# Patient Record
Sex: Male | Born: 1965 | Race: White | Hispanic: No | Marital: Single | Smoking: Former smoker
Health system: Southern US, Community
[De-identification: ages and names within clinical notes are randomized; demographics above are authoritative.]

## PROBLEM LIST (undated history)

## (undated) DIAGNOSIS — I10 Essential (primary) hypertension: Secondary | ICD-10-CM

## (undated) DIAGNOSIS — J439 Emphysema, unspecified: Secondary | ICD-10-CM

## (undated) DIAGNOSIS — G4730 Sleep apnea, unspecified: Secondary | ICD-10-CM

## (undated) DIAGNOSIS — E785 Hyperlipidemia, unspecified: Secondary | ICD-10-CM

## (undated) DIAGNOSIS — I69320 Aphasia following cerebral infarction: Secondary | ICD-10-CM

## (undated) DIAGNOSIS — E119 Type 2 diabetes mellitus without complications: Secondary | ICD-10-CM

## (undated) DIAGNOSIS — I63512 Cerebral infarction due to unspecified occlusion or stenosis of left middle cerebral artery: Secondary | ICD-10-CM

## (undated) DIAGNOSIS — I6609 Occlusion and stenosis of unspecified middle cerebral artery: Secondary | ICD-10-CM

## (undated) HISTORY — DX: Hyperlipidemia, unspecified: E78.5

## (undated) HISTORY — DX: Type 2 diabetes mellitus without complications: E11.9

## (undated) HISTORY — PX: BACK SURGERY: SHX140

## (undated) HISTORY — PX: OTHER SURGICAL HISTORY: SHX169

## (undated) HISTORY — DX: Essential (primary) hypertension: I10

---

## 2021-05-07 ENCOUNTER — Emergency Department (HOSPITAL_COMMUNITY): Payer: BC Managed Care – PPO | Admitting: Anesthesiology

## 2021-05-07 ENCOUNTER — Emergency Department (HOSPITAL_COMMUNITY): Payer: BC Managed Care – PPO

## 2021-05-07 ENCOUNTER — Inpatient Hospital Stay (HOSPITAL_COMMUNITY)
Admission: EM | Admit: 2021-05-07 | Discharge: 2021-05-12 | DRG: 023 | Disposition: A | Discharge status: Rehabilitation Facility | Payer: BC Managed Care – PPO | Attending: Neurology | Admitting: Neurology

## 2021-05-07 ENCOUNTER — Encounter (HOSPITAL_COMMUNITY)
Admission: EM | Disposition: A | Discharge status: Rehabilitation Facility | Payer: Self-pay | Source: Home / Self Care | Attending: Neurology

## 2021-05-07 ENCOUNTER — Inpatient Hospital Stay (HOSPITAL_COMMUNITY): Payer: BC Managed Care – PPO

## 2021-05-07 ENCOUNTER — Encounter (HOSPITAL_COMMUNITY): Payer: Self-pay | Admitting: Neurology

## 2021-05-07 DIAGNOSIS — E876 Hypokalemia: Secondary | ICD-10-CM | POA: Diagnosis not present

## 2021-05-07 DIAGNOSIS — J159 Unspecified bacterial pneumonia: Secondary | ICD-10-CM | POA: Diagnosis not present

## 2021-05-07 DIAGNOSIS — Z6834 Body mass index (BMI) 34.0-34.9, adult: Secondary | ICD-10-CM

## 2021-05-07 DIAGNOSIS — N179 Acute kidney failure, unspecified: Secondary | ICD-10-CM | POA: Diagnosis not present

## 2021-05-07 DIAGNOSIS — Z20822 Contact with and (suspected) exposure to covid-19: Secondary | ICD-10-CM | POA: Diagnosis present

## 2021-05-07 DIAGNOSIS — E785 Hyperlipidemia, unspecified: Secondary | ICD-10-CM | POA: Diagnosis present

## 2021-05-07 DIAGNOSIS — Z79899 Other long term (current) drug therapy: Secondary | ICD-10-CM | POA: Diagnosis not present

## 2021-05-07 DIAGNOSIS — H547 Unspecified visual loss: Secondary | ICD-10-CM | POA: Diagnosis present

## 2021-05-07 DIAGNOSIS — E1165 Type 2 diabetes mellitus with hyperglycemia: Secondary | ICD-10-CM | POA: Diagnosis present

## 2021-05-07 DIAGNOSIS — I959 Hypotension, unspecified: Secondary | ICD-10-CM | POA: Diagnosis present

## 2021-05-07 DIAGNOSIS — I69351 Hemiplegia and hemiparesis following cerebral infarction affecting right dominant side: Secondary | ICD-10-CM | POA: Diagnosis not present

## 2021-05-07 DIAGNOSIS — I10 Essential (primary) hypertension: Secondary | ICD-10-CM | POA: Diagnosis present

## 2021-05-07 DIAGNOSIS — R29810 Facial weakness: Secondary | ICD-10-CM | POA: Diagnosis present

## 2021-05-07 DIAGNOSIS — E669 Obesity, unspecified: Secondary | ICD-10-CM | POA: Diagnosis present

## 2021-05-07 DIAGNOSIS — R1310 Dysphagia, unspecified: Secondary | ICD-10-CM | POA: Diagnosis present

## 2021-05-07 DIAGNOSIS — I63512 Cerebral infarction due to unspecified occlusion or stenosis of left middle cerebral artery: Secondary | ICD-10-CM | POA: Diagnosis present

## 2021-05-07 DIAGNOSIS — E8770 Fluid overload, unspecified: Secondary | ICD-10-CM | POA: Diagnosis present

## 2021-05-07 DIAGNOSIS — I63312 Cerebral infarction due to thrombosis of left middle cerebral artery: Secondary | ICD-10-CM | POA: Diagnosis present

## 2021-05-07 DIAGNOSIS — J9602 Acute respiratory failure with hypercapnia: Secondary | ICD-10-CM | POA: Diagnosis present

## 2021-05-07 DIAGNOSIS — R29706 NIHSS score 6: Secondary | ICD-10-CM | POA: Diagnosis present

## 2021-05-07 DIAGNOSIS — G936 Cerebral edema: Secondary | ICD-10-CM | POA: Diagnosis present

## 2021-05-07 DIAGNOSIS — I6602 Occlusion and stenosis of left middle cerebral artery: Secondary | ICD-10-CM | POA: Diagnosis not present

## 2021-05-07 DIAGNOSIS — J811 Chronic pulmonary edema: Secondary | ICD-10-CM

## 2021-05-07 DIAGNOSIS — J810 Acute pulmonary edema: Secondary | ICD-10-CM | POA: Diagnosis not present

## 2021-05-07 DIAGNOSIS — F17210 Nicotine dependence, cigarettes, uncomplicated: Secondary | ICD-10-CM | POA: Diagnosis present

## 2021-05-07 DIAGNOSIS — J9601 Acute respiratory failure with hypoxia: Secondary | ICD-10-CM | POA: Diagnosis present

## 2021-05-07 DIAGNOSIS — R4701 Aphasia: Secondary | ICD-10-CM | POA: Diagnosis present

## 2021-05-07 DIAGNOSIS — Z978 Presence of other specified devices: Secondary | ICD-10-CM

## 2021-05-07 DIAGNOSIS — J189 Pneumonia, unspecified organism: Secondary | ICD-10-CM | POA: Diagnosis not present

## 2021-05-07 DIAGNOSIS — R7989 Other specified abnormal findings of blood chemistry: Secondary | ICD-10-CM | POA: Diagnosis not present

## 2021-05-07 DIAGNOSIS — I639 Cerebral infarction, unspecified: Secondary | ICD-10-CM | POA: Diagnosis present

## 2021-05-07 DIAGNOSIS — I609 Nontraumatic subarachnoid hemorrhage, unspecified: Secondary | ICD-10-CM | POA: Diagnosis present

## 2021-05-07 HISTORY — PX: IR PERCUTANEOUS ART THROMBECTOMY/INFUSION INTRACRANIAL INC DIAG ANGIO: IMG6087

## 2021-05-07 HISTORY — PX: IR CT HEAD LTD: IMG2386

## 2021-05-07 HISTORY — PX: RADIOLOGY WITH ANESTHESIA: SHX6223

## 2021-05-07 LAB — I-STAT CHEM 8, ED
BUN: 18 mg/dL (ref 6–20)
Calcium, Ion: 1.04 mmol/L — ABNORMAL LOW (ref 1.15–1.40)
Chloride: 103 mmol/L (ref 98–111)
Creatinine, Ser: 1 mg/dL (ref 0.61–1.24)
Glucose, Bld: 187 mg/dL — ABNORMAL HIGH (ref 70–99)
HCT: 50 % (ref 39.0–52.0)
Hemoglobin: 17 g/dL (ref 13.0–17.0)
Potassium: 3.6 mmol/L (ref 3.5–5.1)
Sodium: 138 mmol/L (ref 135–145)
TCO2: 23 mmol/L (ref 22–32)

## 2021-05-07 LAB — COMPREHENSIVE METABOLIC PANEL
ALT: 21 U/L (ref 0–44)
AST: 15 U/L (ref 15–41)
Albumin: 3.6 g/dL (ref 3.5–5.0)
Alkaline Phosphatase: 95 U/L (ref 38–126)
Anion gap: 11 (ref 5–15)
BUN: 16 mg/dL (ref 6–20)
CO2: 22 mmol/L (ref 22–32)
Calcium: 9 mg/dL (ref 8.9–10.3)
Chloride: 103 mmol/L (ref 98–111)
Creatinine, Ser: 1.21 mg/dL (ref 0.61–1.24)
GFR, Estimated: >60 mL/min (ref >60)
Glucose, Bld: 185 mg/dL — ABNORMAL HIGH (ref 70–99)
Potassium: 3.7 mmol/L (ref 3.5–5.1)
Sodium: 136 mmol/L (ref 135–145)
Total Bilirubin: 0.6 mg/dL (ref 0.3–1.2)
Total Protein: 7.5 g/dL (ref 6.5–8.1)

## 2021-05-07 LAB — HIV ANTIBODY (ROUTINE TESTING W REFLEX): HIV Screen 4th Generation wRfx: NONREACTIVE

## 2021-05-07 LAB — POCT I-STAT 7, (LYTES, BLD GAS, ICA,H+H)
Acid-base deficit: 3 mmol/L — ABNORMAL HIGH (ref 0.0–2.0)
Bicarbonate: 24.7 mmol/L (ref 20.0–28.0)
Calcium, Ion: 1.15 mmol/L (ref 1.15–1.40)
HCT: 47 % (ref 39.0–52.0)
Hemoglobin: 16 g/dL (ref 13.0–17.0)
O2 Saturation: 97 %
Potassium: 3.5 mmol/L (ref 3.5–5.1)
Sodium: 140 mmol/L (ref 135–145)
TCO2: 26 mmol/L (ref 22–32)
pCO2 arterial: 51.6 mmHg — ABNORMAL HIGH (ref 32.0–48.0)
pH, Arterial: 7.288 — ABNORMAL LOW (ref 7.350–7.450)
pO2, Arterial: 109 mmHg — ABNORMAL HIGH (ref 83.0–108.0)

## 2021-05-07 LAB — DIFFERENTIAL
Abs Immature Granulocytes: 0.04 10*3/uL (ref 0.00–0.07)
Basophils Absolute: 0 10*3/uL (ref 0.0–0.1)
Basophils Relative: 0 %
Eosinophils Absolute: 0 10*3/uL (ref 0.0–0.5)
Eosinophils Relative: 0 %
Immature Granulocytes: 0 %
Lymphocytes Relative: 12 %
Lymphs Abs: 1.3 10*3/uL (ref 0.7–4.0)
Monocytes Absolute: 0.8 10*3/uL (ref 0.1–1.0)
Monocytes Relative: 7 %
Neutro Abs: 8.9 10*3/uL — ABNORMAL HIGH (ref 1.7–7.7)
Neutrophils Relative %: 81 %

## 2021-05-07 LAB — CBC
HCT: 50.7 % (ref 39.0–52.0)
HCT: 44.6 % (ref 39.0–52.0)
Hemoglobin: 16.9 g/dL (ref 13.0–17.0)
Hemoglobin: 14.8 g/dL (ref 13.0–17.0)
MCH: 28.1 pg (ref 26.0–34.0)
MCH: 28.1 pg (ref 26.0–34.0)
MCHC: 33.3 g/dL (ref 30.0–36.0)
MCHC: 33.2 g/dL (ref 30.0–36.0)
MCV: 84.2 fL (ref 80.0–100.0)
MCV: 84.8 fL (ref 80.0–100.0)
Platelets: 270 10*3/uL (ref 150–400)
Platelets: 276 10*3/uL (ref 150–400)
RBC: 6.02 MIL/uL — ABNORMAL HIGH (ref 4.22–5.81)
RBC: 5.26 MIL/uL (ref 4.22–5.81)
RDW: 13.3 % (ref 11.5–15.5)
RDW: 13.4 % (ref 11.5–15.5)
WBC: 11.1 10*3/uL — ABNORMAL HIGH (ref 4.0–10.5)
WBC: 10.5 10*3/uL (ref 4.0–10.5)
nRBC: 0 % (ref 0.0–0.2)
nRBC: 0 % (ref 0.0–0.2)

## 2021-05-07 LAB — URINALYSIS, ROUTINE W REFLEX MICROSCOPIC
Bacteria, UA: NONE SEEN
Bilirubin Urine: NEGATIVE
Glucose, UA: 50 mg/dL — AB
Ketones, ur: NEGATIVE mg/dL
Leukocytes,Ua: NEGATIVE
Nitrite: NEGATIVE
Protein, ur: 30 mg/dL — AB
Specific Gravity, Urine: 1.031 — ABNORMAL HIGH (ref 1.005–1.030)
pH: 6 (ref 5.0–8.0)

## 2021-05-07 LAB — GLUCOSE, CAPILLARY
Glucose-Capillary: 195 mg/dL — ABNORMAL HIGH (ref 70–99)
Glucose-Capillary: 157 mg/dL — ABNORMAL HIGH (ref 70–99)
Glucose-Capillary: 117 mg/dL — ABNORMAL HIGH (ref 70–99)

## 2021-05-07 LAB — RESP PANEL BY RT-PCR (FLU A&B, COVID) ARPGX2
Influenza A by PCR: NEGATIVE
Influenza B by PCR: NEGATIVE
SARS Coronavirus 2 by RT PCR: NEGATIVE

## 2021-05-07 LAB — RAPID URINE DRUG SCREEN, HOSP PERFORMED
Amphetamines: NOT DETECTED
Barbiturates: NOT DETECTED
Benzodiazepines: NOT DETECTED
Cocaine: NOT DETECTED
Opiates: NOT DETECTED
Tetrahydrocannabinol: NOT DETECTED

## 2021-05-07 LAB — MRSA PCR SCREENING: MRSA by PCR: NEGATIVE

## 2021-05-07 LAB — PROTIME-INR
INR: 1.1 (ref 0.8–1.2)
Prothrombin Time: 13.9 seconds (ref 11.4–15.2)

## 2021-05-07 LAB — APTT: aPTT: 34 seconds (ref 24–36)

## 2021-05-07 LAB — CBG MONITORING, ED: Glucose-Capillary: 202 mg/dL — ABNORMAL HIGH (ref 70–99)

## 2021-05-07 LAB — ETHANOL: Alcohol, Ethyl (B): <10 mg/dL (ref <10)

## 2021-05-07 SURGERY — IR WITH ANESTHESIA
Anesthesia: General

## 2021-05-07 MED ORDER — SUGAMMADEX SODIUM 200 MG/2ML IV SOLN
INTRAVENOUS | Status: DC | PRN
  Administered 2021-05-07: 200 mg via INTRAVENOUS

## 2021-05-07 MED ORDER — FENTANYL CITRATE (PF) 100 MCG/2ML IJ SOLN
50.0000 ug | INTRAMUSCULAR | Status: DC | PRN
  Administered 2021-05-07: 100 ug via INTRAVENOUS

## 2021-05-07 MED ORDER — CLEVIDIPINE BUTYRATE 0.5 MG/ML IV EMUL
INTRAVENOUS | Status: AC
  Filled 2021-05-07: qty 50

## 2021-05-07 MED ORDER — STROKE: EARLY STAGES OF RECOVERY BOOK
Freq: Once | Status: AC
  Filled 2021-05-07: qty 1

## 2021-05-07 MED ORDER — CLEVIDIPINE BUTYRATE 0.5 MG/ML IV EMUL
INTRAVENOUS | Status: DC | PRN
  Administered 2021-05-07: 13 mg/h via INTRAVENOUS
  Administered 2021-05-07: 2 mg/h via INTRAVENOUS

## 2021-05-07 MED ORDER — ACETAMINOPHEN 325 MG PO TABS: 650.0000 mg | ORAL_TABLET | ORAL | Status: DC | PRN

## 2021-05-07 MED ORDER — PROPOFOL 10 MG/ML IV BOLUS
INTRAVENOUS | Status: DC | PRN
  Administered 2021-05-07: 100 mg via INTRAVENOUS
  Administered 2021-05-07: 200 mg via INTRAVENOUS
  Administered 2021-05-07 (×2): 50 mg via INTRAVENOUS

## 2021-05-07 MED ORDER — PROPOFOL 1000 MG/100ML IV EMUL
5.0000 ug/kg/min | INTRAVENOUS | Status: DC
Start: 2021-05-07 — End: 2021-05-08
  Administered 2021-05-07 – 2021-05-08 (×3): 40 ug/kg/min via INTRAVENOUS
  Administered 2021-05-08: 30 ug/kg/min via INTRAVENOUS
  Administered 2021-05-08: 40 ug/kg/min via INTRAVENOUS
  Filled 2021-05-07 (×4): qty 100

## 2021-05-07 MED ORDER — CHLORHEXIDINE GLUCONATE CLOTH 2 % EX PADS
6.0000 | MEDICATED_PAD | Freq: Every day | CUTANEOUS | Status: DC
  Administered 2021-05-09 – 2021-05-11 (×3): 6 via TOPICAL

## 2021-05-07 MED ORDER — FENTANYL CITRATE (PF) 100 MCG/2ML IJ SOLN
INTRAMUSCULAR | Status: AC
  Filled 2021-05-07: qty 2

## 2021-05-07 MED ORDER — CEFAZOLIN SODIUM-DEXTROSE 2-4 GM/100ML-% IV SOLN
INTRAVENOUS | Status: AC
  Filled 2021-05-07: qty 100

## 2021-05-07 MED ORDER — ACETAMINOPHEN 160 MG/5ML PO SOLN: 650.0000 mg | ORAL | Status: DC | PRN

## 2021-05-07 MED ORDER — PHENYLEPHRINE HCL-NACL 10-0.9 MG/250ML-% IV SOLN
25.0000 ug/min | INTRAVENOUS | Status: DC
  Administered 2021-05-07: 25 ug/min via INTRAVENOUS
  Administered 2021-05-08: 40 ug/min via INTRAVENOUS
  Administered 2021-05-08: 15 ug/min via INTRAVENOUS
  Administered 2021-05-08: 25 ug/min via INTRAVENOUS
  Filled 2021-05-07 (×3): qty 250

## 2021-05-07 MED ORDER — CHLORHEXIDINE GLUCONATE 0.12% ORAL RINSE (MEDLINE KIT)
15.0000 mL | Freq: Two times a day (BID) | OROMUCOSAL | Status: DC
  Administered 2021-05-08: 15 mL via OROMUCOSAL

## 2021-05-07 MED ORDER — PROPOFOL 1000 MG/100ML IV EMUL: 0.0000 ug/kg/min | INTRAVENOUS | Status: DC

## 2021-05-07 MED ORDER — FENTANYL 2500MCG IN NS 250ML (10MCG/ML) PREMIX INFUSION
0.0000 ug/h | INTRAVENOUS | Status: DC
  Administered 2021-05-07 – 2021-05-08 (×2): 150 ug/h via INTRAVENOUS
  Filled 2021-05-07: qty 250

## 2021-05-07 MED ORDER — FENTANYL CITRATE (PF) 250 MCG/5ML IJ SOLN
INTRAMUSCULAR | Status: DC | PRN
  Administered 2021-05-07 (×2): 50 ug via INTRAVENOUS

## 2021-05-07 MED ORDER — ACETAMINOPHEN 650 MG RE SUPP: 650.0000 mg | RECTAL | Status: DC | PRN

## 2021-05-07 MED ORDER — CHLORHEXIDINE GLUCONATE 0.12% ORAL RINSE (MEDLINE KIT)
15.0000 mL | Freq: Two times a day (BID) | OROMUCOSAL | Status: DC
  Administered 2021-05-07 – 2021-05-09 (×4): 15 mL via OROMUCOSAL

## 2021-05-07 MED ORDER — SODIUM CHLORIDE 0.9 % IV SOLN: 250.0000 mL | INTRAVENOUS | Status: DC

## 2021-05-07 MED ORDER — SODIUM CHLORIDE 0.9 % IV SOLN: INTRAVENOUS | Status: DC

## 2021-05-07 MED ORDER — CANGRELOR TETRASODIUM 50 MG IV SOLR
INTRAVENOUS | Status: AC
  Filled 2021-05-07: qty 50

## 2021-05-07 MED ORDER — PROPOFOL 1000 MG/100ML IV EMUL
5.0000 ug/kg/min | INTRAVENOUS | Status: DC
  Administered 2021-05-07: 80 ug/kg/min via INTRAVENOUS

## 2021-05-07 MED ORDER — NITROGLYCERIN 1 MG/10 ML FOR IR/CATH LAB
INTRA_ARTERIAL | Status: AC
  Administered 2021-05-07 (×2): 25 ug via INTRA_ARTERIAL
  Filled 2021-05-07: qty 10

## 2021-05-07 MED ORDER — SUCCINYLCHOLINE CHLORIDE 200 MG/10ML IV SOSY
PREFILLED_SYRINGE | INTRAVENOUS | Status: DC | PRN
  Administered 2021-05-07: 120 mg via INTRAVENOUS
  Administered 2021-05-07: 140 mg via INTRAVENOUS

## 2021-05-07 MED ORDER — ONDANSETRON HCL 4 MG/2ML IJ SOLN
INTRAMUSCULAR | Status: DC | PRN
  Administered 2021-05-07: 4 mg via INTRAVENOUS

## 2021-05-07 MED ORDER — ASPIRIN 81 MG PO CHEW
CHEWABLE_TABLET | ORAL | Status: AC
  Filled 2021-05-07: qty 1

## 2021-05-07 MED ORDER — IOHEXOL 240 MG/ML SOLN
INTRAMUSCULAR | Status: AC
  Filled 2021-05-07: qty 100

## 2021-05-07 MED ORDER — LABETALOL HCL 5 MG/ML IV SOLN: 20.0000 mg | INTRAVENOUS | Status: DC | PRN

## 2021-05-07 MED ORDER — ENOXAPARIN SODIUM 40 MG/0.4ML IJ SOSY
40.0000 mg | PREFILLED_SYRINGE | INTRAMUSCULAR | Status: DC
  Administered 2021-05-09 – 2021-05-11 (×3): 40 mg via SUBCUTANEOUS
  Filled 2021-05-07 (×3): qty 0.4

## 2021-05-07 MED ORDER — ROCURONIUM BROMIDE 10 MG/ML (PF) SYRINGE
PREFILLED_SYRINGE | INTRAVENOUS | Status: DC | PRN
  Administered 2021-05-07 (×2): 50 mg via INTRAVENOUS

## 2021-05-07 MED ORDER — VERAPAMIL HCL 2.5 MG/ML IV SOLN
INTRAVENOUS | Status: AC
  Filled 2021-05-07: qty 2

## 2021-05-07 MED ORDER — ORAL CARE MOUTH RINSE
15.0000 mL | OROMUCOSAL | Status: DC
  Administered 2021-05-07 – 2021-05-09 (×18): 15 mL via OROMUCOSAL

## 2021-05-07 MED ORDER — POLYETHYLENE GLYCOL 3350 17 G PO PACK: 17.0000 g | PACK | Freq: Every day | ORAL | Status: DC

## 2021-05-07 MED ORDER — SODIUM CHLORIDE 0.9 % IV SOLN: INTRAVENOUS | Status: DC | PRN

## 2021-05-07 MED ORDER — CLEVIDIPINE BUTYRATE 0.5 MG/ML IV EMUL
0.0000 mg/h | INTRAVENOUS | Status: DC
  Administered 2021-05-07: 10 mg/h via INTRAVENOUS
  Administered 2021-05-08: 2 mg/h via INTRAVENOUS
  Administered 2021-05-09: 10 mg/h via INTRAVENOUS
  Administered 2021-05-09: 4 mg/h via INTRAVENOUS
  Administered 2021-05-10 (×2): 5 mg/h via INTRAVENOUS
  Administered 2021-05-10: 12 mg/h via INTRAVENOUS
  Administered 2021-05-10: 8 mg/h via INTRAVENOUS
  Filled 2021-05-07 (×8): qty 50

## 2021-05-07 MED ORDER — IOHEXOL 350 MG/ML SOLN
100.0000 mL | Freq: Once | INTRAVENOUS | Status: AC | PRN
  Administered 2021-05-07: 100 mL via INTRAVENOUS

## 2021-05-07 MED ORDER — PHENYLEPHRINE 40 MCG/ML (10ML) SYRINGE FOR IV PUSH (FOR BLOOD PRESSURE SUPPORT)
PREFILLED_SYRINGE | INTRAVENOUS | Status: DC | PRN
  Administered 2021-05-07: 80 ug via INTRAVENOUS

## 2021-05-07 MED ORDER — PANTOPRAZOLE SODIUM 40 MG IV SOLR
40.0000 mg | Freq: Every day | INTRAVENOUS | Status: DC
  Administered 2021-05-08: 40 mg via INTRAVENOUS
  Filled 2021-05-07: qty 40

## 2021-05-07 MED ORDER — SENNOSIDES-DOCUSATE SODIUM 8.6-50 MG PO TABS: 1.0000 | ORAL_TABLET | Freq: Every evening | ORAL | Status: DC | PRN

## 2021-05-07 MED ORDER — ORAL CARE MOUTH RINSE: 15.0000 mL | OROMUCOSAL | Status: DC

## 2021-05-07 MED ORDER — ACETAMINOPHEN 325 MG PO TABS
650.0000 mg | ORAL_TABLET | ORAL | Status: DC | PRN
  Administered 2021-05-10 – 2021-05-11 (×3): 650 mg via ORAL
  Filled 2021-05-07 (×3): qty 2

## 2021-05-07 MED ORDER — CLEVIDIPINE BUTYRATE 0.5 MG/ML IV EMUL: 0.0000 mg/h | INTRAVENOUS | Status: DC

## 2021-05-07 MED ORDER — PROPOFOL 1000 MG/100ML IV EMUL
INTRAVENOUS | Status: AC
  Filled 2021-05-07: qty 100

## 2021-05-07 MED ORDER — TICAGRELOR 90 MG PO TABS
ORAL_TABLET | ORAL | Status: AC
  Filled 2021-05-07: qty 2

## 2021-05-07 MED ORDER — DOCUSATE SODIUM 50 MG/5ML PO LIQD: 100.0000 mg | Freq: Two times a day (BID) | ORAL | Status: DC

## 2021-05-07 MED ORDER — SODIUM CHLORIDE 0.9 % IV BOLUS
500.0000 mL | Freq: Once | INTRAVENOUS | Status: AC
  Administered 2021-05-07: 500 mL via INTRAVENOUS

## 2021-05-07 MED ORDER — LABETALOL HCL 5 MG/ML IV SOLN
INTRAVENOUS | Status: AC
  Filled 2021-05-07: qty 4

## 2021-05-07 MED ORDER — EPTIFIBATIDE 20 MG/10ML IV SOLN
INTRAVENOUS | Status: AC
  Filled 2021-05-07: qty 10

## 2021-05-07 MED ORDER — CLOPIDOGREL BISULFATE 300 MG PO TABS
ORAL_TABLET | ORAL | Status: AC
  Filled 2021-05-07: qty 1

## 2021-05-07 MED ORDER — FENTANYL CITRATE (PF) 100 MCG/2ML IJ SOLN: 50.0000 ug | INTRAMUSCULAR | Status: DC | PRN

## 2021-05-07 MED ORDER — CEFAZOLIN SODIUM-DEXTROSE 2-3 GM-%(50ML) IV SOLR
INTRAVENOUS | Status: DC | PRN
  Administered 2021-05-07: 2 g via INTRAVENOUS

## 2021-05-07 MED ORDER — TIROFIBAN HCL IN NACL 5-0.9 MG/100ML-% IV SOLN
INTRAVENOUS | Status: AC
  Filled 2021-05-07: qty 100

## 2021-05-07 MED ORDER — IOHEXOL 240 MG/ML SOLN
INTRAMUSCULAR | Status: AC
  Administered 2021-05-07: 100 mL
  Filled 2021-05-07: qty 200
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
## 2021-05-07 NOTE — Progress Notes (Signed)
Patient ID: Peter Lucas, male   DOB: [birth year], 55 y.o.   MRN: [id number] RT H M MRS 0 LSW 5am New onset bof global aphasia.  CT brain No ICH ASPECTS7 CTA occlude LT MCA inf division branch in the M2 /M3 region . Endovascular treatment discussed with daughter given mismatch of 49ml in an eloquent area with dense global aphasia and vessel diameterof f approx 1.7 mm  Procedure .reasons alternatives reviewed. Risks of ICH of 10 % ,worsening neuro function, death and inability to revascularize reviewed. Daughter expressed understanding and provided consent to proceed. S.Crystale Giannattasio MD

## 2021-05-07 NOTE — ED Triage Notes (Signed)
Pt arrives via [facility] from work. Worked called EMS reporting AMS, redness, and shaking. EMS arrives and assesses expressive and receptive aphasia as well as inability to recognize coworkers. LKW at 0500 when pt drove to work. CBG 195.

## 2021-05-07 NOTE — ED Notes (Signed)
Informed consent obtained with neurologist and IR, witnessed by this RN.

## 2021-05-07 NOTE — ED Provider Notes (Signed)
[facility]   CSN: [id number] Arrival date & time: 05/07/21  1107     History No chief complaint on file.   Peter Lucas is a 55 y.o. male.  55 year old male with no known past medical history but does not follow with PCP who presents with aphasia.  Patient woke up at 5 AM this morning and went to work.  When there, he was noted to have speech problems and EMS was called.  He had aphasia for EMS and code stroke was called in route.  LEVEL 5 CAVEAT DUE TO ACUITY OF CONDITION, PATIENT APHASIC, AND NEED FOR EMERGENT INTERVENTION  The history is provided by the EMS personnel.       No past medical history on file.  There are no problems to display for this patient.   PMH: denies     No family history on file.     Home Medications Prior to Admission medications   Not on File    Allergies    Patient has no allergy information on record.  Review of Systems   Review of Systems  Unable to perform ROS: Patient nonverbal    Physical Exam Updated Vital Signs There were no vitals taken for this visit.  Physical Exam Vitals and nursing note reviewed.  Constitutional:      General: He is not in acute distress.    Appearance: Normal appearance.  HENT:     Head: Normocephalic and atraumatic.  Pulmonary:     Effort: Pulmonary effort is normal.  Abdominal:     General: Abdomen is flat.  Skin:    Findings: No rash.  Neurological:     Mental Status: He is alert.     Comments: Alert, severe expressive aphasia, no obvious facial droop, moving all 4 extremities equally  Psychiatric:     Comments: Calm, cooperative     ED Results / Procedures / Treatments   Labs (all labs ordered are listed, but only abnormal results are displayed) Labs Reviewed  CBC - Abnormal; Notable for the following components:      Result Value   WBC 11.1 (*)    RBC 6.02 (*)    All other components within normal limits  DIFFERENTIAL  - Abnormal; Notable for the following components:   Neutro Abs 8.9 (*)    All other components within normal limits  CBG MONITORING, ED - Abnormal; Notable for the following components:   Glucose-Capillary 202 (*)    All other components within normal limits  I-STAT CHEM 8, ED - Abnormal; Notable for the following components:   Glucose, Bld 187 (*)    Calcium, Ion 1.04 (*)    All other components within normal limits  RESP PANEL BY RT-PCR (FLU A&B, COVID) ARPGX2  PROTIME-INR  APTT  ETHANOL  COMPREHENSIVE METABOLIC PANEL  RAPID URINE DRUG SCREEN, HOSP PERFORMED  URINALYSIS, ROUTINE W REFLEX MICROSCOPIC    EKG None  Radiology CT CEREBRAL PERFUSION W CONTRAST  Result Date: 05/07/2021 CLINICAL DATA:  55 year old male code stroke presentation with posterior left MCA infarct visible on plain CT. EXAM: CT ANGIOGRAPHY HEAD AND NECK CT PERFUSION BRAIN TECHNIQUE: Multidetector CT imaging of the head and neck was performed using the standard protocol during bolus administration of intravenous contrast. Multiplanar CT image reconstructions and MIPs were obtained to evaluate the vascular anatomy. Carotid stenosis measurements (when applicable) are obtained utilizing NASCET criteria, using the distal internal carotid diameter as the denominator. Multiphase CT imaging  of the brain was performed following IV bolus contrast injection. Subsequent parametric perfusion maps were calculated using RAPID software. CONTRAST:  OMNIPAQUE IOHEXOL 350 MG/ML SOLN COMPARISON:  Plain head CT 1116 hours. FINDINGS: CT Brain Perfusion Findings: ASPECTS: 7 CBF (<30%) Volume: 62mL Perfusion (Tmax>6.0s) volume: 50mL.  Hypoperfusion index 0.5 Mismatch Volume: 57mL, mismatch ratio 1.7 Infarction Location:Posterior left MCA CTA NECK Skeleton: Carious posterior dentition. Minimal cervical spine degeneration. No acute osseous abnormality identified. Upper chest: Negative. Other neck: Postinflammatory dystrophic calcifications  of the palatine tonsils. Left maxillary sinus disease. Otherwise negative. Aortic arch: Mildly tortuous aortic arch. Three vessel arch configuration without arch atherosclerosis. Right carotid system: Mildly tortuous brachiocephalic artery. Negative right CCA and right carotid bifurcation. Tortuous right ICA distal to the bulb but no stenosis to the skull base. Left carotid system: Negative left CCA. Minimal calcified plaque at the left carotid bifurcation. Tortuous left ICA below the skull base without stenosis. Vertebral arteries: Normal proximal right subclavian artery and right vertebral artery origin. The right vertebral is patent and normal to the skull base. Normal proximal left subclavian artery and left vertebral artery origin. The left vertebral is patent and normal to the skull base. CTA HEAD Posterior circulation: Codominant vertebral arteries. Patent V4 segments with no stenosis on the left, but there is a severe stenosis at the right vertebrobasilar junction best seen on series 9, image 29. Normal PICA origins. Patent basilar artery with mild irregularity but no significant stenosis. Patent SCA and PCA origins. Posterior communicating arteries are diminutive or absent. Bilateral PCA irregularity in keeping with intracranial atherosclerosis. Moderate to severe stenosis of the right P1/P2 junction, and mild left PCA stenoses on series 8, image 17. Anterior circulation: Both ICA siphons are patent. On the left there is mild calcified plaque with no significant siphon stenosis. Normal left ophthalmic artery origin. Patent left ICA terminus. Mild right siphon calcified plaque without stenosis. Normal right ophthalmic artery origin and right ICA terminus. Patent MCA and ACA origins. Mild stenosis at the right A1 origin on series 9, image 24. Anterior communicating artery and ACA A2 segments are within normal limits. There is a moderate to severe distal left A2/pericallosal stenosis on series 10, image 23.  Right MCA M1 and right MCA trifurcation are patent without stenosis. Right MCA branches are within normal limits. Left MCA M1 and left MCA trifurcation are patent without stenosis. There is occlusion of a posterior right MCA branch, distal M2 or proximal M3 seen on series 7, images 155-157. This is also on series 10, image 31. The other left MCA branches appear patent with mild irregularity. Venous sinuses: Early contrast timing, not well evaluated. Anatomic variants: None. Review of the MIP images confirms the above findings Left MCA findings reviewed in person Dr. Erick Blinks on 05/07/2021 at 1135 hours, and discussed again by telephone at 11:45 . IMPRESSION: 1. Positive for occlusion of a posterior Left MCA branch, distal M2 versus proximal M3. Associated posterior left MCA territory infarct core of 42 mL and 70 mL estimated penumbra by CTP (mismatch of 28 mL). This was discussed with Dr. Erick Blinks on 05/07/2021 beginning at 1135 hours. 2. 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 - moderate to severe stenoses of the Right PCA P1/P2 junction. 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 Electronically Signed   By: Peter Lucas M.D.   On: 05/07/2021 11:57   CT HEAD  CODE STROKE WO CONTRAST  Result Date: 05/07/2021 CLINICAL DATA:  Code stroke.  55 year old male neurologic deficit. EXAM: CT HEAD WITHOUT CONTRAST TECHNIQUE: Contiguous axial images were obtained from the base of the skull through the vertex without intravenous contrast. COMPARISON:  None. FINDINGS: Brain: Confluent cytotoxic edema in the posterior left MCA territory affecting the posterosuperior temporal gyrus through the left parietal lobe. No hemorrhagic transformation. No significant mass effect. The insula, internal capsule and deep gray matter nuclei appear spared. Gray-white matter differentiation  elsewhere within normal limits. No ventriculomegaly. Normal basilar cisterns. Vascular: Calcified atherosclerosis at the skull base. Suspicious hyperdensity in a posterior left MCA branch along the anterior margin of the cytotoxic edema series 2, image 17 and series 6, image 45. But no more proximal suspicious vessel hyperdensity is identified. Skull: Negative. Sinuses/Orbits: Subtotal opacification of the left maxillary sinus due to a mucosal thickening and/or retention cyst. Other Visualized paranasal sinuses and mastoids are clear. Other: Visualized orbits and scalp soft tissues are within normal limits. ASPECTS [facility] Stroke Program Early CT Score) - Ganglionic level infarction (caudate, lentiform nuclei, internal capsule, insula, M1-M3 cortex): 6 (abnormal M3) - Supraganglionic infarction (M4-M6 cortex): 1 Total score (0-10 with 10 being normal): 7 IMPRESSION: 1. 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 ASPECTS 7. 2. No hemorrhagic transformation or mass effect. Suspicion of a posterior left MCA branch occlusion at the posterior sylvian fissure. Study discussed by telephone with Dr. Erick Blinks on 05/07/2021 at 11:20 . Electronically Signed   By: Peter Lucas M.D.   On: 05/07/2021 11:23   CT ANGIO HEAD CODE STROKE  Result Date: 05/07/2021 CLINICAL DATA:  55 year old male code stroke presentation with posterior left MCA infarct visible on plain CT. EXAM: CT ANGIOGRAPHY HEAD AND NECK CT PERFUSION BRAIN TECHNIQUE: Multidetector CT imaging of the head and neck was performed using the standard protocol during bolus administration of intravenous contrast. Multiplanar CT image reconstructions and MIPs were obtained to evaluate the vascular anatomy. Carotid stenosis measurements (when applicable) are obtained utilizing NASCET criteria, using the distal internal carotid diameter as the denominator. Multiphase CT imaging of the brain was performed following IV bolus contrast injection.  Subsequent parametric perfusion maps were calculated using RAPID software. CONTRAST:  OMNIPAQUE IOHEXOL 350 MG/ML SOLN COMPARISON:  Plain head CT 1116 hours. FINDINGS: CT Brain Perfusion Findings: ASPECTS: 7 CBF (<30%) Volume: 42mL Perfusion (Tmax>6.0s) volume: 70mL.  Hypoperfusion index 0.5 Mismatch Volume: 28mL, mismatch ratio 1.7 Infarction Location:Posterior left MCA CTA NECK Skeleton: Carious posterior dentition. Minimal cervical spine degeneration. No acute osseous abnormality identified. Upper chest: Negative. Other neck: Postinflammatory dystrophic calcifications of the palatine tonsils. Left maxillary sinus disease. Otherwise negative. Aortic arch: Mildly tortuous aortic arch. Three vessel arch configuration without arch atherosclerosis. Right carotid system: Mildly tortuous brachiocephalic artery. Negative right CCA and right carotid bifurcation. Tortuous right ICA distal to the bulb but no stenosis to the skull base. Left carotid system: Negative left CCA. Minimal calcified plaque at the left carotid bifurcation. Tortuous left ICA below the skull base without stenosis. Vertebral arteries: Normal proximal right subclavian artery and right vertebral artery origin. The right vertebral is patent and normal to the skull base. Normal proximal left subclavian artery and left vertebral artery origin. The left vertebral is patent and normal to the skull base. CTA HEAD Posterior circulation: Codominant vertebral arteries. Patent V4 segments with no stenosis on the left, but there is a severe stenosis at the right vertebrobasilar junction best seen on series 9,  image 29. Normal PICA origins. Patent basilar artery with mild irregularity but no significant stenosis. Patent SCA and PCA origins. Posterior communicating arteries are diminutive or absent. Bilateral PCA irregularity in keeping with intracranial atherosclerosis. Moderate to severe stenosis of the right P1/P2 junction, and mild left PCA stenoses on  series 8, image 17. Anterior circulation: Both ICA siphons are patent. On the left there is mild calcified plaque with no significant siphon stenosis. Normal left ophthalmic artery origin. Patent left ICA terminus. Mild right siphon calcified plaque without stenosis. Normal right ophthalmic artery origin and right ICA terminus. Patent MCA and ACA origins. Mild stenosis at the right A1 origin on series 9, image 24. Anterior communicating artery and ACA A2 segments are within normal limits. There is a moderate to severe distal left A2/pericallosal stenosis on series 10, image 23. Right MCA M1 and right MCA trifurcation are patent without stenosis. Right MCA branches are within normal limits. Left MCA M1 and left MCA trifurcation are patent without stenosis. There is occlusion of a posterior right MCA branch, distal M2 or proximal M3 seen on series 7, images 155-157. This is also on series 10, image 31. The other left MCA branches appear patent with mild irregularity. Venous sinuses: Early contrast timing, not well evaluated. Anatomic variants: None. Review of the MIP images confirms the above findings Left MCA findings reviewed in person Dr. Erick Blinks on 05/07/2021 at 1135 hours, and discussed again by telephone at 11:45 . IMPRESSION: 1. Positive for occlusion of a posterior Left MCA branch, distal M2 versus proximal M3. Associated posterior left MCA territory infarct core of 42 mL and 70 mL estimated penumbra by CTP (mismatch of 28 mL). This was discussed with Dr. Erick Blinks on 05/07/2021 beginning at 1135 hours. 2. 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 - moderate to severe stenoses of the Right PCA P1/P2 junction. 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 Electronically Signed   By: Peter Lucas M.D.   On: 05/07/2021 11:57   CT ANGIO NECK CODE STROKE  Result  Date: 05/07/2021 CLINICAL DATA:  55 year old male code stroke presentation with posterior left MCA infarct visible on plain CT. EXAM: CT ANGIOGRAPHY HEAD AND NECK CT PERFUSION BRAIN TECHNIQUE: Multidetector CT imaging of the head and neck was performed using the standard protocol during bolus administration of intravenous contrast. Multiplanar CT image reconstructions and MIPs were obtained to evaluate the vascular anatomy. Carotid stenosis measurements (when applicable) are obtained utilizing NASCET criteria, using the distal internal carotid diameter as the denominator. Multiphase CT imaging of the brain was performed following IV bolus contrast injection. Subsequent parametric perfusion maps were calculated using RAPID software. CONTRAST:  OMNIPAQUE IOHEXOL 350 MG/ML SOLN COMPARISON:  Plain head CT 1116 hours. FINDINGS: CT Brain Perfusion Findings: ASPECTS: 7 CBF (<30%) Volume: 42mL Perfusion (Tmax>6.0s) volume: 70mL.  Hypoperfusion index 0.5 Mismatch Volume: 28mL, mismatch ratio 1.7 Infarction Location:Posterior left MCA CTA NECK Skeleton: Carious posterior dentition. Minimal cervical spine degeneration. No acute osseous abnormality identified. Upper chest: Negative. Other neck: Postinflammatory dystrophic calcifications of the palatine tonsils. Left maxillary sinus disease. Otherwise negative. Aortic arch: Mildly tortuous aortic arch. Three vessel arch configuration without arch atherosclerosis. Right carotid system: Mildly tortuous brachiocephalic artery. Negative right CCA and right carotid bifurcation. Tortuous right ICA distal to the bulb but no stenosis to the skull base. Left carotid system: Negative left CCA. Minimal calcified plaque at  the left carotid bifurcation. Tortuous left ICA below the skull base without stenosis. Vertebral arteries: Normal proximal right subclavian artery and right vertebral artery origin. The right vertebral is patent and normal to the skull base. Normal proximal left  subclavian artery and left vertebral artery origin. The left vertebral is patent and normal to the skull base. CTA HEAD Posterior circulation: Codominant vertebral arteries. Patent V4 segments with no stenosis on the left, but there is a severe stenosis at the right vertebrobasilar junction best seen on series 9, image 29. Normal PICA origins. Patent basilar artery with mild irregularity but no significant stenosis. Patent SCA and PCA origins. Posterior communicating arteries are diminutive or absent. Bilateral PCA irregularity in keeping with intracranial atherosclerosis. Moderate to severe stenosis of the right P1/P2 junction, and mild left PCA stenoses on series 8, image 17. Anterior circulation: Both ICA siphons are patent. On the left there is mild calcified plaque with no significant siphon stenosis. Normal left ophthalmic artery origin. Patent left ICA terminus. Mild right siphon calcified plaque without stenosis. Normal right ophthalmic artery origin and right ICA terminus. Patent MCA and ACA origins. Mild stenosis at the right A1 origin on series 9, image 24. Anterior communicating artery and ACA A2 segments are within normal limits. There is a moderate to severe distal left A2/pericallosal stenosis on series 10, image 23. Right MCA M1 and right MCA trifurcation are patent without stenosis. Right MCA branches are within normal limits. Left MCA M1 and left MCA trifurcation are patent without stenosis. There is occlusion of a posterior right MCA branch, distal M2 or proximal M3 seen on series 7, images 155-157. This is also on series 10, image 31. The other left MCA branches appear patent with mild irregularity. Venous sinuses: Early contrast timing, not well evaluated. Anatomic variants: None. Review of the MIP images confirms the above findings Left MCA findings reviewed in person Dr. Erick Blinks on 05/07/2021 at 1135 hours, and discussed again by telephone at 11:45 . IMPRESSION: 1. Positive for  occlusion of a posterior Left MCA branch, distal M2 versus proximal M3. Associated posterior left MCA territory infarct core of 42 mL and 70 mL estimated penumbra by CTP (mismatch of 28 mL). This was discussed with Dr. Erick Blinks on 05/07/2021 beginning at 1135 hours. 2. 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 - moderate to severe stenoses of the Right PCA P1/P2 junction. 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 Electronically Signed   By: Peter Lucas M.D.   On: 05/07/2021 11:57    Procedures .Critical Care Performed by: Laurence Spates, MD Authorized by: Laurence Spates, MD   Critical care provider statement:    Critical care time (minutes):  30   Critical care time was exclusive of:  Separately billable procedures and treating other patients   Critical care was necessary to treat or prevent imminent or life-threatening deterioration of the following conditions:  CNS failure or compromise   Critical care was time spent personally by me on the following activities:  Development of treatment plan with patient or surrogate, discussions with consultants, obtaining history from patient or surrogate, ordering and performing treatments and interventions, ordering and review of laboratory studies and ordering and review of radiographic studies     Medications Ordered in ED Medications  fentaNYL (SUBLIMAZE) 100 MCG/2ML injection (has no administration in time range)  iohexol (OMNIPAQUE) 350 MG/ML injection 100  mL (100 mLs Intravenous Contrast Given 05/07/21 1132)    ED Course  I have reviewed the triage vital signs and the nursing notes.  Pertinent labs & imaging results that were available during my care of the patient were reviewed by me and considered in my medical decision making (see chart for details).    MDM Rules/Calculators/A&P                           Patient was alert and protecting airway upon arrival to the ED, taken immediately to CT scan and evaluated by neurologist Dr. Derry Lory. Head CT showing evolving L MCA infarct. Outside window for tPA. CTA shows L MCA occlusion. Dr. Derry Lory discussed w/ Dr. [name] who took patient emergently to IR for thrombectomy.  Final Clinical Impression(s) / ED Diagnoses Final diagnoses:  Cerebrovascular accident (CVA) due to occlusion of left middle cerebral artery [facility])    Rx / DC Orders ED Discharge Orders    None       Ming Kunka, Ambrose Finland, MD 05/07/21 1510

## 2021-05-07 NOTE — Progress Notes (Signed)
Pt's daughter took all of pt's belongings home with her. Eara Burruel, [place] Scrape, RN
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
## 2021-05-07 NOTE — Anesthesia Procedure Notes (Signed)
Arterial Line Insertion Start/End6/03/2021 12:20 PM, 05/07/2021 12:25 PM Performed by: Shelton Silvas, MD, Macie Burows, CRNA, CRNA  Patient location: Pre-op. Preanesthetic checklist: patient identified, IV checked, site marked, risks and benefits discussed, surgical consent, monitors and equipment checked, pre-op evaluation, timeout performed and anesthesia consent Lidocaine 1% used for infiltration Left, radial was placed Catheter size: 20 G Hand hygiene performed  and maximum sterile barriers used   Attempts: 1 Procedure performed without using ultrasound guided technique. Following insertion, dressing applied and Biopatch. Post procedure assessment: normal and unchanged  Patient tolerated the procedure well with no immediate complications.

## 2021-05-07 NOTE — Progress Notes (Addendum)
eLink Physician-Brief Progress Note Patient Name: Peter Lucas DOB: 07/05/1966 MRN: [id number]   Date of Service  05/07/2021  HPI/Events of Note  Hypotension - SBP = 94 d/t sedation for CT Scan. Goal SBP = 120-160. No CVL.  eICU Interventions  Plan: 1. Phenylephrine IV infusion via PIV. Titrate to MAP >= 65.     Intervention Category Major Interventions: Hypotension - evaluation and management  Nashiya Disbrow [name] 05/07/2021, 8:04 PM

## 2021-05-07 NOTE — Transfer of Care (Signed)
Immediate Anesthesia Transfer of Care Note  Patient: Peter Lucas  Procedure(s) Performed: IR WITH ANESTHESIA (N/A )  Patient Location: PACU  Anesthesia Type:General  Level of Consciousness: Patient remains intubated per anesthesia plan  Airway & Oxygen Therapy: Patient re-intubated and Patient placed on Ventilator (see vital sign flow sheet for setting)  Post-op Assessment: Report given to RN  Post vital signs: Reviewed and stable  Last Vitals:  Vitals Value Taken Time  BP 144/89 05/07/21 1453  Temp    Pulse 76 05/07/21 1504  Resp 0 05/07/21 1504  SpO2 100 % 05/07/21 1504  Vitals shown include unvalidated device data.  Last Pain: There were no vitals filed for this visit.       Complications: No complications documented.

## 2021-05-07 NOTE — Anesthesia Procedure Notes (Signed)
Procedure Name: Intubation Date/Time: 05/07/2021 12:16 PM Performed by: Macie Burows, CRNA Pre-anesthesia Checklist: Patient identified, Emergency Drugs available, Suction available and Patient being monitored Patient Re-evaluated:Patient Re-evaluated prior to induction Oxygen Delivery Method: Ambu bag Preoxygenation: Pre-oxygenation with 100% oxygen Induction Type: IV induction Laryngoscope Size: Glidescope and 4 Grade View: Grade I Tube type: Oral Tube size: 7.5 mm Number of attempts: 1 Airway Equipment and Method: Video-laryngoscopy and Rigid stylet Placement Confirmation: ETT inserted through vocal cords under direct vision,  breath sounds checked- equal and bilateral and CO2 detector Secured at: 23 cm Tube secured with: Tape Dental Injury: Teeth and Oropharynx as per pre-operative assessment

## 2021-05-07 NOTE — Progress Notes (Signed)
Transported pt to CT and back to room 4N18.
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
## 2021-05-07 NOTE — Procedures (Signed)
S/P Lt common carotid artrriogram  RT CFA approach  Revascularization of occluded inf division branch M2/M3 junction with x 4 passes with contact aspiration achieving a TICI 2C revascularization . Reocclusion due to suspected underlying ICAD . [place] Kitchen Repeat CT brain demonstrates focal hyperattenuation  in the distal peri sylvian fissure.. Decision made not to place rescue stent due to Increased risk of ICH due to dual antiplatelets and near equivalent core and mismatch. Patient extubated . However reintubated due to patient being combative and unable to comminicate due to global aphasia.. Distal pulses present bilaterally. 32F angioseal for hemostasis in the RT groin . S.Stefano Trulson MD

## 2021-05-07 NOTE — Anesthesia Postprocedure Evaluation (Signed)
Anesthesia Post Note  Patient: Peter Lucas  Procedure(s) Performed: IR WITH ANESTHESIA (N/A )     Patient location during evaluation: PACU Anesthesia Type: General Level of consciousness: sedated Pain management: pain level controlled Vital Signs Assessment: post-procedure vital signs reviewed and stable Respiratory status: patient remains intubated per anesthesia plan Cardiovascular status: stable Postop Assessment: no apparent nausea or vomiting Anesthetic complications: no   No complications documented.  Last Vitals:  Vitals:   05/07/21 1615 05/07/21 1630  BP: 101/69 99/72  Pulse: 62 60  Resp: 18 18  Temp: 36.4 C   SpO2: 97% 95%    Last Pain:  Vitals:   05/07/21 1615  TempSrc: Axillary                 Shelton Silvas

## 2021-05-07 NOTE — Anesthesia Preprocedure Evaluation (Addendum)
Anesthesia Evaluation  Patient identified by MRN, date of birth, ID band Patient confused    Reviewed: Patient's Chart, lab work & pertinent test results, Unable to perform ROS - Chart review onlyPreop documentation limited or incomplete due to emergent nature of procedure.  Airway Mallampati: III  TM Distance: >3 FB Neck ROM: Full    Dental  (+) Teeth Intact, Dental Advisory Given   Pulmonary neg pulmonary ROS,    breath sounds clear to auscultation       Cardiovascular Normal cardiovascular exam     Neuro/Psych negative neurological ROS  negative psych ROS   GI/Hepatic   Endo/Other    Renal/GU      Musculoskeletal   Abdominal (+) + obese,   Peds  Hematology   Anesthesia Other Findings   Reproductive/Obstetrics                            Anesthesia Physical Anesthesia Plan  ASA: III and emergent  Anesthesia Plan: General   Post-op Pain Management:    Induction: Intravenous  PONV Risk Score and Plan: 2 and Ondansetron and Treatment may vary due to age or medical condition  Airway Management Planned: Oral ETT  Additional Equipment: Arterial line  Intra-op Plan:   Post-operative Plan: Possible Post-op intubation/ventilation  Informed Consent: I have reviewed the patients History and Physical, chart, labs and discussed the procedure including the risks, benefits and alternatives for the proposed anesthesia with the patient or authorized representative who has indicated his/her understanding and acceptance.     History available from chart only and Only emergency history available  Plan Discussed with: CRNA  Anesthesia Plan Comments:       Anesthesia Quick Evaluation

## 2021-05-08 ENCOUNTER — Inpatient Hospital Stay (HOSPITAL_COMMUNITY): Payer: BC Managed Care – PPO

## 2021-05-08 DIAGNOSIS — Z978 Presence of other specified devices: Secondary | ICD-10-CM

## 2021-05-08 DIAGNOSIS — I63512 Cerebral infarction due to unspecified occlusion or stenosis of left middle cerebral artery: Secondary | ICD-10-CM

## 2021-05-08 DIAGNOSIS — I6602 Occlusion and stenosis of left middle cerebral artery: Secondary | ICD-10-CM

## 2021-05-08 LAB — BASIC METABOLIC PANEL
Anion gap: 6 (ref 5–15)
Anion gap: 9 (ref 5–15)
BUN: 20 mg/dL (ref 6–20)
BUN: 22 mg/dL — ABNORMAL HIGH (ref 6–20)
CO2: 20 mmol/L — ABNORMAL LOW (ref 22–32)
CO2: 19 mmol/L — ABNORMAL LOW (ref 22–32)
Calcium: 7.5 mg/dL — ABNORMAL LOW (ref 8.9–10.3)
Calcium: 7.7 mg/dL — ABNORMAL LOW (ref 8.9–10.3)
Chloride: 112 mmol/L — ABNORMAL HIGH (ref 98–111)
Chloride: 109 mmol/L (ref 98–111)
Creatinine, Ser: 1.79 mg/dL — ABNORMAL HIGH (ref 0.61–1.24)
Creatinine, Ser: 2.19 mg/dL — ABNORMAL HIGH (ref 0.61–1.24)
GFR, Estimated: 44 mL/min — ABNORMAL LOW (ref >60)
GFR, Estimated: 34 mL/min — ABNORMAL LOW (ref >60)
Glucose, Bld: 115 mg/dL — ABNORMAL HIGH (ref 70–99)
Glucose, Bld: 126 mg/dL — ABNORMAL HIGH (ref 70–99)
Potassium: 3.3 mmol/L — ABNORMAL LOW (ref 3.5–5.1)
Potassium: 4.5 mmol/L (ref 3.5–5.1)
Sodium: 138 mmol/L (ref 135–145)
Sodium: 137 mmol/L (ref 135–145)

## 2021-05-08 LAB — ECHOCARDIOGRAM COMPLETE BUBBLE STUDY
Area-P 1/2: 2.95 cm2
S' Lateral: 3.5 cm

## 2021-05-08 LAB — GLUCOSE, CAPILLARY
Glucose-Capillary: 125 mg/dL — ABNORMAL HIGH (ref 70–99)
Glucose-Capillary: 168 mg/dL — ABNORMAL HIGH (ref 70–99)
Glucose-Capillary: 133 mg/dL — ABNORMAL HIGH (ref 70–99)
Glucose-Capillary: 157 mg/dL — ABNORMAL HIGH (ref 70–99)
Glucose-Capillary: 113 mg/dL — ABNORMAL HIGH (ref 70–99)
Glucose-Capillary: 115 mg/dL — ABNORMAL HIGH (ref 70–99)

## 2021-05-08 LAB — POCT I-STAT 7, (LYTES, BLD GAS, ICA,H+H)
Acid-base deficit: 4 mmol/L — ABNORMAL HIGH (ref 0.0–2.0)
Bicarbonate: 22.7 mmol/L (ref 20.0–28.0)
Calcium, Ion: 1.11 mmol/L — ABNORMAL LOW (ref 1.15–1.40)
HCT: 41 % (ref 39.0–52.0)
Hemoglobin: 13.9 g/dL (ref 13.0–17.0)
O2 Saturation: 99 %
Potassium: 4.2 mmol/L (ref 3.5–5.1)
Sodium: 141 mmol/L (ref 135–145)
TCO2: 24 mmol/L (ref 22–32)
pCO2 arterial: 46.8 mmHg (ref 32.0–48.0)
pH, Arterial: 7.294 — ABNORMAL LOW (ref 7.350–7.450)
pO2, Arterial: 146 mmHg — ABNORMAL HIGH (ref 83.0–108.0)

## 2021-05-08 LAB — CBC WITH DIFFERENTIAL/PLATELET
Abs Immature Granulocytes: 0.03 10*3/uL (ref 0.00–0.07)
Basophils Absolute: 0 10*3/uL (ref 0.0–0.1)
Basophils Relative: 0 %
Eosinophils Absolute: 0.2 10*3/uL (ref 0.0–0.5)
Eosinophils Relative: 2 %
HCT: 39.4 % (ref 39.0–52.0)
Hemoglobin: 13.1 g/dL (ref 13.0–17.0)
Immature Granulocytes: 0 %
Lymphocytes Relative: 20 %
Lymphs Abs: 1.9 10*3/uL (ref 0.7–4.0)
MCH: 28.4 pg (ref 26.0–34.0)
MCHC: 33.2 g/dL (ref 30.0–36.0)
MCV: 85.3 fL (ref 80.0–100.0)
Monocytes Absolute: 0.7 10*3/uL (ref 0.1–1.0)
Monocytes Relative: 7 %
Neutro Abs: 6.3 10*3/uL (ref 1.7–7.7)
Neutrophils Relative %: 71 %
Platelets: 283 10*3/uL (ref 150–400)
RBC: 4.62 MIL/uL (ref 4.22–5.81)
RDW: 14 % (ref 11.5–15.5)
WBC: 9.1 10*3/uL (ref 4.0–10.5)
nRBC: 0 % (ref 0.0–0.2)

## 2021-05-08 LAB — LIPID PANEL
Cholesterol: 135 mg/dL (ref 0–200)
HDL: 20 mg/dL — ABNORMAL LOW (ref >40)
LDL Cholesterol: 86 mg/dL (ref 0–99)
Total CHOL/HDL Ratio: 6.8 RATIO
Triglycerides: 144 mg/dL (ref <150)
VLDL: 29 mg/dL (ref 0–40)

## 2021-05-08 LAB — MAGNESIUM: Magnesium: 1.9 mg/dL (ref 1.7–2.4)

## 2021-05-08 MED ORDER — POTASSIUM CHLORIDE 10 MEQ/100ML IV SOLN
10.0000 meq | INTRAVENOUS | Status: AC
  Administered 2021-05-08 (×4): 10 meq via INTRAVENOUS
  Filled 2021-05-08 (×4): qty 100

## 2021-05-08 MED ORDER — SODIUM CHLORIDE 0.9 % IV BOLUS
1000.0000 mL | Freq: Once | INTRAVENOUS | Status: AC
  Administered 2021-05-08: 1000 mL via INTRAVENOUS

## 2021-05-08 MED ORDER — PROPOFOL 1000 MG/100ML IV EMUL
INTRAVENOUS | Status: AC
  Filled 2021-05-08: qty 100

## 2021-05-08 MED ORDER — SODIUM CHLORIDE 0.9 % IV SOLN: 250.0000 mL | INTRAVENOUS | Status: DC

## 2021-05-08 MED ORDER — ORAL CARE MOUTH RINSE: 15.0000 mL | OROMUCOSAL | Status: DC

## 2021-05-08 MED ORDER — LABETALOL HCL 5 MG/ML IV SOLN
20.0000 mg | INTRAVENOUS | Status: DC | PRN
  Administered 2021-05-09 – 2021-05-11 (×5): 20 mg via INTRAVENOUS
  Filled 2021-05-08 (×6): qty 4

## 2021-05-08 MED ORDER — ETOMIDATE 2 MG/ML IV SOLN
INTRAVENOUS | Status: AC
  Filled 2021-05-08: qty 30

## 2021-05-08 MED ORDER — CHLORHEXIDINE GLUCONATE 0.12% ORAL RINSE (MEDLINE KIT): 15.0000 mL | Freq: Two times a day (BID) | OROMUCOSAL | Status: DC

## 2021-05-08 MED ORDER — FENTANYL 2500MCG IN NS 250ML (10MCG/ML) PREMIX INFUSION
50.0000 ug/h | INTRAVENOUS | Status: DC
  Administered 2021-05-08: 50 ug/h via INTRAVENOUS
  Administered 2021-05-09: 100 ug/h via INTRAVENOUS
  Filled 2021-05-08: qty 250

## 2021-05-08 MED ORDER — FENTANYL CITRATE (PF) 100 MCG/2ML IJ SOLN
50.0000 ug | Freq: Once | INTRAMUSCULAR | Status: AC
  Administered 2021-05-08: 50 ug via INTRAVENOUS

## 2021-05-08 MED ORDER — MIDAZOLAM HCL 2 MG/2ML IJ SOLN: 2.0000 mg | Freq: Once | INTRAMUSCULAR | Status: AC

## 2021-05-08 MED ORDER — POLYETHYLENE GLYCOL 3350 17 G PO PACK: 17.0000 g | PACK | Freq: Every day | ORAL | Status: DC

## 2021-05-08 MED ORDER — MIDAZOLAM HCL 2 MG/2ML IJ SOLN
INTRAMUSCULAR | Status: AC
  Administered 2021-05-08: 2 mg via INTRAVENOUS
  Filled 2021-05-08: qty 2

## 2021-05-08 MED ORDER — LABETALOL HCL 5 MG/ML IV SOLN
INTRAVENOUS | Status: AC
  Filled 2021-05-08: qty 4

## 2021-05-08 MED ORDER — ASPIRIN 300 MG RE SUPP
300.0000 mg | Freq: Every day | RECTAL | Status: DC
  Administered 2021-05-08 – 2021-05-09 (×2): 300 mg via RECTAL
  Filled 2021-05-08 (×2): qty 1

## 2021-05-08 MED ORDER — DOCUSATE SODIUM 50 MG/5ML PO LIQD: 100.0000 mg | Freq: Two times a day (BID) | ORAL | Status: DC

## 2021-05-08 MED ORDER — FENTANYL BOLUS VIA INFUSION
50.0000 ug | INTRAVENOUS | Status: DC | PRN
  Filled 2021-05-08: qty 100

## 2021-05-08 MED ORDER — FUROSEMIDE 10 MG/ML IJ SOLN
40.0000 mg | Freq: Once | INTRAMUSCULAR | Status: AC
  Administered 2021-05-09: 40 mg via INTRAVENOUS
  Filled 2021-05-08: qty 4

## 2021-05-08 MED ORDER — NITROGLYCERIN 2 % TD OINT
0.5000 [in_us] | TOPICAL_OINTMENT | Freq: Once | TRANSDERMAL | Status: DC
  Filled 2021-05-08: qty 30

## 2021-05-08 MED ORDER — FUROSEMIDE 10 MG/ML IJ SOLN
INTRAMUSCULAR | Status: AC
  Filled 2021-05-08: qty 4

## 2021-05-08 MED ORDER — PROPOFOL 1000 MG/100ML IV EMUL
0.0000 ug/kg/min | INTRAVENOUS | Status: DC
  Administered 2021-05-08 (×3): 40 ug/kg/min via INTRAVENOUS
  Administered 2021-05-08: 50 ug/kg/min via INTRAVENOUS
  Administered 2021-05-09: 40 ug/kg/min via INTRAVENOUS
  Administered 2021-05-09 (×2): 45 ug/kg/min via INTRAVENOUS
  Filled 2021-05-08 (×6): qty 100

## 2021-05-08 MED ORDER — FUROSEMIDE 10 MG/ML IJ SOLN
40.0000 mg | Freq: Once | INTRAMUSCULAR | Status: AC
  Administered 2021-05-08: 40 mg via INTRAVENOUS

## 2021-05-08 MED ORDER — ALBUTEROL SULFATE (2.5 MG/3ML) 0.083% IN NEBU
INHALATION_SOLUTION | RESPIRATORY_TRACT | Status: AC
  Administered 2021-05-08: 2.5 mg via RESPIRATORY_TRACT
  Filled 2021-05-08: qty 3

## 2021-05-08 MED ORDER — LACTATED RINGERS IV SOLN: INTRAVENOUS | Status: DC

## 2021-05-08 MED ORDER — ALBUTEROL SULFATE (2.5 MG/3ML) 0.083% IN NEBU: 2.5000 mg | INHALATION_SOLUTION | RESPIRATORY_TRACT | Status: DC | PRN

## 2021-05-08 MED ORDER — PHENYLEPHRINE HCL-NACL 10-0.9 MG/250ML-% IV SOLN: 25.0000 ug/min | INTRAVENOUS | Status: DC

## 2021-05-08 NOTE — Progress Notes (Signed)
K+3.3 ?Replaced per protocol  ?

## 2021-05-08 NOTE — Progress Notes (Signed)
[facility] [employer] attending  Evaluated patient at bedside with CCM NP at 11:00 today while on a spontaneous breathing trial 8/5 PSV.  He was taking tidal volumes > 1000cc with RR ~15x min.  Nodding head appropriately, following commands though clearly agitated by the presence of the endotracheal tube.  O2 saturation normal, BP ~180's/100's.  Decision made to extubate at the time based on these parameters.  Not long after extubation he developed worsening hypertension and hypoxemia.  Treated with lasix, labetalol, bipap and had improvement in HR, BP, RR, and O2 saturation.  However as noted later developed change in mental status requiring intubation by my partner for further evaluation.  Will continue to monitor closely.  Heber Buckland, MD [place] [facility] Pager: [phone] Cell: [phone] If no response, please call [phone] until 7pm After 7:00 pm call Elink  [phone]

## 2021-05-08 NOTE — Progress Notes (Signed)
SLP Cancellation Note  Patient Details Name: Peter Lucas MRN: [id number] DOB: [birth year]   Cancelled treatment:       Reason Eval/Treat Not Completed: Patient not medically ready (remains on vent s/p thrombectomy). Will f/u as able.    Mahala Menghini., M.A. [name] Acute Rehabilitation Services Pager [phone] Office [phone]  05/08/2021, 7:17 AM

## 2021-05-08 NOTE — Progress Notes (Signed)
Pt transported on the ventilator from 4N18 to CT1 and back without difficulty. RT, RN and transport accompanied pt. VSS throughout. RT will continue to monitor and be available as needed.

## 2021-05-08 NOTE — Progress Notes (Signed)
eLink Physician-Brief Progress Note Patient Name: Peter Lucas DOB: 04-May-1966 MRN: [id number]   Date of Service  05/08/2021  HPI/Events of Note  Oliguria - Bladder scan with 100 mL residual.   eICU Interventions  Plan: 1. Bolus with 0.9 NaCl 1 liter IV over 1 hour now.      Intervention Category Major Interventions: Other:  [name] 05/08/2021, 3:25 AM

## 2021-05-08 NOTE — Progress Notes (Signed)
  Echocardiogram 2D Echocardiogram has been performed.  Delcie Roch 05/08/2021, 4:57 PM

## 2021-05-08 NOTE — Discharge Instructions (Signed)
Femoral Site Care This sheet gives you information about how to care for yourself after your procedure. Your health care provider may also give you more specific instructions. If you have problems or questions, contact your health care provider. What can I expect after the procedure? After the procedure, it is common to have:  Bruising that usually fades within 1-2 weeks.  Tenderness at the site. Follow these instructions at home: Wound care 1. Follow instructions from your health care provider about how to take care of your insertion site. Make sure you: ? Wash your hands with soap and water before you change your bandage (dressing). If soap and water are not available, use hand sanitizer. ? Change your dressing as directed- pressure dressing removed 24 hours post-procedure (and switch for bandaid), bandaid removed 72 hours post-procedure 2. Do not take baths, swim, or use a hot tub for 7 days post-procedure. 3. You may shower 48 hours after the procedure or as told by your health care provider. ? Gently wash the site with plain soap and water. ? Pat the area dry with a clean towel. ? Do not rub the site. This may cause bleeding. 4. Check your site every day for signs of infection. Check for: ? Redness, swelling, or pain. ? Fluid or blood. ? Warmth. ? Pus or a bad smell. Activity  Do not stoop, bend, or lift anything that is heavier than 10 lb (4.5 kg) for 2 weeks post-procedure.  Do not drive self for 2 weeks post-procedure. Contact a health care provider if you have:  A fever or chills.  You have redness, swelling, or pain around your insertion site. Get help right away if:  The catheter insertion area swells very fast.  You pass out.  You suddenly start to sweat or your skin gets clammy.  The catheter insertion area is bleeding, and the bleeding does not stop when you hold steady pressure on the area.  The area near or just beyond the catheter insertion site becomes  pale, cool, tingly, or numb. These symptoms may represent a serious problem that is an emergency. Do not wait to see if the symptoms will go away. Get medical help right away. Call your local emergency services (911 in the [place] Do not drive yourself to the hospital.  This information is not intended to replace advice given to you by your health care provider. Make sure you discuss any questions you have with your health care provider. Document Revised: 12/03/2017 Document Reviewed: 12/03/2017 Elsevier Patient Education  2020 Elsevier Inc. 

## 2021-05-08 NOTE — Progress Notes (Signed)
Called to find out good time to bring pt down for MRI. Per RN, pt is too unstable to come down at this time and on too many drips. Possible extubation today. RN will f/u sometime today. Dr. Ezzie Dural aware.
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
## 2021-05-08 NOTE — Procedures (Signed)
Extubation Procedure Note  Patient Details:   Name: [name] DOB: Aug 07, 1966 MRN: [id number]   Airway Documentation:    Vent end date: 05/08/21 Vent end time: 1128   Evaluation  O2 sats: transiently fell during during procedure Complications: pulmonary edema. Needing bipap.  Patient did tolerate procedure well. Bilateral Breath Sounds: Coarse crackles,Diminished   No   Pt extubated per physician order. Pt suctioned via ETT and orally prior to extubation. Pt extubated to 4L nasal cannula and initially tolerated well. No stridor heard at this time. Pt then began to have some respiratory distress with hypoxia. Pt progressed from 4L to a NRB with minimal improvement. Bilateral breath sounds with crackles throughout. CCM MD and CCM NP at bedside, verbal order received to place pt on bipap. Pt with improvement on bipap. RT will continue to monitor an be available.   Derinda Late 05/08/2021, 12:05 PM

## 2021-05-08 NOTE — Progress Notes (Signed)
INTERVAL HISTORY His son and daughter are at the bedside.  Pt is intubated on Propofol and fentanyl for agitation. Propofol held for 10 min for exam. Family reported that pt smokes 1 PPD. Doesn't follow with PCP. He had an episode of what seems to be Amaurosis fugax last month .  Later around noon, pt had a trial of extubation but became hypoxic and hypertensive and less responsive so was reextubated.   Vitals:   05/08/21 0600 05/08/21 0700 05/08/21 0800 05/08/21 0900  BP: 136/87 110/66 105/64 117/75  Pulse: 65 62 (!) 57 (!) 57  Resp: Temp:   98.4 F (36.9 C)   TempSrc:   Axillary   SpO2: (!) 87% 93% 94% 93%  Weight:      Height:       CBC:  Recent Labs  Lab 05/07/21 1141 05/07/21 1244 05/07/21 1620 05/08/21 0412  WBC 11.1* 10.5  --  9.1  NEUTROABS 8.9*  --   --  6.3  HGB 16.9 14.8 16.0 13.1  HCT 50.7 44.6 47.0 39.4  MCV 84.2 84.8  --  85.3  PLT 270 276  --  283   Basic Metabolic Panel:  Recent Labs  Lab 05/07/21 1141 05/07/21 1620 05/08/21 0412  NA 136 140 138  K 3.7 3.5 3.3*  CL 103  --  112*  CO2 22  --  20*  GLUCOSE 185*  --  115*  BUN 16  --  20  CREATININE 1.21  --  1.79*  CALCIUM 9.0  --  7.5*   Lipid Panel:  Recent Labs  Lab 05/08/21 0412  CHOL 135  TRIG 144  HDL 20*  CHOLHDL 6.8  VLDL 29  LDLCALC 86   HgbA1c: No results for input(s): HGBA1C in the last 168 hours. Urine Drug Screen:  Recent Labs  Lab 05/07/21 1704  LABOPIA NONE DETECTED  COCAINSCRNUR NONE DETECTED  LABBENZ NONE DETECTED  AMPHETMU NONE DETECTED  THCU NONE DETECTED  LABBARB NONE DETECTED    Alcohol Level  Recent Labs  Lab 05/07/21 1141  ETH <10    IMAGING past 24 hours CT HEAD WO CONTRAST  Result Date: 05/07/2021 CLINICAL DATA:  Left MCA peripheral occlusion status post percutaneous thrombectomy. EXAM: CT HEAD WITHOUT CONTRAST TECHNIQUE: Contiguous axial images were obtained from the base of the skull through the vertex without intravenous contrast.  COMPARISON:  Intraoperative cone beam CT 05/07/2021 at 2:42 p.m., CT head 11:15 a.m. FINDINGS: Brain: There is again identified high attenuation material intravascularly within the distal left MCA branches in the region of the sylvian fissure posteriorly. There is petechial hemorrhage involving the temporal operculum in keeping with a a small cortical infarct in this region, best appreciated on axial image # 17/6. Trace subarachnoid hemorrhage is noted within the sylvian fissure at this level as well as within the sulci of the left para falcine parietal lobe. This appears stable when compared to prior cone beam CT arteriogram. There is no significant associated mass effect. No midline shift. No foci of acute intracranial hemorrhage. Ventricular size is normal. Cerebellum is unremarkable. Vascular: No new asymmetric hyperdensity within the a intravascular spaces at the skull base. Skull: Intact Sinuses/Orbits: There is opacification of the left maxillary sinus, unchanged as well as mild mucosal thickening within the ethmoid air cells bilaterally. The orbits are unremarkable. Other: Mastoid air cells and middle ear cavities are clear. IMPRESSION: Stable hyperdensity within the terminal left MCA branches in the region of the  posterior left sylvian fissure in keeping with intraluminal thrombus and/or contrast as well as petechial hemorrhage involving a small adjacent cortical infarct involving the left temporal operculum. Stable small subarachnoid hemorrhage in the region of the a cortical infarct as well as the left parietal lobe. No abnormal mass effect. Electronically Signed   By: [name]   On: 05/07/2021 20:44   CT CEREBRAL PERFUSION W CONTRAST  Result Date: 05/07/2021 CLINICAL DATA:  55 year old male code stroke presentation with posterior left MCA infarct visible on plain CT. EXAM: CT ANGIOGRAPHY HEAD AND NECK CT PERFUSION BRAIN TECHNIQUE: Multidetector CT imaging of the head and neck was performed  using the standard protocol during bolus administration of intravenous contrast. Multiplanar CT image reconstructions and MIPs were obtained to evaluate the vascular anatomy. Carotid stenosis measurements (when applicable) are obtained utilizing NASCET criteria, using the distal internal carotid diameter as the denominator. Multiphase CT imaging of the brain was performed following IV bolus contrast injection. Subsequent parametric perfusion maps were calculated using RAPID software. CONTRAST:  OMNIPAQUE IOHEXOL 350 MG/ML SOLN COMPARISON:  Plain head CT 1116 hours. FINDINGS: CT Brain Perfusion Findings: ASPECTS: 7 CBF (<30%) Volume: 41mL Perfusion (Tmax>6.0s) volume: 11mL.  Hypoperfusion index 0.5 Mismatch Volume: 53mL, mismatch ratio 1.7 Infarction Location:Posterior left MCA CTA NECK Skeleton: Carious posterior dentition. Minimal cervical spine degeneration. No acute osseous abnormality identified. Upper chest: Negative. Other neck: Postinflammatory dystrophic calcifications of the palatine tonsils. Left maxillary sinus disease. Otherwise negative. Aortic arch: Mildly tortuous aortic arch. Three vessel arch configuration without arch atherosclerosis. Right carotid system: Mildly tortuous brachiocephalic artery. Negative right CCA and right carotid bifurcation. Tortuous right ICA distal to the bulb but no stenosis to the skull base. Left carotid system: Negative left CCA. Minimal calcified plaque at the left carotid bifurcation. Tortuous left ICA below the skull base without stenosis. Vertebral arteries: Normal proximal right subclavian artery and right vertebral artery origin. The right vertebral is patent and normal to the skull base. Normal proximal left subclavian artery and left vertebral artery origin. The left vertebral is patent and normal to the skull base. CTA HEAD Posterior circulation: Codominant vertebral arteries. Patent V4 segments with no stenosis on the left, but there is a severe stenosis at  the right vertebrobasilar junction best seen on series 9, image 29. Normal PICA origins. Patent basilar artery with mild irregularity but no significant stenosis. Patent SCA and PCA origins. Posterior communicating arteries are diminutive or absent. Bilateral PCA irregularity in keeping with intracranial atherosclerosis. Moderate to severe stenosis of the right P1/P2 junction, and mild left PCA stenoses on series 8, image 17. Anterior circulation: Both ICA siphons are patent. On the left there is mild calcified plaque with no significant siphon stenosis. Normal left ophthalmic artery origin. Patent left ICA terminus. Mild right siphon calcified plaque without stenosis. Normal right ophthalmic artery origin and right ICA terminus. Patent MCA and ACA origins. Mild stenosis at the right A1 origin on series 9, image 24. Anterior communicating artery and ACA A2 segments are within normal limits. There is a moderate to severe distal left A2/pericallosal stenosis on series 10, image 23. Right MCA M1 and right MCA trifurcation are patent without stenosis. Right MCA branches are within normal limits. Left MCA M1 and left MCA trifurcation are patent without stenosis. There is occlusion of a posterior right MCA branch, distal M2 or proximal M3 seen on series 7, images 155-157. This is also on series 10, image 31. The other left MCA branches appear  patent with mild irregularity. Venous sinuses: Early contrast timing, not well evaluated. Anatomic variants: None. Review of the MIP images confirms the above findings Left MCA findings reviewed in person Dr. Erick Blinks on 05/07/2021 at 1135 hours, and discussed again by telephone at 11:45 . IMPRESSION: 1. Positive for occlusion of a posterior Left MCA branch, distal M2 versus proximal M3. Associated posterior left MCA territory infarct core of 42 mL and 70 mL estimated penumbra by CTP (mismatch of 28 mL). This was discussed with Dr. Erick Blinks on 05/07/2021 beginning at  1135 hours. 2. 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 - moderate to severe stenoses of the Right PCA P1/P2 junction. 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 Electronically Signed   By: Odessa Fleming M.D.   On: 05/07/2021 11:57   Portable Chest x-ray  Result Date: 05/07/2021 CLINICAL DATA:  Code stroke.  Endotracheal tube. EXAM: PORTABLE CHEST 1 VIEW COMPARISON:  None. FINDINGS: Endotracheal tube appears well positioned with tip approximately 3 cm above the carina. Cardiomegaly. Lungs are clear. No pleural effusion or pneumothorax is seen. Osseous structures about the chest are unremarkable. IMPRESSION: 1. Endotracheal tube well positioned with tip approximately 3 cm above the carina. 2. Cardiomegaly. 3. Lungs are clear. Electronically Signed   By: Bary Richard M.D.   On: 05/07/2021 15:57   CT HEAD CODE STROKE WO CONTRAST  Result Date: 05/07/2021 CLINICAL DATA:  Code stroke.  55 year old male neurologic deficit. EXAM: CT HEAD WITHOUT CONTRAST TECHNIQUE: Contiguous axial images were obtained from the base of the skull through the vertex without intravenous contrast. COMPARISON:  None. FINDINGS: Brain: Confluent cytotoxic edema in the posterior left MCA territory affecting the posterosuperior temporal gyrus through the left parietal lobe. No hemorrhagic transformation. No significant mass effect. The insula, internal capsule and deep gray matter nuclei appear spared. Gray-white matter differentiation elsewhere within normal limits. No ventriculomegaly. Normal basilar cisterns. Vascular: Calcified atherosclerosis at the skull base. Suspicious hyperdensity in a posterior left MCA branch along the anterior margin of the cytotoxic edema series 2, image 17 and series 6, image 45. But no more proximal suspicious vessel hyperdensity is identified. Skull: Negative.  Sinuses/Orbits: Subtotal opacification of the left maxillary sinus due to a mucosal thickening and/or retention cyst. Other Visualized paranasal sinuses and mastoids are clear. Other: Visualized orbits and scalp soft tissues are within normal limits. ASPECTS [facility] Stroke Program Early CT Score) - Ganglionic level infarction (caudate, lentiform nuclei, internal capsule, insula, M1-M3 cortex): 6 (abnormal M3) - Supraganglionic infarction (M4-M6 cortex): 1 Total score (0-10 with 10 being normal): 7 IMPRESSION: 1. 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 ASPECTS 7. 2. No hemorrhagic transformation or mass effect. Suspicion of a posterior left MCA branch occlusion at the posterior sylvian fissure. Study discussed by telephone with Dr. Erick Blinks on 05/07/2021 at 11:20 . Electronically Signed   By: Odessa Fleming M.D.   On: 05/07/2021 11:23   CT ANGIO HEAD CODE STROKE  Result Date: 05/07/2021 CLINICAL DATA:  55 year old male code stroke presentation with posterior left MCA infarct visible on plain CT. EXAM: CT ANGIOGRAPHY HEAD AND NECK CT PERFUSION BRAIN TECHNIQUE: Multidetector CT imaging of the head and neck was performed using the standard protocol during bolus administration of intravenous contrast. Multiplanar CT image reconstructions and MIPs were obtained to evaluate the vascular anatomy. Carotid stenosis measurements (when applicable) are obtained utilizing NASCET criteria,  using the distal internal carotid diameter as the denominator. Multiphase CT imaging of the brain was performed following IV bolus contrast injection. Subsequent parametric perfusion maps were calculated using RAPID software. CONTRAST:  OMNIPAQUE IOHEXOL 350 MG/ML SOLN COMPARISON:  Plain head CT 1116 hours. FINDINGS: CT Brain Perfusion Findings: ASPECTS: 7 CBF (<30%) Volume: 83mL Perfusion (Tmax>6.0s) volume: 85mL.  Hypoperfusion index 0.5 Mismatch Volume: 10mL, mismatch ratio 1.7 Infarction  Location:Posterior left MCA CTA NECK Skeleton: Carious posterior dentition. Minimal cervical spine degeneration. No acute osseous abnormality identified. Upper chest: Negative. Other neck: Postinflammatory dystrophic calcifications of the palatine tonsils. Left maxillary sinus disease. Otherwise negative. Aortic arch: Mildly tortuous aortic arch. Three vessel arch configuration without arch atherosclerosis. Right carotid system: Mildly tortuous brachiocephalic artery. Negative right CCA and right carotid bifurcation. Tortuous right ICA distal to the bulb but no stenosis to the skull base. Left carotid system: Negative left CCA. Minimal calcified plaque at the left carotid bifurcation. Tortuous left ICA below the skull base without stenosis. Vertebral arteries: Normal proximal right subclavian artery and right vertebral artery origin. The right vertebral is patent and normal to the skull base. Normal proximal left subclavian artery and left vertebral artery origin. The left vertebral is patent and normal to the skull base. CTA HEAD Posterior circulation: Codominant vertebral arteries. Patent V4 segments with no stenosis on the left, but there is a severe stenosis at the right vertebrobasilar junction best seen on series 9, image 29. Normal PICA origins. Patent basilar artery with mild irregularity but no significant stenosis. Patent SCA and PCA origins. Posterior communicating arteries are diminutive or absent. Bilateral PCA irregularity in keeping with intracranial atherosclerosis. Moderate to severe stenosis of the right P1/P2 junction, and mild left PCA stenoses on series 8, image 17. Anterior circulation: Both ICA siphons are patent. On the left there is mild calcified plaque with no significant siphon stenosis. Normal left ophthalmic artery origin. Patent left ICA terminus. Mild right siphon calcified plaque without stenosis. Normal right ophthalmic artery origin and right ICA terminus. Patent MCA and ACA  origins. Mild stenosis at the right A1 origin on series 9, image 24. Anterior communicating artery and ACA A2 segments are within normal limits. There is a moderate to severe distal left A2/pericallosal stenosis on series 10, image 23. Right MCA M1 and right MCA trifurcation are patent without stenosis. Right MCA branches are within normal limits. Left MCA M1 and left MCA trifurcation are patent without stenosis. There is occlusion of a posterior right MCA branch, distal M2 or proximal M3 seen on series 7, images 155-157. This is also on series 10, image 31. The other left MCA branches appear patent with mild irregularity. Venous sinuses: Early contrast timing, not well evaluated. Anatomic variants: None. Review of the MIP images confirms the above findings Left MCA findings reviewed in person Dr. Erick Blinks on 05/07/2021 at 1135 hours, and discussed again by telephone at 11:45 . IMPRESSION: 1. Positive for occlusion of a posterior Left MCA branch, distal M2 versus proximal M3. Associated posterior left MCA territory infarct core of 42 mL and 70 mL estimated penumbra by CTP (mismatch of 28 mL). This was discussed with Dr. Erick Blinks on 05/07/2021 beginning at 1135 hours. 2. 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 - moderate to severe stenoses of the Right PCA P1/P2 junction. 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 Electronically Signed   By: Rexene Edison  Margo Aye M.D.   On: 05/07/2021 11:57   CT ANGIO NECK CODE STROKE  Result Date: 05/07/2021 CLINICAL DATA:  55 year old male code stroke presentation with posterior left MCA infarct visible on plain CT. EXAM: CT ANGIOGRAPHY HEAD AND NECK CT PERFUSION BRAIN TECHNIQUE: Multidetector CT imaging of the head and neck was performed using the standard protocol during bolus administration of intravenous contrast. Multiplanar CT  image reconstructions and MIPs were obtained to evaluate the vascular anatomy. Carotid stenosis measurements (when applicable) are obtained utilizing NASCET criteria, using the distal internal carotid diameter as the denominator. Multiphase CT imaging of the brain was performed following IV bolus contrast injection. Subsequent parametric perfusion maps were calculated using RAPID software. CONTRAST:  OMNIPAQUE IOHEXOL 350 MG/ML SOLN COMPARISON:  Plain head CT 1116 hours. FINDINGS: CT Brain Perfusion Findings: ASPECTS: 7 CBF (<30%) Volume: 42mL Perfusion (Tmax>6.0s) volume: 70mL.  Hypoperfusion index 0.5 Mismatch Volume: 28mL, mismatch ratio 1.7 Infarction Location:Posterior left MCA CTA NECK Skeleton: Carious posterior dentition. Minimal cervical spine degeneration. No acute osseous abnormality identified. Upper chest: Negative. Other neck: Postinflammatory dystrophic calcifications of the palatine tonsils. Left maxillary sinus disease. Otherwise negative. Aortic arch: Mildly tortuous aortic arch. Three vessel arch configuration without arch atherosclerosis. Right carotid system: Mildly tortuous brachiocephalic artery. Negative right CCA and right carotid bifurcation. Tortuous right ICA distal to the bulb but no stenosis to the skull base. Left carotid system: Negative left CCA. Minimal calcified plaque at the left carotid bifurcation. Tortuous left ICA below the skull base without stenosis. Vertebral arteries: Normal proximal right subclavian artery and right vertebral artery origin. The right vertebral is patent and normal to the skull base. Normal proximal left subclavian artery and left vertebral artery origin. The left vertebral is patent and normal to the skull base. CTA HEAD Posterior circulation: Codominant vertebral arteries. Patent V4 segments with no stenosis on the left, but there is a severe stenosis at the right vertebrobasilar junction best seen on series 9, image 29. Normal PICA origins. Patent  basilar artery with mild irregularity but no significant stenosis. Patent SCA and PCA origins. Posterior communicating arteries are diminutive or absent. Bilateral PCA irregularity in keeping with intracranial atherosclerosis. Moderate to severe stenosis of the right P1/P2 junction, and mild left PCA stenoses on series 8, image 17. Anterior circulation: Both ICA siphons are patent. On the left there is mild calcified plaque with no significant siphon stenosis. Normal left ophthalmic artery origin. Patent left ICA terminus. Mild right siphon calcified plaque without stenosis. Normal right ophthalmic artery origin and right ICA terminus. Patent MCA and ACA origins. Mild stenosis at the right A1 origin on series 9, image 24. Anterior communicating artery and ACA A2 segments are within normal limits. There is a moderate to severe distal left A2/pericallosal stenosis on series 10, image 23. Right MCA M1 and right MCA trifurcation are patent without stenosis. Right MCA branches are within normal limits. Left MCA M1 and left MCA trifurcation are patent without stenosis. There is occlusion of a posterior right MCA branch, distal M2 or proximal M3 seen on series 7, images 155-157. This is also on series 10, image 31. The other left MCA branches appear patent with mild irregularity. Venous sinuses: Early contrast timing, not well evaluated. Anatomic variants: None. Review of the MIP images confirms the above findings Left MCA findings reviewed in person Dr. Erick Blinks on 05/07/2021 at 1135 hours, and discussed again by telephone at 11:45 . IMPRESSION: 1. Positive for occlusion of a posterior Left MCA branch,  distal M2 versus proximal M3. Associated posterior left MCA territory infarct core of 42 mL and 70 mL estimated penumbra by CTP (mismatch of 28 mL). This was discussed with Dr. Erick BlinksSALMAN KHALIQDINA on 05/07/2021 beginning at 1135 hours. 2. Minimal atherosclerosis in the neck but positive for intracranial atherosclerosis  including: - severe stenosis at the Right Vertebrobasilar junction. - moderate to severe stenoses of the Right PCA P1/P2 junction. 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 Electronically Signed   By: Odessa FlemingH  Hall M.D.   On: 05/07/2021 11:57    PHYSICAL EXAM GENERAL: intuabted sedated HEENT: Head is atraumatic, normocephalic. Eyes: PERRL.  LUNGS: deep breathing sounds CARDIOVASCULAR: Regular rate and rhythm  ABDOMEN: Bowel sounds present.  Ext : normal pulse distally  NEUROLOGICAL EXAM MS: intuabted, sedated, propfol stopped for 10 min, pt opens his eyes, became agitated. No following commands.   Speech - unable to assess CN 2-12: PERRL, Blinks to visual threatening BL, EOMI, no ptosis, no nystagmus, Mild R facial asymmetry, no facial sensory deficits  Motor: Moving all ext spontanously, unable to assess full streghnth.  - Tone normal. - No Abnormal movements identified.   Sensory:  No focal sensory loss.    Coordination:  Unable to assess  Gait was not tested considering pt's condition  ASSESSMENT/PLAN  1. Acute/subacute ischemic stroke within the L Parietotemporal area in the setting of distal LM2 occlusion. ASPECT 7. S/P IR thrombectomy initial with TICI2c with reoccluded vessel.   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 However, cardioemblic event cannot be completely ruled out. Of note, family reported what seems to be an episode of Amaurosis fugax last month they think it was the R eye.     Vascular risk factors: Smoking, suspected underlying DM2, hga1c P,  doesn't follow with PCP.   CT head  Posterior left MCA infarct with fairly well-developed cytotoxic edema. ASPECTS 7. CTA head & neck occlusion of a posterior Left MCA branch, distal M2 versus proximal M3. 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 Moderately severe stenosis of R P1/P2 and L pericallosal branch.  CT perfusion posterior left MCA territory infarct core  of 42 mL and 70 mL estimated penumbra by CTP (mismatch of 28 mL)  Repeated CT head with petechial hemorrhage involving a small adjacent cortical infarct involving the left temporal operculum. Stable small subarachnoid hemorrhage in the region of the a cortical infarct as well as the left parietal lobe.  Repeated CT head this afternoon showed evolving L parietotemporal infarct with less pronounced hyperdensity within the sylvian fissure most likely contrast staining.    MRI P  2D Echo P   LDL 86  HgbA1c P.   Sart Lovenox for VTE prophylaxis Diet Order            Diet NPO time specified  Diet effective now                  No antithrombotics prior to admission, Will start pt on ASA 300 mg PR for now  Therapy recommendations:  P  Disposition:  P  Hypertension  Home meds: None  . Mainatin SBP <160 for now.  . Was on Neo early on the morning due to low bp while on Porpofol then became HTN after extubated. On Clevedipin drip NOW . Will consider starting Oral meds when able to swallow . Long-term BP goal <!40/90  Hyperlipidemia  Home meds:  None   LDL 86, goal < 70  High intensity statin Lipitor 40 mg qhs    Hyperglycemia:   Home meds: None   HgbA1c P, goal < 7.0  CBGs Recent Labs    05/07/21 2307 05/08/21 0312 05/08/21 0733  GLUCAP 117* 125* 168*      SSI  Other Stroke Risk Factors   Cigarette smoker : Needs counseling.   Obesity, Body mass index is 34.28 kg/m., BMI >/= 30 associated with increased stroke risk, recommend weight loss, diet and exercise as appropriate   Suspected Obstructive sleep apnea, consider sleep study as an outpt.   [facility] Critical care team on board Stritct I/O Consider Nephro consult  Respiratory failure,  Intubated.  ICU team on board.    Hypokalemia -replaced - Monitor   Inadequate PO intake -if unable top extubate start EN       Hospital day # 1     To contact Stroke Continuity provider, please  refer to [email or web address]. After hours, contact General Neurology

## 2021-05-08 NOTE — Progress Notes (Addendum)
  Addendum:  Following extubation, new crackles and hypoxia - concerning for flash pulmonary edema. Very hypertensive SBPs 160-200 BiPap, 40 lasix, 20 labetalol, cleviprex   Hypoxia and respiratory effort  and BP improved with the above interventions, however neurologic exam has changed.  Prior to extubation pt following commands (head off pillow, stick out tongue, move extremities), now is not reliably following commands.   Now is not following extremity commands -- still moving spontaneously. Is saying "yeah" in response to all questions, moving LUE repetitively from mouth to groin.  D/w CCM DO -- plan to reintubate and send for STAT imaging to eval for possible progression of intracranial process.   _____________  PCCM Interval Progress Note  Seen in follow up for repeat WUA/SBT -- he is tolerating well and appropriate for extubation.  D/w family risk of reintubation.   Will place orders for extubation, supplemental O2 as needed after, IS.  Monitor for need for reintubation     Tessie Fass MSN, AGACNP-BC [facility] Pulmonary/Critical Care Medicine Amion for pager  05/08/2021, 11:27 AM

## 2021-05-08 NOTE — Progress Notes (Signed)
Referring Physician(s): Code stroke- Erick Blinks (neurology)  Supervising Physician: Julieanne Cotton  Patient Status:  [facility] - In-pt  Chief Complaint:  History of acute CVA s/p cerebral arteriogram with emergent mechanical thrombectomy of left MCA M2/M3 junction occlusion achieving a TICI 2c revascularization via right femoral approach 05/07/2021 by Dr. [name].  Subjective:  Patient laying in bed intubated/sedated (weaning). Eyes intermittently open, does not follow simple commands. Daughter, son, RN at bedside. Moving all extremities. Right femoral puncture site c/d/i.   Allergies: Patient has no allergy information on record.  Medications: Prior to Admission medications   Not on File     Vital Signs: BP 117/75   Pulse (!) 57   Temp 98.4 F (36.9 C) (Axillary)   Resp 18   Ht  (1.905 m)   Wt 274 lb 4 oz (124.4 kg) Comment: bed weight  SpO2 93%   BMI 34.28 kg/m   Physical Exam Vitals and nursing note reviewed.  Constitutional:      General: He is not in acute distress.    Comments: Intubated/sedated (weaning).  Pulmonary:     Effort: Pulmonary effort is normal. No respiratory distress.     Comments: Intubated/sedated (weaning). Skin:    General: Skin is warm and dry.     Comments: Right femoral puncture site soft without active bleeding or hematoma.  Neurological:     Comments: Intubated/sedated (weaning). Intermittently opens eyes, does not follow simple commands. PERRL bilaterally. Can spontaneously move all extremities. Distal pulses (DPs) palpable bilaterally.     Imaging: CT HEAD WO CONTRAST  Result Date: 05/07/2021 CLINICAL DATA:  Left MCA peripheral occlusion status post percutaneous thrombectomy. EXAM: CT HEAD WITHOUT CONTRAST TECHNIQUE: Contiguous axial images were obtained from the base of the skull through the vertex without intravenous contrast. COMPARISON:  Intraoperative cone beam CT 05/07/2021 at 2:42 p.m., CT head 11:15  a.m. FINDINGS: Brain: There is again identified high attenuation material intravascularly within the distal left MCA branches in the region of the sylvian fissure posteriorly. There is petechial hemorrhage involving the temporal operculum in keeping with a a small cortical infarct in this region, best appreciated on axial image # 17/6. Trace subarachnoid hemorrhage is noted within the sylvian fissure at this level as well as within the sulci of the left para falcine parietal lobe. This appears stable when compared to prior cone beam CT arteriogram. There is no significant associated mass effect. No midline shift. No foci of acute intracranial hemorrhage. Ventricular size is normal. Cerebellum is unremarkable. Vascular: No new asymmetric hyperdensity within the a intravascular spaces at the skull base. Skull: Intact Sinuses/Orbits: There is opacification of the left maxillary sinus, unchanged as well as mild mucosal thickening within the ethmoid air cells bilaterally. The orbits are unremarkable. Other: Mastoid air cells and middle ear cavities are clear. IMPRESSION: Stable hyperdensity within the terminal left MCA branches in the region of the posterior left sylvian fissure in keeping with intraluminal thrombus and/or contrast as well as petechial hemorrhage involving a small adjacent cortical infarct involving the left temporal operculum. Stable small subarachnoid hemorrhage in the region of the a cortical infarct as well as the left parietal lobe. No abnormal mass effect. Electronically Signed   By: [name]   On: 05/07/2021 20:44   CT CEREBRAL PERFUSION W CONTRAST  Result Date: 05/07/2021 CLINICAL DATA:  55 year old male code stroke presentation with posterior left MCA infarct visible on plain CT. EXAM: CT ANGIOGRAPHY HEAD AND NECK CT PERFUSION BRAIN  TECHNIQUE: Multidetector CT imaging of the head and neck was performed using the standard protocol during bolus administration of intravenous contrast.  Multiplanar CT image reconstructions and MIPs were obtained to evaluate the vascular anatomy. Carotid stenosis measurements (when applicable) are obtained utilizing NASCET criteria, using the distal internal carotid diameter as the denominator. Multiphase CT imaging of the brain was performed following IV bolus contrast injection. Subsequent parametric perfusion maps were calculated using RAPID software. CONTRAST:  OMNIPAQUE IOHEXOL 350 MG/ML SOLN COMPARISON:  Plain head CT 1116 hours. FINDINGS: CT Brain Perfusion Findings: ASPECTS: 7 CBF (<30%) Volume: 75mL Perfusion (Tmax>6.0s) volume: 62mL.  Hypoperfusion index 0.5 Mismatch Volume: 77mL, mismatch ratio 1.7 Infarction Location:Posterior left MCA CTA NECK Skeleton: Carious posterior dentition. Minimal cervical spine degeneration. No acute osseous abnormality identified. Upper chest: Negative. Other neck: Postinflammatory dystrophic calcifications of the palatine tonsils. Left maxillary sinus disease. Otherwise negative. Aortic arch: Mildly tortuous aortic arch. Three vessel arch configuration without arch atherosclerosis. Right carotid system: Mildly tortuous brachiocephalic artery. Negative right CCA and right carotid bifurcation. Tortuous right ICA distal to the bulb but no stenosis to the skull base. Left carotid system: Negative left CCA. Minimal calcified plaque at the left carotid bifurcation. Tortuous left ICA below the skull base without stenosis. Vertebral arteries: Normal proximal right subclavian artery and right vertebral artery origin. The right vertebral is patent and normal to the skull base. Normal proximal left subclavian artery and left vertebral artery origin. The left vertebral is patent and normal to the skull base. CTA HEAD Posterior circulation: Codominant vertebral arteries. Patent V4 segments with no stenosis on the left, but there is a severe stenosis at the right vertebrobasilar junction best seen on series 9, image 29. Normal PICA  origins. Patent basilar artery with mild irregularity but no significant stenosis. Patent SCA and PCA origins. Posterior communicating arteries are diminutive or absent. Bilateral PCA irregularity in keeping with intracranial atherosclerosis. Moderate to severe stenosis of the right P1/P2 junction, and mild left PCA stenoses on series 8, image 17. Anterior circulation: Both ICA siphons are patent. On the left there is mild calcified plaque with no significant siphon stenosis. Normal left ophthalmic artery origin. Patent left ICA terminus. Mild right siphon calcified plaque without stenosis. Normal right ophthalmic artery origin and right ICA terminus. Patent MCA and ACA origins. Mild stenosis at the right A1 origin on series 9, image 24. Anterior communicating artery and ACA A2 segments are within normal limits. There is a moderate to severe distal left A2/pericallosal stenosis on series 10, image 23. Right MCA M1 and right MCA trifurcation are patent without stenosis. Right MCA branches are within normal limits. Left MCA M1 and left MCA trifurcation are patent without stenosis. There is occlusion of a posterior right MCA branch, distal M2 or proximal M3 seen on series 7, images 155-157. This is also on series 10, image 31. The other left MCA branches appear patent with mild irregularity. Venous sinuses: Early contrast timing, not well evaluated. Anatomic variants: None. Review of the MIP images confirms the above findings Left MCA findings reviewed in person Dr. Erick Blinks on 05/07/2021 at 1135 hours, and discussed again by telephone at 11:45 . IMPRESSION: 1. Positive for occlusion of a posterior Left MCA branch, distal M2 versus proximal M3. Associated posterior left MCA territory infarct core of 42 mL and 70 mL estimated penumbra by CTP (mismatch of 28 mL). This was discussed with Dr. Erick Blinks on 05/07/2021 beginning at 1135 hours. 2. Minimal atherosclerosis in the  neck but positive for intracranial  atherosclerosis including: - severe stenosis at the Right Vertebrobasilar junction. - moderate to severe stenoses of the Right PCA P1/P2 junction. 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 Electronically Signed   By: Odessa FlemingH  Hall M.D.   On: 05/07/2021 11:57   Portable Chest x-ray  Result Date: 05/07/2021 CLINICAL DATA:  Code stroke.  Endotracheal tube. EXAM: PORTABLE CHEST 1 VIEW COMPARISON:  None. FINDINGS: Endotracheal tube appears well positioned with tip approximately 3 cm above the carina. Cardiomegaly. Lungs are clear. No pleural effusion or pneumothorax is seen. Osseous structures about the chest are unremarkable. IMPRESSION: 1. Endotracheal tube well positioned with tip approximately 3 cm above the carina. 2. Cardiomegaly. 3. Lungs are clear. Electronically Signed   By: Bary RichardStan  Maynard M.D.   On: 05/07/2021 15:57   CT HEAD CODE STROKE WO CONTRAST  Result Date: 05/07/2021 CLINICAL DATA:  Code stroke.  55 year old male neurologic deficit. EXAM: CT HEAD WITHOUT CONTRAST TECHNIQUE: Contiguous axial images were obtained from the base of the skull through the vertex without intravenous contrast. COMPARISON:  None. FINDINGS: Brain: Confluent cytotoxic edema in the posterior left MCA territory affecting the posterosuperior temporal gyrus through the left parietal lobe. No hemorrhagic transformation. No significant mass effect. The insula, internal capsule and deep gray matter nuclei appear spared. Gray-white matter differentiation elsewhere within normal limits. No ventriculomegaly. Normal basilar cisterns. Vascular: Calcified atherosclerosis at the skull base. Suspicious hyperdensity in a posterior left MCA branch along the anterior margin of the cytotoxic edema series 2, image 17 and series 6, image 45. But no more proximal suspicious vessel hyperdensity is identified. Skull: Negative. Sinuses/Orbits: Subtotal opacification of the left maxillary sinus due to a mucosal thickening  and/or retention cyst. Other Visualized paranasal sinuses and mastoids are clear. Other: Visualized orbits and scalp soft tissues are within normal limits. ASPECTS [facility]([place] Stroke Program Early CT Score) - Ganglionic level infarction (caudate, lentiform nuclei, internal capsule, insula, M1-M3 cortex): 6 (abnormal M3) - Supraganglionic infarction (M4-M6 cortex): 1 Total score (0-10 with 10 being normal): 7 IMPRESSION: 1. 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 ASPECTS 7. 2. No hemorrhagic transformation or mass effect. Suspicion of a posterior left MCA branch occlusion at the posterior sylvian fissure. Study discussed by telephone with Dr. Erick BlinksSALMAN KHALIQDINA on 05/07/2021 at 11:20 . Electronically Signed   By: Odessa FlemingH  Hall M.D.   On: 05/07/2021 11:23   CT ANGIO HEAD CODE STROKE  Result Date: 05/07/2021 CLINICAL DATA:  55 year old male code stroke presentation with posterior left MCA infarct visible on plain CT. EXAM: CT ANGIOGRAPHY HEAD AND NECK CT PERFUSION BRAIN TECHNIQUE: Multidetector CT imaging of the head and neck was performed using the standard protocol during bolus administration of intravenous contrast. Multiplanar CT image reconstructions and MIPs were obtained to evaluate the vascular anatomy. Carotid stenosis measurements (when applicable) are obtained utilizing NASCET criteria, using the distal internal carotid diameter as the denominator. Multiphase CT imaging of the brain was performed following IV bolus contrast injection. Subsequent parametric perfusion maps were calculated using RAPID software. CONTRAST:  100mL OMNIPAQUE IOHEXOL 350 MG/ML SOLN COMPARISON:  Plain head CT 1116 hours. FINDINGS: CT Brain Perfusion Findings: ASPECTS: 7 CBF (<30%) Volume: 42mL Perfusion (Tmax>6.0s) volume: 70mL.  Hypoperfusion index 0.5 Mismatch Volume: 28mL, mismatch ratio 1.7 Infarction Location:Posterior left MCA CTA NECK Skeleton: Carious posterior dentition. Minimal cervical spine  degeneration. No acute osseous abnormality identified. Upper chest: Negative. Other neck: Postinflammatory dystrophic calcifications of the  palatine tonsils. Left maxillary sinus disease. Otherwise negative. Aortic arch: Mildly tortuous aortic arch. Three vessel arch configuration without arch atherosclerosis. Right carotid system: Mildly tortuous brachiocephalic artery. Negative right CCA and right carotid bifurcation. Tortuous right ICA distal to the bulb but no stenosis to the skull base. Left carotid system: Negative left CCA. Minimal calcified plaque at the left carotid bifurcation. Tortuous left ICA below the skull base without stenosis. Vertebral arteries: Normal proximal right subclavian artery and right vertebral artery origin. The right vertebral is patent and normal to the skull base. Normal proximal left subclavian artery and left vertebral artery origin. The left vertebral is patent and normal to the skull base. CTA HEAD Posterior circulation: Codominant vertebral arteries. Patent V4 segments with no stenosis on the left, but there is a severe stenosis at the right vertebrobasilar junction best seen on series 9, image 29. Normal PICA origins. Patent basilar artery with mild irregularity but no significant stenosis. Patent SCA and PCA origins. Posterior communicating arteries are diminutive or absent. Bilateral PCA irregularity in keeping with intracranial atherosclerosis. Moderate to severe stenosis of the right P1/P2 junction, and mild left PCA stenoses on series 8, image 17. Anterior circulation: Both ICA siphons are patent. On the left there is mild calcified plaque with no significant siphon stenosis. Normal left ophthalmic artery origin. Patent left ICA terminus. Mild right siphon calcified plaque without stenosis. Normal right ophthalmic artery origin and right ICA terminus. Patent MCA and ACA origins. Mild stenosis at the right A1 origin on series 9, image 24. Anterior communicating artery and  ACA A2 segments are within normal limits. There is a moderate to severe distal left A2/pericallosal stenosis on series 10, image 23. Right MCA M1 and right MCA trifurcation are patent without stenosis. Right MCA branches are within normal limits. Left MCA M1 and left MCA trifurcation are patent without stenosis. There is occlusion of a posterior right MCA branch, distal M2 or proximal M3 seen on series 7, images 155-157. This is also on series 10, image 31. The other left MCA branches appear patent with mild irregularity. Venous sinuses: Early contrast timing, not well evaluated. Anatomic variants: None. Review of the MIP images confirms the above findings Left MCA findings reviewed in person Dr. Erick Blinks on 05/07/2021 at 1135 hours, and discussed again by telephone at 11:45 . IMPRESSION: 1. Positive for occlusion of a posterior Left MCA branch, distal M2 versus proximal M3. Associated posterior left MCA territory infarct core of 42 mL and 70 mL estimated penumbra by CTP (mismatch of 28 mL). This was discussed with Dr. Erick Blinks on 05/07/2021 beginning at 1135 hours. 2. 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 - moderate to severe stenoses of the Right PCA P1/P2 junction. 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 Electronically Signed   By: Odessa Fleming M.D.   On: 05/07/2021 11:57   CT ANGIO NECK CODE STROKE  Result Date: 05/07/2021 CLINICAL DATA:  55 year old male code stroke presentation with posterior left MCA infarct visible on plain CT. EXAM: CT ANGIOGRAPHY HEAD AND NECK CT PERFUSION BRAIN TECHNIQUE: Multidetector CT imaging of the head and neck was performed using the standard protocol during bolus administration of intravenous contrast. Multiplanar CT image reconstructions and MIPs were obtained to evaluate the vascular anatomy. Carotid stenosis  measurements (when applicable) are obtained utilizing NASCET criteria, using the distal internal carotid diameter as the denominator. Multiphase CT  imaging of the brain was performed following IV bolus contrast injection. Subsequent parametric perfusion maps were calculated using RAPID software. CONTRAST:  OMNIPAQUE IOHEXOL 350 MG/ML SOLN COMPARISON:  Plain head CT 1116 hours. FINDINGS: CT Brain Perfusion Findings: ASPECTS: 7 CBF (<30%) Volume: 42mL Perfusion (Tmax>6.0s) volume: 70mL.  Hypoperfusion index 0.5 Mismatch Volume: 28mL, mismatch ratio 1.7 Infarction Location:Posterior left MCA CTA NECK Skeleton: Carious posterior dentition. Minimal cervical spine degeneration. No acute osseous abnormality identified. Upper chest: Negative. Other neck: Postinflammatory dystrophic calcifications of the palatine tonsils. Left maxillary sinus disease. Otherwise negative. Aortic arch: Mildly tortuous aortic arch. Three vessel arch configuration without arch atherosclerosis. Right carotid system: Mildly tortuous brachiocephalic artery. Negative right CCA and right carotid bifurcation. Tortuous right ICA distal to the bulb but no stenosis to the skull base. Left carotid system: Negative left CCA. Minimal calcified plaque at the left carotid bifurcation. Tortuous left ICA below the skull base without stenosis. Vertebral arteries: Normal proximal right subclavian artery and right vertebral artery origin. The right vertebral is patent and normal to the skull base. Normal proximal left subclavian artery and left vertebral artery origin. The left vertebral is patent and normal to the skull base. CTA HEAD Posterior circulation: Codominant vertebral arteries. Patent V4 segments with no stenosis on the left, but there is a severe stenosis at the right vertebrobasilar junction best seen on series 9, image 29. Normal PICA origins. Patent basilar artery with mild irregularity but no significant stenosis. Patent SCA and PCA origins.  Posterior communicating arteries are diminutive or absent. Bilateral PCA irregularity in keeping with intracranial atherosclerosis. Moderate to severe stenosis of the right P1/P2 junction, and mild left PCA stenoses on series 8, image 17. Anterior circulation: Both ICA siphons are patent. On the left there is mild calcified plaque with no significant siphon stenosis. Normal left ophthalmic artery origin. Patent left ICA terminus. Mild right siphon calcified plaque without stenosis. Normal right ophthalmic artery origin and right ICA terminus. Patent MCA and ACA origins. Mild stenosis at the right A1 origin on series 9, image 24. Anterior communicating artery and ACA A2 segments are within normal limits. There is a moderate to severe distal left A2/pericallosal stenosis on series 10, image 23. Right MCA M1 and right MCA trifurcation are patent without stenosis. Right MCA branches are within normal limits. Left MCA M1 and left MCA trifurcation are patent without stenosis. There is occlusion of a posterior right MCA branch, distal M2 or proximal M3 seen on series 7, images 155-157. This is also on series 10, image 31. The other left MCA branches appear patent with mild irregularity. Venous sinuses: Early contrast timing, not well evaluated. Anatomic variants: None. Review of the MIP images confirms the above findings Left MCA findings reviewed in person Dr. Erick Blinks on 05/07/2021 at 1135 hours, and discussed again by telephone at 11:45 . IMPRESSION: 1. Positive for occlusion of a posterior Left MCA branch, distal M2 versus proximal M3. Associated posterior left MCA territory infarct core of 42 mL and 70 mL estimated penumbra by CTP (mismatch of 28 mL). This was discussed with Dr. Erick Blinks on 05/07/2021 beginning at 1135 hours. 2. 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 - moderate to severe  stenoses of the Right PCA P1/P2 junction. 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 Electronically Signed   By: Odessa Fleming M.D.   On: 05/07/2021 11:57  Labs:  CBC: Recent Labs    05/07/21 1141 05/07/21 1244 05/07/21 1620 05/08/21 0412  WBC 11.1* 10.5  --  9.1  HGB 16.9 14.8 16.0 13.1  HCT 50.7 44.6 47.0 39.4  PLT 270 276  --  283    COAGS: Recent Labs    05/07/21 1141  INR 1.1  APTT 34    BMP: Recent Labs    05/07/21 1121 05/07/21 1141 05/07/21 1620 05/08/21 0412  NA 138 136 140 138  K 3.6 3.7 3.5 3.3*  CL 103 103  --  112*  CO2  --  22  --  20*  GLUCOSE 187* 185*  --  115*  BUN 18 16  --  20  CALCIUM  --  9.0  --  7.5*  CREATININE 1.00 1.21  --  1.79*  GFRNONAA  --  >60  --  44*    LIVER FUNCTION TESTS: Recent Labs    05/07/21 1141  BILITOT 0.6  AST 15  ALT 21  ALKPHOS 95  PROT 7.5  ALBUMIN 3.6    Assessment and Plan:  History of acute CVA s/p cerebral arteriogram with emergent mechanical thrombectomy of left MCA M2/M3 junction occlusion achieving a TICI 2c revascularization via right femoral approach 05/07/2021 by Dr. [name]. Patient intubated/sedated (weaning), does not follow simple commands, moving all extremities. Right femoral puncture site stable, distal pulses (DPs) palpable bilaterally. For possible extubation and MR brain today. Further plans per neurology/CCM- appreciate and agree with management. NIR to follow.   Electronically Signed: Elwin Mocha, PA-C 05/08/2021, 9:53 AM   I spent a total of 15 Minutes at the the patient's bedside AND on the patient's hospital floor or unit, greater than 50% of which was counseling/coordinating care for CVA s/p revascularization.

## 2021-05-08 NOTE — Procedures (Signed)
Intubation Procedure Note  MATHIEU SCHLOEMER  [id number]  06-29-1966  Date:05/08/21  Time:2:33 PM   Provider Performing:Makaylen Thieme Gaynell Face    Procedure: Intubation (31500)  Indication(s) Respiratory Failure  Consent Unable to obtain consent due to emergent nature of procedure.   Anesthesia Versed, Propofol and see mar   Time Out Verified patient identification, verified procedure, site/side was marked, verified correct patient position, special equipment/implants available, medications/allergies/relevant history reviewed, required imaging and test results available.   Sterile Technique Usual hand hygeine, masks, and gloves were used   Procedure Description Patient positioned in bed supine.  Sedation given as noted above.  Patient was intubated with endotracheal tube using Glidescope.  View was Grade 1 full glottis .  Number of attempts was 1.  Colorimetric CO2 detector was consistent with tracheal placement.   Complications/Tolerance None; patient tolerated the procedure well. Chest X-ray is ordered to verify placement.   EBL none   Specimen(s) None

## 2021-05-08 NOTE — Progress Notes (Signed)
PT Cancellation Note  Patient Details Name: Peter Lucas MRN: [id number] DOB: September 18, 1966   Cancelled Treatment:    Reason Eval/Treat Not Completed: Patient not medically ready (Bedrest. Intubated post thrombectomy).  Lillia Pauls, PT, DPT Acute Rehabilitation Services Pager [phone] Office [phone]    Norval Morton 05/08/2021, 6:43 AM

## 2021-05-09 ENCOUNTER — Encounter (HOSPITAL_COMMUNITY): Payer: Self-pay | Admitting: Radiology

## 2021-05-09 ENCOUNTER — Inpatient Hospital Stay (HOSPITAL_COMMUNITY): Payer: BC Managed Care – PPO

## 2021-05-09 DIAGNOSIS — N179 Acute kidney failure, unspecified: Secondary | ICD-10-CM

## 2021-05-09 DIAGNOSIS — J810 Acute pulmonary edema: Secondary | ICD-10-CM

## 2021-05-09 LAB — BASIC METABOLIC PANEL
Anion gap: 10 (ref 5–15)
BUN: 25 mg/dL — ABNORMAL HIGH (ref 6–20)
CO2: 22 mmol/L (ref 22–32)
Calcium: 7.9 mg/dL — ABNORMAL LOW (ref 8.9–10.3)
Chloride: 105 mmol/L (ref 98–111)
Creatinine, Ser: 2.01 mg/dL — ABNORMAL HIGH (ref 0.61–1.24)
GFR, Estimated: 38 mL/min — ABNORMAL LOW (ref >60)
Glucose, Bld: 146 mg/dL — ABNORMAL HIGH (ref 70–99)
Potassium: 3.6 mmol/L (ref 3.5–5.1)
Sodium: 137 mmol/L (ref 135–145)

## 2021-05-09 LAB — GLUCOSE, CAPILLARY
Glucose-Capillary: 163 mg/dL — ABNORMAL HIGH (ref 70–99)
Glucose-Capillary: 140 mg/dL — ABNORMAL HIGH (ref 70–99)
Glucose-Capillary: 133 mg/dL — ABNORMAL HIGH (ref 70–99)
Glucose-Capillary: 192 mg/dL — ABNORMAL HIGH (ref 70–99)
Glucose-Capillary: 211 mg/dL — ABNORMAL HIGH (ref 70–99)
Glucose-Capillary: 211 mg/dL — ABNORMAL HIGH (ref 70–99)
Glucose-Capillary: 153 mg/dL — ABNORMAL HIGH (ref 70–99)

## 2021-05-09 LAB — HEMOGLOBIN A1C
Hgb A1c MFr Bld: 10.1 % — ABNORMAL HIGH (ref 4.8–5.6)
Mean Plasma Glucose: 243 mg/dL

## 2021-05-09 LAB — CBC
HCT: 43.2 % (ref 39.0–52.0)
Hemoglobin: 13.7 g/dL (ref 13.0–17.0)
MCH: 28 pg (ref 26.0–34.0)
MCHC: 31.7 g/dL (ref 30.0–36.0)
MCV: 88.2 fL (ref 80.0–100.0)
Platelets: 188 10*3/uL (ref 150–400)
RBC: 4.9 MIL/uL (ref 4.22–5.81)
RDW: 14.6 % (ref 11.5–15.5)
WBC: 9.4 10*3/uL (ref 4.0–10.5)
nRBC: 0 % (ref 0.0–0.2)

## 2021-05-09 LAB — TRIGLYCERIDES: Triglycerides: 501 mg/dL — ABNORMAL HIGH (ref <150)

## 2021-05-09 MED ORDER — CHLORHEXIDINE GLUCONATE 0.12 % MT SOLN
15.0000 mL | Freq: Two times a day (BID) | OROMUCOSAL | Status: DC
  Administered 2021-05-09 – 2021-05-12 (×6): 15 mL via OROMUCOSAL
  Filled 2021-05-09 (×2): qty 15

## 2021-05-09 MED ORDER — MIDAZOLAM HCL 2 MG/2ML IJ SOLN
2.0000 mg | INTRAMUSCULAR | Status: DC
  Filled 2021-05-09: qty 2

## 2021-05-09 MED ORDER — SENNOSIDES-DOCUSATE SODIUM 8.6-50 MG PO TABS: 1.0000 | ORAL_TABLET | Freq: Every evening | ORAL | Status: DC | PRN

## 2021-05-09 MED ORDER — INSULIN ASPART 100 UNIT/ML IJ SOLN
0.0000 [IU] | INTRAMUSCULAR | Status: DC
  Administered 2021-05-09 (×2): 5 [IU] via SUBCUTANEOUS
  Administered 2021-05-10 – 2021-05-11 (×9): 3 [IU] via SUBCUTANEOUS
  Administered 2021-05-11: 2 [IU] via SUBCUTANEOUS
  Administered 2021-05-11 (×2): 3 [IU] via SUBCUTANEOUS
  Administered 2021-05-12: 2 [IU] via SUBCUTANEOUS
  Administered 2021-05-12 (×3): 3 [IU] via SUBCUTANEOUS

## 2021-05-09 MED ORDER — ORAL CARE MOUTH RINSE
15.0000 mL | Freq: Two times a day (BID) | OROMUCOSAL | Status: DC
  Administered 2021-05-09 – 2021-05-12 (×4): 15 mL via OROMUCOSAL

## 2021-05-09 MED ORDER — NICOTINE 21 MG/24HR TD PT24
21.0000 mg | MEDICATED_PATCH | Freq: Every day | TRANSDERMAL | Status: DC
  Administered 2021-05-09 – 2021-05-12 (×4): 21 mg via TRANSDERMAL
  Filled 2021-05-09 (×4): qty 1

## 2021-05-09 MED ORDER — PANTOPRAZOLE SODIUM 40 MG PO PACK: 40.0000 mg | PACK | Freq: Every day | ORAL | Status: DC

## 2021-05-09 NOTE — Procedures (Signed)
Extubation Procedure Note  Patient Details:   Name: BILL YOHN DOB: [birth year] MRN: [id number]   Airway Documentation:    Vent end date: 05/09/21 Vent end time: 0950   Evaluation  O2 sats: stable throughout Complications: No apparent complications Patient did tolerate procedure well. Bilateral Breath Sounds: Diminished, Rhonchi   No but patient was making effort.  Pt extubated to 5L [place] without any complications. Positive cuff leak noted, no stridor heard at this time. RT will continue to monitor.    Memory Argue 05/09/2021, 9:55 AM

## 2021-05-09 NOTE — Progress Notes (Addendum)
INTERVAL HISTORY His twin sons are at the bedside. Updated with family and answered their questions. Pt was just extubated.  He is globally aphasic and not following commands, but is alert and protecting his airway. F/u MRI planned for this afternoon.  Vital signs are stable.  Neurological exam unchanged. Vitals:   05/09/21 1230 05/09/21 1300 05/09/21 1330 05/09/21 1400  BP: (!) 160/93 (!) 152/83 (!) 152/87 (!) 151/90  Pulse: 80 78 81 82  Resp: 16 (!) 24 18 20   Temp:      TempSrc:      SpO2: 94% (!) 89% 92% 90%  Weight:      Height:       CBC:  Recent Labs  Lab 05/07/21 1141 05/07/21 1244 05/08/21 0412 05/08/21 1715 05/09/21 0824  WBC 11.1*   < > 9.1  --  9.4  NEUTROABS 8.9*  --  6.3  --   --   HGB 16.9   < > 13.1 13.9 13.7  HCT 50.7   < > 39.4 41.0 43.2  MCV 84.2   < > 85.3  --  88.2  PLT 270   < > 283  --  188   < > = values in this interval not displayed.   Basic Metabolic Panel:  Recent Labs  Lab 05/08/21 0912 05/08/21 1657 05/08/21 1715 05/09/21 0824  NA  --  137 141 137  K  --  4.5 4.2 3.6  CL  --  109  --  105  CO2  --  19*  --  22  GLUCOSE  --  126*  --  146*  BUN  --  22*  --  25*  CREATININE  --  2.19*  --  2.01*  CALCIUM  --  7.7*  --  7.9*  MG 1.9  --   --   --    Lipid Panel:  Recent Labs  Lab 05/08/21 0412 05/09/21 0824  CHOL 135  --   TRIG 144 501*  HDL 20*  --   CHOLHDL 6.8  --   VLDL 29  --   LDLCALC 86  --    HgbA1c:  Recent Labs  Lab 05/08/21 0412  HGBA1C 10.1*   Urine Drug Screen:  Recent Labs  Lab 05/07/21 1704  LABOPIA NONE DETECTED  COCAINSCRNUR NONE DETECTED  LABBENZ NONE DETECTED  AMPHETMU NONE DETECTED  THCU NONE DETECTED  LABBARB NONE DETECTED    Alcohol Level  Recent Labs  Lab 05/07/21 1141  ETH <10    IMAGING past 24 hours DG CHEST PORT 1 VIEW  Result Date: 05/09/2021 CLINICAL DATA:  Hypoxia EXAM: PORTABLE CHEST 1 VIEW COMPARISON:  May 08, 2021 FINDINGS: Endotracheal tube tip is 5.5 cm above the  carina. No pneumothorax. There is persistent cardiomegaly with pulmonary venous hypertension. There is ill-defined opacity in each lower lung region, stable. No new opacity. No adenopathy. No bone lesions. IMPRESSION: Endotracheal tube as described without pneumothorax. Ill-defined opacity in each lung base may represent bilateral pneumonia is stable. There may be pulmonary edema as well. Appearance stable. Stable cardiomegaly with a degree of cardiomegaly with pulmonary vascular congestion. Electronically Signed   By: May 10, 2021 III M.D.   On: 05/09/2021 08:04   ECHOCARDIOGRAM COMPLETE BUBBLE STUDY  Result Date: 05/08/2021    ECHOCARDIOGRAM REPORT   Patient Name:   Peter Lucas Date of Exam: 05/08/2021 Medical Rec #:  07/08/2021      Height:       75.0  in Accession #:    [id number]     Weight:       274.3 lb Date of Birth:  17-Jul-1966      BSA:          2.510 m Patient Age:    55 years       BP:           104/61 mmHg Patient Gender: M              HR:           65 bpm. Exam Location:  Inpatient Procedure: 2D Echo and Saline Contrast Bubble Study Indications:    stroke  History:        Patient has no prior history of Echocardiogram examinations.  Sonographer:    Delcie RochLauren Pennington Referring Phys: [id number] [facility] KHALIQDINA IMPRESSIONS  1. Left ventricular ejection fraction, by estimation, is 55 to 60%. The left ventricle has normal function. The left ventricle has no regional wall motion abnormalities. There is moderate left ventricular hypertrophy. Left ventricular diastolic parameters are indeterminate.  2. Right ventricular systolic function is normal. The right ventricular size is normal. Tricuspid regurgitation signal is inadequate for assessing PA pressure.  3. The mitral valve is grossly normal. Trivial mitral valve regurgitation.  4. The aortic valve is tricuspid. Aortic valve regurgitation is not visualized.  5. The inferior vena cava is normal in size with greater than 50% respiratory variability,  suggesting right atrial pressure of 3 mmHg.  6. No visualization of agitated saline bubbles in right heart with reported injection from IV access both upper extremities, possibly inadequate agitation prior to injection. Consider repeat study if clinically indicated. FINDINGS  Left Ventricle: Left ventricular ejection fraction, by estimation, is 55 to 60%. The left ventricle has normal function. The left ventricle has no regional wall motion abnormalities. The left ventricular internal cavity size was normal in size. There is  moderate left ventricular hypertrophy. Left ventricular diastolic parameters are indeterminate. Right Ventricle: The right ventricular size is normal. No increase in right ventricular wall thickness. Right ventricular systolic function is normal. Tricuspid regurgitation signal is inadequate for assessing PA pressure. Left Atrium: Left atrial size was normal in size. Right Atrium: Right atrial size was normal in size. Pericardium: There is no evidence of pericardial effusion. Mitral Valve: The mitral valve is grossly normal. Trivial mitral valve regurgitation. Tricuspid Valve: The tricuspid valve is grossly normal. Tricuspid valve regurgitation is trivial. Aortic Valve: The aortic valve is tricuspid. There is mild aortic valve annular calcification. Aortic valve regurgitation is not visualized. Pulmonic Valve: The pulmonic valve was grossly normal. Pulmonic valve regurgitation is trivial. Aorta: The aortic root is normal in size and structure. Venous: The inferior vena cava is normal in size with greater than 50% respiratory variability, suggesting right atrial pressure of 3 mmHg. IAS/Shunts: No atrial level shunt detected by color flow Doppler. Agitated saline contrast was given intravenously to evaluate for intracardiac shunting.  LEFT VENTRICLE PLAX 2D LVIDd:         5.70 cm  Diastology LVIDs:         3.50 cm  LV e' medial:    4.90 cm/s LV PW:         1.20 cm  LV E/e' medial:  12.1 LV IVS:         1.40 cm  LV e' lateral:   5.77 cm/s LVOT diam:     2.20 cm  LV E/e' lateral: 10.2 LV SV:  73 LV SV Index:   29 LVOT Area:     3.80 cm  RIGHT VENTRICLE             IVC RV S prime:     16.80 cm/s  IVC diam: 1.80 cm TAPSE (M-mode): 1.8 cm LEFT ATRIUM             Index       RIGHT ATRIUM           Index LA diam:        4.50 cm 1.79 cm/m  RA Area:     20.60 cm LA Vol (A2C):   68.7 ml 27.38 ml/m RA Volume:   59.60 ml  23.75 ml/m LA Vol (A4C):   48.7 ml 19.41 ml/m LA Biplane Vol: 59.9 ml 23.87 ml/m  AORTIC VALVE LVOT Vmax:   111.00 cm/s LVOT Vmean:  66.000 cm/s LVOT VTI:    0.192 m  AORTA Ao Root diam: 3.70 cm Ao Asc diam:  3.60 cm MITRAL VALVE MV Area (PHT): 2.95 cm    SHUNTS MV Decel Time: 257 msec    Systemic VTI:  0.19 m MV E velocity: 59.10 cm/s  Systemic Diam: 2.20 cm MV A velocity: 76.70 cm/s MV E/A ratio:  0.77 Nona Dell MD Electronically signed by Nona Dell MD Signature Date/Time: 05/08/2021/5:08:48 PM    Final     PHYSICAL EXAM GENERAL: Obese middle-aged Caucasian male mild-mod distress HEENT: Head is atraumatic, normocephalic. Eyes: PERRL.  LUNGS: deep breathing sounds, just extubated CARDIOVASCULAR: Regular rate and rhythm  ABDOMEN: Bowel sounds present.  Ext : normal pulse distally  NEUROLOGICAL EXAM MS: No sedation, extubated. He is alert, but poorly attends. No attempt to speak. Not following commands.  CN 2-12: PERRL, Blinks to visual threatening BL, EOM seem intact, but difficult to test d/t not following commands. Mild R facial asymmetry, no facial sensory deficits Global aphasia.  Left gaze deviation.  Does not blink to threat on the right. Motor: Moving all ext spontanously, unable to assess full stregth. Tone normal. No Abnormal movements identified.  Sensory:  No focal sensory loss noted in limited exam Coordination: Unable to assess Gait was not tested considering pt's condition  ASSESSMENT/PLAN 1. Acute/subacute ischemic stroke within the L  Parietotemporal area in the setting of distal LM2 occlusion. ASPECT 7. S/P IR thrombectomy initial with TICI2c with reoccluded vessel.   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 However, cardioemblic event cannot be completely ruled out. Of note, family reported what seems to be an episode of Amaurosis fugax last month they think it was the R eye.     Vascular risk factors: Smoking, uncontrolled DM2, hga1c 10.,  doesn't follow with PCP.   CT head  Posterior left MCA infarct with fairly well-developed cytotoxic edema. ASPECTS 7. CTA head & neck occlusion of a posterior Left MCA branch, distal M2 versus proximal M3. 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 Moderately severe stenosis of R P1/P2 and L pericallosal branch.  CT perfusion posterior left MCA territory infarct core of 42 mL and 70 mL estimated penumbra by CTP (mismatch of 28 mL)  Repeated CT head with petechial hemorrhage involving a small adjacent cortical infarct involving the left temporal operculum. Stable small subarachnoid hemorrhage in the region of the a cortical infarct as well as the left parietal lobe.  Repeated CT head this afternoon showed evolving L parietotemporal infarct with less pronounced hyperdensity within the sylvian fissure most likely contrast staining.  MRI Pending this afternoon  2D Echo Pending   LDL 86  HgbA1c 10.1  Lovenox for VTE prophylaxis Diet Order    None      No antithrombotics prior to admission, Will start pt on ASA 300 mg PR for now  Therapy recommendations:  Pending  Disposition:  Pending  Hypertension  Home meds: None  . Mainatin SBP <160 for now.  . Was on Neo early on the morning due to low bp while on Porpofol then became HTN after extubated. On Clevedipin drip NOW . Will consider starting Oral meds when able to swallow . Long-term BP goal <!40/90  Hyperlipidemia  Home meds:  None   LDL 86, goal < 70  High intensity statin Lipitor 40 mg qhs     Hyperglycemia:   Home meds: None   HgbA1c 10.1, goal < 7.0  CBGs Recent Labs    05/09/21 0314 05/09/21 0732 05/09/21 1117  GLUCAP 140* 133* 192*      SSI  Other Stroke Risk Factors   Cigarette smoker : Needs counseling.   Obesity, Body mass index is 34.28 kg/m., BMI >/= 30 associated with increased stroke risk, recommend weight loss, diet and exercise as appropriate   Suspected Obstructive sleep apnea, consider sleep study as an outpt.   AKI Critical care team on board Stritct I/O Consider Nephro consult  Respiratory failure,  Extubated ICU team on board.   Hypokalemia -replaced - Monitor   Inadequate PO intake -if unable top extubate start feeding tube -SLP following     Hospital day # 2  Patient was reintubated yesterday for pulmonary edema with seems to be doing better today and was extubated this morning..  Continue close observation for his respiratory status as per CCM team..  Check MRI scan later today.  Speech therapy for swallow eval..  Will likely need a feeding tube.  Discussed with patient's son at the bedside and answered questions.  Discussed with Dr. Merrily Pew critical care medicine. This patient is critically ill and at significant risk of neurological worsening, death and care requires constant monitoring of vital signs, hemodynamics,respiratory and cardiac monitoring, extensive review of multiple databases, frequent neurological assessment, discussion with family, other specialists and medical decision making of high complexity.I have made any additions or clarifications directly to the above note.This critical care time does not reflect procedure time, or teaching time or supervisory time of PA/NP/Med Resident etc but could involve care discussion time.  I spent 30 minutes of neurocritical care time  in the care of  this patient.  Delia Heady, MD  To contact Stroke Continuity provider, please refer to [email or web address]. After hours, contact  General Neurology

## 2021-05-09 NOTE — Progress Notes (Signed)
Rehab Admissions Coordinator Note:  Patient was screened by Clois Dupes for appropriateness for an Inpatient Acute Rehab Consult per therapy recs..  At this time, we are recommending Inpatient Rehab consult. I will place order per protocol.  Clois Dupes RN MSN 05/09/2021, 2:45 PM  I can be reached at [phone].
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
## 2021-05-09 NOTE — Evaluation (Signed)
Physical Therapy Evaluation Patient Details Name: Peter Lucas MRN: [id number] DOB: September 06, 1966 Today's Date: 05/09/2021   History of Present Illness  55 yo male presenting 6/4 with dense aphasia. Imaging revealed L MCA infarct, and the pt is now s/p thrombectomy with revascularization with reocclusion. Attempted extubation 6/5, but required reintubation later that day due to hypoxia and worsening neuro exam. Pt with no significant PMH on file other than heavy tobacco use, but does not consistently access medical care.    Clinical Impression  Pt in bed upon arrival of PT, agreeable to evaluation at this time. Prior to admission the pt was independent with all mobility and ADLs, living alone and working full time. The pt now presents with limitations in functional mobility, strength, coordination, awareness, and command following due to above dx, and will continue to benefit from skilled PT to address these deficits. The pt was able to maintain alertness through entire session, able to inconsistently follow commands with multimodal cues but unable to follow any commands with verbal-only cues. The pt was able to demo purposeful movement in BLE with good strength (SLR of both legs to assist PT in removing SCDs), but was unable to complete MMT due to limited command following. The pt was able to tolerate sitting EOB for 3-5 min and short static stand at EOB with BP stable (see below). The pt will continue to benefit from skilled PT to address deficits in functional mobility, independence, strength, and stability to maximize functional return.   VITALS:  - supine at start of session - BP: 211/128 (149); - supine after BP meds - BP: 178/110 (129); - sitting EOB - BP: 181/112 (123); - chair position at end of session - BP: 177/98 (119)    Follow Up Recommendations CIR    Equipment Recommendations   (defer to post acute)    Recommendations for Other Services Rehab consult     Precautions /  Restrictions Precautions Precautions: Fall Precaution Comments: SBP <180; globally aphasic Restrictions Weight Bearing Restrictions: No      Mobility  Bed Mobility Overal bed mobility: Needs Assistance Bed Mobility: Rolling;Sidelying to Sit;Sit to Sidelying Rolling: Mod assist Sidelying to sit: Mod assist;+2 for physical assistance Supine to sit: +2 for physical assistance;Mod assist Sit to supine: +2 for physical assistance;Mod assist Sit to sidelying: Mod assist;+2 for physical assistance General bed mobility comments: modA to complete roll and initiate movement at all extremities. modA to rise up to sitting initially, but was later able to complete from sidelying with minG only.    Transfers Overall transfer level: Needs assistance Equipment used: 2 person hand held assist Transfers: Sit to/from Stand Sit to Stand: Mod assist;Max assist;+2 physical assistance         General transfer comment: modA to power up and steady in standing with L lateral lean and wt shift. Pt tolerated for 1-2 min prior to needing to return to sitting. BP stable no buckling  Ambulation/Gait             General Gait Details: deferred due to pt fatigue     Modified Rankin (Stroke Patients Only) Modified Rankin (Stroke Patients Only) Pre-Morbid Rankin Score: No symptoms Modified Rankin: Severe disability     Balance Overall balance assessment: Needs assistance Sitting-balance support: Bilateral upper extremity supported;Feet supported Sitting balance-Leahy Scale: Poor Sitting balance - Comments: initially reliant on modA to steady, then minG with UE support Postural control: Right lateral lean Standing balance support: Bilateral upper extremity supported Standing balance-Leahy Scale:  Poor Standing balance comment: reliant on BUE support from therapists                             Pertinent Vitals/Pain Pain Assessment: Faces Faces Pain Scale: No hurt Pain Location:  withdrew to noxious stimuli BLE Pain Intervention(s): Monitored during session    Home Living Family/patient expects to be discharged to:: Private residence Living Arrangements: Alone Available Help at Discharge: Family;Available PRN/intermittently Type of Home: House Home Access: Stairs to enter   [employer] of Steps: 3 Home Layout: One level Home Equipment: None      Prior Function Level of Independence: Independent         Comments: works for food Insurance underwriter   Dominant Hand: Right    Extremity/Trunk Assessment   Upper Extremity Assessment Upper Extremity Assessment: Defer to OT evaluation RUE Deficits / Details: tricep firing from side to sit but other wise no activation noted at this time. Pt no awareness to R UE when present in visual field RUE Sensation: decreased light touch;decreased proprioception RUE Coordination: decreased fine motor;decreased gross motor    Lower Extremity Assessment Lower Extremity Assessment: Difficult to assess due to impaired cognition;RLE deficits/detail;LLE deficits/detail RLE Deficits / Details: pt responded immediately to noxious stim and moved feet in response to light touch, was able to complete SLR but formal MMT and sensation testing limited by aphasia LLE Deficits / Details: pt responded immediately to noxious stim and moved feet in response to light touch, was able to complete SLR but formal MMT and sensation testing limited by aphasia    Cervical / Trunk Assessment Cervical / Trunk Assessment: Other exceptions Cervical / Trunk Exceptions: large body habitus  Communication   Communication: Expressive difficulties;Receptive difficulties  Cognition Arousal/Alertness: Awake/alert Behavior During Therapy: WFL for tasks assessed/performed Overall Cognitive Status: Difficult to assess Area of Impairment: Following commands                   Current Attention Level: Sustained   Following  Commands: Follows one step commands inconsistently   Awareness:  (no verbalizations)   General Comments: Pt able to follow commands inconsistently when given multimodal cues, but was unable to follow any cue to verbal cues only at this time. Consistently turning his head to auditory stimuli, mirroring emotions and gestures      General Comments General comments (skin integrity, edema, etc.): BP elevated in supine at start of session, RN alerted and arrived to deliver BP meds. BP then remained under BP goal of 180. BP: 211/128 (149) in supine; 178/110 (129) in supine post meds; 181/112 (123) sitting EOB; 177/98 (119) at end of session in chair position    Exercises     Assessment/Plan    PT Assessment Patient needs continued PT services  PT Problem List Decreased strength;Decreased range of motion;Decreased balance;Decreased activity tolerance;Decreased mobility;Decreased coordination;Decreased cognition;Decreased knowledge of use of DME;Decreased safety awareness;Decreased knowledge of precautions       PT Treatment Interventions DME instruction;Gait training;Stair training;Functional mobility training;Therapeutic activities;Therapeutic exercise;Balance training;Neuromuscular re-education;Patient/family education    PT Goals (Current goals can be found in the Care Plan section)  Acute Rehab PT Goals Patient Stated Goal: none stated PT Goal Formulation: With patient Time For Goal Achievement: 05/23/21 Potential to Achieve Goals: Good    Frequency Min 4X/week   Barriers to discharge        Co-evaluation PT/OT/SLP Co-Evaluation/Treatment: Yes Reason for Co-Treatment:  Complexity of the patient's impairments (multi-system involvement);Necessary to address cognition/behavior during functional activity;For patient/therapist safety;To address functional/ADL transfers PT goals addressed during session: Mobility/safety with mobility;Balance OT goals addressed during session: ADL's and  self-care;Proper use of Adaptive equipment and DME;Strengthening/ROM       AM-PAC PT "6 Clicks" Mobility  Outcome Measure Help needed turning from your back to your side while in a flat bed without using bedrails?: A Lot Help needed moving from lying on your back to sitting on the side of a flat bed without using bedrails?: A Lot Help needed moving to and from a bed to a chair (including a wheelchair)?: A Lot Help needed standing up from a chair using your arms (e.g., wheelchair or bedside chair)?: A Lot Help needed to walk in hospital room?: Total Help needed climbing 3-5 steps with a railing? : Total 6 Click Score: 10    End of Session Equipment Utilized During Treatment: Gait belt;Oxygen Activity Tolerance: Patient tolerated treatment well Patient left: in bed;with call bell/phone within reach;with bed alarm set;with family/visitor present;with SCD's reapplied Nurse Communication: Mobility status PT Visit Diagnosis: Unsteadiness on feet (R26.81);Other abnormalities of gait and mobility (R26.89);Muscle weakness (generalized) (M62.81)    Time: 2330-0762 PT Time Calculation (min) (ACUTE ONLY): 27 min   Charges:   PT Evaluation $PT Eval Moderate Complexity: 1 Mod          Rolm Baptise, PT, DPT   Acute Rehabilitation Department Pager #: [phone]  Gaetana Michaelis 05/09/2021, 12:49 PM

## 2021-05-09 NOTE — Evaluation (Signed)
Occupational Therapy Evaluation Patient Details Name: Peter Lucas MRN: [id number] DOB: Jul 28, 1966 Today's Date: 05/09/2021    History of Present Illness 55 yo male presenting 6/4 with dense aphasia. Imaging revealed L MCA infarct, and the pt is now s/p thrombectomy with revascularization with reocclusion. Attempted extubation 6/5, but required reintubation later that day due to hypoxia and worsening neuro exam. Pt with no significant PMH on file other than heavy tobacco use, but does not consistently access medical care.   Clinical Impression   PT admitted with L MCA . Pt currently with functional limitiations due to the deficits listed below (see OT problem list). 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 Pt currently with expressive/ receptive aphasia deficits, R side weakness UE/LE and not speaking at all. Twin sons present during session. Pt lives alone with extended family in the area . Pt will benefit from skilled OT to increase their independence and safety with adls and balance to allow discharge CIR.     Follow Up Recommendations  CIR    Equipment Recommendations  3 in 1 bedside commode    Recommendations for Other Services Rehab consult     Precautions / Restrictions Precautions Precautions: Fall Precaution Comments: SBP <180; globally aphasic Restrictions Weight Bearing Restrictions: No      Mobility Bed Mobility Overal bed mobility: Needs Assistance Bed Mobility: Rolling;Sidelying to Sit;Sit to Sidelying Rolling: Mod assist Sidelying to sit: Mod assist;+2 for physical assistance Supine to sit: +2 for physical assistance;Mod assist Sit to supine: +2 for physical assistance;Mod assist Sit to sidelying: Mod assist;+2 for physical assistance General bed mobility comments: modA to complete roll and initiate movement at all extremities. modA to rise up to sitting initially, but was later able to complete from sidelying with minG only.    Transfers Overall  transfer level: Needs assistance Equipment used: 2 person hand held assist Transfers: Sit to/from Stand Sit to Stand: Mod assist;Max assist;+2 physical assistance         General transfer comment: modA to power up and steady in standing with L lateral lean and wt shift. Pt tolerated for 1-2 min prior to needing to return to sitting. BP stable no buckling    Balance Overall balance assessment: Needs assistance Sitting-balance support: Bilateral upper extremity supported;Feet supported Sitting balance-Leahy Scale: Poor Sitting balance - Comments: initially reliant on modA to steady, then minG with UE support Postural control: Right lateral lean Standing balance support: Bilateral upper extremity supported Standing balance-Leahy Scale: Poor Standing balance comment: reliant on BUE support from therapists                           ADL either performed or assessed with clinical judgement   ADL Overall ADL's : Needs assistance/impaired                                       General ADL Comments: total (A) at this time due to aphasia     Vision Baseline Vision/History: Wears glasses Wears Glasses: Reading only Vision Assessment?: Yes Eye Alignment: Impaired (comment) Tracking/Visual Pursuits: Impaired - to be further tested in functional context;Unable to hold eye position out of midline Additional Comments: pt noted to have downward beating nystagmus with sitting. pt noted to have R eye inward rotation of eye with supine position. Sons report that one event in last week of patient  have diplopia     Perception     Praxis      Pertinent Vitals/Pain Pain Assessment: Faces Faces Pain Scale: No hurt Pain Location: withdrew to noxious stimuli BLE Pain Intervention(s): Monitored during session     Hand Dominance Right   Extremity/Trunk Assessment Upper Extremity Assessment Upper Extremity Assessment: Defer to OT evaluation RUE Deficits / Details:  tricep firing from side to sit but other wise no activation noted at this time. Pt no awareness to R UE when present in visual field RUE Sensation: decreased light touch;decreased proprioception RUE Coordination: decreased fine motor;decreased gross motor   Lower Extremity Assessment Lower Extremity Assessment: Difficult to assess due to impaired cognition;RLE deficits/detail;LLE deficits/detail RLE Deficits / Details: pt responded immediately to noxious stim and moved feet in response to light touch, was able to complete SLR but formal MMT and sensation testing limited by aphasia LLE Deficits / Details: pt responded immediately to noxious stim and moved feet in response to light touch, was able to complete SLR but formal MMT and sensation testing limited by aphasia   Cervical / Trunk Assessment Cervical / Trunk Assessment: Other exceptions Cervical / Trunk Exceptions: large body habitus   Communication Communication Communication: Expressive difficulties;Receptive difficulties   Cognition Arousal/Alertness: Awake/alert Behavior During Therapy: WFL for tasks assessed/performed Overall Cognitive Status: Difficult to assess Area of Impairment: Following commands                   Current Attention Level: Sustained   Following Commands: Follows one step commands inconsistently   Awareness:  (no verbalizations)   General Comments: Pt able to follow commands inconsistently when given multimodal cues, but was unable to follow any cue to verbal cues only at this time. Consistently turning his head to auditory stimuli, mirroring emotions and gestures   General Comments  BP elevated in supine at start of session, RN alerted and arrived to deliver BP meds. BP then remained under BP goal of 180. BP: 211/128 (149) in supine; 178/110 (129) in supine post meds; 181/112 (123) sitting EOB; 177/98 (119) at end of session in chair position    Exercises     Shoulder Instructions      Home  Living Family/patient expects to be discharged to:: Private residence Living Arrangements: Alone Available Help at Discharge: Family;Available PRN/intermittently Type of Home: House Home Access: Stairs to enter [employer] of Steps: 3   Home Layout: One level     Bathroom Shower/Tub: Producer, television/film/video: Standard     Home Equipment: None          Prior Functioning/Environment Level of Independence: Independent        Comments: works for Warehouse manager Problem List: Decreased strength;Decreased activity tolerance;Impaired balance (sitting and/or standing);Impaired vision/perception;Decreased cognition;Decreased safety awareness;Decreased knowledge of use of DME or AE;Decreased knowledge of precautions;Cardiopulmonary status limiting activity;Impaired sensation;Obesity;Impaired UE functional use      OT Treatment/Interventions: Self-care/ADL training;Neuromuscular education;Therapeutic exercise;Energy conservation;DME and/or AE instruction;Manual therapy;Modalities;Therapeutic activities;Cognitive remediation/compensation;Visual/perceptual remediation/compensation;Patient/family education;Balance training    OT Goals(Current goals can be found in the care plan section) Acute Rehab OT Goals Patient Stated Goal: none stated OT Goal Formulation: With patient/family Time For Goal Achievement: 05/23/21 Potential to Achieve Goals: Good  OT Frequency: Min 2X/week   Barriers to D/C: Decreased caregiver support  lives alone but has 3 children       Co-evaluation PT/OT/SLP Co-Evaluation/Treatment: Yes Reason for Co-Treatment: Complexity of the  patient's impairments (multi-system involvement);Necessary to address cognition/behavior during functional activity;For patient/therapist safety;To address functional/ADL transfers PT goals addressed during session: Mobility/safety with mobility;Balance OT goals addressed during session: ADL's and  self-care;Proper use of Adaptive equipment and DME;Strengthening/ROM      AM-PAC OT "6 Clicks" Daily Activity     Outcome Measure Help from another person eating meals?: A Lot Help from another person taking care of personal grooming?: A Lot Help from another person toileting, which includes using toliet, bedpan, or urinal?: A Lot Help from another person bathing (including washing, rinsing, drying)?: A Lot Help from another person to put on and taking off regular upper body clothing?: A Lot Help from another person to put on and taking off regular lower body clothing?: A Lot 6 Click Score: 12   End of Session Equipment Utilized During Treatment: Gait belt Nurse Communication: Mobility status;Precautions  Activity Tolerance: Patient tolerated treatment well Patient left: in bed;with call bell/phone within reach;with bed alarm set;with family/visitor present;with nursing/sitter in room  OT Visit Diagnosis: Unsteadiness on feet (R26.81);Muscle weakness (generalized) (M62.81);Hemiplegia and hemiparesis Hemiplegia - Right/Left: Right Hemiplegia - dominant/non-dominant: Dominant Hemiplegia - caused by: Cerebral infarction                Time: 1151-1222 OT Time Calculation (min): 31 min Charges:  OT General Charges $OT Visit: 1 Visit OT Evaluation $OT Eval Moderate Complexity: 1 Mod   Brynn, OTR/L  Acute Rehabilitation Services Pager: [phone] Office: [phone] .   Mateo Flow 05/09/2021, 12:50 PM

## 2021-05-09 NOTE — Progress Notes (Signed)
OT Cancellation Note  Patient Details Name: NAVY BELAY MRN: [id number] DOB: 04/21/1966   Cancelled Treatment:    Reason Eval/Treat Not Completed: Medical issues which prohibited therapy (extubating now/ BP 202/115)  Wynona Neat, OTR/L  Acute Rehabilitation Services Pager: [phone] Office: [phone] .  05/09/2021, 9:06 AM

## 2021-05-09 NOTE — Progress Notes (Signed)
SLP Cancellation Note  Patient Details Name: Peter Lucas MRN: [id number] DOB: [birth year]   Cancelled treatment:        Extubated then re-intubated 6/5. Will follow.   Royce Macadamia 05/09/2021, 8:21 AM   Breck Coons Lonell Face.Ed Nurse, children's [phone] Office [phone]

## 2021-05-09 NOTE — Evaluation (Signed)
Clinical/Bedside Swallow Evaluation Patient Details  Name: Peter Lucas MRN: [id number] Date of Birth: 02/16/1966  Today's Date: 05/09/2021 Time: SLP Start Time (ACUTE ONLY): 1700 SLP Stop Time (ACUTE ONLY): 1721 SLP Time Calculation (min) (ACUTE ONLY): 21.13 min  Past Medical History: History reviewed. No pertinent past medical history. Past Surgical History:  Past Surgical History:  Procedure Laterality Date  . BACK SURGERY     cyst removal  . RADIOLOGY WITH ANESTHESIA N/A 05/07/2021   Procedure: IR WITH ANESTHESIA;  Surgeon: Radiologist, Medication, MD;  Location: [facility] OR;  Service: Radiology;  Laterality: N/A;   HPI:  Pt is a 55 y/o male who presented on 6/4 with aphasia. 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 ETT 6/4-6/5; reintubated 6/5-6/6 worsening respiratory distress due to flash pulmonary edema. PMH: heavy tobacco use.   Assessment / Plan / Recommendation Clinical Impression  Pt was seen for bedside swallow evaluation with his sons present who denied the pt having a history of dysphagia. Oral mechanism exam was limited due to pt's difficulty following commands. Oral mucosa was moist and dentition was adequate. Mastication and oral clearance were adequate with solids. He demonstrated throat clearing and coughing with thin liquids which was lessened, but not eliminated with use of individual sips. Coughing was also noted once when pt attempted to consume 4oz of nectar thick liquids via straw using consecutive swallows. Pt demonstrated impulsive tendencies and his sons reported that he typically eats at a rapid rate. The impact of this on his performance is considered. A dysphagia 3 diet with nectar thick liquids will be initiated at this time with full supervision and observance of swallowing precautions. SLP will follow to assess diet tolerance, for  diet advancement as clinically indicated, and for instrumental assessment if symptoms persist. SLP Visit Diagnosis: Dysphagia, unspecified (R13.10)    Aspiration Risk  Mild aspiration risk    Diet Recommendation Dysphagia 3 (Mech soft);Nectar-thick liquid   Liquid Administration via: Cup;No straw Medication Administration: Whole meds with puree Supervision: Staff to assist with self feeding;Full supervision/cueing for compensatory strategies Compensations: Slow rate;Small sips/bites;Minimize environmental distractions Postural Changes: Seated upright at 90 degrees    Other  Recommendations Oral Care Recommendations: Oral care BID Other Recommendations: Order thickener from pharmacy   Follow up Recommendations Inpatient Rehab      Frequency and Duration min 2x/week  2 weeks       Prognosis Prognosis for Safe Diet Advancement: Good Barriers to Reach Goals: Language deficits      Swallow Study   General Date of Onset: 05/08/21 HPI: Pt is a 55 y/o male who presented on 6/4 with aphasia. 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 ETT 6/4-6/5; reintubated 6/5-6/6 worsening respiratory distress due to flash pulmonary edema. PMH: heavy tobacco use. Type of Study: Bedside Swallow Evaluation Previous Swallow Assessment: none Diet Prior to this Study: NPO Temperature Spikes Noted: No Respiratory Status: Room air History of Recent Intubation: Yes Length of Intubations (days): 2 days Date extubated: 05/09/21 Behavior/Cognition: Alert;Cooperative;Pleasant mood;Doesn't follow directions Oral Cavity Assessment: Within Functional Limits Oral Care Completed by SLP: No Oral Cavity - Dentition: Adequate natural dentition Vision: Functional for self-feeding Self-Feeding Abilities: Needs assist Patient Positioning: Upright in bed;Postural control adequate  for testing Baseline Vocal Quality:  (limited verbal output due to aphasia)  Volitional Cough: Cognitively unable to elicit Volitional Swallow: Unable to elicit    Oral/Motor/Sensory Function Overall Oral Motor/Sensory Function:  (UTA)   Ice Chips Ice chips: Within functional limits Presentation: Spoon   Thin Liquid Thin Liquid: Impaired Presentation: Cup Oral Phase Impairments: Reduced labial seal Oral Phase Functional Implications: Right anterior spillage Pharyngeal  Phase Impairments: Cough - Immediate;Throat Clearing - Immediate    Nectar Thick Nectar Thick Liquid: Impaired Presentation: Straw Pharyngeal Phase Impairments: Cough - Immediate   Honey Thick Honey Thick Liquid: Not tested   Puree Puree: Within functional limits Presentation: Spoon   Solid     Solid: Within functional limits Presentation: Self Fed     Peter Lucas I. Vear Clock, MS, [name] Acute Rehabilitation Services Office number [phone] Pager [phone]  Peter Lucas 05/09/2021,5:34 PM

## 2021-05-09 NOTE — Progress Notes (Addendum)
Referring Physician(s):  Code stroke- Erick Blinks (neurology)  Supervising Physician: Julieanne Cotton  Patient Status:  [facility] - In-pt  Chief Complaint:  F/U Stroke  Brief History:  Peter Lucas had an acute CVA.  He underwent cerebral arteriogram with emergent mechanical thrombectomy of left MCA M2/M3 junction occlusion achieving a TICI 2c revascularization via right femoral approach 05/07/2021 by Dr. [name].  Subjective:  Remains intubated. Raises head and moves left arm.  Allergies: Patient has no known allergies.  Medications: Prior to Admission medications   Not on File     Vital Signs: BP (!) 151/90   Pulse 82   Temp 98.8 F (37.1 C) (Oral)   Resp 20   Ht 6\' 3"  (1.905 m)   Wt 124.4 kg Comment: bed weight  SpO2 90%   BMI 34.28 kg/m   Physical Exam Constitutional:      Appearance: Normal appearance.  HENT:     Head: Normocephalic and atraumatic.  Cardiovascular:     Rate and Rhythm: Normal rate.  Pulmonary:     Comments: Intubated/vent Skin:    General: Skin is warm and dry.  Neurological:     Mental Status: He is alert.     Comments: Opens eyes, moves all fours per RN and wife.     Imaging: CT HEAD WO CONTRAST  Result Date: 05/08/2021 CLINICAL DATA:  Follow-up left MCA branch vessel occlusion EXAM: CT HEAD WITHOUT CONTRAST TECHNIQUE: Contiguous axial images were obtained from the base of the skull through the vertex without intravenous contrast. COMPARISON:  Multiple examinations yesterday. FINDINGS: Brain: More pronounced low density present in the left posterior temporal lobe, temporoparietal junction and parietal low consistent with the known acute infarction. Maximal dimension of this region is about 8.5 cm. Swelling but no significant mass effect or midline shift. Resolution of adjacent hyperdensity which was probably contrast staining from the previous intervention. No sign of hemorrhage. No new infarction. Mild chronic small-vessel  ischemic change elsewhere affecting the hemispheric white matter Vascular: There is atherosclerotic calcification of the major vessels at the base of the brain. Skull: Negative Sinuses/Orbits: Scattered sinus opacification, most pronounced within the left maxillary sinus. Orbits negative. Other: None IMPRESSION: More pronounced low-density within the left MCA branch vessel infarction as described above. No midline shift or hemorrhage. Diminishing density of the contrast staining adjacent to the infarction. Electronically Signed   By: 07/08/2021 M.D.   On: 05/08/2021 13:45   CT HEAD WO CONTRAST  Result Date: 05/07/2021 CLINICAL DATA:  Left MCA peripheral occlusion status post percutaneous thrombectomy. EXAM: CT HEAD WITHOUT CONTRAST TECHNIQUE: Contiguous axial images were obtained from the base of the skull through the vertex without intravenous contrast. COMPARISON:  Intraoperative cone beam CT 05/07/2021 at 2:42 p.m., CT head 11:15 a.m. FINDINGS: Brain: There is again identified high attenuation material intravascularly within the distal left MCA branches in the region of the sylvian fissure posteriorly. There is petechial hemorrhage involving the temporal operculum in keeping with a a small cortical infarct in this region, best appreciated on axial image # 17/6. Trace subarachnoid hemorrhage is noted within the sylvian fissure at this level as well as within the sulci of the left para falcine parietal lobe. This appears stable when compared to prior cone beam CT arteriogram. There is no significant associated mass effect. No midline shift. No foci of acute intracranial hemorrhage. Ventricular size is normal. Cerebellum is unremarkable. Vascular: No new asymmetric hyperdensity within the a intravascular spaces at the skull base.  Skull: Intact Sinuses/Orbits: There is opacification of the left maxillary sinus, unchanged as well as mild mucosal thickening within the ethmoid air cells bilaterally. The orbits are  unremarkable. Other: Mastoid air cells and middle ear cavities are clear. IMPRESSION: Stable hyperdensity within the terminal left MCA branches in the region of the posterior left sylvian fissure in keeping with intraluminal thrombus and/or contrast as well as petechial hemorrhage involving a small adjacent cortical infarct involving the left temporal operculum. Stable small subarachnoid hemorrhage in the region of the a cortical infarct as well as the left parietal lobe. No abnormal mass effect. Electronically Signed   By: [name]   On: 05/07/2021 20:44   CT CEREBRAL PERFUSION W CONTRAST  Result Date: 05/07/2021 CLINICAL DATA:  55 year old male code stroke presentation with posterior left MCA infarct visible on plain CT. EXAM: CT ANGIOGRAPHY HEAD AND NECK CT PERFUSION BRAIN TECHNIQUE: Multidetector CT imaging of the head and neck was performed using the standard protocol during bolus administration of intravenous contrast. Multiplanar CT image reconstructions and MIPs were obtained to evaluate the vascular anatomy. Carotid stenosis measurements (when applicable) are obtained utilizing NASCET criteria, using the distal internal carotid diameter as the denominator. Multiphase CT imaging of the brain was performed following IV bolus contrast injection. Subsequent parametric perfusion maps were calculated using RAPID software. CONTRAST:  OMNIPAQUE IOHEXOL 350 MG/ML SOLN COMPARISON:  Plain head CT 1116 hours. FINDINGS: CT Brain Perfusion Findings: ASPECTS: 7 CBF (<30%) Volume: 35mL Perfusion (Tmax>6.0s) volume: 57mL.  Hypoperfusion index 0.5 Mismatch Volume: 90mL, mismatch ratio 1.7 Infarction Location:Posterior left MCA CTA NECK Skeleton: Carious posterior dentition. Minimal cervical spine degeneration. No acute osseous abnormality identified. Upper chest: Negative. Other neck: Postinflammatory dystrophic calcifications of the palatine tonsils. Left maxillary sinus disease. Otherwise negative. Aortic  arch: Mildly tortuous aortic arch. Three vessel arch configuration without arch atherosclerosis. Right carotid system: Mildly tortuous brachiocephalic artery. Negative right CCA and right carotid bifurcation. Tortuous right ICA distal to the bulb but no stenosis to the skull base. Left carotid system: Negative left CCA. Minimal calcified plaque at the left carotid bifurcation. Tortuous left ICA below the skull base without stenosis. Vertebral arteries: Normal proximal right subclavian artery and right vertebral artery origin. The right vertebral is patent and normal to the skull base. Normal proximal left subclavian artery and left vertebral artery origin. The left vertebral is patent and normal to the skull base. CTA HEAD Posterior circulation: Codominant vertebral arteries. Patent V4 segments with no stenosis on the left, but there is a severe stenosis at the right vertebrobasilar junction best seen on series 9, image 29. Normal PICA origins. Patent basilar artery with mild irregularity but no significant stenosis. Patent SCA and PCA origins. Posterior communicating arteries are diminutive or absent. Bilateral PCA irregularity in keeping with intracranial atherosclerosis. Moderate to severe stenosis of the right P1/P2 junction, and mild left PCA stenoses on series 8, image 17. Anterior circulation: Both ICA siphons are patent. On the left there is mild calcified plaque with no significant siphon stenosis. Normal left ophthalmic artery origin. Patent left ICA terminus. Mild right siphon calcified plaque without stenosis. Normal right ophthalmic artery origin and right ICA terminus. Patent MCA and ACA origins. Mild stenosis at the right A1 origin on series 9, image 24. Anterior communicating artery and ACA A2 segments are within normal limits. There is a moderate to severe distal left A2/pericallosal stenosis on series 10, image 23. Right MCA M1 and right MCA trifurcation are patent without stenosis.  Right MCA  branches are within normal limits. Left MCA M1 and left MCA trifurcation are patent without stenosis. There is occlusion of a posterior right MCA branch, distal M2 or proximal M3 seen on series 7, images 155-157. This is also on series 10, image 31. The other left MCA branches appear patent with mild irregularity. Venous sinuses: Early contrast timing, not well evaluated. Anatomic variants: None. Review of the MIP images confirms the above findings Left MCA findings reviewed Erick BlinksSALMAN KHALIQDINA on 05/07/2021 at 1135 hours, and discussed again by telephone at 11:45 . IMPRESSION: 1. Positive for occlusion of a posterior Left MCA branch, distal M2 versus proximal M3. Associated posterior left MCA territory infarct core of 42 mL and 70 mL estimated penumbra by CTP (mismatch of 28 mL). This was discussed with Dr. Erick Blinks on 05/07/2021 beginning at 1135 hours. 2. 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 - moderate to severe stenoses of the Right PCA P1/P2 junction. 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 Electronically Signed   By: Odessa Fleming M.D.   On: 05/07/2021 11:57   DG CHEST PORT 1 VIEW  Addendum Date: 05/09/2021   ADDENDUM REPORT: 05/09/2021 08:05 ADDENDUM: Sentence in the impression should read: Endotracheal tube as described without pneumothorax. Electronically Signed   By: Bretta Bang III M.D.   On: 05/09/2021 08:05   Result Date: 05/09/2021 CLINICAL DATA:  Hypoxia EXAM: PORTABLE CHEST 1 VIEW COMPARISON:  May 07, 2021 FINDINGS: Endotracheal tube tip is 3.9 cm above the carina. No pneumothorax. There is cardiomegaly with pulmonary venous hypertension. There is ill-defined airspace opacity in each lower lung region. No adenopathy. No bone lesions. IMPRESSION: Endotracheal tube as described pneumothorax. Cardiomegaly with pulmonary vascular  congestion. Airspace opacity in each lower lung region may represent bilateral pneumonia. A degree of pulmonary edema may present in this manner. Both edema and pneumonia may be present concurrently. Electronically Signed: By: Bretta Bang III M.D. On: 05/08/2021 14:01   DG CHEST PORT 1 VIEW  Result Date: 05/09/2021 CLINICAL DATA:  Hypoxia EXAM: PORTABLE CHEST 1 VIEW COMPARISON:  May 08, 2021 FINDINGS: Endotracheal tube tip is 5.5 cm above the carina. No pneumothorax. There is persistent cardiomegaly with pulmonary venous hypertension. There is ill-defined opacity in each lower lung region, stable. No new opacity. No adenopathy. No bone lesions. IMPRESSION: Endotracheal tube as described without pneumothorax. Ill-defined opacity in each lung base may represent bilateral pneumonia is stable. There may be pulmonary edema as well. Appearance stable. Stable cardiomegaly with a degree of cardiomegaly with pulmonary vascular congestion. Electronically Signed   By: Bretta Bang III M.D.   On: 05/09/2021 08:04   Portable Chest x-ray  Result Date: 05/07/2021 CLINICAL DATA:  Code stroke.  Endotracheal tube. EXAM: PORTABLE CHEST 1 VIEW COMPARISON:  None. FINDINGS: Endotracheal tube appears well positioned with tip approximately 3 cm above the carina. Cardiomegaly. Lungs are clear. No pleural effusion or pneumothorax is seen. Osseous structures about the chest are unremarkable. IMPRESSION: 1. Endotracheal tube well positioned with tip approximately 3 cm above the carina. 2. Cardiomegaly. 3. Lungs are clear. Electronically Signed   By: Bary Richard M.D.   On: 05/07/2021 15:57   ECHOCARDIOGRAM COMPLETE BUBBLE STUDY  Result Date: 05/08/2021    ECHOCARDIOGRAM REPORT   Patient Name:   Peter Lucas Date of Exam: 05/08/2021 Medical Rec #:  [id number]  Height:       75.0 in Accession #:    [id number]     Weight:       274.3 lb Date of Birth:  Oct 24, 1966      BSA:          2.510 m Patient Age:    55 years        BP:           104/61 mmHg Patient Gender: M              HR:           65 bpm. Exam Location:  Inpatient Procedure: 2D Echo and Saline Contrast Bubble Study Indications:    stroke  History:        Patient has no prior history of Echocardiogram examinations.  Sonographer:    Delcie Roch Referring Phys: [id number] [facility] KHALIQDINA IMPRESSIONS  1. Left ventricular ejection fraction, by estimation, is 55 to 60%. The left ventricle has normal function. The left ventricle has no regional wall motion abnormalities. There is moderate left ventricular hypertrophy. Left ventricular diastolic parameters are indeterminate.  2. Right ventricular systolic function is normal. The right ventricular size is normal. Tricuspid regurgitation signal is inadequate for assessing PA pressure.  3. The mitral valve is grossly normal. Trivial mitral valve regurgitation.  4. The aortic valve is tricuspid. Aortic valve regurgitation is not visualized.  5. The inferior vena cava is normal in size with greater than 50% respiratory variability, suggesting right atrial pressure of 3 mmHg.  6. No visualization of agitated saline bubbles in right heart with reported injection from IV access both upper extremities, possibly inadequate agitation prior to injection. Consider repeat study if clinically indicated. FINDINGS  Left Ventricle: Left ventricular ejection fraction, by estimation, is 55 to 60%. The left ventricle has normal function. The left ventricle has no regional wall motion abnormalities. The left ventricular internal cavity size was normal in size. There is  moderate left ventricular hypertrophy. Left ventricular diastolic parameters are indeterminate. Right Ventricle: The right ventricular size is normal. No increase in right ventricular wall thickness. Right ventricular systolic function is normal. Tricuspid regurgitation signal is inadequate for assessing PA pressure. Left Atrium: Left atrial size was normal in size. Right Atrium:  Right atrial size was normal in size. Pericardium: There is no evidence of pericardial effusion. Mitral Valve: The mitral valve is grossly normal. Trivial mitral valve regurgitation. Tricuspid Valve: The tricuspid valve is grossly normal. Tricuspid valve regurgitation is trivial. Aortic Valve: The aortic valve is tricuspid. There is mild aortic valve annular calcification. Aortic valve regurgitation is not visualized. Pulmonic Valve: The pulmonic valve was grossly normal. Pulmonic valve regurgitation is trivial. Aorta: The aortic root is normal in size and structure. Venous: The inferior vena cava is normal in size with greater than 50% respiratory variability, suggesting right atrial pressure of 3 mmHg. IAS/Shunts: No atrial level shunt detected by color flow Doppler. Agitated saline contrast was given intravenously to evaluate for intracardiac shunting.  LEFT VENTRICLE PLAX 2D LVIDd:         5.70 cm  Diastology LVIDs:         3.50 cm  LV e' medial:    4.90 cm/s LV PW:         1.20 cm  LV E/e' medial:  12.1 LV IVS:        1.40 cm  LV e' lateral:   5.77 cm/s LVOT diam:     2.20 cm  LV  E/e' lateral: 10.2 LV SV:         73 LV SV Index:   29 LVOT Area:     3.80 cm  RIGHT VENTRICLE             IVC RV S prime:     16.80 cm/s  IVC diam: 1.80 cm TAPSE (M-mode): 1.8 cm LEFT ATRIUM             Index       RIGHT ATRIUM           Index LA diam:        4.50 cm 1.79 cm/m  RA Area:     20.60 cm LA Vol (A2C):   68.7 ml 27.38 ml/m RA Volume:   59.60 ml  23.75 ml/m LA Vol (A4C):   48.7 ml 19.41 ml/m LA Biplane Vol: 59.9 ml 23.87 ml/m  AORTIC VALVE LVOT Vmax:   111.00 cm/s LVOT Vmean:  66.000 cm/s LVOT VTI:    0.192 m  AORTA Ao Root diam: 3.70 cm Ao Asc diam:  3.60 cm MITRAL VALVE MV Area (PHT): 2.95 cm    SHUNTS MV Decel Time: 257 msec    Systemic VTI:  0.19 m MV E velocity: 59.10 cm/s  Systemic Diam: 2.20 cm MV A velocity: 76.70 cm/s MV E/A ratio:  0.77 Nona Dell MD Electronically signed by Nona Dell MD  Signature Date/Time: 05/08/2021/5:08:48 PM    Final    CT HEAD CODE STROKE WO CONTRAST  Result Date: 05/07/2021 CLINICAL DATA:  Code stroke.  55 year old male neurologic deficit. EXAM: CT HEAD WITHOUT CONTRAST TECHNIQUE: Contiguous axial images were obtained from the base of the skull through the vertex without intravenous contrast. COMPARISON:  None. FINDINGS: Brain: Confluent cytotoxic edema in the posterior left MCA territory affecting the posterosuperior temporal gyrus through the left parietal lobe. No hemorrhagic transformation. No significant mass effect. The insula, internal capsule and deep gray matter nuclei appear spared. Gray-white matter differentiation elsewhere within normal limits. No ventriculomegaly. Normal basilar cisterns. Vascular: Calcified atherosclerosis at the skull base. Suspicious hyperdensity in a posterior left MCA branch along the anterior margin of the cytotoxic edema series 2, image 17 and series 6, image 45. But no more proximal suspicious vessel hyperdensity is identified. Skull: Negative. Sinuses/Orbits: Subtotal opacification of the left maxillary sinus due to a mucosal thickening and/or retention cyst. Other Visualized paranasal sinuses and mastoids are clear. Other: Visualized orbits and scalp soft tissues are within normal limits. ASPECTS [facility] Stroke Program Early CT Score) - Ganglionic level infarction (caudate, lentiform nuclei, internal capsule, insula, M1-M3 cortex): 6 (abnormal M3) - Supraganglionic infarction (M4-M6 cortex): 1 Total score (0-10 with 10 being normal): 7 IMPRESSION: 1. 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 ASPECTS 7. 2. No hemorrhagic transformation or mass effect. Suspicion of a posterior left MCA branch occlusion at the posterior sylvian fissure. Study discussed by telephone with Dr. Erick Blinks on 05/07/2021 at 11:20 . Electronically Signed   By: Odessa Fleming M.D.   On: 05/07/2021 11:23   CT ANGIO HEAD CODE  STROKE  Result Date: 05/07/2021 CLINICAL DATA:  55 year old male code stroke presentation with posterior left MCA infarct visible on plain CT. EXAM: CT ANGIOGRAPHY HEAD AND NECK CT PERFUSION BRAIN TECHNIQUE: Multidetector CT imaging of the head and neck was performed using the standard protocol during bolus administration of intravenous contrast. Multiplanar CT image reconstructions and MIPs were obtained to evaluate the vascular anatomy. Carotid stenosis measurements (when applicable) are  obtained utilizing NASCET criteria, using the distal internal carotid diameter as the denominator. Multiphase CT imaging of the brain was performed following IV bolus contrast injection. Subsequent parametric perfusion maps were calculated using RAPID software. CONTRAST:  OMNIPAQUE IOHEXOL 350 MG/ML SOLN COMPARISON:  Plain head CT 1116 hours. FINDINGS: CT Brain Perfusion Findings: ASPECTS: 7 CBF (<30%) Volume: 42mL Perfusion (Tmax>6.0s) volume: 70mL.  Hypoperfusion index 0.5 Mismatch Volume: 28mL, mismatch ratio 1.7 Infarction Location:Posterior left MCA CTA NECK Skeleton: Carious posterior dentition. Minimal cervical spine degeneration. No acute osseous abnormality identified. Upper chest: Negative. Other neck: Postinflammatory dystrophic calcifications of the palatine tonsils. Left maxillary sinus disease. Otherwise negative. Aortic arch: Mildly tortuous aortic arch. Three vessel arch configuration without arch atherosclerosis. Right carotid system: Mildly tortuous brachiocephalic artery. Negative right CCA and right carotid bifurcation. Tortuous right ICA distal to the bulb but no stenosis to the skull base. Left carotid system: Negative left CCA. Minimal calcified plaque at the left carotid bifurcation. Tortuous left ICA below the skull base without stenosis. Vertebral arteries: Normal proximal right subclavian artery and right vertebral artery origin. The right vertebral is patent and normal to the skull base. Normal  proximal left subclavian artery and left vertebral artery origin. The left vertebral is patent and normal to the skull base. CTA HEAD Posterior circulation: Codominant vertebral arteries. Patent V4 segments with no stenosis on the left, but there is a severe stenosis at the right vertebrobasilar junction best seen on series 9, image 29. Normal PICA origins. Patent basilar artery with mild irregularity but no significant stenosis. Patent SCA and PCA origins. Posterior communicating arteries are diminutive or absent. Bilateral PCA irregularity in keeping with intracranial atherosclerosis. Moderate to severe stenosis of the right P1/P2 junction, and mild left PCA stenoses on series 8, image 17. Anterior circulation: Both ICA siphons are patent. On the left there is mild calcified plaque with no significant siphon stenosis. Normal left ophthalmic artery origin. Patent left ICA terminus. Mild right siphon calcified plaque without stenosis. Normal right ophthalmic artery origin and right ICA terminus. Patent MCA and ACA origins. Mild stenosis at the right A1 origin on series 9, image 24. Anterior communicating artery and ACA A2 segments are within normal limits. There is a moderate to severe distal left A2/pericallosal stenosis on series 10, image 23. Right MCA M1 and right MCA trifurcation are patent without stenosis. Right MCA branches are within normal limits. Left MCA M1 and left MCA trifurcation are patent without stenosis. There is occlusion of a posterior right MCA branch, distal M2 or proximal M3 seen on series 7, images 155-157. This is also on series 10, image 31. The other left MCA branches appear patent with mild irregularity. Venous sinuses: Early contrast timing, not well evaluated. Anatomic variants: None. Review of the MIP images confirms the above findings Left MCA findings reviewed in person Dr. Erick Blinks on 05/07/2021 at 1135 hours, and discussed again by telephone at 11:45 . IMPRESSION: 1.  Positive for occlusion of a posterior Left MCA branch, distal M2 versus proximal M3. Associated posterior left MCA territory infarct core of 42 mL and 70 mL estimated penumbra by CTP (mismatch of 28 mL). This was discussed with Dr. Erick Blinks on 05/07/2021 beginning at 1135 hours. 2. 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 - moderate to severe stenoses of the Right PCA P1/P2 junction. 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 Electronically Signed  By: Odessa Fleming M.D.   On: 05/07/2021 11:57   CT ANGIO NECK CODE STROKE  Result Date: 05/07/2021 CLINICAL DATA:  55 year old male code stroke presentation with posterior left MCA infarct visible on plain CT. EXAM: CT ANGIOGRAPHY HEAD AND NECK CT PERFUSION BRAIN TECHNIQUE: Multidetector CT imaging of the head and neck was performed using the standard protocol during bolus administration of intravenous contrast. Multiplanar CT image reconstructions and MIPs were obtained to evaluate the vascular anatomy. Carotid stenosis measurements (when applicable) are obtained utilizing NASCET criteria, using the distal internal carotid diameter as the denominator. Multiphase CT imaging of the brain was performed following IV bolus contrast injection. Subsequent parametric perfusion maps were calculated using RAPID software. CONTRAST:  OMNIPAQUE IOHEXOL 350 MG/ML SOLN COMPARISON:  Plain head CT 1116 hours. FINDINGS: CT Brain Perfusion Findings: ASPECTS: 7 CBF (<30%) Volume: 42mL Perfusion (Tmax>6.0s) volume: 70mL.  Hypoperfusion index 0.5 Mismatch Volume: 28mL, mismatch ratio 1.7 Infarction Location:Posterior left MCA CTA NECK Skeleton: Carious posterior dentition. Minimal cervical spine degeneration. No acute osseous abnormality identified. Upper chest: Negative. Other neck: Postinflammatory dystrophic calcifications of the  palatine tonsils. Left maxillary sinus disease. Otherwise negative. Aortic arch: Mildly tortuous aortic arch. Three vessel arch configuration without arch atherosclerosis. Right carotid system: Mildly tortuous brachiocephalic artery. Negative right CCA and right carotid bifurcation. Tortuous right ICA distal to the bulb but no stenosis to the skull base. Left carotid system: Negative left CCA. Minimal calcified plaque at the left carotid bifurcation. Tortuous left ICA below the skull base without stenosis. Vertebral arteries: Normal proximal right subclavian artery and right vertebral artery origin. The right vertebral is patent and normal to the skull base. Normal proximal left subclavian artery and left vertebral artery origin. The left vertebral is patent and normal to the skull base. CTA HEAD Posterior circulation: Codominant vertebral arteries. Patent V4 segments with no stenosis on the left, but there is a severe stenosis at the right vertebrobasilar junction best seen on series 9, image 29. Normal PICA origins. Patent basilar artery with mild irregularity but no significant stenosis. Patent SCA and PCA origins. Posterior communicating arteries are diminutive or absent. Bilateral PCA irregularity in keeping with intracranial atherosclerosis. Moderate to severe stenosis of the right P1/P2 junction, and mild left PCA stenoses on series 8, image 17. Anterior circulation: Both ICA siphons are patent. On the left there is mild calcified plaque with no significant siphon stenosis. Normal left ophthalmic artery origin. Patent left ICA terminus. Mild right siphon calcified plaque without stenosis. Normal right ophthalmic artery origin and right ICA terminus. Patent MCA and ACA origins. Mild stenosis at the right A1 origin on series 9, image 24. Anterior communicating artery and ACA A2 segments are within normal limits. There is a moderate to severe distal left A2/pericallosal stenosis on series 10, image 23. Right MCA  M1 and right MCA trifurcation are patent without stenosis. Right MCA branches are within normal limits. Left MCA M1 and left MCA trifurcation are patent without stenosis. There is occlusion of a posterior right MCA branch, distal M2 or proximal M3 seen on series 7, images 155-157. This is also on series 10, image 31. The other left MCA branches appear patent with mild irregularity. Venous sinuses: Early contrast timing, not well evaluated. Anatomic variants: None. Review of the MIP images confirms the above findings Left MCA findings reviewed in person Dr. Erick Blinks on 05/07/2021 at 1135 hours, and discussed again by telephone at 11:45 . IMPRESSION: 1. Positive for occlusion of a posterior  Left MCA branch, distal M2 versus proximal M3. Associated posterior left MCA territory infarct core of 42 mL and 70 mL estimated penumbra by CTP (mismatch of 28 mL). This was discussed with Dr. Erick BlinksSALMAN KHALIQDINA on 05/07/2021 beginning at 1135 hours. 2. 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 - moderate to severe stenoses of the Right PCA P1/P2 junction. 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 Electronically Signed   By: Odessa FlemingH  Hall M.D.   On: 05/07/2021 11:57    Labs:  CBC: Recent Labs    05/07/21 1141 05/07/21 1244 05/07/21 1620 05/08/21 0412 05/08/21 1715 05/09/21 0824  WBC 11.1* 10.5  --  9.1  --  9.4  HGB 16.9 14.8 16.0 13.1 13.9 13.7  HCT 50.7 44.6 47.0 39.4 41.0 43.2  PLT 270 276  --  283  --  188    COAGS: Recent Labs    05/07/21 1141  INR 1.1  APTT 34    BMP: Recent Labs    05/07/21 1141 05/07/21 1620 05/08/21 0412 05/08/21 1657 05/08/21 1715 05/09/21 0824  NA 136   < > 138 137 141 137  K 3.7   < > 3.3* 4.5 4.2 3.6  CL 103  --  112* 109  --  105  CO2 22  --  20* 19*  --  22  GLUCOSE 185*  --  115* 126*  --  146*  BUN 16  --  20 22*   --  25*  CALCIUM 9.0  --  7.5* 7.7*  --  7.9*  CREATININE 1.21  --  1.79* 2.19*  --  2.01*  GFRNONAA >60  --  44* 34*  --  38*   < > = values in this interval not displayed.    LIVER FUNCTION TESTS: Recent Labs    05/07/21 1141  BILITOT 0.6  AST 15  ALT 21  ALKPHOS 95  PROT 7.5  ALBUMIN 3.6    Assessment and Plan:  Acute CVA  Arteriogram with emergent mechanical thrombectomy of left MCA M2/M3 junction occlusion achieving a TICI 2c revascularization via right femoral approach 05/07/2021 by Dr. Corliss Skainseveshwar.  Continue care per neurology.  NIR to follow  Electronically Signed: Gwynneth MacleodWENDY S Samanta Gal, PA-C 05/09/2021, 3:07 PM    I spent a total of 15 Minutes at the the patient's bedside AND on the patient's hospital floor or unit, greater than 50% of which was counseling/coordinating care for f/u stroke.

## 2021-05-10 DIAGNOSIS — J189 Pneumonia, unspecified organism: Secondary | ICD-10-CM

## 2021-05-10 LAB — GLUCOSE, CAPILLARY
Glucose-Capillary: 164 mg/dL — ABNORMAL HIGH (ref 70–99)
Glucose-Capillary: 184 mg/dL — ABNORMAL HIGH (ref 70–99)
Glucose-Capillary: 174 mg/dL — ABNORMAL HIGH (ref 70–99)
Glucose-Capillary: 180 mg/dL — ABNORMAL HIGH (ref 70–99)
Glucose-Capillary: 200 mg/dL — ABNORMAL HIGH (ref 70–99)
Glucose-Capillary: 188 mg/dL — ABNORMAL HIGH (ref 70–99)

## 2021-05-10 LAB — TRIGLYCERIDES: Triglycerides: 200 mg/dL — ABNORMAL HIGH (ref <150)

## 2021-05-10 MED ORDER — CLONIDINE HCL 0.1 MG PO TABS
0.2000 mg | ORAL_TABLET | Freq: Three times a day (TID) | ORAL | Status: DC
  Administered 2021-05-10 – 2021-05-12 (×6): 0.2 mg via ORAL
  Filled 2021-05-10: qty 1
  Filled 2021-05-10 (×2): qty 2
  Filled 2021-05-10 (×3): qty 1

## 2021-05-10 MED ORDER — SENNOSIDES-DOCUSATE SODIUM 8.6-50 MG PO TABS: 1.0000 | ORAL_TABLET | Freq: Every evening | ORAL | Status: DC | PRN

## 2021-05-10 MED ORDER — AMLODIPINE BESYLATE 5 MG PO TABS
5.0000 mg | ORAL_TABLET | Freq: Every day | ORAL | Status: DC
  Administered 2021-05-10: 5 mg via ORAL
  Filled 2021-05-10: qty 1

## 2021-05-10 MED ORDER — HYDROCHLOROTHIAZIDE 25 MG PO TABS
25.0000 mg | ORAL_TABLET | Freq: Every day | ORAL | Status: DC
  Administered 2021-05-10 – 2021-05-12 (×3): 25 mg via ORAL
  Filled 2021-05-10 (×3): qty 1

## 2021-05-10 MED ORDER — SODIUM CHLORIDE 0.9 % IV SOLN
2.0000 g | INTRAVENOUS | Status: DC
  Administered 2021-05-10: 2 g via INTRAVENOUS
  Filled 2021-05-10 (×2): qty 20
  Filled 2021-05-10: qty 2

## 2021-05-10 MED ORDER — ASPIRIN EC 81 MG PO TBEC
81.0000 mg | DELAYED_RELEASE_TABLET | Freq: Every day | ORAL | Status: DC
  Administered 2021-05-10 – 2021-05-12 (×3): 81 mg via ORAL
  Filled 2021-05-10 (×3): qty 1

## 2021-05-10 MED ORDER — CLOPIDOGREL BISULFATE 75 MG PO TABS
75.0000 mg | ORAL_TABLET | Freq: Every day | ORAL | Status: DC
  Administered 2021-05-10 – 2021-05-12 (×3): 75 mg via ORAL
  Filled 2021-05-10 (×3): qty 1

## 2021-05-10 MED ORDER — PANTOPRAZOLE SODIUM 40 MG PO TBEC
40.0000 mg | DELAYED_RELEASE_TABLET | Freq: Every day | ORAL | Status: DC
  Administered 2021-05-10: 40 mg via ORAL
  Filled 2021-05-10: qty 1

## 2021-05-10 MED ORDER — ATORVASTATIN CALCIUM 40 MG PO TABS
40.0000 mg | ORAL_TABLET | Freq: Every day | ORAL | Status: DC
  Administered 2021-05-10 – 2021-05-12 (×3): 40 mg via ORAL
  Filled 2021-05-10 (×3): qty 1

## 2021-05-10 NOTE — Progress Notes (Signed)
INTERVAL HISTORY His daughter is at the bedside.    He remains globally aphasic and not following commands, but is alert and protecting his airway and can speak only occasional single words.Peter Lucas   MRI shows large confluent acute posterior left MCA infarct with smaller acute infarcts throughout the left frontotemporal and left insular region with mild associated edema.  Vital signs are stable.  Neurological exam unchanged.  Patient has been seen by speech therapy and cleared for dysphagia diet Vitals:   05/10/21 1600 05/10/21 1615 05/10/21 1630 05/10/21 1645  BP: (!) 153/97 (!) 147/90 (!) 155/93 (!) 167/104  Pulse: 78 80 80 79  Resp: 20 (!) 23 (!) 21 (!) 30  Temp:      TempSrc:      SpO2: 91% 91% 92% 92%  Weight:      Height:       CBC:  Recent Labs  Lab 05/07/21 1141 05/07/21 1244 05/08/21 0412 05/08/21 1715 05/09/21 0824  WBC 11.1*   < > 9.1  --  9.4  NEUTROABS 8.9*  --  6.3  --   --   HGB 16.9   < > 13.1 13.9 13.7  HCT 50.7   < > 39.4 41.0 43.2  MCV 84.2   < > 85.3  --  88.2  PLT 270   < > 283  --  188   < > = values in this interval not displayed.   Basic Metabolic Panel:  Recent Labs  Lab 05/08/21 0912 05/08/21 1657 05/08/21 1715 05/09/21 0824  NA  --  137 141 137  K  --  4.5 4.2 3.6  CL  --  109  --  105  CO2  --  19*  --  22  GLUCOSE  --  126*  --  146*  BUN  --  22*  --  25*  CREATININE  --  2.19*  --  2.01*  CALCIUM  --  7.7*  --  7.9*  MG 1.9  --   --   --    Lipid Panel:  Recent Labs  Lab 05/08/21 0412 05/09/21 0824 05/10/21 1151  CHOL 135  --   --   TRIG 144   < > 200*  HDL 20*  --   --   CHOLHDL 6.8  --   --   VLDL 29  --   --   LDLCALC 86  --   --    < > = values in this interval not displayed.   HgbA1c:  Recent Labs  Lab 05/08/21 0412  HGBA1C 10.1*   Urine Drug Screen:  Recent Labs  Lab 05/07/21 1704  LABOPIA NONE DETECTED  COCAINSCRNUR NONE DETECTED  LABBENZ NONE DETECTED  AMPHETMU NONE DETECTED  THCU NONE DETECTED  LABBARB NONE  DETECTED    Alcohol Level  Recent Labs  Lab 05/07/21 1141  ETH <10    IMAGING past 24 hours No results found.  PHYSICAL EXAM GENERAL: Obese middle-aged Caucasian male mild-mod distress HEENT: Head is atraumatic, normocephalic. Eyes: PERRL.  LUNGS: deep breathing sounds, just extubated CARDIOVASCULAR: Regular rate and rhythm  ABDOMEN: Bowel sounds present.  Ext : normal pulse distally  NEUROLOGICAL EXAM MS: No sedation, extubated. He is alert, but poorly attends. No attempt to speak. Not following commands.  CN 2-12: PERRL, Blinks to visual threatening BL, EOM seem intact, but difficult to test d/t not following commands. Mild R facial asymmetry, no facial sensory deficits Global aphasia.  Left gaze deviation.  Does not blink to threat on the right. Motor: Moving all ext spontanously, unable to assess full stregth. Tone normal. No Abnormal movements identified.  Sensory:  No focal sensory loss noted in limited exam Coordination: Unable to assess Gait was not tested considering pt's condition  ASSESSMENT/PLAN 1. Acute/subacute ischemic stroke within the L Parietotemporal area in the setting of distal LM2 occlusion. ASPECT 7. S/P IR thrombectomy initial with TICI2c with reoccluded vessel.   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 However, cardioemblic event cannot be completely ruled out. Of note, family reported what seems to be an episode of Amaurosis fugax last month they think it was the R eye.     Vascular risk factors: Smoking, uncontrolled DM2, hga1c 10.,  doesn't follow with PCP.   CT head  Posterior left MCA infarct with fairly well-developed cytotoxic edema. ASPECTS 7. CTA head & neck occlusion of a posterior Left MCA branch, distal M2 versus proximal M3. 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 Moderately severe stenosis of R P1/P2 and L pericallosal branch.  CT perfusion posterior left MCA territory infarct core of 42 mL and 70 mL estimated penumbra  by CTP (mismatch of 28 mL)  Repeated CT head with petechial hemorrhage involving a small adjacent cortical infarct involving the left temporal operculum. Stable small subarachnoid hemorrhage in the region of the a cortical infarct as well as the left parietal lobe.  Repeated CT head this afternoon showed evolving L parietotemporal infarct with less pronounced hyperdensity within the sylvian fissure most likely contrast staining.    MRI Pending this afternoon  2D Echo Pending   LDL 86  HgbA1c 10.1  Lovenox for VTE prophylaxis Diet Order            Diet heart healthy/carb modified Room service appropriate? Yes with Assist; Fluid consistency: Nectar Thick  Diet effective now                 No antithrombotics prior to admission, Will switch patient to oral aspirin   therapy recommendations: CLR   disposition:  Pending  Hypertension  Home meds: None  . Mainatin SBP <160 for now.  . Was on Neo early on the morning due to low bp while on Porpofol then became HTN after extubated. On Clevedipin drip NOW . Will consider starting Oral meds when able to swallow . Long-term BP goal <!40/90  Hyperlipidemia  Home meds:  None   LDL 86, goal < 70  High intensity statin Lipitor 40 mg qhs    Hyperglycemia:   Home meds: None   HgbA1c 10.1, goal < 7.0  CBGs Recent Labs    05/10/21 0708 05/10/21 1119 05/10/21 1608  GLUCAP 184* 174* 180*      SSI  Other Stroke Risk Factors   Cigarette smoker : Needs counseling.   Obesity, Body mass index is 34.28 kg/m., BMI >/= 30 associated with increased stroke risk, recommend weight loss, diet and exercise as appropriate   Suspected Obstructive sleep apnea, consider sleep study as an outpt.   [facility] Critical care team on board Stritct I/O Consider Nephro consult  Respiratory failure,  Extubated ICU team on board.   Hypokalemia -replaced - Monitor   Inadequate PO intake -if unable top extubate start feeding  tube -SLP following     Hospital day # 3  Patient is neurologically stable but still significantly aphasic.  Start oral diet as well as medications.  We will put him on aspirin and Plavix for 3  months given intracranial atherosclerosis which likely cause of stroke.  Mobilize out of bed.  Continue ongoing therapies.  Transfer to neurology floor bed.  Will likely need inpatient rehab.  Long discussion with the daughter at the bedside and answered questions. This patient is critically ill and at significant risk of neurological worsening, death and care requires constant monitoring of vital signs, hemodynamics,respiratory and cardiac monitoring, extensive review of multiple databases, frequent neurological assessment, discussion with family, other specialists and medical decision making of high complexity.I have made any additions or clarifications directly to the above note.This critical care time does not reflect procedure time, or teaching time or supervisory time of PA/NP/Med Resident etc but could involve care discussion time.  I spent 30 minutes of neurocritical care time  in the care of  this patient.  Delia Heady, MD  To contact Stroke Continuity provider, please refer to [email or web address]. After hours, contact General Neurology

## 2021-05-10 NOTE — Progress Notes (Signed)
Referring Physician(s): Code Stroke - Dr. Tollie Eth.   Supervising Physician: Julieanne Cotton  Patient Status:  [facility] - In-pt  Chief Complaint: History of acute CVA s/p cerebral arteriogram with emergent mechanical thrombectomy of left MCA M2/M3 junction occulusion achieving occlusion achieving a TICI 2c revascularizing via right femoral approach  On 6.4.22 by Dr. Julieanne Cotton   Subjective:  Patient laying in bed extubated. Daughter and sister at bedside. Global expressive aphasia noted. Patient replies yes to all questions.  Allergies: Patient has no known allergies.  Medications: Prior to Admission medications   Not on File     Vital Signs: BP (!) 170/98   Pulse 98   Temp 97.9 F (36.6 C) (Oral)   Resp 17   Ht 6\' 3"  (1.905 m)   Wt 274 lb 4 oz (124.4 kg) Comment: bed weight  SpO2 90%   BMI 34.28 kg/m   Physical Exam Vitals and nursing note reviewed.  Constitutional:      Appearance: He is well-developed.  HENT:     Head: Normocephalic.  Pulmonary:     Effort: Pulmonary effort is normal.  Musculoskeletal:        General: Normal range of motion.     Cervical back: Normal range of motion.  Skin:    General: Skin is dry.  Neurological:     Mental Status: He is alert.     Comments: Alert, Global expressive aqaphadia. Unable follow commands.  Mild facial droop noted Can spontaneously move all 4 extremities with right sided weakness.. Hand grip strength greater on left. Distal pulses (DP's) palpable bilaterally per bedside RN.           Imaging: CT HEAD WO CONTRAST  Result Date: 05/08/2021 CLINICAL DATA:  Follow-up left MCA branch vessel occlusion EXAM: CT HEAD WITHOUT CONTRAST TECHNIQUE: Contiguous axial images were obtained from the base of the skull through the vertex without intravenous contrast. COMPARISON:  Multiple examinations yesterday. FINDINGS: Brain: More pronounced low density present in the left posterior temporal lobe,  temporoparietal junction and parietal low consistent with the known acute infarction. Maximal dimension of this region is about 8.5 cm. Swelling but no significant mass effect or midline shift. Resolution of adjacent hyperdensity which was probably contrast staining from the previous intervention. No sign of hemorrhage. No new infarction. Mild chronic small-vessel ischemic change elsewhere affecting the hemispheric white matter Vascular: There is atherosclerotic calcification of the major vessels at the base of the brain. Skull: Negative Sinuses/Orbits: Scattered sinus opacification, most pronounced within the left maxillary sinus. Orbits negative. Other: None IMPRESSION: More pronounced low-density within the left MCA branch vessel infarction as described above. No midline shift or hemorrhage. Diminishing density of the contrast staining adjacent to the infarction. Electronically Signed   By: Paulina Fusi M.D.   On: 05/08/2021 13:45   CT HEAD WO CONTRAST  Result Date: 05/07/2021 CLINICAL DATA:  Left MCA peripheral occlusion status post percutaneous thrombectomy. EXAM: CT HEAD WITHOUT CONTRAST TECHNIQUE: Contiguous axial images were obtained from the base of the skull through the vertex without intravenous contrast. COMPARISON:  Intraoperative cone beam CT 05/07/2021 at 2:42 p.m., CT head 11:15 a.m. FINDINGS: Brain: There is again identified high attenuation material intravascularly within the distal left MCA branches in the region of the sylvian fissure posteriorly. There is petechial hemorrhage involving the temporal operculum in keeping with a a small cortical infarct in this region, best appreciated on axial image # 17/6. Trace subarachnoid hemorrhage is noted within the  sylvian fissure at this level as well as within the sulci of the left para falcine parietal lobe. This appears stable when compared to prior cone beam CT arteriogram. There is no significant associated mass effect. No midline shift. No foci  of acute intracranial hemorrhage. Ventricular size is normal. Cerebellum is unremarkable. Vascular: No new asymmetric hyperdensity within the a intravascular spaces at the skull base. Skull: Intact Sinuses/Orbits: There is opacification of the left maxillary sinus, unchanged as well as mild mucosal thickening within the ethmoid air cells bilaterally. The orbits are unremarkable. Other: Mastoid air cells and middle ear cavities are clear. IMPRESSION: Stable hyperdensity within the terminal left MCA branches in the region of the posterior left sylvian fissure in keeping with intraluminal thrombus and/or contrast as well as petechial hemorrhage involving a small adjacent cortical infarct involving the left temporal operculum. Stable small subarachnoid hemorrhage in the region of the a cortical infarct as well as the left parietal lobe. No abnormal mass effect. Electronically Signed   By: [name]   On: 05/07/2021 20:44   MR BRAIN WO CONTRAST  Result Date: 05/09/2021 CLINICAL DATA:  Stroke follow-up. EXAM: MRI HEAD WITHOUT CONTRAST TECHNIQUE: Multiplanar, multiecho pulse sequences of the brain and surrounding structures were obtained without intravenous contrast. COMPARISON:  CT head May 08, 2021. FINDINGS: Brain: Large, confluent acute posterior left MCA territory infarct. Additional smaller acute infarcts scattered throughout the left frontal and temporal lobes and left insular region. Associated edema and regional mass effect. No midline shift. Basal cisterns are patent. Additional mild scattered T2/FLAIR hyperintensities within the white matter, compatible with chronic microvascular ischemic disease. Inferior right basal ganglia dilated perivascular space. Areas of ill-defined susceptibility artifact and T1 hyperintensity along the posteromedial aspect of the confluent infarct, likely petechial hemorrhage. No hydrocephalus. Vascular: Major arterial flow voids of the skull base are maintained. Curvilinear  susceptibility artifact within the posterior aspect of the left sylvian fissure, extending into the region of confluent infarct, most likely representing distal left MCA thrombus that was better characterized on prior vascular studies. Skull and upper cervical spine: Normal marrow signal. Sinuses/Orbits: Left greater than right maxillary sinus mucosal thickening with scattered ethmoid air cell opacification. Other: Moderate bilateral mastoid effusions. IMPRESSION: 1. Large, confluent acute posterior left MCA territory infarct with smaller acute infarcts scattered throughout the left frontotemporal lobes and left insular region. Associated edema and regional mass effect without midline shift. Areas of petechial hemorrhage along the posteromedial aspect of the confluent hemorrhage. 2. Curvilinear susceptibility artifact within the posterior aspect of the left sylvian fissure, extending into the region of confluent infarct, most likely representing distal left MCA thrombus that was better characterized on prior vascular studies. 3. Paranasal sinus mucosal thickening and bilateral mastoid effusions. Electronically Signed   By: Feliberto Harts MD   On: 05/09/2021 17:27   IR CT Head Ltd  Result Date: 05/10/2021 INDICATION: New onset of dense aphasia and moderate right upper extremity weakness. Prominent M2 branch of the inferior division of the left MCA on the CT angiogram of the head and neck. EXAM: 1. EMERGENT LARGE VESSEL OCCLUSION THROMBOLYSIS (anterior CIRCULATION) COMPARISON:  CT angiogram of the head and neck of May 07, 2021. MEDICATIONS: Ancef 2 g IV antibiotic was administered within 1 hour of the procedure. ANESTHESIA/SEDATION: General anesthesia CONTRAST:  Isovue 300 approximately 100 mL. FLUOROSCOPY TIME:  Fluoroscopy Time: 43 minutes 6 seconds (2092 mGy). COMPLICATIONS: None immediate. TECHNIQUE: Following a full explanation of the procedure along with the potential associated  complications, an informed  witnessed consent was obtained. The risks of intracranial hemorrhage of 10%, worsening neurological deficit, ventilator dependency, death and inability to revascularize were all reviewed in detail with the patient's daughter. The patient was then put under general anesthesia by the Department of Anesthesiology at [facility]. The right groin was prepped and draped in the usual sterile fashion. Thereafter using modified Seldinger technique, transfemoral access into the right common femoral artery was obtained without difficulty. Over a 0.035 inch guidewire an 8 [place] Pinnacle 25 cm sheath was inserted. Through this, and also over a 0.035 inch guidewire a 5.5 Berenstein support catheter inside of an 087 balloon guide catheter was advanced to the aortic arch and selectively positioned in the left common carotid artery. The guidewire and support catheter were removed. Good aspiration was obtained from the hub of the balloon guide catheter. An angiogram was then performed centered extra cranially and intracranially. FINDINGS: The left common carotid arteriogram demonstrates the left external carotid artery and its major branches to be widely patent. The left internal carotid artery at the bulb to the cranial skull base is widely patent with mild to moderate tortuosity at the distal cervical segment. More distally the petrous, the cavernous and the supraclinoid segments are widely patent. The left middle and its branches opacify except for a distal inferior division branch in the distal M2 M3 region. A moderate sized area of hypoperfusion is seen involving the parietal cortical subcortical region. The left anterior cerebral artery opacifies into the capillary and venous phases. PROCEDURE: Over a 0.014 inch standard Synchro micro guidewire a combination of a 35 136 cm Zoom catheter inside of a 55 132 cm Zoom aspiration catheter was advanced to the supraclinoid left ICA. The micro guidewire was then gently advanced  using a torque device without difficulty to the occluded distal M2 M3 region of the inferior division of the left middle cerebral artery followed by the microcatheter. The micro guidewire was then gently manipulated with a torque device and teased gently. However, significant resistance was encountered to the advancement of the micro guidewire. The 35 Zoom catheter was then gently advanced to the occluded site. The micro guidewire was manipulated without success through the occluded segment. The 35 Zoom aspiration catheter was embedded in the occlusion. Constant aspiration was applied at the hub of the 35 Zoom aspiration catheter, and also in the Penumbra aspiration device at the hub of the 55 Zoom aspiration catheter which was advanced more distally. After 2-1/2 minutes, the combination of the 55 and the 35 Zoom aspiration catheter was retrieved until there was free flow of blood at the hub of 55 aspiration Zoom catheter. A control arteriogram performed through the 55 Zoom aspiration catheter demonstrated no significant change in the occluded inferior divisions distal segment. A second pass was made again using the above combination. An 027 Marksman 160 cm microcatheter was advanced inside of the 55 Zoom aspiration catheter over a 0.14 inch Synchro micro guidewire as described above without difficulty. At this time, an 014 Aristotle guidewire was utilized. Significant resistance was again encountered to the advancement of the micro guidewire, and also the microcatheter which was imbedded in the occlusion. The micro guidewire was retrieved and removed. Again constant aspiration was applied at the hub of the 027 microcatheter, and also the Zoom aspiration catheter as described earlier with Penumbra aspiration device for approximately 2 minutes. The combination was then retrieved and removed. Control arteriogram performed through the 55 Zoom aspiration catheter demonstrated no  change in the occlusion. A third pass was  then made at this time using the 014 inch Aristotle micro guidewire with an 021 Trevo ProVue microcatheter. The micro guidewire was advanced to the occluded middle cerebral artery branch followed by the microcatheter. Gentle rotational and to and fro slow movements were performed with the microcatheter and micro guidewire with the micro guidewire now penetrating more distally into the distal M3 region followed by the microcatheter. The guidewire was removed. The microcatheter was retrieved more proximally until free aspiration of blood was noted just proximal to the occlusion. A gentle control arteriogram performed through the microcatheter now demonstrated a focal area of irregularity with severe stenosis. Distal to this the vessel remained occluded. A fourth pass was then made this time again using a 35 Zoom aspiration catheter with a 55 Zoom aspiration catheter advanced in combination again over a micro guidewire just proximal to the pre occlusive stenosis. The micro guidewire was gently advanced past this followed by the 35 Zoom aspiration catheter into the mid M3 region. The guidewire was removed. Slow aspiration obtained from the hub of the 35 Zoom aspiration catheter. The 55 Zoom aspiration catheter was now advanced to just proximal to the occluded branch. Thereafter, constant aspiration was applied at the hub of both the catheters for approximately 2 minutes. Following removal, a control arteriogram performed through the balloon guide in the left internal carotid artery now demonstrated flow through the occluded portion of the vessel with a tight focal pre occlusive stenosis noted at this site. A TICI 2C revascularization was noted. Given the underlying severe stenosis probably related to intracranial arteriosclerosis, the option of rescue a stent was considered. However, CT of the brain at that time demonstrated a focal area of contrast in the subarachnoid space and possibly in the adjacent cortical region.  In view of this, and also the CT perfusion scan demonstrating a core at this site, the potential risks of hemorrhage with the use of dual antiplatelets was felt to outweigh the potential benefits. The procedure was, therefore, stopped. A control arteriogram performed through the balloon guide in the left internal carotid artery demonstrated continued TICI 2C revascularization. No mass-effect or midline shift was noted. An 8 French Angio-Seal closure device was used for hemostasis at the right groin puncture site. Distal pulses remained present bilaterally unchanged. The patient was then extubated. Upon recovery, the patient was very combative and not being able to communicate due to the global aphasia. It was decided to reintubate the patient for better control of the patient's blood pressure for at least 24 hours. The patient was then transferred to the PACU and then neuro ICU. IMPRESSION: Status post endovascular revascularization of occluded branch of the inferior division of the left middle cerebral artery, with contact aspiration x4 with a TICI 2C revascularization with reocclusion of the branch. PLAN: As per referring MD. Electronically Signed   By: Julieanne Cotton M.D.   On: 05/09/2021 10:47   CT CEREBRAL PERFUSION W CONTRAST  Result Date: 05/07/2021 CLINICAL DATA:  55 year old male code stroke presentation with posterior left MCA infarct visible on plain CT. EXAM: CT ANGIOGRAPHY HEAD AND NECK CT PERFUSION BRAIN TECHNIQUE: Multidetector CT imaging of the head and neck was performed using the standard protocol during bolus administration of intravenous contrast. Multiplanar CT image reconstructions and MIPs were obtained to evaluate the vascular anatomy. Carotid stenosis measurements (when applicable) are obtained utilizing NASCET criteria, using the distal internal carotid diameter as the denominator. Multiphase CT imaging of  the brain was performed following IV bolus contrast injection. Subsequent  parametric perfusion maps were calculated using RAPID software. CONTRAST:  100mL OMNIPAQUE IOHEXOL 350 MG/ML SOLN COMPARISON:  Plain head CT 1116 hours. FINDINGS: CT Brain Perfusion Findings: ASPECTS: 7 CBF (<30%) Volume: 42mL Perfusion (Tmax>6.0s) volume: 70mL.  Hypoperfusion index 0.5 Mismatch Volume: 28mL, mismatch ratio 1.7 Infarction Location:Posterior left MCA CTA NECK Skeleton: Carious posterior dentition. Minimal cervical spine degeneration. No acute osseous abnormality identified. Upper chest: Negative. Other neck: Postinflammatory dystrophic calcifications of the palatine tonsils. Left maxillary sinus disease. Otherwise negative. Aortic arch: Mildly tortuous aortic arch. Three vessel arch configuration without arch atherosclerosis. Right carotid system: Mildly tortuous brachiocephalic artery. Negative right CCA and right carotid bifurcation. Tortuous right ICA distal to the bulb but no stenosis to the skull base. Left carotid system: Negative left CCA. Minimal calcified plaque at the left carotid bifurcation. Tortuous left ICA below the skull base without stenosis. Vertebral arteries: Normal proximal right subclavian artery and right vertebral artery origin. The right vertebral is patent and normal to the skull base. Normal proximal left subclavian artery and left vertebral artery origin. The left vertebral is patent and normal to the skull base. CTA HEAD Posterior circulation: Codominant vertebral arteries. Patent V4 segments with no stenosis on the left, but there is a severe stenosis at the right vertebrobasilar junction best seen on series 9, image 29. Normal PICA origins. Patent basilar artery with mild irregularity but no significant stenosis. Patent SCA and PCA origins. Posterior communicating arteries are diminutive or absent. Bilateral PCA irregularity in keeping with intracranial atherosclerosis. Moderate to severe stenosis of the right P1/P2 junction, and mild left PCA stenoses on series 8,  image 17. Anterior circulation: Both ICA siphons are patent. On the left there is mild calcified plaque with no significant siphon stenosis. Normal left ophthalmic artery origin. Patent left ICA terminus. Mild right siphon calcified plaque without stenosis. Normal right ophthalmic artery origin and right ICA terminus. Patent MCA and ACA origins. Mild stenosis at the right A1 origin on series 9, image 24. Anterior communicating artery and ACA A2 segments are within normal limits. There is a moderate to severe distal left A2/pericallosal stenosis on series 10, image 23. Right MCA M1 and right MCA trifurcation are patent without stenosis. Right MCA branches are within normal limits. Left MCA M1 and left MCA trifurcation are patent without stenosis. There is occlusion of a posterior right MCA branch, distal M2 or proximal M3 seen on series 7, images 155-157. This is also on series 10, image 31. The other left MCA branches appear patent with mild irregularity. Venous sinuses: Early contrast timing, not well evaluated. Anatomic variants: None. Review of the MIP images confirms the above findings Left MCA findings reviewed in person Dr. Erick BlinksSALMAN KHALIQDINA on 05/07/2021 at 1135 hours, and discussed again by telephone at 11:45 . IMPRESSION: 1. Positive for occlusion of a posterior Left MCA branch, distal M2 versus proximal M3. Associated posterior left MCA territory infarct core of 42 mL and 70 mL estimated penumbra by CTP (mismatch of 28 mL). This was discussed with Dr. Erick BlinksSALMAN KHALIQDINA on 05/07/2021 beginning at 1135 hours. 2. 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 - moderate to severe stenoses of the Right PCA P1/P2 junction. 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 Electronically Signed   By: Odessa FlemingH  Hall M.D.   On: 05/07/2021 11:57   DG CHEST PORT  1 VIEW  Addendum Date: 05/09/2021    ADDENDUM REPORT: 05/09/2021 08:05 ADDENDUM: Sentence in the impression should read: Endotracheal tube as described without pneumothorax. Electronically Signed   By: Bretta Bang III M.D.   On: 05/09/2021 08:05   Result Date: 05/09/2021 CLINICAL DATA:  Hypoxia EXAM: PORTABLE CHEST 1 VIEW COMPARISON:  May 07, 2021 FINDINGS: Endotracheal tube tip is 3.9 cm above the carina. No pneumothorax. There is cardiomegaly with pulmonary venous hypertension. There is ill-defined airspace opacity in each lower lung region. No adenopathy. No bone lesions. IMPRESSION: Endotracheal tube as described pneumothorax. Cardiomegaly with pulmonary vascular congestion. Airspace opacity in each lower lung region may represent bilateral pneumonia. A degree of pulmonary edema may present in this manner. Both edema and pneumonia may be present concurrently. Electronically Signed: By: Bretta Bang III M.D. On: 05/08/2021 14:01   DG CHEST PORT 1 VIEW  Result Date: 05/09/2021 CLINICAL DATA:  Hypoxia EXAM: PORTABLE CHEST 1 VIEW COMPARISON:  May 08, 2021 FINDINGS: Endotracheal tube tip is 5.5 cm above the carina. No pneumothorax. There is persistent cardiomegaly with pulmonary venous hypertension. There is ill-defined opacity in each lower lung region, stable. No new opacity. No adenopathy. No bone lesions. IMPRESSION: Endotracheal tube as described without pneumothorax. Ill-defined opacity in each lung base may represent bilateral pneumonia is stable. There may be pulmonary edema as well. Appearance stable. Stable cardiomegaly with a degree of cardiomegaly with pulmonary vascular congestion. Electronically Signed   By: Bretta Bang III M.D.   On: 05/09/2021 08:04   Portable Chest x-ray  Result Date: 05/07/2021 CLINICAL DATA:  Code stroke.  Endotracheal tube. EXAM: PORTABLE CHEST 1 VIEW COMPARISON:  None. FINDINGS: Endotracheal tube appears well positioned with tip approximately 3 cm above the carina. Cardiomegaly. Lungs are  clear. No pleural effusion or pneumothorax is seen. Osseous structures about the chest are unremarkable. IMPRESSION: 1. Endotracheal tube well positioned with tip approximately 3 cm above the carina. 2. Cardiomegaly. 3. Lungs are clear. Electronically Signed   By: Bary Richard M.D.   On: 05/07/2021 15:57   ECHOCARDIOGRAM COMPLETE BUBBLE STUDY  Result Date: 05/08/2021    ECHOCARDIOGRAM REPORT   Patient Name:   KARO ROG Date of Exam: 05/08/2021 Medical Rec #:  [id number]      Height:       75.0 in Accession #:    [id number]     Weight:       274.3 lb Date of Birth:  06-18-1966      BSA:          2.510 m Patient Age:    55 years       BP:           104/61 mmHg Patient Gender: M              HR:           65 bpm. Exam Location:  Inpatient Procedure: 2D Echo and Saline Contrast Bubble Study Indications:    stroke  History:        Patient has no prior history of Echocardiogram examinations.  Sonographer:    Delcie Roch Referring Phys: [id number] [facility] KHALIQDINA IMPRESSIONS  1. Left ventricular ejection fraction, by estimation, is 55 to 60%. The left ventricle has normal function. The left ventricle has no regional wall motion abnormalities. There is moderate left ventricular hypertrophy. Left ventricular diastolic parameters are indeterminate.  2. Right ventricular systolic function is normal. The right ventricular size is normal. Tricuspid regurgitation signal  is inadequate for assessing PA pressure.  3. The mitral valve is grossly normal. Trivial mitral valve regurgitation.  4. The aortic valve is tricuspid. Aortic valve regurgitation is not visualized.  5. The inferior vena cava is normal in size with greater than 50% respiratory variability, suggesting right atrial pressure of 3 mmHg.  6. No visualization of agitated saline bubbles in right heart with reported injection from IV access both upper extremities, possibly inadequate agitation prior to injection. Consider repeat study if clinically indicated.  FINDINGS  Left Ventricle: Left ventricular ejection fraction, by estimation, is 55 to 60%. The left ventricle has normal function. The left ventricle has no regional wall motion abnormalities. The left ventricular internal cavity size was normal in size. There is  moderate left ventricular hypertrophy. Left ventricular diastolic parameters are indeterminate. Right Ventricle: The right ventricular size is normal. No increase in right ventricular wall thickness. Right ventricular systolic function is normal. Tricuspid regurgitation signal is inadequate for assessing PA pressure. Left Atrium: Left atrial size was normal in size. Right Atrium: Right atrial size was normal in size. Pericardium: There is no evidence of pericardial effusion. Mitral Valve: The mitral valve is grossly normal. Trivial mitral valve regurgitation. Tricuspid Valve: The tricuspid valve is grossly normal. Tricuspid valve regurgitation is trivial. Aortic Valve: The aortic valve is tricuspid. There is mild aortic valve annular calcification. Aortic valve regurgitation is not visualized. Pulmonic Valve: The pulmonic valve was grossly normal. Pulmonic valve regurgitation is trivial. Aorta: The aortic root is normal in size and structure. Venous: The inferior vena cava is normal in size with greater than 50% respiratory variability, suggesting right atrial pressure of 3 mmHg. IAS/Shunts: No atrial level shunt detected by color flow Doppler. Agitated saline contrast was given intravenously to evaluate for intracardiac shunting.  LEFT VENTRICLE PLAX 2D LVIDd:         5.70 cm  Diastology LVIDs:         3.50 cm  LV e' medial:    4.90 cm/s LV PW:         1.20 cm  LV E/e' medial:  12.1 LV IVS:        1.40 cm  LV e' lateral:   5.77 cm/s LVOT diam:     2.20 cm  LV E/e' lateral: 10.2 LV SV:         73 LV SV Index:   29 LVOT Area:     3.80 cm  RIGHT VENTRICLE             IVC RV S prime:     16.80 cm/s  IVC diam: 1.80 cm TAPSE (M-mode): 1.8 cm LEFT ATRIUM              Index       RIGHT ATRIUM           Index LA diam:        4.50 cm 1.79 cm/m  RA Area:     20.60 cm LA Vol (A2C):   68.7 ml 27.38 ml/m RA Volume:   59.60 ml  23.75 ml/m LA Vol (A4C):   48.7 ml 19.41 ml/m LA Biplane Vol: 59.9 ml 23.87 ml/m  AORTIC VALVE LVOT Vmax:   111.00 cm/s LVOT Vmean:  66.000 cm/s LVOT VTI:    0.192 m  AORTA Ao Root diam: 3.70 cm Ao Asc diam:  3.60 cm MITRAL VALVE MV Area (PHT): 2.95 cm    SHUNTS MV Decel Time: 257 msec    Systemic VTI:  0.19 m MV E velocity: 59.10 cm/s  Systemic Diam: 2.20 cm MV A velocity: 76.70 cm/s MV E/A ratio:  0.77 Nona Dell MD Electronically signed by Nona Dell MD Signature Date/Time: 05/08/2021/5:08:48 PM    Final    IR PERCUTANEOUS ART THROMBECTOMY/INFUSION INTRACRANIAL INC DIAG ANGIO  Result Date: 05/10/2021 INDICATION: New onset of dense aphasia and moderate right upper extremity weakness. Prominent M2 branch of the inferior division of the left MCA on the CT angiogram of the head and neck. EXAM: 1. EMERGENT LARGE VESSEL OCCLUSION THROMBOLYSIS (anterior CIRCULATION) COMPARISON:  CT angiogram of the head and neck of May 07, 2021. MEDICATIONS: Ancef 2 g IV antibiotic was administered within 1 hour of the procedure. ANESTHESIA/SEDATION: General anesthesia CONTRAST:  Isovue 300 approximately 100 mL. FLUOROSCOPY TIME:  Fluoroscopy Time: 43 minutes 6 seconds (2092 mGy). COMPLICATIONS: None immediate. TECHNIQUE: Following a full explanation of the procedure along with the potential associated complications, an informed witnessed consent was obtained. The risks of intracranial hemorrhage of 10%, worsening neurological deficit, ventilator dependency, death and inability to revascularize were all reviewed in detail with the patient's daughter. The patient was then put under general anesthesia by the Department of Anesthesiology at [facility]. The right groin was prepped and draped in the usual sterile fashion. Thereafter using modified Seldinger  technique, transfemoral access into the right common femoral artery was obtained without difficulty. Over a 0.035 inch guidewire an 8 [place] Pinnacle 25 cm sheath was inserted. Through this, and also over a 0.035 inch guidewire a 5.5 Berenstein support catheter inside of an 087 balloon guide catheter was advanced to the aortic arch and selectively positioned in the left common carotid artery. The guidewire and support catheter were removed. Good aspiration was obtained from the hub of the balloon guide catheter. An angiogram was then performed centered extra cranially and intracranially. FINDINGS: The left common carotid arteriogram demonstrates the left external carotid artery and its major branches to be widely patent. The left internal carotid artery at the bulb to the cranial skull base is widely patent with mild to moderate tortuosity at the distal cervical segment. More distally the petrous, the cavernous and the supraclinoid segments are widely patent. The left middle and its branches opacify except for a distal inferior division branch in the distal M2 M3 region. A moderate sized area of hypoperfusion is seen involving the parietal cortical subcortical region. The left anterior cerebral artery opacifies into the capillary and venous phases. PROCEDURE: Over a 0.014 inch standard Synchro micro guidewire a combination of a 35 136 cm Zoom catheter inside of a 55 132 cm Zoom aspiration catheter was advanced to the supraclinoid left ICA. The micro guidewire was then gently advanced using a torque device without difficulty to the occluded distal M2 M3 region of the inferior division of the left middle cerebral artery followed by the microcatheter. The micro guidewire was then gently manipulated with a torque device and teased gently. However, significant resistance was encountered to the advancement of the micro guidewire. The 35 Zoom catheter was then gently advanced to the occluded site. The micro guidewire was  manipulated without success through the occluded segment. The 35 Zoom aspiration catheter was embedded in the occlusion. Constant aspiration was applied at the hub of the 35 Zoom aspiration catheter, and also in the Penumbra aspiration device at the hub of the 55 Zoom aspiration catheter which was advanced more distally. After 2-1/2 minutes, the combination of the 55 and the 35 Zoom aspiration catheter was  retrieved until there was free flow of blood at the hub of 55 aspiration Zoom catheter. A control arteriogram performed through the 55 Zoom aspiration catheter demonstrated no significant change in the occluded inferior divisions distal segment. A second pass was made again using the above combination. An 027 Marksman 160 cm microcatheter was advanced inside of the 55 Zoom aspiration catheter over a 0.14 inch Synchro micro guidewire as described above without difficulty. At this time, an 014 Aristotle guidewire was utilized. Significant resistance was again encountered to the advancement of the micro guidewire, and also the microcatheter which was imbedded in the occlusion. The micro guidewire was retrieved and removed. Again constant aspiration was applied at the hub of the 027 microcatheter, and also the Zoom aspiration catheter as described earlier with Penumbra aspiration device for approximately 2 minutes. The combination was then retrieved and removed. Control arteriogram performed through the 55 Zoom aspiration catheter demonstrated no change in the occlusion. A third pass was then made at this time using the 014 inch Aristotle micro guidewire with an 021 Trevo ProVue microcatheter. The micro guidewire was advanced to the occluded middle cerebral artery branch followed by the microcatheter. Gentle rotational and to and fro slow movements were performed with the microcatheter and micro guidewire with the micro guidewire now penetrating more distally into the distal M3 region followed by the microcatheter.  The guidewire was removed. The microcatheter was retrieved more proximally until free aspiration of blood was noted just proximal to the occlusion. A gentle control arteriogram performed through the microcatheter now demonstrated a focal area of irregularity with severe stenosis. Distal to this the vessel remained occluded. A fourth pass was then made this time again using a 35 Zoom aspiration catheter with a 55 Zoom aspiration catheter advanced in combination again over a micro guidewire just proximal to the pre occlusive stenosis. The micro guidewire was gently advanced past this followed by the 35 Zoom aspiration catheter into the mid M3 region. The guidewire was removed. Slow aspiration obtained from the hub of the 35 Zoom aspiration catheter. The 55 Zoom aspiration catheter was now advanced to just proximal to the occluded branch. Thereafter, constant aspiration was applied at the hub of both the catheters for approximately 2 minutes. Following removal, a control arteriogram performed through the balloon guide in the left internal carotid artery now demonstrated flow through the occluded portion of the vessel with a tight focal pre occlusive stenosis noted at this site. A TICI 2C revascularization was noted. Given the underlying severe stenosis probably related to intracranial arteriosclerosis, the option of rescue a stent was considered. However, CT of the brain at that time demonstrated a focal area of contrast in the subarachnoid space and possibly in the adjacent cortical region. In view of this, and also the CT perfusion scan demonstrating a core at this site, the potential risks of hemorrhage with the use of dual antiplatelets was felt to outweigh the potential benefits. The procedure was, therefore, stopped. A control arteriogram performed through the balloon guide in the left internal carotid artery demonstrated continued TICI 2C revascularization. No mass-effect or midline shift was noted. An 8 French  Angio-Seal closure device was used for hemostasis at the right groin puncture site. Distal pulses remained present bilaterally unchanged. The patient was then extubated. Upon recovery, the patient was very combative and not being able to communicate due to the global aphasia. It was decided to reintubate the patient for better control of the patient's blood pressure for at least  24 hours. The patient was then transferred to the PACU and then neuro ICU. IMPRESSION: Status post endovascular revascularization of occluded branch of the inferior division of the left middle cerebral artery, with contact aspiration x4 with a TICI 2C revascularization with reocclusion of the branch. PLAN: As per referring MD. Electronically Signed   By: Julieanne Cotton M.D.   On: 05/09/2021 10:47   CT HEAD CODE STROKE WO CONTRAST  Result Date: 05/07/2021 CLINICAL DATA:  Code stroke.  55 year old male neurologic deficit. EXAM: CT HEAD WITHOUT CONTRAST TECHNIQUE: Contiguous axial images were obtained from the base of the skull through the vertex without intravenous contrast. COMPARISON:  None. FINDINGS: Brain: Confluent cytotoxic edema in the posterior left MCA territory affecting the posterosuperior temporal gyrus through the left parietal lobe. No hemorrhagic transformation. No significant mass effect. The insula, internal capsule and deep gray matter nuclei appear spared. Gray-white matter differentiation elsewhere within normal limits. No ventriculomegaly. Normal basilar cisterns. Vascular: Calcified atherosclerosis at the skull base. Suspicious hyperdensity in a posterior left MCA branch along the anterior margin of the cytotoxic edema series 2, image 17 and series 6, image 45. But no more proximal suspicious vessel hyperdensity is identified. Skull: Negative. Sinuses/Orbits: Subtotal opacification of the left maxillary sinus due to a mucosal thickening and/or retention cyst. Other Visualized paranasal sinuses and mastoids are  clear. Other: Visualized orbits and scalp soft tissues are within normal limits. ASPECTS [facility] Stroke Program Early CT Score) - Ganglionic level infarction (caudate, lentiform nuclei, internal capsule, insula, M1-M3 cortex): 6 (abnormal M3) - Supraganglionic infarction (M4-M6 cortex): 1 Total score (0-10 with 10 being normal): 7 IMPRESSION: 1. 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 ASPECTS 7. 2. No hemorrhagic transformation or mass effect. Suspicion of a posterior left MCA branch occlusion at the posterior sylvian fissure. Study discussed by telephone with Dr. Erick Blinks on 05/07/2021 at 11:20 . Electronically Signed   By: Odessa Fleming M.D.   On: 05/07/2021 11:23   CT ANGIO HEAD CODE STROKE  Result Date: 05/07/2021 CLINICAL DATA:  55 year old male code stroke presentation with posterior left MCA infarct visible on plain CT. EXAM: CT ANGIOGRAPHY HEAD AND NECK CT PERFUSION BRAIN TECHNIQUE: Multidetector CT imaging of the head and neck was performed using the standard protocol during bolus administration of intravenous contrast. Multiplanar CT image reconstructions and MIPs were obtained to evaluate the vascular anatomy. Carotid stenosis measurements (when applicable) are obtained utilizing NASCET criteria, using the distal internal carotid diameter as the denominator. Multiphase CT imaging of the brain was performed following IV bolus contrast injection. Subsequent parametric perfusion maps were calculated using RAPID software. CONTRAST:  OMNIPAQUE IOHEXOL 350 MG/ML SOLN COMPARISON:  Plain head CT 1116 hours. FINDINGS: CT Brain Perfusion Findings: ASPECTS: 7 CBF (<30%) Volume: 42mL Perfusion (Tmax>6.0s) volume: 70mL.  Hypoperfusion index 0.5 Mismatch Volume: 28mL, mismatch ratio 1.7 Infarction Location:Posterior left MCA CTA NECK Skeleton: Carious posterior dentition. Minimal cervical spine degeneration. No acute osseous abnormality identified. Upper chest: Negative. Other  neck: Postinflammatory dystrophic calcifications of the palatine tonsils. Left maxillary sinus disease. Otherwise negative. Aortic arch: Mildly tortuous aortic arch. Three vessel arch configuration without arch atherosclerosis. Right carotid system: Mildly tortuous brachiocephalic artery. Negative right CCA and right carotid bifurcation. Tortuous right ICA distal to the bulb but no stenosis to the skull base. Left carotid system: Negative left CCA. Minimal calcified plaque at the left carotid bifurcation. Tortuous left ICA below the skull base without stenosis. Vertebral arteries: Normal proximal right subclavian artery  and right vertebral artery origin. The right vertebral is patent and normal to the skull base. Normal proximal left subclavian artery and left vertebral artery origin. The left vertebral is patent and normal to the skull base. CTA HEAD Posterior circulation: Codominant vertebral arteries. Patent V4 segments with no stenosis on the left, but there is a severe stenosis at the right vertebrobasilar junction best seen on series 9, image 29. Normal PICA origins. Patent basilar artery with mild irregularity but no significant stenosis. Patent SCA and PCA origins. Posterior communicating arteries are diminutive or absent. Bilateral PCA irregularity in keeping with intracranial atherosclerosis. Moderate to severe stenosis of the right P1/P2 junction, and mild left PCA stenoses on series 8, image 17. Anterior circulation: Both ICA siphons are patent. On the left there is mild calcified plaque with no significant siphon stenosis. Normal left ophthalmic artery origin. Patent left ICA terminus. Mild right siphon calcified plaque without stenosis. Normal right ophthalmic artery origin and right ICA terminus. Patent MCA and ACA origins. Mild stenosis at the right A1 origin on series 9, image 24. Anterior communicating artery and ACA A2 segments are within normal limits. There is a moderate to severe distal left  A2/pericallosal stenosis on series 10, image 23. Right MCA M1 and right MCA trifurcation are patent without stenosis. Right MCA branches are within normal limits. Left MCA M1 and left MCA trifurcation are patent without stenosis. There is occlusion of a posterior right MCA branch, distal M2 or proximal M3 seen on series 7, images 155-157. This is also on series 10, image 31. The other left MCA branches appear patent with mild irregularity. Venous sinuses: Early contrast timing, not well evaluated. Anatomic variants: None. Review of the MIP images confirms the above findings Left MCA findings reviewed in person Dr. Erick Blinks on 05/07/2021 at 1135 hours, and discussed again by telephone at 11:45 . IMPRESSION: 1. Positive for occlusion of a posterior Left MCA branch, distal M2 versus proximal M3. Associated posterior left MCA territory infarct core of 42 mL and 70 mL estimated penumbra by CTP (mismatch of 28 mL). This was discussed with Dr. Erick Blinks on 05/07/2021 beginning at 1135 hours. 2. 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 - moderate to severe stenoses of the Right PCA P1/P2 junction. 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 Electronically Signed   By: Odessa Fleming M.D.   On: 05/07/2021 11:57   CT ANGIO NECK CODE STROKE  Result Date: 05/07/2021 CLINICAL DATA:  55 year old male code stroke presentation with posterior left MCA infarct visible on plain CT. EXAM: CT ANGIOGRAPHY HEAD AND NECK CT PERFUSION BRAIN TECHNIQUE: Multidetector CT imaging of the head and neck was performed using the standard protocol during bolus administration of intravenous contrast. Multiplanar CT image reconstructions and MIPs were obtained to evaluate the vascular anatomy. Carotid stenosis measurements (when applicable) are obtained utilizing NASCET criteria, using the distal  internal carotid diameter as the denominator. Multiphase CT imaging of the brain was performed following IV bolus contrast injection. Subsequent parametric perfusion maps were calculated using RAPID software. CONTRAST:  OMNIPAQUE IOHEXOL 350 MG/ML SOLN COMPARISON:  Plain head CT 1116 hours. FINDINGS: CT Brain Perfusion Findings: ASPECTS: 7 CBF (<30%) Volume: 96mL Perfusion (Tmax>6.0s) volume: 30mL.  Hypoperfusion index 0.5 Mismatch Volume: 71mL, mismatch ratio 1.7 Infarction Location:Posterior left MCA CTA NECK Skeleton: Carious posterior dentition. Minimal cervical spine degeneration. No acute osseous abnormality identified.  Upper chest: Negative. Other neck: Postinflammatory dystrophic calcifications of the palatine tonsils. Left maxillary sinus disease. Otherwise negative. Aortic arch: Mildly tortuous aortic arch. Three vessel arch configuration without arch atherosclerosis. Right carotid system: Mildly tortuous brachiocephalic artery. Negative right CCA and right carotid bifurcation. Tortuous right ICA distal to the bulb but no stenosis to the skull base. Left carotid system: Negative left CCA. Minimal calcified plaque at the left carotid bifurcation. Tortuous left ICA below the skull base without stenosis. Vertebral arteries: Normal proximal right subclavian artery and right vertebral artery origin. The right vertebral is patent and normal to the skull base. Normal proximal left subclavian artery and left vertebral artery origin. The left vertebral is patent and normal to the skull base. CTA HEAD Posterior circulation: Codominant vertebral arteries. Patent V4 segments with no stenosis on the left, but there is a severe stenosis at the right vertebrobasilar junction best seen on series 9, image 29. Normal PICA origins. Patent basilar artery with mild irregularity but no significant stenosis. Patent SCA and PCA origins. Posterior communicating arteries are diminutive or absent. Bilateral PCA irregularity in  keeping with intracranial atherosclerosis. Moderate to severe stenosis of the right P1/P2 junction, and mild left PCA stenoses on series 8, image 17. Anterior circulation: Both ICA siphons are patent. On the left there is mild calcified plaque with no significant siphon stenosis. Normal left ophthalmic artery origin. Patent left ICA terminus. Mild right siphon calcified plaque without stenosis. Normal right ophthalmic artery origin and right ICA terminus. Patent MCA and ACA origins. Mild stenosis at the right A1 origin on series 9, image 24. Anterior communicating artery and ACA A2 segments are within normal limits. There is a moderate to severe distal left A2/pericallosal stenosis on series 10, image 23. Right MCA M1 and right MCA trifurcation are patent without stenosis. Right MCA branches are within normal limits. Left MCA M1 and left MCA trifurcation are patent without stenosis. There is occlusion of a posterior right MCA branch, distal M2 or proximal M3 seen on series 7, images 155-157. This is also on series 10, image 31. The other left MCA branches appear patent with mild irregularity. Venous sinuses: Early contrast timing, not well evaluated. Anatomic variants: None. Review of the MIP images confirms the above findings Left MCA findings reviewed in person Dr. Erick Blinks on 05/07/2021 at 1135 hours, and discussed again by telephone at 11:45 . IMPRESSION: 1. Positive for occlusion of a posterior Left MCA branch, distal M2 versus proximal M3. Associated posterior left MCA territory infarct core of 42 mL and 70 mL estimated penumbra by CTP (mismatch of 28 mL). This was discussed with Dr. Erick Blinks on 05/07/2021 beginning at 1135 hours. 2. 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 - moderate to severe stenoses of the Right PCA P1/P2 junction. - moderate to severe stenosis of the Left Pericallosal  Artery (ACA territory). Electronically Signed   By: Odessa Fleming M.D.   On: 05/07/2021 11:57    Labs:  CBC: Recent Labs    05/07/21 1141 05/07/21 1244 05/07/21 1620 05/08/21 0412 05/08/21 1715 05/09/21 0824  WBC 11.1* 10.5  --  9.1  --  9.4  HGB 16.9 14.8 16.0 13.1 13.9 13.7  HCT 50.7 44.6 47.0 39.4 41.0 43.2  PLT 270 276  --  283  --  188    COAGS: Recent Labs    05/07/21 1141  INR 1.1  APTT 34  BMP: Recent Labs    05/07/21 1141 05/07/21 1620 05/08/21 0412 05/08/21 1657 05/08/21 1715 05/09/21 0824  NA 136   < > 138 137 141 137  K 3.7   < > 3.3* 4.5 4.2 3.6  CL 103  --  112* 109  --  105  CO2 22  --  20* 19*  --  22  GLUCOSE 185*  --  115* 126*  --  146*  BUN 16  --  20 22*  --  25*  CALCIUM 9.0  --  7.5* 7.7*  --  7.9*  CREATININE 1.21  --  1.79* 2.19*  --  2.01*  GFRNONAA >60  --  44* 34*  --  38*   < > = values in this interval not displayed.    LIVER FUNCTION TESTS: Recent Labs    05/07/21 1141  BILITOT 0.6  AST 15  ALT 21  ALKPHOS 95  PROT 7.5  ALBUMIN 3.6    Assessment and Plan:  55 y.o. male inpatient. History of found to have an acute CVA s/p cerebral arteriogram with emergent mechanical thrombectomy of left MCA M2/M3 junction occulusion achieving occlusion achieving a TICI 2c revascularizing via right femoral approach  On 6.4.22 by Dr. Fatima Sanger.  MR brain from 6.6.22 reads Large, confluent acute posterior left MCA territory infarct with smaller acute infarcts scattered throughout the left frontotemporal lobes and left insular region. Associated edema and regional mass effect without midline shift. Areas of petechial hemorrhage along the posteromedial aspect of the confluent hemorrhage. Patient alert daughter and sister at bedside. Continued gobal aphasia. Unable to follow commands. Patient able to move all 4 extremities with right sided weakness. Mild facial droop noted.    IR will continue to follow along - plans per  Neurology.   Electronically Signed: Alene Mires, NP 05/10/2021, 11:32 AM   I spent a total of 15 Minutes at the the patient's bedside AND on the patient's hospital floor or unit, greater than 50% of which was counseling/coordinating care for CVA s/p revascularization.

## 2021-05-10 NOTE — Progress Notes (Signed)
Inpatient Diabetes Program Recommendations  AACE/ADA: New Consensus Statement on Inpatient Glycemic Control   Target Ranges:  Prepandial:   less than 140 mg/dL      Peak postprandial:   less than 180 mg/dL (1-2 hours)      Critically ill patients:  140 - 180 mg/dL  Results for SHAQUILLE, JANES (MRN [id number]) as of 05/10/2021 11:03  Ref. Range 05/09/2021 07:32 05/09/2021 11:17 05/09/2021 15:28 05/09/2021 19:29 05/09/2021 23:09 05/10/2021 03:18 05/10/2021 07:08  Glucose-Capillary Latest Ref Range: 70 - 99 mg/dL 993 (H) 716 (H) 967 (H) 211 (H) 153 (H) 164 (H) 184 (H)   Results for TAD, FANCHER (MRN [id number]) as of 05/10/2021 11:03  Ref. Range 05/08/2021 04:12  Hemoglobin A1C Latest Ref Range: 4.8 - 5.6 % 10.1 (H)   Review of Glycemic Control  Diabetes history: No Outpatient Diabetes medications: NA Current orders for Inpatient glycemic control: Novolog 0-15 units Q4H  Inpatient Diabetes Program Recommendations:    HbgA1C:  A1C 10.1% on 05/08/21 indicating an average glucose of 243 mg/dl over past 2-3 months. Per ADA, if A1C 6.5% or greater then mets criteria for DM dx. If patient is being newly dx with DM this admission, please inform patient and consult inpatient diabetes coordinator.  NOTE: Per chart, patient has no prior DM dx. Initial glucose 187 mg/dl on 1/0/25 and E5I 77.8% on 05/08/21. Patient admitted with CVA, noted to be aphasic, and PT/OT/ST recommending CIR. If attending provider will be diagnosing with new DM dx, please inform patient and nursing staff and also consult inpatient diabetes coordinator.   Thanks, Orlando Penner, RN, MSN, CDE Diabetes Coordinator Inpatient Diabetes Program [phone] (Team Pager from 8am to 5pm)

## 2021-05-10 NOTE — Progress Notes (Signed)
Inpatient Rehabilitation Admissions Coordinator  Inpatient rehab consult received. I met with patient with his sister and daughter at bedside as well as PT and OT. I discussed goals and expectations of a possible CIR admit. I have requested family provide me with his insurance information so that I can begin insurance Auth for a possible CIR admit once off Cleviprex.  He is a excellent candidate for Cir.  Danne Baxter, RN, MSN Rehab Admissions Coordinator [phone] 05/10/2021 11:38 AM

## 2021-05-10 NOTE — Progress Notes (Signed)
Occupational Therapy Treatment Patient Details Name: Peter Lucas MRN: [id number] DOB: August 19, 1966 Today's Date: 05/10/2021    History of present illness 55 yo male presenting 6/4 with dense aphasia. Imaging revealed L MCA infarct, and the pt is now s/p thrombectomy with revascularization with reocclusion. Attempted extubation 6/5, but required reintubation later that day due to hypoxia and worsening neuro exam. Pt with no significant PMH on file other than heavy tobacco use, but does not consistently access medical care.   OT comments  Pt progressing from bed to chair for grooming task this session. Pt with deficits with tooth brush use ( twirling instead of brushing). Pt with automatic greeting hello and bye verbalized. Pt smiling and turning to look at anyone talking appropriately. Pt Mod +2 (A) for basic transfer. Recommendation CIR at this time. Family reports x3 children and 5 other siblings to help with care.   Follow Up Recommendations  CIR    Equipment Recommendations  3 in 1 bedside commode    Recommendations for Other Services Rehab consult    Precautions / Restrictions Precautions Precautions: Fall Precaution Comments: SBP <180; globally aphasic       Mobility Bed Mobility Overal bed mobility: Needs Assistance Bed Mobility: Rolling;Supine to Sit Rolling: Min assist   Supine to sit: Min assist     General bed mobility comments: pt initiates transfer toward L side of bed but neglects R UE / LE. pt with tactile cues activating RLE with MOD (A) to progress off EOB. pt adjusting to eob sitting with weight shift automatically. Pt static sitting min guard (A)    Transfers                 General transfer comment: power up from chair with min (A) after multiple attempts and recognize task therapist are trying to initiate    Balance Overall balance assessment: Needs assistance Sitting-balance support: Single extremity supported;Feet supported Sitting  balance-Leahy Scale: Fair     Standing balance support: Single extremity supported;No upper extremity supported Standing balance-Leahy Scale: Fair Standing balance comment: posterior heel bias and needs slight tactile input for anterior weight shift                           ADL either performed or assessed with clinical judgement   ADL Overall ADL's : Needs assistance/impaired     Grooming: Maximal assistance;Sitting Grooming Details (indicate cue type and reason): pt putting brush in mouth and twirl instead of brushing does not apply paste even with visual cue. pt total (A) to don paste to brush. pt requires visual cue to open mouth and sustained                 Toilet Transfer: +2 for physical assistance;Moderate assistance Toilet Transfer Details (indicate cue type and reason): simulated OOB to chair   Toileting - Clothing Manipulation Details (indicate cue type and reason): incontinent of bladder without awareness     Functional mobility during ADLs: +2 for physical assistance;Moderate assistance General ADL Comments: Pt progressed to eob with visual cues     Vision   Additional Comments: R inattention, L gaze preference but will track with name call.   Perception     Praxis      Cognition Arousal/Alertness: Awake/alert Behavior During Therapy: WFL for tasks assessed/performed Overall Cognitive Status: Difficult to assess Area of Impairment: Following commands  Current Attention Level: Sustained   Following Commands: Follows one step commands inconsistently;Follows one step commands with increased time       General Comments: pt following visual cues and auditory name call. pt tracking therapist . pt with some R inattention. starting to verbalize automatic by saying bye as therapist leaving. pt smiling during session in response to task that didnt automatically generate that need for response.        Exercises  Exercises: Other exercises Other Exercises Other Exercises: attempts to activate R UE with visual attention tactile input and change of position-- no response noted   Shoulder Instructions       General Comments BP 175/95 on arrival supine, 162/91 (109) initial movmeent 170/98 (117) after mobility and sitting again    Pertinent Vitals/ Pain       Pain Assessment: No/denies pain  Home Living                                          Prior Functioning/Environment              Frequency  Min 2X/week        Progress Toward Goals  OT Goals(current goals can now be found in the care plan section)  Progress towards OT goals: Progressing toward goals  Acute Rehab OT Goals Patient Stated Goal: yeah ( primary statement) OT Goal Formulation: With patient/family Time For Goal Achievement: 05/23/21 Potential to Achieve Goals: Good ADL Goals Pt/caregiver will Perform Home Exercise Program: Increased strength;Right Upper extremity;With minimal assist;With written HEP provided Additional ADL Goal #1: pt will follow 1 step command 50% of session Additional ADL Goal #2: Pt will complete bed mobility mod (A) as precursor to adls. Additional ADL Goal #3: pt will complete basic transfer total +2 min (A) as precursor to adls.  Plan Discharge plan remains appropriate    Co-evaluation    PT/OT/SLP Co-Evaluation/Treatment: Yes Reason for Co-Treatment: Complexity of the patient's impairments (multi-system involvement);Necessary to address cognition/behavior during functional activity;For patient/therapist safety;To address functional/ADL transfers   OT goals addressed during session: ADL's and self-care;Proper use of Adaptive equipment and DME;Strengthening/ROM      AM-PAC OT "6 Clicks" Daily Activity     Outcome Measure   Help from another person eating meals?: A Lot Help from another person taking care of personal grooming?: A Lot Help from another person  toileting, which includes using toliet, bedpan, or urinal?: A Lot Help from another person bathing (including washing, rinsing, drying)?: A Lot Help from another person to put on and taking off regular upper body clothing?: A Lot Help from another person to put on and taking off regular lower body clothing?: A Lot 6 Click Score: 12    End of Session Equipment Utilized During Treatment: Gait belt  OT Visit Diagnosis: Unsteadiness on feet (R26.81);Muscle weakness (generalized) (M62.81);Hemiplegia and hemiparesis Hemiplegia - Right/Left: Right Hemiplegia - dominant/non-dominant: Dominant Hemiplegia - caused by: Cerebral infarction   Activity Tolerance Patient tolerated treatment well   Patient Left in chair;with call bell/phone within reach;with chair alarm set;with family/visitor present;with nursing/sitter in room   Nurse Communication Mobility status;Precautions        Time: 3536-1443 OT Time Calculation (min): 34 min  Charges: OT General Charges $OT Visit: 1 Visit OT Treatments $Self Care/Home Management : 8-22 mins   Brynn, OTR/L  Acute Rehabilitation Services Pager: [phone] Office: [phone] .  Mateo Flow 05/10/2021, 11:52 AM

## 2021-05-10 NOTE — Evaluation (Signed)
Speech Language Pathology Evaluation Patient Details Name: Peter Lucas MRN: [id number] DOB: 26-Jan-1966 Today's Date: 05/10/2021 Time: 5176-1607 SLP Time Calculation (min) (ACUTE ONLY): 24 min  Problem List:  Patient Active Problem List   Diagnosis Date Noted  . Stroke (HCC) 05/07/2021  . Acute ischemic left MCA stroke (HCC) 05/07/2021  . Middle cerebral artery embolism, left 05/07/2021   Past Medical History: History reviewed. No pertinent past medical history. Past Surgical History:  Past Surgical History:  Procedure Laterality Date  . BACK SURGERY     cyst removal  . IR CT HEAD LTD  05/07/2021  . IR PERCUTANEOUS ART THROMBECTOMY/INFUSION INTRACRANIAL INC DIAG ANGIO  05/07/2021  . RADIOLOGY WITH ANESTHESIA N/A 05/07/2021   Procedure: IR WITH ANESTHESIA;  Surgeon: Radiologist, Medication, MD;  Location: [facility] OR;  Service: Radiology;  Laterality: N/A;   HPI:  Pt is a 55 y/o male who presented on 6/4 with aphasia. 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 ETT 6/4-6/5; reintubated 6/5-6/6 worsening respiratory distress due to flash pulmonary edema. PMH: heavy tobacco use.   Assessment / Plan / Recommendation Clinical Impression  Pt demonstrates global aphasia scoring low on verbal comprehenstion, command following portions of bedside form WAB (Western Aphasia Battery) 1 out of 10 on yes/no, 0of 10 on sequential commands. He was unable to repeat words or phonemes, name common objects. Expression limited to one word "yeah" and phrase "I don't know" for all responses. Written information was not effective to respond to biographical question with field of 2 (0%). Repeated verbal and tactile cues counting with written cue pt perseverated on phoneme. Initiated humming to familiar country song.  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  Educated provided to family re: his abilities, limitations and ways to facilitate communication after assessment. ST to continue. Recommend CIR therapy.    SLP Assessment  SLP Recommendation/Assessment: Patient needs continued Speech Lanaguage Pathology Services SLP Visit Diagnosis: Cognitive communication deficit (R41.841);Aphasia (R47.01)    Follow Up Recommendations  Inpatient Rehab    Frequency and Duration min 2x/week  2 weeks      SLP Evaluation Cognition  Overall Cognitive Status: Impaired/Different from baseline Arousal/Alertness: Awake/alert Orientation Level:  (Did not comprehend y/n questions) Attention: Sustained Sustained Attention: Impaired Sustained Attention Impairment: Functional basic Memory:  (will assess as able and diagnostic tx) Awareness: Impaired Awareness Impairment: Emergent impairment Problem Solving: Impaired Problem Solving Impairment: Functional basic Safety/Judgment: Impaired       Comprehension  Auditory Comprehension Overall Auditory Comprehension: Impaired Yes/No Questions: Impaired Basic Biographical Questions: 0-25% accurate (10%) Basic Immediate Environment Questions: 0-24% accurate Commands: Impaired One Step Basic Commands: 0-24% accurate (10%) Visual Recognition/Discrimination Discrimination: Not tested Reading Comprehension Reading Status: Impaired Word level: Impaired    Expression Expression Primary Mode of Expression: Verbal Verbal Expression Overall Verbal Expression: Impaired Initiation: Impaired Level of Generative/Spontaneous Verbalization: Phrase Repetition: Impaired Level of Impairment: Word level Naming: Impairment Responsive:  (0%) Confrontation: Impaired (0%) Convergent: Not tested Verbal Errors:  ("yeah", "I don't know") Pragmatics: No impairment Written Expression Dominant Hand: Right Written Expression:  (TBA)   Oral / Motor  Oral Motor/Sensory Function Overall Oral Motor/Sensory Function: Mild  impairment Facial ROM: Reduced right;Suspected CN VII (facial) dysfunction Facial Symmetry: Abnormal symmetry right;Suspected CN VII (facial) dysfunction Facial Strength: Reduced right;Suspected CN VII (facial) dysfunction Motor Speech Overall Motor Speech: Appears within functional limits for tasks  assessed Respiration: Within functional limits Phonation: Normal Resonance: Within functional limits Articulation: Within functional limitis Intelligibility: Intelligible Motor Planning: Impaired Level of Impairment: Phrase Motor Speech Errors: Unaware   GO                    Royce Macadamia 05/10/2021, 3:03 PM  Breck Coons Lonell Face.Ed Nurse, children's [phone] Office [phone]
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
## 2021-05-10 NOTE — Progress Notes (Signed)
  Speech Language Pathology Treatment: Dysphagia  Patient Details Name: Peter Lucas MRN: [id number] DOB: September 25, 1966 Today's Date: 05/10/2021 Time: 0354-6568 SLP Time Calculation (min) (ACUTE ONLY): 24 min  Assessment / Plan / Recommendation Clinical Impression  [name] and daughter at bedside for speech-language-cognitive assessment and dysphagia treatment session. Yesterday he initiated Dys 3, nectar thick liquids as a result of clinical swallow evaluation. There were no outward concerns or indications he was having nectar thick OJ enter laryngeal vestibule purely from subjective point. Facial expressions demonstrated dislike and shook his head but was accepting of thick liquids. After pt demonstrated adequate oral mastication, transit without residue, upgraded texture to regular. Continue nectar thick liquids. Recommend an MBS to objectify swallow function for thin liquids (likely tomorrow).    HPI HPI: Pt is a 55 y/o male who presented on 6/4 with aphasia. 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 ETT 6/4-6/5; reintubated 6/5-6/6 worsening respiratory distress due to flash pulmonary edema. PMH: heavy tobacco use.      SLP Plan  MBS;Continue with current plan of care       Recommendations  Diet recommendations: Regular;Nectar-thick liquid Liquids provided via: Cup Medication Administration: Whole meds with puree Supervision: Staff to assist with self feeding;Full supervision/cueing for compensatory strategies Compensations: Slow rate;Small sips/bites;Minimize environmental distractions Postural Changes and/or Swallow Maneuvers: Seated upright 90 degrees                Oral Care Recommendations: Oral care BID Follow up Recommendations: Inpatient Rehab SLP Visit Diagnosis: Dysphagia, unspecified (R13.10) Plan: MBS;Continue with current  plan of care                       Royce Macadamia 05/10/2021, 2:16 PM  Breck Coons Lonell Face.Ed Nurse, children's [phone] Office [phone]

## 2021-05-10 NOTE — Progress Notes (Signed)
Physical Therapy Treatment Patient Details Name: Peter Lucas MRN: [id number] DOB: [birth year] Today's Date: 05/10/2021    History of Present Illness 55 yo male presenting 6/4 with dense aphasia. Imaging revealed L MCA infarct, and the pt is now s/p thrombectomy with revascularization with reocclusion. Attempted extubation 6/5, but required reintubation later that day due to hypoxia and worsening neuro exam. Pt with no significant PMH on file other than heavy tobacco use, but does not consistently access medical care.    PT Comments    Pt required min assist bed mobility, +2 mod assist transfers, and +2 HH/mod assist ambulation 7' forward and back. R inattention. Decreased strength R (UE > LE). Global aphasia. Pt answering "yeah" to most questions. Did answer "no" appropriately when asked "are you at [employer]?" and "do you work for [employer]?" Pt showing occasional frustration over word finding. He smiled throughout session and hugged therapist after transitioning to standing position. Systolic BP maintained below 180. Weaned from 5L to 2L at end of session with SpO2 > 90%. Pt in recliner at end of session. Family present in room.    Follow Up Recommendations  CIR     Equipment Recommendations   (defer to post acute)    Recommendations for Other Services       Precautions / Restrictions Precautions Precautions: Fall;Other (comment) Precaution Comments: SBP <180; globally aphasic    Mobility  Bed Mobility Overal bed mobility: Needs Assistance Bed Mobility: Rolling;Supine to Sit Rolling: Min assist   Supine to sit: Min assist     General bed mobility comments: multimodal cues for sequencing and to attend to R side, increased time    Transfers Overall transfer level: Needs assistance Equipment used: 2 person hand held assist Transfers: Sit to/from [employer] Sit to Stand: Mod assist;+2 physical assistance Stand pivot transfers: +2 physical  assistance;Mod assist       General transfer comment: assist to power up and stabilize balance, increased time for initiation  Ambulation/Gait Ambulation/Gait assistance: +2 physical assistance;Mod assist Gait Distance (Feet): 7 Feet (x 2, forward and back) Assistive device: 2 person hand held assist Gait Pattern/deviations: Decreased stride length;Step-through pattern;Decreased weight shift to right     General Gait Details: instability noted R knee but no overt buckling, continual assist with anterior translation of weight   Stairs             Wheelchair Mobility    Modified Rankin (Stroke Patients Only) Modified Rankin (Stroke Patients Only) Pre-Morbid Rankin Score: No symptoms Modified Rankin: Severe disability     Balance Overall balance assessment: Needs assistance Sitting-balance support: Single extremity supported;Feet supported Sitting balance-Leahy Scale: Fair     Standing balance support: Bilateral upper extremity supported;During functional activity Standing balance-Leahy Scale: Poor Standing balance comment: reliant on external support                            Cognition Arousal/Alertness: Awake/alert Behavior During Therapy: WFL for tasks assessed/performed Overall Cognitive Status: Difficult to assess Area of Impairment: Following commands;Attention                   Current Attention Level: Sustained   Following Commands: Follows one step commands inconsistently;Follows one step commands with increased time       General Comments: pt following visual cues and auditory name call. pt tracking therapist . pt with some R inattention. starting to verbalize automatic by saying bye as therapist leaving.  pt smiling during session in response to task that didnt automatically generate that need for response.      Exercises Other Exercises Other Exercises: attempts to activate R UE with visual attention tactile input and change of  position-- no response noted    General Comments General comments (skin integrity, edema, etc.): BP 175/95 supine and 170/98 in recliner. Pt initially on 5L O2. Weaned to 2L by end of session with SpO2 > 90%.      Pertinent Vitals/Pain Pain Assessment: Faces Faces Pain Scale: No hurt    Home Living                      Prior Function            PT Goals (current goals can now be found in the care plan section) Acute Rehab PT Goals Patient Stated Goal: unable to state Progress towards PT goals: Progressing toward goals    Frequency    Min 4X/week      PT Plan Current plan remains appropriate    Co-evaluation PT/OT/SLP Co-Evaluation/Treatment: Yes Reason for Co-Treatment: Complexity of the patient's impairments (multi-system involvement);For patient/therapist safety;To address functional/ADL transfers;Necessary to address cognition/behavior during functional activity PT goals addressed during session: Mobility/safety with mobility;Balance OT goals addressed during session: ADL's and self-care;Proper use of Adaptive equipment and DME;Strengthening/ROM      AM-PAC PT "6 Clicks" Mobility   Outcome Measure  Help needed turning from your back to your side while in a flat bed without using bedrails?: A Lot Help needed moving from lying on your back to sitting on the side of a flat bed without using bedrails?: A Lot Help needed moving to and from a bed to a chair (including a wheelchair)?: A Lot Help needed standing up from a chair using your arms (e.g., wheelchair or bedside chair)?: A Lot Help needed to walk in hospital room?: A Lot Help needed climbing 3-5 steps with a railing? : Total 6 Click Score: 11    End of Session Equipment Utilized During Treatment: Gait belt;Oxygen Activity Tolerance: Patient tolerated treatment well Patient left: in chair;with call bell/phone within reach;with chair alarm set;with family/visitor present Nurse Communication: Mobility  status PT Visit Diagnosis: Unsteadiness on feet (R26.81);Other abnormalities of gait and mobility (R26.89);Muscle weakness (generalized) (M62.81)     Time: 4098-1191 PT Time Calculation (min) (ACUTE ONLY): 36 min  Charges:  $Gait Training: 8-22 mins                     Aida Raider, PT  Office # [phone] Pager [phone]    Ilda Foil 05/10/2021, 12:47 PM

## 2021-05-11 ENCOUNTER — Inpatient Hospital Stay (HOSPITAL_COMMUNITY): Payer: BC Managed Care – PPO

## 2021-05-11 DIAGNOSIS — I10 Essential (primary) hypertension: Secondary | ICD-10-CM

## 2021-05-11 LAB — CULTURE, RESPIRATORY W GRAM STAIN: Culture: NORMAL

## 2021-05-11 LAB — GLUCOSE, CAPILLARY
Glucose-Capillary: 134 mg/dL — ABNORMAL HIGH (ref 70–99)
Glucose-Capillary: 142 mg/dL — ABNORMAL HIGH (ref 70–99)
Glucose-Capillary: 164 mg/dL — ABNORMAL HIGH (ref 70–99)
Glucose-Capillary: 181 mg/dL — ABNORMAL HIGH (ref 70–99)
Glucose-Capillary: 182 mg/dL — ABNORMAL HIGH (ref 70–99)
Glucose-Capillary: 190 mg/dL — ABNORMAL HIGH (ref 70–99)

## 2021-05-11 LAB — CBC
HCT: 42.5 % (ref 39.0–52.0)
Hemoglobin: 14.2 g/dL (ref 13.0–17.0)
MCH: 28 pg (ref 26.0–34.0)
MCHC: 33.4 g/dL (ref 30.0–36.0)
MCV: 83.7 fL (ref 80.0–100.0)
Platelets: 220 10*3/uL (ref 150–400)
RBC: 5.08 MIL/uL (ref 4.22–5.81)
RDW: 13.8 % (ref 11.5–15.5)
WBC: 9.3 10*3/uL (ref 4.0–10.5)
nRBC: 0 % (ref 0.0–0.2)

## 2021-05-11 LAB — BASIC METABOLIC PANEL
Anion gap: 14 (ref 5–15)
BUN: 28 mg/dL — ABNORMAL HIGH (ref 6–20)
CO2: 23 mmol/L (ref 22–32)
Calcium: 8.8 mg/dL — ABNORMAL LOW (ref 8.9–10.3)
Chloride: 101 mmol/L (ref 98–111)
Creatinine, Ser: 1.34 mg/dL — ABNORMAL HIGH (ref 0.61–1.24)
GFR, Estimated: >60 mL/min (ref >60)
Glucose, Bld: 163 mg/dL — ABNORMAL HIGH (ref 70–99)
Potassium: 3.2 mmol/L — ABNORMAL LOW (ref 3.5–5.1)
Sodium: 138 mmol/L (ref 135–145)

## 2021-05-11 LAB — PHOSPHORUS: Phosphorus: 3.6 mg/dL (ref 2.5–4.6)

## 2021-05-11 LAB — MAGNESIUM: Magnesium: 2 mg/dL (ref 1.7–2.4)

## 2021-05-11 MED ORDER — LORAZEPAM 0.5 MG PO TABS
0.5000 mg | ORAL_TABLET | Freq: Four times a day (QID) | ORAL | Status: DC | PRN
  Administered 2021-05-11: 0.5 mg via ORAL
  Filled 2021-05-11: qty 1

## 2021-05-11 MED ORDER — POTASSIUM CHLORIDE CRYS ER 20 MEQ PO TBCR
40.0000 meq | EXTENDED_RELEASE_TABLET | Freq: Three times a day (TID) | ORAL | Status: AC
  Administered 2021-05-11 – 2021-05-12 (×3): 40 meq via ORAL
  Filled 2021-05-11 (×4): qty 2

## 2021-05-11 MED ORDER — LISINOPRIL 20 MG PO TABS
20.0000 mg | ORAL_TABLET | Freq: Every day | ORAL | Status: DC
  Administered 2021-05-11 – 2021-05-12 (×2): 20 mg via ORAL
  Filled 2021-05-11 (×2): qty 1

## 2021-05-11 MED ORDER — LABETALOL HCL 5 MG/ML IV SOLN: 20.0000 mg | INTRAVENOUS | Status: DC | PRN

## 2021-05-11 MED ORDER — AMLODIPINE BESYLATE 10 MG PO TABS
10.0000 mg | ORAL_TABLET | Freq: Every day | ORAL | Status: DC
  Administered 2021-05-11 – 2021-05-12 (×2): 10 mg via ORAL
  Filled 2021-05-11: qty 1

## 2021-05-11 MED ORDER — LORAZEPAM 0.5 MG PO TABS: 0.5000 mg | ORAL_TABLET | ORAL | Status: DC | PRN

## 2021-05-11 MED ORDER — ENOXAPARIN SODIUM 60 MG/0.6ML IJ SOSY
60.0000 mg | PREFILLED_SYRINGE | INTRAMUSCULAR | Status: DC
  Administered 2021-05-12: 60 mg via SUBCUTANEOUS
  Filled 2021-05-11 (×2): qty 0.6

## 2021-05-11 MED ORDER — HYDRALAZINE HCL 20 MG/ML IJ SOLN
10.0000 mg | Freq: Four times a day (QID) | INTRAMUSCULAR | Status: DC | PRN
  Administered 2021-05-11 (×3): 20 mg via INTRAVENOUS
  Filled 2021-05-11 (×4): qty 1

## 2021-05-11 MED ORDER — SODIUM CHLORIDE 0.9 % IV SOLN
2.0000 g | INTRAVENOUS | Status: DC
  Administered 2021-05-11 – 2021-05-12 (×2): 2 g via INTRAVENOUS
  Filled 2021-05-11: qty 20
  Filled 2021-05-11: qty 2

## 2021-05-11 MED ORDER — MELATONIN 3 MG PO TABS
3.0000 mg | ORAL_TABLET | Freq: Every day | ORAL | Status: DC
  Administered 2021-05-12: 3 mg via ORAL
  Filled 2021-05-11: qty 1

## 2021-05-11 MED ORDER — AMLODIPINE BESYLATE 10 MG PO TABS
10.0000 mg | ORAL_TABLET | Freq: Every day | ORAL | Status: DC
  Filled 2021-05-11: qty 1

## 2021-05-11 MED ORDER — PANTOPRAZOLE SODIUM 40 MG PO TBEC
40.0000 mg | DELAYED_RELEASE_TABLET | Freq: Every day | ORAL | Status: DC
  Administered 2021-05-11 – 2021-05-12 (×2): 40 mg via ORAL
  Filled 2021-05-11 (×2): qty 1

## 2021-05-11 NOTE — Progress Notes (Addendum)
Inpatient Rehabilitation Admissions Coordinator  Daughter is assisting [place] with obtaining insurance ID number from his [place]. Unable to obtain yet, so unable to initiate insurance approval at this time. I have updated one son and a brother at bedside along with Dr. Pearlean Brownie.  Ottie Glazier, RN, MSN Rehab Admissions Coordinator [phone] 05/11/2021 10:19 AM   I have received ID number and therefore will begin insurance Auth with BCBS for a possible [facility] admit.  Ottie Glazier, RN, MSN Rehab Admissions Coordinator [phone] 05/11/2021 10:59 AM
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
## 2021-05-11 NOTE — Progress Notes (Signed)
Physical Therapy Treatment Patient Details Name: Peter Lucas MRN: [id number] DOB: 1966-06-18 Today's Date: 05/11/2021    History of Present Illness 55 yo male presenting 6/4 with dense aphasia. Imaging revealed L MCA infarct, and the pt is now s/p thrombectomy with revascularization with reocclusion. Attempted extubation 6/5, but required reintubation later that day due to hypoxia and worsening neuro exam. Pt with no significant PMH on file other than heavy tobacco use, but does not consistently access medical care.    PT Comments    Pt making excellent progress towards his physical therapy goals this session. Requiring two person minimal assist for functional mobility. Ambulating 100 ft, then 50 ft with bilateral handheld assist and chair follow. Desaturation to 87% on RA during mobility; rebounded to 90% with rest break. Pt continues with aphasia, but able to generate, "I don't know," and one "no" response today. Continues with right inattention, right sided weakness, decreased cognition, impaired communication. Continue to recommend comprehensive inpatient rehab (CIR) for post-acute therapy needs.    Follow Up Recommendations  CIR     Equipment Recommendations  3in1 (PT)    Recommendations for Other Services       Precautions / Restrictions Precautions Precautions: Fall;Other (comment) Precaution Comments: SBP <180; globally aphasic Restrictions Weight Bearing Restrictions: No    Mobility  Bed Mobility Overal bed mobility: Needs Assistance Bed Mobility: Supine to Sit     Supine to sit: Mod assist     General bed mobility comments: Pt not initiating, modA for BLE's to edge of bed and then pt able to manage trunk to upright.    Transfers Overall transfer level: Needs assistance Equipment used: 2 person hand held assist Transfers: Sit to/from Stand Sit to Stand: Min assist;+2 physical assistance         General transfer comment: MinA + 2 to power up and steady x  3. Decreased eccentric control with transition to sitting, also sitting pre-emptively on edge of bed when we re-entered the room  Ambulation/Gait Ambulation/Gait assistance: Min assist;+2 physical assistance Gait Distance (Feet): 150 Feet (100", 50") Assistive device: 2 person hand held assist Gait Pattern/deviations: Decreased stride length;Step-through pattern;Decreased weight shift to right;Decreased step length - right Gait velocity: decreased   General Gait Details: Pt ambulating 100 ft, then an additional 50 ft with a seated rest break in between. Pt requiring minA + 2 for balance, pt son following with chair.Noted right knee instability and decreased weight shift, but no buckle. Cues for stepping initiation, momentum, upward gaze.   Stairs             Wheelchair Mobility    Modified Rankin (Stroke Patients Only) Modified Rankin (Stroke Patients Only) Pre-Morbid Rankin Score: No symptoms Modified Rankin: Moderately severe disability     Balance Overall balance assessment: Needs assistance Sitting-balance support: Feet supported Sitting balance-Leahy Scale: Fair     Standing balance support: Bilateral upper extremity supported;During functional activity Standing balance-Leahy Scale: Poor Standing balance comment: reliant on HHA                            Cognition Arousal/Alertness: Awake/alert Behavior During Therapy: WFL for tasks assessed/performed Overall Cognitive Status: Impaired/Different from baseline Area of Impairment: Following commands;Attention;Safety/judgement;Problem solving                   Current Attention Level: Sustained   Following Commands: Follows one step commands inconsistently;Follows one step commands with increased time Safety/Judgement: Decreased  awareness of deficits;Decreased awareness of safety   Problem Solving: Requires verbal cues;Decreased initiation General Comments: Pt generating majority "yes,"  responses incorrectly i.e. is your name Brynda Greathouse? pt responds "yeah." Pt did respond "no" to are you having any pain? Pt unable to identify objects, sighing and stating, "I don't know." Decreased initiation for sitting up on side of bed      Exercises      General Comments        Pertinent Vitals/Pain Pain Assessment: No/denies pain    Home Living                      Prior Function            PT Goals (current goals can now be found in the care plan section) Acute Rehab PT Goals Patient Stated Goal: unable to state Potential to Achieve Goals: Good Progress towards PT goals: Progressing toward goals    Frequency    Min 4X/week      PT Plan Current plan remains appropriate    Co-evaluation              AM-PAC PT "6 Clicks" Mobility   Outcome Measure  Help needed turning from your back to your side while in a flat bed without using bedrails?: A Lot Help needed moving from lying on your back to sitting on the side of a flat bed without using bedrails?: A Lot Help needed moving to and from a bed to a chair (including a wheelchair)?: A Little Help needed standing up from a chair using your arms (e.g., wheelchair or bedside chair)?: A Little Help needed to walk in hospital room?: A Little Help needed climbing 3-5 steps with a railing? : A Lot 6 Click Score: 15    End of Session Equipment Utilized During Treatment: Gait belt Activity Tolerance: Patient tolerated treatment well Patient left: in chair;with call bell/phone within reach;with chair alarm set;with family/visitor present Nurse Communication: Mobility status PT Visit Diagnosis: Unsteadiness on feet (R26.81);Other abnormalities of gait and mobility (R26.89);Muscle weakness (generalized) (M62.81)     Time: 8563-1497 PT Time Calculation (min) (ACUTE ONLY): 23 min  Charges:  $Gait Training: 8-22 mins $Therapeutic Activity: 8-22 mins                     Lillia Pauls, PT, DPT Acute  Rehabilitation Services Pager [phone] Office [phone]    Norval Morton 05/11/2021, 11:31 AM

## 2021-05-11 NOTE — Progress Notes (Signed)
SLP Cancellation Note  Patient Details Name: Peter Lucas MRN: [id number] DOB: Jan 01, 1966   Cancelled treatment:       Reason Eval/Treat Not Completed: Medical issues which prohibited therapy. BP too high to travel to Radiology this pm per RN. Will f/u   Shelsea Hangartner, Riley Nearing 05/11/2021, 1:14 PM

## 2021-05-11 NOTE — H&P (Signed)
Physical Medicine and Rehabilitation Admission H&P    Chief Complaint  Patient presents with   Code Stroke  : HPI: Peter Lucas is a 55 year old right-handed male with unremarkable past medical history who has not seen a PCP for many years as well as history of tobacco use, obesity with BMI 34.28.  He is on no prescription medications.  Per chart review patient lives alone.  1 level home 3 steps to entry.  Independent prior to admission working at [employer].  He has good family support with multiple family members in the area.  Presented 05/07/2021 with acute onset of aphasia and right side weakness.  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  No hemorrhagic transformation.  Patient did not receive tPA.  CTA of head and neck positive for occlusion of the posterior left MCA branch, distal M2 versus proximal M3.  Patient underwent thrombectomy per interventional radiology.  Latest cranial CT scan showed more pronounced low density within the left MCA branch vessel infarction.  No midline shift or hemorrhage.  Echocardiogram ejection fraction of 55 to 60% no wall motion abnormalities.  Admission chemistries unremarkable except glucose 185 alcohol negative WBC 11,100.  Patient did require initial intubation after thrombectomy was ultimately extubated but became hypoxic requiring reintubation with final extubation 05/09/2021.  He did spike a low-grade fever respiratory cultures grew gram-positive cocci in pairs and chains currently maintained on Rocephin 05/10/2021 x5 to 7 days.  Currently maintained on aspirin as well as Plavix for CVA prophylaxis.  Subcutaneous Lovenox for DVT prophylaxis.  Close monitoring of blood pressure initially on Cleviprex and since discontinued.  Findings of elevated hemoglobin A1c 10.1 with sliding scale insulin added. .  Patient was advanced to a regular consistency diet.  Therapy evaluations completed due to patient's  decreased functional mobility and aphasia was admitted for a comprehensive rehab program.  Pt's family able to say pt didn't have PCP- and is on regular diet now.  "Speaking fine" but doesn't understand/respond correctly.  Pt has a condom catheter per nursing- not clear when had LBM.   Review of Systems  Constitutional:  Negative for chills and fever.  HENT:  Negative for hearing loss.   Eyes:  Negative for blurred vision.  Respiratory:  Negative for cough and shortness of breath.   Cardiovascular:  Negative for chest pain, palpitations and leg swelling.  Gastrointestinal:  Positive for constipation. Negative for heartburn, nausea and vomiting.  Genitourinary:  Negative for dysuria and flank pain.  Musculoskeletal:  Positive for myalgias.  Skin:  Negative for rash.  Neurological:  Positive for speech change, weakness and headaches.  All other systems reviewed and are negative. History reviewed. No pertinent past medical history. Past Surgical History:  Procedure Laterality Date   BACK SURGERY     cyst removal   IR CT HEAD LTD  05/07/2021   IR PERCUTANEOUS ART THROMBECTOMY/INFUSION INTRACRANIAL INC DIAG ANGIO  05/07/2021   RADIOLOGY WITH ANESTHESIA N/A 05/07/2021   Procedure: IR WITH ANESTHESIA;  Surgeon: Radiologist, Medication, MD;  Location: [facility] OR;  Service: Radiology;  Laterality: N/A;   History reviewed. No pertinent family history. Social History:  reports that he has been smoking. He does not have any smokeless tobacco history on file. No history on file for alcohol use and drug use. Allergies: No Known Allergies No medications prior to admission.    Drug Regimen Review Drug regimen was reviewed and remains appropriate with no significant issues identified  Home: Home Living Family/patient expects to be discharged to:: Private residence Living Arrangements: Alone Available Help at Discharge: Family, Available 24 hours/day (children and siblings would provide 24/7  supervision) Type of Home: House Home Access: Stairs to enter Secretary/administrator of Steps: 3 Home Layout: One level Bathroom Shower/Tub: Health visitor: Pharmacist, community: Yes Home Equipment: None  Lives With: Alone   Functional History: Prior Function Level of Independence: Independent Comments: works for [employer] Status:  Mobility: Bed Mobility Overal bed mobility: Needs Assistance Bed Mobility: Supine to Sit Rolling: Min assist Sidelying to sit: Mod assist, +2 for physical assistance Supine to sit: Mod assist Sit to supine: +2 for physical assistance, Mod assist Sit to sidelying: Mod assist, +2 for physical assistance General bed mobility comments: Pt not initiating, modA for BLE's to edge of bed and then pt able to manage trunk to upright. Transfers Overall transfer level: Needs assistance Equipment used: 2 person hand held assist Transfers: Sit to/from Stand Sit to Stand: Min assist, +2 physical assistance Stand pivot transfers: +2 physical assistance, Mod assist General transfer comment: MinA + 2 to power up and steady x 3. Decreased eccentric control with transition to sitting, also sitting pre-emptively on edge of bed when we re-entered the room Ambulation/Gait Ambulation/Gait assistance: Min assist, +2 physical assistance Gait Distance (Feet): 150 Feet (100", 50") Assistive device: 2 person hand held assist Gait Pattern/deviations: Decreased stride length, Step-through pattern, Decreased weight shift to right, Decreased step length - right General Gait Details: Pt ambulating 100 ft, then an additional 50 ft with a seated rest break in between. Pt requiring minA + 2 for balance, pt son following with chair.Noted right knee instability and decreased weight shift, but no buckle. Cues for stepping initiation, momentum, upward gaze. Gait velocity: decreased    ADL: ADL Overall ADL's : Needs assistance/impaired Grooming:  Maximal assistance, Sitting Grooming Details (indicate cue type and reason): pt putting brush in mouth and twirl instead of brushing does not apply paste even with visual cue. pt total (A) to don paste to brush. pt requires visual cue to open mouth and sustained Toilet Transfer: +2 for physical assistance, Moderate assistance Toilet Transfer Details (indicate cue type and reason): simulated OOB to chair Toileting - Clothing Manipulation Details (indicate cue type and reason): incontinent of bladder without awareness Functional mobility during ADLs: +2 for physical assistance, Moderate assistance General ADL Comments: Pt progressed to eob with visual cues  Cognition: Cognition Overall Cognitive Status: Impaired/Different from baseline Arousal/Alertness: Awake/alert Orientation Level: Oriented X4 Attention: Sustained Sustained Attention: Impaired Sustained Attention Impairment: Functional basic Memory:  (will assess as able and diagnostic tx) Awareness: Impaired Awareness Impairment: Emergent impairment Problem Solving: Impaired Problem Solving Impairment: Functional basic Safety/Judgment: Impaired Cognition Arousal/Alertness: Awake/alert Behavior During Therapy: WFL for tasks assessed/performed Overall Cognitive Status: Impaired/Different from baseline Area of Impairment: Following commands, Attention, Safety/judgement, Problem solving Current Attention Level: Sustained Following Commands: Follows one step commands inconsistently, Follows one step commands with increased time Safety/Judgement: Decreased awareness of deficits, Decreased awareness of safety Awareness:  (no verbalizations) Problem Solving: Requires verbal cues, Decreased initiation General Comments: Pt generating majority "yes," responses incorrectly i.e. is your name Brynda Greathouse? pt responds "yeah." Pt did respond "no" to are you having any pain? Pt unable to identify objects, sighing and stating, "I don't know." Decreased  initiation for sitting up on side of bed Difficult to assess due to: Impaired communication  Physical Exam: Blood pressure (!) 149/93, pulse 63, temperature 98.6  F (37 C), temperature source Oral, resp. rate 19, height  (1.905 m), weight 124.4 kg, SpO2 96 %. Physical Exam Vitals and nursing note reviewed. Exam conducted with a chaperone present.  Constitutional:      Comments: Daughter and wife sitting bedside; pt sitting up in bed; large gentleman- nears tears when kept saying "I don't know" to every question, NAD  HENT:     Head: Normocephalic and atraumatic.     Comments: R facial droop- couldn't understand to stick tongue out- aphasia interfering with exam    Right Ear: External ear normal.     Left Ear: External ear normal.     Nose: Nose normal. No congestion.     Mouth/Throat:     Mouth: Mucous membranes are dry.     Pharynx: Oropharynx is clear. No oropharyngeal exudate.  Eyes:     General:        Right eye: No discharge.        Left eye: No discharge.     Comments: EOM grossly intact- couldn't comply with exam- no nystagmus seen  Cardiovascular:     Rate and Rhythm: Normal rate and regular rhythm.     Heart sounds: Normal heart sounds. No murmur heard.   No gallop.  Pulmonary:     Comments: CTA B/L- no W/R/R- good air movement   Abdominal:     Comments: Protuberant; soft, NT, distended?; hypoactive  Genitourinary:    Comments: Condom catheter in place- medium amber urine Musculoskeletal:     Cervical back: Normal range of motion and neck supple.     Comments: Hard to assess due to inability to do exam/aphasia LUE and LLE appear 5/5 but cannot test some muscles RUE- triceps 4/5, grip 3-/5, finger abd 0/5- couldn't appear to understand bicep RLE- at least 4/5 in HF, DF and PF- couldn't understand KE/KF  Skin:    General: Skin is warm and dry.     Comments: R hand IV- getting IV ABX No skin breakdown otherwise- seen  Neurological:     Comments: Patient is  alert.  Makes eye contact with examiner.  Globally aphasic.  Follows some inconsistent commands. Pt is SEVERELY receptively aphasic- mild to  moderate expressive aphasia Not able to name anything- said I' don't know' including wife, daughter, pen. Cannot repeat anything But spontaneously will comment appropriately- something is cold, etc.  Unable to assess sensation due to aphasia.    Psychiatric:     Comments: tearful    Results for orders placed or performed during the hospital encounter of 05/07/21 (from the past 48 hour(s))  Glucose, capillary     Status: Abnormal   Collection Time: 05/10/21  7:08 AM  Result Value Ref Range   Glucose-Capillary 184 (H) 70 - 99 mg/dL    Comment: Glucose reference range applies only to samples taken after fasting for at least 8 hours.  Glucose, capillary     Status: Abnormal   Collection Time: 05/10/21 11:19 AM  Result Value Ref Range   Glucose-Capillary 174 (H) 70 - 99 mg/dL    Comment: Glucose reference range applies only to samples taken after fasting for at least 8 hours.  Triglycerides     Status: Abnormal   Collection Time: 05/10/21 11:51 AM  Result Value Ref Range   Triglycerides 200 (H) <150 mg/dL    Comment: Performed at [facility], [address], [place], [place] [address]  Glucose, capillary     Status: Abnormal  Collection Time: 05/10/21  4:08 PM  Result Value Ref Range   Glucose-Capillary 180 (H) 70 - 99 mg/dL    Comment: Glucose reference range applies only to samples taken after fasting for at least 8 hours.  Glucose, capillary     Status: Abnormal   Collection Time: 05/10/21  7:25 PM  Result Value Ref Range   Glucose-Capillary 200 (H) 70 - 99 mg/dL    Comment: Glucose reference range applies only to samples taken after fasting for at least 8 hours.  Glucose, capillary     Status: Abnormal   Collection Time: 05/10/21 11:14 PM  Result Value Ref Range   Glucose-Capillary 188 (H) 70 - 99 mg/dL    Comment: Glucose  reference range applies only to samples taken after fasting for at least 8 hours.  Glucose, capillary     Status: Abnormal   Collection Time: 05/11/21  3:19 AM  Result Value Ref Range   Glucose-Capillary 164 (H) 70 - 99 mg/dL    Comment: Glucose reference range applies only to samples taken after fasting for at least 8 hours.  CBC     Status: None   Collection Time: 05/11/21  5:58 AM  Result Value Ref Range   WBC 9.3 4.0 - 10.5 K/uL   RBC 5.08 4.22 - 5.81 MIL/uL   Hemoglobin 14.2 13.0 - 17.0 g/dL   HCT 96.042.5 45.439.0 - 09.852.0 %   MCV 83.7 80.0 - 100.0 fL   MCH 28.0 26.0 - 34.0 pg   MCHC 33.4 30.0 - 36.0 g/dL   RDW 11.913.8 14.711.5 - 82.915.5 %   Platelets 220 150 - 400 K/uL   nRBC 0.0 0.0 - 0.2 %    Comment: Performed at [facility], [address] [place], [place] [address]  Basic metabolic panel     Status: Abnormal   Collection Time: 05/11/21  5:58 AM  Result Value Ref Range   Sodium 138 135 - 145 mmol/L   Potassium 3.2 (L) 3.5 - 5.1 mmol/L   Chloride 101 98 - 111 mmol/L   CO2 23 22 - 32 mmol/L   Glucose, Bld 163 (H) 70 - 99 mg/dL    Comment: Glucose reference range applies only to samples taken after fasting for at least 8 hours.   BUN 28 (H) 6 - 20 mg/dL   Creatinine, Ser 0.861.34 (H) 0.61 - 1.24 mg/dL   Calcium 8.8 (L) 8.9 - 10.3 mg/dL   GFR, Estimated >57>60 >84>60 mL/min    Comment: (NOTE) Calculated using the CKD-EPI Creatinine Equation (2021)    Anion gap 14 5 - 15    Comment: Performed at [facility], [address] [place], [place] [address]  Magnesium     Status: None   Collection Time: 05/11/21  5:58 AM  Result Value Ref Range   Magnesium 2.0 1.7 - 2.4 mg/dL    Comment: Performed at [facility], [address] [place], [place] [address]  Phosphorus     Status: None   Collection Time: 05/11/21  5:58 AM  Result Value Ref Range   Phosphorus 3.6 2.5 - 4.6 mg/dL    Comment: Performed at [facility], [address] [place], [place] [address]  Glucose,  capillary     Status: Abnormal   Collection Time: 05/11/21  7:27 AM  Result Value Ref Range   Glucose-Capillary 182 (H) 70 - 99 mg/dL    Comment: Glucose reference range applies only to samples  taken after fasting for at least 8 hours.  Glucose, capillary     Status: Abnormal   Collection Time: 05/11/21 11:10 AM  Result Value Ref Range   Glucose-Capillary 190 (H) 70 - 99 mg/dL    Comment: Glucose reference range applies only to samples taken after fasting for at least 8 hours.  Glucose, capillary     Status: Abnormal   Collection Time: 05/11/21  4:25 PM  Result Value Ref Range   Glucose-Capillary 134 (H) 70 - 99 mg/dL    Comment: Glucose reference range applies only to samples taken after fasting for at least 8 hours.  Glucose, capillary     Status: Abnormal   Collection Time: 05/11/21  7:55 PM  Result Value Ref Range   Glucose-Capillary 142 (H) 70 - 99 mg/dL    Comment: Glucose reference range applies only to samples taken after fasting for at least 8 hours.   Comment 1 Notify RN    Comment 2 Document in Chart   Glucose, capillary     Status: Abnormal   Collection Time: 05/11/21 11:50 PM  Result Value Ref Range   Glucose-Capillary 181 (H) 70 - 99 mg/dL    Comment: Glucose reference range applies only to samples taken after fasting for at least 8 hours.   Comment 1 Notify RN    Comment 2 Document in Chart   Glucose, capillary     Status: Abnormal   Collection Time: 05/12/21  4:13 AM  Result Value Ref Range   Glucose-Capillary 177 (H) 70 - 99 mg/dL    Comment: Glucose reference range applies only to samples taken after fasting for at least 8 hours.   Comment 1 Notify RN    Comment 2 Document in Chart    No results found.      Medical Problem List and Plan: 1.  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  Status post thrombectomy.  -patient may shower  -ELOS/Goals: 2-3 weeks-  supervision to min A 2.  Antithrombotics: -DVT/anticoagulation: Lovenox  -antiplatelet therapy: Aspirin 81 mg daily and Plavix 75 mg daily x90 days then aspirin alone beginning 08/08/2021 3. Pain Management: Tylenol as needed 4. Mood: Ativan 0.5 mg every 4 hours as needed anxiety  -antipsychotic agents: N/A 5. Neuropsych: This patient is not capable of making decisions on his own behalf. 6. Skin/Wound Care: Routine skin checks 7. Fluids/Electrolytes/Nutrition: Routine in and outs with follow-up chemistries 8.  Dysphagia.  Carb modified nectar thick liquid.  And advance to a regular consistency 05/13/2021 follow-up speech therapy 9.  Acute respiratory failure with hypoxia and hypercapnia.  Gram-positive cocci pneumonia.  Ceftriaxone 05/10/2021 x 5 to 7 days.  Titrate oxygen as needed. 10.  New findings diabetes mellitus.  Hemoglobin A1c 10.1.  Currently on SSI.  Diabetic teaching 11.  Hypertension.  HCTZ 25 mg daily, clonidine 0.2 mg every 8 hours, Norvasc 10 mg daily, lisinopril 20 mg daily.  Monitor with increased mobility 12.  Hyperlipidemia.  Lipitor 13.  Obesity.  BMI 34.28.  Dietary follow-up 14.  Tobacco abuse.  NicoDerm patch.  Provide counseling 15. HAS NO PCP!  Mcarthur Rossetti Angiulli, PA-C 05/12/2021  I have personally performed a face to face diagnostic evaluation of this patient and formulated the key components of the plan.  Additionally, I have personally reviewed laboratory data, imaging studies, as well as relevant notes and concur with the physician assistant's documentation  above.

## 2021-05-11 NOTE — Progress Notes (Addendum)
INTERVAL HISTORY No acute events overnight. Son at bedside. Remains aphasic but is cooperative with examiner on exam. Has come off Cleviprex and is on oral BP regimen.  Speech therapy plan to do modified barium swallow later today.  Neurological exam is unchanged.  Vital signs are stable Vitals:   05/11/21 1135 05/11/21 1200 05/11/21 1215 05/11/21 1228  BP: (!) 187/118 (!) 188/110 (!) 186/100 (!) 179/94  Pulse:  61 62 63  Resp:  (!) 22 (!) 22 (!) 27  Temp:  98.3 F (36.8 C)    TempSrc:  Axillary    SpO2:  91% 93% (!) 87%  Weight:      Height:       CBC:  Recent Labs  Lab 05/07/21 1141 05/07/21 1244 05/08/21 0412 05/08/21 1715 05/09/21 0824 05/11/21 0558  WBC 11.1*   < > 9.1  --  9.4 9.3  NEUTROABS 8.9*  --  6.3  --   --   --   HGB 16.9   < > 13.1   < > 13.7 14.2  HCT 50.7   < > 39.4   < > 43.2 42.5  MCV 84.2   < > 85.3  --  88.2 83.7  PLT 270   < > 283  --  188 220   < > = values in this interval not displayed.   Basic Metabolic Panel:  Recent Labs  Lab 05/08/21 0912 05/08/21 1657 05/09/21 0824 05/11/21 0558  NA  --    < > 137 138  K  --    < > 3.6 3.2*  CL  --    < > 105 101  CO2  --    < > 22 23  GLUCOSE  --    < > 146* 163*  BUN  --    < > 25* 28*  CREATININE  --    < > 2.01* 1.34*  CALCIUM  --    < > 7.9* 8.8*  MG 1.9  --   --  2.0  PHOS  --   --   --  3.6   < > = values in this interval not displayed.   Lipid Panel:  Recent Labs  Lab 05/08/21 0412 05/09/21 0824 05/10/21 1151  CHOL 135  --   --   TRIG 144   < > 200*  HDL 20*  --   --   CHOLHDL 6.8  --   --   VLDL 29  --   --   LDLCALC 86  --   --    < > = values in this interval not displayed.   HgbA1c:  Recent Labs  Lab 05/08/21 0412  HGBA1C 10.1*   Urine Drug Screen:  Recent Labs  Lab 05/07/21 1704  LABOPIA NONE DETECTED  COCAINSCRNUR NONE DETECTED  LABBENZ NONE DETECTED  AMPHETMU NONE DETECTED  THCU NONE DETECTED  LABBARB NONE DETECTED    Alcohol Level  Recent Labs  Lab  05/07/21 1141  ETH <10    PHYSICAL EXAM GENERAL: Obese middle-aged Caucasian male sitting in chair at bedside  HEENT: Head is atraumatic, normocephalic. Eyes: PERRL.  LUNGS: deep breathing sounds, just extubated CARDIOVASCULAR: Regular rate and rhythm  ABDOMEN: Bowel sounds present.  Ext : normal pulse distally  NEUROLOGICAL EXAM MS: Alert, can not answer LOC questions due to aphasia. States yes repetitively. For example when asked if son is by him states yes and when asked "is that your daughter" also states yes. Does  not follow commands ( such as two fingers , thumbs up but is cooperative  CN 2-12: PERRL, Blinks to visual threatening BL, EOM seem intact, but difficult to test d/t not following commands. Mild R facial asymmetry, no facial sensory deficits Global aphasia. Left gaze deviation.  Does not blink to threat on the right. Motor: Normal bulk and tone. LUE + LLE antigravity. RUE antigravity proximally, is weaker distally with right wrist drop. RLE antigravity with drift. Strength unable to be formally assessed due to aphasia.  Sensory:  No focal sensory loss noted in limited exam Coordination: Unable to assess Gait was not tested considering pt's condition  ASSESSMENT/PLAN 1. Acute/subacute ischemic stroke within the L Parietotemporal area in the setting of distal LM2 occlusion. ASPECT 7. S/P IR thrombectomy initial with TICI2c with reoccluded vessel.   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 However, cardioemblic event cannot be completely ruled out. Of note, family reported what seems to be an episode of Amaurosis fugax last month they think it was the R eye.     Vascular risk factors: Smoking, uncontrolled DM2, hga1c 10.,  doesn't follow with PCP.   CT head  Posterior left MCA infarct with fairly well-developed cytotoxic edema. ASPECTS 7. CTA head & neck occlusion of a posterior Left MCA branch, distal M2 versus proximal M3. 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 Moderately severe stenosis of R P1/P2 and L pericallosal branch.  CT perfusion posterior left MCA territory infarct core of 42 mL and 70 mL estimated penumbra by CTP (mismatch of 28 mL)  Repeated CT head with petechial hemorrhage involving a small adjacent cortical infarct involving the left temporal operculum. Stable small subarachnoid hemorrhage in the region of the a cortical infarct as well as the left parietal lobe.  Repeated CT head this afternoon showed evolving L parietotemporal infarct with less pronounced hyperdensity within the sylvian fissure most likely contrast staining.    6/6 MRI Brain with large left MCA territory stroke with associated edema and regional mass effect without midline shift. Areas of petechial hemorrhage along the posteromedial aspect of the confluent hemorrhage.   6/5 Echo w/ EF 55 to 60 %, no atrial level shunt, left atrium normal in size    LDL 86  HgbA1c 10.1  Lovenox for VTE prophylaxis Diet Order            Diet heart healthy/carb modified Room service appropriate? Yes with Assist; Fluid consistency: Nectar Thick  Diet effective now                 No antithrombotics prior to admission, Aspirin 81 mg initiated this admission   Therapy recommendations: CLR   Disposition:  Pending  Hypertension  Home meds: None  . Mainatin SBP <160 for now.  . Current BP regimen: Amlodipine 10 mg QD + Lisinopril 20 mg QD + Clonidine 0.2 Q8  . IV PRN Hydralazine and Labetalol for SBP > 160  . Long-term BP goal <140/90  Hyperlipidemia  Home meds:  None   LDL 86, goal < 70  High intensity statin Lipitor 40 mg qhs started this admission    Hyperglycemia:   Home meds: None   HgbA1c 10.1, goal < 7.0  CBGs Recent Labs    05/11/21 0319 05/11/21 0727 05/11/21 1110  GLUCAP 164* 182* 190*      SSI  Other Stroke Risk Factors   Cigarette smoker : Needs counseling.   Obesity, Body mass index is 34.28 kg/m., BMI >/=  30  associated with increased stroke risk, recommend weight loss, diet and exercise as appropriate   Suspected Obstructive sleep apnea, consider sleep study as an outpt.   AKI Critical care team on board Stritct I/O Consider Nephro consult  Respiratory failure Extubated twice on 6/5 and 6/6. Stable since 6/6 extubation  ICU team on board.   Hypokalemia -replaced - Monitor   Inadequate PO intake - SLP following  - Was extubated twice, last on 6/6 has done fine since then and SLP started him on a heart healthy diet. Monitoring PO intake   Stark Jock, NP  [facility] Nurse Practitioner Patient seen and discussed with attending physician [facility]. [facility] day # 4  Patient is neurologically stable but still significantly aphasic.  Speech therapy to check modified barium swallow later today continue aspirin and Plavix for 3 months given intracranial atherosclerosis which likely cause of stroke.  Mobilize out of bed.  Continue ongoing therapies.  Transfer to neurology floor bed.  Will likely need inpatient rehab.  Long discussion with the son at the bedside and answered questions.  Discussed with speech therapy This patient is critically ill and at significant risk of neurological worsening, death and care requires constant monitoring of vital signs, hemodynamics,respiratory and cardiac monitoring, extensive review of multiple databases, frequent neurological assessment, discussion with family, other specialists and medical decision making of high complexity.I have made any additions or clarifications directly to the above note.This critical care time does not reflect procedure time, or teaching time or supervisory time of PA/NP/Med Resident etc but could involve care discussion time.  I spent 30 minutes of neurocritical care time  in the care of  this patient.  Delia Heady, MD  To contact Stroke Continuity provider, please refer to [email or web address]. After hours, contact  General Neurology

## 2021-05-11 NOTE — PMR Pre-admission (Signed)
PMR Admission Coordinator Pre-Admission Assessment  Patient: Peter Lucas is an 55 y.o., male MRN: [id number] DOB: [birth year] Height: _0  (190.5 cm) Weight: 124.4 kg (bed weight)  Insurance Information HMO:     PPO: yes     PCP:      IPA:      80/20:      OTHER:  PRIMARY: [employer]      Policy#: [id number]      Subscriber: pt CM Name: Cassandra      Phone#: [phone] ext [phone]     Fax#: [phone] Pre-Cert#: [id number] for 7 days updates due 6/15 with Heidi Dach phone [phone] ext [phone] fax [phone]     Employer: [employer] Benefits:  Phone #: [phone]     Name: 6/8 Eff. Date: 12/04/2020     Deduct: $1400      Out of Pocket Max: $4000      Life Max: none CIR: 90%      SNF: 90%  100 days per year Outpatient:  90%    Co-Pay: 60 visits combined Home Health: 90%      Co-Pay: per medical neccesity DME: 90%     Co-Pay: 10% Providers: in network  SECONDARY: none  Financial Counselor:       Phone#:   The Engineer, [employer]" for patients in Inpatient Rehabilitation Facilities with attached "Privacy Act [place] Records" was provided and verbally reviewed with: Family  Emergency Contact Information Contact Information     Name Relation Home Work [place], [name] Daughter   [place], [name] Son   [phone]   [name], [name]   [phone]       Current Medical History  Patient Admitting Diagnosis: CVA  History of Present Illness:55 year old right-handed male with unremarkable past medical history who has not seen a PCP for many years as well as history of tobacco use, obesity with BMI 34.28.  He is on no prescription medications.    Presented 05/07/2021 with acute onset of aphasia and right side weakness.  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  No hemorrhagic transformation.  Patient did not receive TPA.  CTA of head and neck positive  for occlusion of the posterior left MCA branch, distal M2 versus proximal M3.  Patient underwent thrombectomy per interventional radiology.  Latest cranial CT scan showed more pronounced low density within the left MCA branch vessel infarction.  No midline shift or hemorrhage.  Echocardiogram ejection fraction of 55 to 60% no wall motion abnormalities.  Admission chemistries unremarkable except glucose 185 alcohol negative WBC 11,100.  Patient did require initial intubation after thrombectomy was ultimately extubated but became hypoxic requiring reintubation with final extubation 05/09/2021.  He did spike a low-grade fever respiratory cultures grew gram-positive cocci in pairs and chains currently maintained on Rocephin 05/10/2021 x 5 to 7 days.  Currently maintained on aspirin as well as Plavix for CVA prophylaxis.  Subcutaneous Lovenox for DVT prophylaxis.  Close monitoring of blood pressure initially on Cleviprex.  Findings of elevated hemoglobin A1c 10.1 with sliding scale insulin added.  MBS 6/9 with diet upgraded to Regular diet with thin liquids.  Complete NIHSS TOTAL: 12  Patient's medical record from [facility] has been reviewed by the rehabilitation admission coordinator and physician.  Past Medical History  History reviewed. No pertinent past medical history.  Family History   family history is not on file.  Prior Rehab/Hospitalizations Has  the patient had prior rehab or hospitalizations prior to admission? Yes  Has the patient had major surgery during 100 days prior to admission? Yes   Current Medications  Current Facility-Administered Medications:    0.9 %  sodium chloride infusion, 250 mL, Intravenous, Continuous, Bowser, Laurel Dimmer, NP   acetaminophen (TYLENOL) tablet 650 mg, 650 mg, Oral, Q4H PRN, 650 mg at 05/11/21 1849 **OR** acetaminophen (TYLENOL) 160 MG/5ML solution 650 mg, 650 mg, Per Tube, Q4H PRN **OR** acetaminophen (TYLENOL) suppository 650 mg, 650 mg, Rectal, Q4H  PRN, Donnetta Simpers, MD   albuterol (PROVENTIL) (2.5 MG/3ML) 0.083% nebulizer solution 2.5 mg, 2.5 mg, Nebulization, Q4H PRN, Donnetta Simpers, MD, 2.5 mg at 05/08/21 0503   amLODipine (NORVASC) tablet 10 mg, 10 mg, Oral, Daily, Chand, Sudham, MD, 10 mg at 05/12/21 0901   aspirin EC tablet 81 mg, 81 mg, Oral, Daily, Chand, Sudham, MD, 81 mg at 05/12/21 0900   atorvastatin (LIPITOR) tablet 40 mg, 40 mg, Oral, Daily, Chand, Sudham, MD, 40 mg at 05/12/21 0901   cefTRIAXone (ROCEPHIN) 2 g in sodium chloride 0.9 % 100 mL IVPB, 2 g, Intravenous, Q24H, Chand, Sudham, MD, Last Rate: 200 mL/hr at 05/12/21 0946, 2 g at 05/12/21 0946   chlorhexidine (PERIDEX) 0.12 % solution 15 mL, 15 mL, Mouth Rinse, BID, Chand, Sudham, MD, 15 mL at 05/12/21 0900   Chlorhexidine Gluconate Cloth 2 % PADS 6 each, 6 each, Topical, Q0600, Donnetta Simpers, MD, 6 each at 05/11/21 1527   cloNIDine (CATAPRES) tablet 0.2 mg, 0.2 mg, Oral, Q8H, Chand, Sudham, MD, 0.2 mg at 05/12/21 0859   clopidogrel (PLAVIX) tablet 75 mg, 75 mg, Oral, Daily, Leonie Man, Pramod S, MD, 75 mg at 05/12/21 0947   enoxaparin (LOVENOX) injection 60 mg, 60 mg, Subcutaneous, Q24H, Leonie Man, Pramod S, MD, 60 mg at 05/12/21 0901   hydrALAZINE (APRESOLINE) injection 10-20 mg, 10-20 mg, Intravenous, Q6H PRN, Donnetta Simpers, MD, 20 mg at 05/11/21 1813   hydrochlorothiazide (HYDRODIURIL) tablet 25 mg, 25 mg, Oral, Daily, Chand, Sudham, MD, 25 mg at 05/12/21 0947   insulin aspart (novoLOG) injection 0-15 Units, 0-15 Units, Subcutaneous, Q4H, Chand, Sudham, MD, 3 Units at 05/12/21 0900   labetalol (NORMODYNE) injection 20 mg, 20 mg, Intravenous, Q10 min PRN, Leonie Man, Pramod S, MD   lisinopril (ZESTRIL) tablet 20 mg, 20 mg, Oral, Daily, Chand, Sudham, MD, 20 mg at 05/12/21 0901   LORazepam (ATIVAN) tablet 0.5 mg, 0.5 mg, Oral, Q4H PRN, Derek Jack, MD   MEDLINE mouth rinse, 15 mL, Mouth Rinse, q12n4p, Chand, Sudham, MD, 15 mL at 05/10/21 1641   melatonin  tablet 3 mg, 3 mg, Oral, QHS, Derek Jack, MD, 3 mg at 05/12/21 9735   nicotine (NICODERM CQ - dosed in mg/24 hours) patch 21 mg, 21 mg, Transdermal, Daily, Chand, Sudham, MD, 21 mg at 05/12/21 0903   pantoprazole (PROTONIX) EC tablet 40 mg, 40 mg, Oral, Daily, Chand, Sudham, MD, 40 mg at 05/12/21 0901   senna-docusate (Senokot-S) tablet 1 tablet, 1 tablet, Oral, QHS PRN, Josiah Lobo, MD  Patients Current Diet:  Diet Order             Diet heart healthy/carb modified Room service appropriate? Yes with Assist; Fluid consistency: Thin  Diet effective now                   Precautions / Restrictions Precautions Precautions: Fall, Other (comment) Precaution Comments: SBP <180; globally aphasic Restrictions Weight Bearing Restrictions: No   Has the  patient had 2 or more falls or a fall with injury in the past year? 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 Art therapist  Prior Functional Level Self Care: Did the patient need help bathing, dressing, using the toilet or eating? Independent  Indoor Mobility: Did the patient need assistance with walking from room to room (with or without device)? Independent  Stairs: Did the patient need assistance with internal or external stairs (with or without device)? Independent  Functional Cognition: Did the patient need help planning regular tasks such as shopping or remembering to take medications? Independent  Home Assistive Devices / Equipment Home Equipment: None  Prior Device Use: Indicate devices/aids used by the patient prior to current illness, exacerbation or injury? None of the above  Current Functional Level Cognition  Arousal/Alertness: Awake/alert Overall Cognitive Status: Impaired/Different from baseline Difficult to assess due to: Impaired communication Current Attention Level: Sustained Orientation Level: Oriented to person, Other (comment) (Unable to assess  otherwise) Following Commands: Follows one step commands inconsistently, Follows one step commands with increased time Safety/Judgement: Decreased awareness of deficits, Decreased awareness of safety General Comments: Pt generating majority "yes," responses incorrectly i.e. is your name Tommie Raymond? pt responds "yeah." Pt did respond "no" to are you having any pain? Pt unable to identify objects, sighing and stating, "I don't know." Decreased initiation for sitting up on side of bed Attention: Sustained Sustained Attention: Impaired Sustained Attention Impairment: Functional basic Memory:  (will assess as able and diagnostic tx) Awareness: Impaired Awareness Impairment: Emergent impairment Problem Solving: Impaired Problem Solving Impairment: Functional basic Safety/Judgment: Impaired    Extremity Assessment (includes Sensation/Coordination)  Upper Extremity Assessment: RUE deficits/detail RUE Deficits / Details: no activation this session, no visual attention to R UE RUE Sensation: decreased light touch, decreased proprioception RUE Coordination: decreased fine motor, decreased gross motor  Lower Extremity Assessment: Defer to PT evaluation RLE Deficits / Details: pt responded immediately to noxious stim and moved feet in response to light touch, was able to complete SLR but formal MMT and sensation testing limited by aphasia LLE Deficits / Details: pt responded immediately to noxious stim and moved feet in response to light touch, was able to complete SLR but formal MMT and sensation testing limited by aphasia    ADLs  Overall ADL's : Needs assistance/impaired Grooming: Maximal assistance, Sitting Grooming Details (indicate cue type and reason): pt putting brush in mouth and twirl instead of brushing does not apply paste even with visual cue. pt total (A) to don paste to brush. pt requires visual cue to open mouth and sustained Toilet Transfer: +2 for physical assistance, Moderate  assistance Toilet Transfer Details (indicate cue type and reason): simulated OOB to chair Toileting - Clothing Manipulation Details (indicate cue type and reason): incontinent of bladder without awareness Functional mobility during ADLs: +2 for physical assistance, Moderate assistance General ADL Comments: Pt progressed to eob with visual cues    Mobility  Overal bed mobility: Needs Assistance Bed Mobility: Supine to Sit Rolling: Min assist Sidelying to sit: Mod assist, +2 for physical assistance Supine to sit: Mod assist Sit to supine: +2 for physical assistance, Mod assist Sit to sidelying: Mod assist, +2 for physical assistance General bed mobility comments: Pt not initiating, modA for BLE's to edge of bed and then pt able to manage trunk to upright.    Transfers  Overall transfer level: Needs assistance Equipment used: 2 person hand held assist Transfers: Sit to/from Stand Sit to Stand: Min  assist, +2 physical assistance Stand pivot transfers: +2 physical assistance, Mod assist General transfer comment: MinA + 2 to power up and steady x 3. Decreased eccentric control with transition to sitting, also sitting pre-emptively on edge of bed when we re-entered the room    Ambulation / Gait / Stairs / Wheelchair Mobility  Ambulation/Gait Ambulation/Gait assistance: Min assist, +2 physical assistance Gait Distance (Feet): 150 Feet (100", 50") Assistive device: 2 person hand held assist Gait Pattern/deviations: Decreased stride length, Step-through pattern, Decreased weight shift to right, Decreased step length - right General Gait Details: Pt ambulating 100 ft, then an additional 50 ft with a seated rest break in between. Pt requiring minA + 2 for balance, pt son following with chair.Noted right knee instability and decreased weight shift, but no buckle. Cues for stepping initiation, momentum, upward gaze. Gait velocity: decreased    Posture / Balance Dynamic Sitting Balance Sitting  balance - Comments: initially reliant on modA to steady, then minG with UE support Balance Overall balance assessment: Needs assistance Sitting-balance support: Feet supported Sitting balance-Leahy Scale: Fair Sitting balance - Comments: initially reliant on modA to steady, then minG with UE support Postural control: Right lateral lean Standing balance support: Bilateral upper extremity supported, During functional activity Standing balance-Leahy Scale: Poor Standing balance comment: reliant on HHA    Special needs/care consideration Hb A1c 10.1 New diagnosis HTN New diagnosis Has no PCP   Previous Home Environment  Living Arrangements: Alone  Lives With: Alone Available Help at Discharge: Family, Available 24 hours/day (children and siblings would provide 24/7 supervision) Type of Home: House Home Layout: One level Home Access: Stairs to enter [employer] of Steps: 3 Bathroom Shower/Tub: Multimedia programmer: Standard Bathroom Accessibility: Yes How Accessible: Accessible via walker Stamps: No  Discharge Living Setting Plans for Discharge Living Setting: Patient's home, Alone (family to provide 24/7 supervision) Type of Home at Discharge: House (townhome) Discharge Home Layout: One level Discharge Home Access: Stairs to enter Entrance Stairs-Rails: None Entrance Stairs-Number of Steps: 3 Discharge Bathroom Shower/Tub: Walk-in shower Discharge Bathroom Toilet: Standard Discharge Bathroom Accessibility: Yes How Accessible: Accessible via walker Does the patient have any problems obtaining your medications?: No  Social/Family/Support Systems Patient Roles: Parent (employee) Contact Information: daughter, [name] Anticipated Caregiver: daughter, sons, and siblings Anticipated Caregiver's Contact Information: see above Ability/Limitations of Caregiver: children work, but they and patient's siblings will assist Caregiver Availability:  24/7 Discharge Plan Discussed with Primary Caregiver: Yes Is Caregiver In Agreement with Plan?: Yes Does Caregiver/Family have Issues with Lodging/Transportation while Pt is in Rehab?: No  Daughter, [name], is main contact, not twin sons. Daughter is working with a Chief Executive Officer on guardenship  Goals Patient/Family Goal for Rehab: supervision to min asisst with PT, OT and SLP Expected length of stay: ELOS 2 to 3 weeks Pt/Family Agrees to Admission and willing to participate: Yes Program Orientation Provided & Reviewed with Pt/Caregiver Including Roles  & Responsibilities: Yes  Decrease burden of Care through IP rehab admission: n/a  Possible need for SNF placement upon discharge: not anticipated  Patient Condition: I have reviewed medical records from [facility] , spoken with CM, and patient, son, daughter and family member. I met with patient at the bedside for inpatient rehabilitation assessment.  Patient will benefit from ongoing PT, OT and SLP, can actively participate in 3 hours of therapy a day 5 days of the week, and can make measurable gains during the admission.  Patient will also benefit from the  coordinated team approach during an Inpatient Acute Rehabilitation admission.  The patient will receive intensive therapy as well as Rehabilitation physician, nursing, social worker, and care management interventions.  Due to bladder management, bowel management, safety, skin/wound care, disease management, medication administration, pain management and patient education the patient requires 24 hour a day rehabilitation nursing.  The patient is currently min ot mod assist overall with mobility and basic ADLs.  Discharge setting and therapy post discharge at home with home health is anticipated.  Patient has agreed to participate in the Acute Inpatient Rehabilitation Program and will admit today.  Preadmission Screen Completed By:  Cleatrice Burke, 05/12/2021 10:24  AM ______________________________________________________________________   Discussed status with Dr. Dagoberto Ligas  on  05/12/2021 at  1025 and received approval for admission today.  Admission Coordinator:  Cleatrice Burke, RN, time  9604 Date  05/12/2021   Assessment/Plan: Diagnosis: Does the need for close, 24 hr/day Medical supervision in concert with the patient's rehab needs make it unreasonable for this patient to be served in a less intensive setting? Yes Co-Morbidities requiring supervision/potential complications: aphasia, L MCA stroke; pneumonia; R hemiparesis Due to bladder management, bowel management, safety, skin/wound care, disease management, medication administration, and patient education, does the patient require 24 hr/day rehab nursing? Yes Does the patient require coordinated care of a physician, rehab nurse, PT, OT, and SLP to address physical and functional deficits in the context of the above medical diagnosis(es)? Yes Addressing deficits in the following areas: balance, endurance, locomotion, strength, transferring, bowel/bladder control, bathing, dressing, feeding, grooming, toileting, cognition, speech, and language Can the patient actively participate in an intensive therapy program of at least 3 hrs of therapy 5 days a week? Yes The potential for patient to make measurable gains while on inpatient rehab is good Anticipated functional outcomes upon discharge from inpatient rehab: supervision and min assist PT, supervision and min assist OT, supervision and min assist SLP Estimated rehab length of stay to reach the above functional goals is: 2-3 weeks Anticipated discharge destination: Home 10. Overall Rehab/Functional Prognosis: good   MD Signature:

## 2021-05-12 ENCOUNTER — Inpatient Hospital Stay (HOSPITAL_COMMUNITY)
Admission: RE | Admit: 2021-05-12 | Discharge: 2021-05-26 | DRG: 056 | Disposition: A | Discharge status: Home Health | Payer: BC Managed Care – PPO | Source: Intra-hospital | Attending: Physical Medicine & Rehabilitation | Admitting: Physical Medicine & Rehabilitation

## 2021-05-12 ENCOUNTER — Other Ambulatory Visit: Payer: Self-pay

## 2021-05-12 ENCOUNTER — Inpatient Hospital Stay (HOSPITAL_COMMUNITY): Payer: BC Managed Care – PPO

## 2021-05-12 DIAGNOSIS — E876 Hypokalemia: Secondary | ICD-10-CM | POA: Diagnosis present

## 2021-05-12 DIAGNOSIS — F17200 Nicotine dependence, unspecified, uncomplicated: Secondary | ICD-10-CM | POA: Diagnosis present

## 2021-05-12 DIAGNOSIS — E785 Hyperlipidemia, unspecified: Secondary | ICD-10-CM | POA: Diagnosis present

## 2021-05-12 DIAGNOSIS — R1310 Dysphagia, unspecified: Secondary | ICD-10-CM | POA: Diagnosis present

## 2021-05-12 DIAGNOSIS — I69351 Hemiplegia and hemiparesis following cerebral infarction affecting right dominant side: Secondary | ICD-10-CM | POA: Diagnosis present

## 2021-05-12 DIAGNOSIS — I69390 Apraxia following cerebral infarction: Secondary | ICD-10-CM

## 2021-05-12 DIAGNOSIS — E119 Type 2 diabetes mellitus without complications: Secondary | ICD-10-CM | POA: Diagnosis present

## 2021-05-12 DIAGNOSIS — Z6834 Body mass index (BMI) 34.0-34.9, adult: Secondary | ICD-10-CM | POA: Diagnosis not present

## 2021-05-12 DIAGNOSIS — E669 Obesity, unspecified: Secondary | ICD-10-CM | POA: Diagnosis present

## 2021-05-12 DIAGNOSIS — H53461 Homonymous bilateral field defects, right side: Secondary | ICD-10-CM | POA: Diagnosis present

## 2021-05-12 DIAGNOSIS — I69398 Other sequelae of cerebral infarction: Secondary | ICD-10-CM | POA: Diagnosis not present

## 2021-05-12 DIAGNOSIS — I69320 Aphasia following cerebral infarction: Secondary | ICD-10-CM | POA: Diagnosis not present

## 2021-05-12 DIAGNOSIS — I10 Essential (primary) hypertension: Secondary | ICD-10-CM | POA: Diagnosis present

## 2021-05-12 DIAGNOSIS — R392 Extrarenal uremia: Secondary | ICD-10-CM | POA: Diagnosis present

## 2021-05-12 DIAGNOSIS — R4701 Aphasia: Secondary | ICD-10-CM | POA: Diagnosis not present

## 2021-05-12 DIAGNOSIS — J158 Pneumonia due to other specified bacteria: Secondary | ICD-10-CM | POA: Diagnosis present

## 2021-05-12 DIAGNOSIS — I639 Cerebral infarction, unspecified: Secondary | ICD-10-CM | POA: Diagnosis present

## 2021-05-12 DIAGNOSIS — R7989 Other specified abnormal findings of blood chemistry: Secondary | ICD-10-CM | POA: Diagnosis not present

## 2021-05-12 DIAGNOSIS — I69391 Dysphagia following cerebral infarction: Secondary | ICD-10-CM | POA: Diagnosis not present

## 2021-05-12 LAB — GLUCOSE, CAPILLARY
Glucose-Capillary: 136 mg/dL — ABNORMAL HIGH (ref 70–99)
Glucose-Capillary: 138 mg/dL — ABNORMAL HIGH (ref 70–99)
Glucose-Capillary: 142 mg/dL — ABNORMAL HIGH (ref 70–99)
Glucose-Capillary: 158 mg/dL — ABNORMAL HIGH (ref 70–99)
Glucose-Capillary: 177 mg/dL — ABNORMAL HIGH (ref 70–99)
Glucose-Capillary: 179 mg/dL — ABNORMAL HIGH (ref 70–99)

## 2021-05-12 LAB — CBC
HCT: 46.7 % (ref 39.0–52.0)
Hemoglobin: 15.6 g/dL (ref 13.0–17.0)
MCH: 28 pg (ref 26.0–34.0)
MCHC: 33.4 g/dL (ref 30.0–36.0)
MCV: 83.7 fL (ref 80.0–100.0)
Platelets: 277 10*3/uL (ref 150–400)
RBC: 5.58 MIL/uL (ref 4.22–5.81)
RDW: 13.8 % (ref 11.5–15.5)
WBC: 8.5 10*3/uL (ref 4.0–10.5)
nRBC: 0 % (ref 0.0–0.2)

## 2021-05-12 LAB — CREATININE, SERUM
Creatinine, Ser: 1.41 mg/dL — ABNORMAL HIGH (ref 0.61–1.24)
GFR, Estimated: 58 mL/min — ABNORMAL LOW (ref >60)

## 2021-05-12 MED ORDER — ASPIRIN EC 81 MG PO TBEC
81.0000 mg | DELAYED_RELEASE_TABLET | Freq: Every day | ORAL | Status: DC
  Administered 2021-05-13 – 2021-05-26 (×14): 81 mg via ORAL
  Filled 2021-05-12 (×14): qty 1

## 2021-05-12 MED ORDER — ACETAMINOPHEN 325 MG PO TABS
650.0000 mg | ORAL_TABLET | ORAL | Status: DC | PRN
  Administered 2021-05-12: 650 mg via ORAL
  Filled 2021-05-12 (×2): qty 2

## 2021-05-12 MED ORDER — LISINOPRIL 20 MG PO TABS
20.0000 mg | ORAL_TABLET | Freq: Every day | ORAL | Status: DC
  Administered 2021-05-13 – 2021-05-16 (×4): 20 mg via ORAL
  Filled 2021-05-12 (×4): qty 1

## 2021-05-12 MED ORDER — SODIUM CHLORIDE 0.9 % IV SOLN: 2.0000 g | INTRAVENOUS | Status: DC

## 2021-05-12 MED ORDER — NICOTINE 21 MG/24HR TD PT24: 21.0000 mg | MEDICATED_PATCH | Freq: Every day | TRANSDERMAL | 0 refills | Status: DC

## 2021-05-12 MED ORDER — AMLODIPINE BESYLATE 10 MG PO TABS: 10.0000 mg | ORAL_TABLET | Freq: Every day | ORAL | Status: DC

## 2021-05-12 MED ORDER — CLOPIDOGREL BISULFATE 75 MG PO TABS
75.0000 mg | ORAL_TABLET | Freq: Every day | ORAL | Status: DC
Start: 2021-05-13 — End: 2021-05-25

## 2021-05-12 MED ORDER — SENNOSIDES-DOCUSATE SODIUM 8.6-50 MG PO TABS
1.0000 | ORAL_TABLET | Freq: Every evening | ORAL | Status: DC | PRN
  Administered 2021-05-12 – 2021-05-22 (×4): 1 via ORAL
  Filled 2021-05-12 (×4): qty 1

## 2021-05-12 MED ORDER — ENOXAPARIN SODIUM 60 MG/0.6ML IJ SOSY
60.0000 mg | PREFILLED_SYRINGE | INTRAMUSCULAR | Status: DC
  Administered 2021-05-13 – 2021-05-26 (×14): 60 mg via SUBCUTANEOUS
  Filled 2021-05-12 (×14): qty 0.6

## 2021-05-12 MED ORDER — LIVING WELL WITH DIABETES BOOK
Freq: Once | Status: AC
  Filled 2021-05-12: qty 1

## 2021-05-12 MED ORDER — CLONIDINE HCL 0.2 MG PO TABS: 0.2000 mg | ORAL_TABLET | Freq: Three times a day (TID) | ORAL | Status: DC

## 2021-05-12 MED ORDER — FLEET ENEMA 7-19 GM/118ML RE ENEM: 1.0000 | ENEMA | Freq: Once | RECTAL | Status: DC | PRN

## 2021-05-12 MED ORDER — ATORVASTATIN CALCIUM 40 MG PO TABS
40.0000 mg | ORAL_TABLET | Freq: Every day | ORAL | Status: DC
Start: 2021-05-13 — End: 2021-05-25

## 2021-05-12 MED ORDER — AMLODIPINE BESYLATE 10 MG PO TABS
10.0000 mg | ORAL_TABLET | Freq: Every day | ORAL | Status: DC
  Administered 2021-05-13 – 2021-05-26 (×14): 10 mg via ORAL
  Filled 2021-05-12 (×14): qty 1

## 2021-05-12 MED ORDER — MELATONIN 3 MG PO TABS
3.0000 mg | ORAL_TABLET | Freq: Every day | ORAL | Status: DC
  Administered 2021-05-12 – 2021-05-25 (×13): 3 mg via ORAL
  Filled 2021-05-12 (×14): qty 1

## 2021-05-12 MED ORDER — EXERCISE FOR HEART AND HEALTH BOOK
Freq: Once | Status: AC
  Filled 2021-05-12: qty 1

## 2021-05-12 MED ORDER — ACETAMINOPHEN 650 MG RE SUPP: 650.0000 mg | RECTAL | Status: DC | PRN

## 2021-05-12 MED ORDER — PANTOPRAZOLE SODIUM 40 MG PO TBEC
40.0000 mg | DELAYED_RELEASE_TABLET | Freq: Every day | ORAL | Status: DC
  Administered 2021-05-13 – 2021-05-26 (×14): 40 mg via ORAL
  Filled 2021-05-12 (×14): qty 1

## 2021-05-12 MED ORDER — SODIUM CHLORIDE 0.9 % IV SOLN
2.0000 g | INTRAVENOUS | Status: AC
  Administered 2021-05-13 – 2021-05-15 (×3): 2 g via INTRAVENOUS
  Filled 2021-05-12: qty 20
  Filled 2021-05-12 (×2): qty 2

## 2021-05-12 MED ORDER — HYDROCHLOROTHIAZIDE 25 MG PO TABS
25.0000 mg | ORAL_TABLET | Freq: Every day | ORAL | Status: DC
  Administered 2021-05-13: 25 mg via ORAL
  Filled 2021-05-12: qty 1

## 2021-05-12 MED ORDER — CLOPIDOGREL BISULFATE 75 MG PO TABS
75.0000 mg | ORAL_TABLET | Freq: Every day | ORAL | Status: DC
  Administered 2021-05-13 – 2021-05-26 (×14): 75 mg via ORAL
  Filled 2021-05-12 (×13): qty 1

## 2021-05-12 MED ORDER — ASPIRIN 81 MG PO TBEC: 81.0000 mg | DELAYED_RELEASE_TABLET | Freq: Every day | ORAL | Status: AC

## 2021-05-12 MED ORDER — CLONIDINE HCL 0.1 MG PO TABS
0.2000 mg | ORAL_TABLET | Freq: Three times a day (TID) | ORAL | Status: DC
  Administered 2021-05-12 – 2021-05-13 (×3): 0.2 mg via ORAL
  Filled 2021-05-12 (×3): qty 2

## 2021-05-12 MED ORDER — LISINOPRIL 20 MG PO TABS: 20.0000 mg | ORAL_TABLET | Freq: Every day | ORAL | Status: DC

## 2021-05-12 MED ORDER — ENOXAPARIN SODIUM 60 MG/0.6ML IJ SOSY: 60.0000 mg | PREFILLED_SYRINGE | INTRAMUSCULAR | Status: DC

## 2021-05-12 MED ORDER — NICOTINE 21 MG/24HR TD PT24
21.0000 mg | MEDICATED_PATCH | Freq: Every day | TRANSDERMAL | Status: DC
  Administered 2021-05-13 – 2021-05-26 (×14): 21 mg via TRANSDERMAL
  Filled 2021-05-12 (×14): qty 1

## 2021-05-12 MED ORDER — HYDROCHLOROTHIAZIDE 25 MG PO TABS: 25.0000 mg | ORAL_TABLET | Freq: Every day | ORAL | Status: DC

## 2021-05-12 MED ORDER — POLYETHYLENE GLYCOL 3350 17 G PO PACK
17.0000 g | PACK | Freq: Every day | ORAL | Status: DC
  Administered 2021-05-12 – 2021-05-26 (×15): 17 g via ORAL
  Filled 2021-05-12 (×15): qty 1

## 2021-05-12 MED ORDER — LORAZEPAM 0.5 MG PO TABS
0.5000 mg | ORAL_TABLET | ORAL | Status: DC | PRN
  Filled 2021-05-12: qty 1

## 2021-05-12 MED ORDER — BLOOD PRESSURE CONTROL BOOK
Freq: Once | Status: AC
  Filled 2021-05-12: qty 1

## 2021-05-12 MED ORDER — INSULIN ASPART 100 UNIT/ML IJ SOLN
0.0000 [IU] | INTRAMUSCULAR | Status: DC
  Administered 2021-05-12: 3 [IU] via SUBCUTANEOUS
  Administered 2021-05-12 – 2021-05-13 (×2): 2 [IU] via SUBCUTANEOUS
  Administered 2021-05-13: 3 [IU] via SUBCUTANEOUS
  Administered 2021-05-13: 2 [IU] via SUBCUTANEOUS
  Administered 2021-05-13: 3 [IU] via SUBCUTANEOUS
  Administered 2021-05-13 – 2021-05-14 (×3): 2 [IU] via SUBCUTANEOUS
  Administered 2021-05-14: 5 [IU] via SUBCUTANEOUS
  Administered 2021-05-14 (×3): 2 [IU] via SUBCUTANEOUS

## 2021-05-12 MED ORDER — ACETAMINOPHEN 160 MG/5ML PO SOLN: 650.0000 mg | ORAL | Status: DC | PRN

## 2021-05-12 MED ORDER — ATORVASTATIN CALCIUM 40 MG PO TABS
40.0000 mg | ORAL_TABLET | Freq: Every day | ORAL | Status: DC
  Administered 2021-05-13 – 2021-05-26 (×14): 40 mg via ORAL
  Filled 2021-05-12 (×14): qty 1

## 2021-05-12 MED ORDER — ALBUTEROL SULFATE (2.5 MG/3ML) 0.083% IN NEBU: 2.5000 mg | INHALATION_SOLUTION | RESPIRATORY_TRACT | Status: DC | PRN

## 2021-05-12 NOTE — Progress Notes (Signed)
Physical Therapy Treatment Patient Details Name: Peter Lucas MRN: [id number] DOB: 1966-03-19 Today's Date: 05/12/2021    History of Present Illness 55 yo male presenting 6/4 with dense aphasia. Imaging revealed L MCA infarct, and the pt is now s/p thrombectomy with revascularization with reocclusion. Attempted extubation 6/5, but required reintubation later that day due to hypoxia and worsening neuro exam. Pt with no significant PMH on file other than heavy tobacco use, but does not consistently access medical care.    PT Comments    Pt eager to mobilize, PT states "let's get up" and pt initiates pull to sit and move to EOB even though railing is up. Pt inconsistently follows one-step commands and responds "yes" frequently to questions, both appropriately and inappropriately. Pt ambulatory for hallway distance with min steadying assist +1, close chair follow required as pt can be impulsive and difficult to cue when mobilizing. Pt with veering L frequently during hallway ambulation, tactile/demonstrative/verbal cues needed to correct. Pt remains appropriate for CIR at this time.    Follow Up Recommendations  CIR     Equipment Recommendations  3in1 (PT)    Recommendations for Other Services       Precautions / Restrictions Precautions Precautions: Fall;Other (comment) Precaution Comments: SBP <180; globally aphasic Restrictions Weight Bearing Restrictions: No    Mobility  Bed Mobility Overal bed mobility: Needs Assistance Bed Mobility: Supine to Sit;Sit to Supine     Supine to sit: Min assist Sit to supine: Min assist   General bed mobility comments: Min assist for LE maangement to/from EOB. Increased time and requires cues to "wait" as pt initating scoot to EOB when bedrail still up.    Transfers Overall transfer level: Needs assistance Equipment used: 1 person hand held assist Transfers: Sit to/from Stand   Stand pivot transfers: Min assist       General transfer  comment: min assist for power up, rise, steady. STS x3, from EOB x2 and recliner x1.  Ambulation/Gait Ambulation/Gait assistance: Min assist;+2 safety/equipment Gait Distance (Feet): 100 Feet (+50) Assistive device: 1 person hand held assist Gait Pattern/deviations: Decreased stride length;Step-through pattern;Decreased weight shift to right;Staggering left Gait velocity: decr   General Gait Details: Min assist to steady, pt with heavy veering L in hallway even with PT standing on R side. Pt inconsistently following directional cues in hallway, sees a chair and attempts to sit down before PT could cue otherwise. Close chair follow given pt unpredictability.   Stairs             Wheelchair Mobility    Modified Rankin (Stroke Patients Only) Modified Rankin (Stroke Patients Only) Pre-Morbid Rankin Score: No symptoms Modified Rankin: Moderately severe disability     Balance Overall balance assessment: Needs assistance Sitting-balance support: Feet supported Sitting balance-Leahy Scale: Fair     Standing balance support: During functional activity;Single extremity supported Standing balance-Leahy Scale: Poor Standing balance comment: reliant on HHA                            Cognition Arousal/Alertness: Awake/alert Behavior During Therapy: WFL for tasks assessed/performed Overall Cognitive Status: Impaired/Different from baseline Area of Impairment: Following commands;Attention;Safety/judgement;Problem solving                   Current Attention Level: Sustained   Following Commands: Follows one step commands inconsistently;Follows one step commands with increased time Safety/Judgement: Decreased awareness of deficits;Decreased awareness of safety   Problem Solving: Requires  verbal cues;Decreased initiation General Comments: Pt responds yes to a majority of questions, makes automatic statements like "let's go", "I love ya girl". Pt inconsistently  follows verbal commands secondary to aphasia      Exercises      General Comments        Pertinent Vitals/Pain Pain Assessment: No/denies pain    Home Living                      Prior Function            PT Goals (current goals can now be found in the care plan section) Acute Rehab PT Goals Patient Stated Goal: unable to state PT Goal Formulation: With patient Time For Goal Achievement: 05/23/21 Potential to Achieve Goals: Good    Frequency    Min 4X/week      PT Plan Current plan remains appropriate    Co-evaluation              AM-PAC PT "6 Clicks" Mobility   Outcome Measure  Help needed turning from your back to your side while in a flat bed without using bedrails?: A Little Help needed moving from lying on your back to sitting on the side of a flat bed without using bedrails?: A Little Help needed moving to and from a bed to a chair (including a wheelchair)?: A Lot Help needed standing up from a chair using your arms (e.g., wheelchair or bedside chair)?: A Little Help needed to walk in hospital room?: A Little Help needed climbing 3-5 steps with a railing? : A Lot 6 Click Score: 16    End of Session Equipment Utilized During Treatment: Gait belt Activity Tolerance: Patient tolerated treatment well Patient left: with call bell/phone within reach;with family/visitor present;in bed;with bed alarm set;with nursing/sitter in room Nurse Communication: Mobility status PT Visit Diagnosis: Unsteadiness on feet (R26.81);Other abnormalities of gait and mobility (R26.89);Muscle weakness (generalized) (M62.81)     Time: 5009-3818 PT Time Calculation (min) (ACUTE ONLY): 23 min  Charges:  $Gait Training: 8-22 mins $Therapeutic Activity: 8-22 mins                     Peter Lucas, PT DPT Acute Rehabilitation Services Pager [phone]  Office [phone]    Peter Lucas E Peter Lucas 05/12/2021, 1:21 PM

## 2021-05-12 NOTE — H&P (Signed)
Physical Medicine and Rehabilitation Admission H&P        Chief Complaint  Patient presents with   Code Stroke  : HPI: Colin Bentonony R. Renda is a 55 year old right-handed male with unremarkable past medical history who has not seen a PCP for many years as well as history of tobacco use, obesity with BMI 34.28.  He is on no prescription medications.  Per chart review patient lives alone.  1 level home 3 steps to entry.  Independent prior to admission working at [employer].  He has good family support with multiple family members in the area.  Presented 05/07/2021 with acute onset of aphasia and right side weakness.  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  No hemorrhagic transformation.  Patient did not receive tPA.  CTA of head and neck positive for occlusion of the posterior left MCA branch, distal M2 versus proximal M3.  Patient underwent thrombectomy per interventional radiology.  Latest cranial CT scan showed more pronounced low density within the left MCA branch vessel infarction.  No midline shift or hemorrhage.  Echocardiogram ejection fraction of 55 to 60% no wall motion abnormalities.  Admission chemistries unremarkable except glucose 185 alcohol negative WBC 11,100.  Patient did require initial intubation after thrombectomy was ultimately extubated but became hypoxic requiring reintubation with final extubation 05/09/2021.  He did spike a low-grade fever respiratory cultures grew gram-positive cocci in pairs and chains currently maintained on Rocephin 05/10/2021 x5 to 7 days.  Currently maintained on aspirin as well as Plavix for CVA prophylaxis.  Subcutaneous Lovenox for DVT prophylaxis.  Close monitoring of blood pressure initially on Cleviprex and since discontinued.  Findings of elevated hemoglobin A1c 10.1 with sliding scale insulin added. .  Patient was advanced to a regular consistency diet.  Therapy evaluations completed due to patient's  decreased functional mobility and aphasia was admitted for a comprehensive rehab program.   Pt's family able to say pt didn't have PCP- and is on regular diet now. "Speaking fine" but doesn't understand/respond correctly. Pt has a condom catheter per nursing- not clear when had LBM.    Review of Systems Constitutional:  Negative for chills and fever. HENT:  Negative for hearing loss.   Eyes:  Negative for blurred vision. Respiratory:  Negative for cough and shortness of breath.   Cardiovascular:  Negative for chest pain, palpitations and leg swelling. Gastrointestinal:  Positive for constipation. Negative for heartburn, nausea and vomiting. Genitourinary:  Negative for dysuria and flank pain. Musculoskeletal:  Positive for myalgias. Skin:  Negative for rash. Neurological:  Positive for speech change, weakness and headaches.  All other systems reviewed and are negative. History reviewed. No pertinent past medical history.      Past Surgical History:  Procedure Laterality Date   BACK SURGERY        cyst removal   IR CT HEAD LTD   05/07/2021   IR PERCUTANEOUS ART THROMBECTOMY/INFUSION INTRACRANIAL INC DIAG ANGIO   05/07/2021   RADIOLOGY WITH ANESTHESIA N/A 05/07/2021    Procedure: IR WITH ANESTHESIA;  Surgeon: Radiologist, Medication, MD;  Location: [facility] OR;  Service: Radiology;  Laterality: N/A;    History reviewed. No pertinent family history. Social History:  reports that he has been smoking. He does not have any smokeless tobacco history on file. No history on file for alcohol use and drug use. Allergies: No Known Allergies No medications prior to admission.      Drug Regimen Review  Drug regimen was reviewed and remains appropriate with no significant issues identified   Home: Home Living Family/patient expects to be discharged to:: Private residence Living Arrangements: Alone Available Help at Discharge: Family, Available 24 hours/day (children and siblings would provide 24/7  supervision) Type of Home: House Home Access: Stairs to enter Secretary/administrator of Steps: 3 Home Layout: One level Bathroom Shower/Tub: Health visitor: Pharmacist, community: Yes Home Equipment: None  Lives With: Alone   Functional History: Prior Function Level of Independence: Independent Comments: works for [employer] Status:  Mobility: Bed Mobility Overal bed mobility: Needs Assistance Bed Mobility: Supine to Sit Rolling: Min assist Sidelying to sit: Mod assist, +2 for physical assistance Supine to sit: Mod assist Sit to supine: +2 for physical assistance, Mod assist Sit to sidelying: Mod assist, +2 for physical assistance General bed mobility comments: Pt not initiating, modA for BLE's to edge of bed and then pt able to manage trunk to upright. Transfers Overall transfer level: Needs assistance Equipment used: 2 person hand held assist Transfers: Sit to/from Stand Sit to Stand: Min assist, +2 physical assistance Stand pivot transfers: +2 physical assistance, Mod assist General transfer comment: MinA + 2 to power up and steady x 3. Decreased eccentric control with transition to sitting, also sitting pre-emptively on edge of bed when we re-entered the room Ambulation/Gait Ambulation/Gait assistance: Min assist, +2 physical assistance Gait Distance (Feet): 150 Feet (100", 50") Assistive device: 2 person hand held assist Gait Pattern/deviations: Decreased stride length, Step-through pattern, Decreased weight shift to right, Decreased step length - right General Gait Details: Pt ambulating 100 ft, then an additional 50 ft with a seated rest break in between. Pt requiring minA + 2 for balance, pt son following with chair.Noted right knee instability and decreased weight shift, but no buckle. Cues for stepping initiation, momentum, upward gaze. Gait velocity: decreased   ADL: ADL Overall ADL's : Needs assistance/impaired Grooming:  Maximal assistance, Sitting Grooming Details (indicate cue type and reason): pt putting brush in mouth and twirl instead of brushing does not apply paste even with visual cue. pt total (A) to don paste to brush. pt requires visual cue to open mouth and sustained Toilet Transfer: +2 for physical assistance, Moderate assistance Toilet Transfer Details (indicate cue type and reason): simulated OOB to chair Toileting - Clothing Manipulation Details (indicate cue type and reason): incontinent of bladder without awareness Functional mobility during ADLs: +2 for physical assistance, Moderate assistance General ADL Comments: Pt progressed to eob with visual cues   Cognition: Cognition Overall Cognitive Status: Impaired/Different from baseline Arousal/Alertness: Awake/alert Orientation Level: Oriented X4 Attention: Sustained Sustained Attention: Impaired Sustained Attention Impairment: Functional basic Memory:  (will assess as able and diagnostic tx) Awareness: Impaired Awareness Impairment: Emergent impairment Problem Solving: Impaired Problem Solving Impairment: Functional basic Safety/Judgment: Impaired Cognition Arousal/Alertness: Awake/alert Behavior During Therapy: WFL for tasks assessed/performed Overall Cognitive Status: Impaired/Different from baseline Area of Impairment: Following commands, Attention, Safety/judgement, Problem solving Current Attention Level: Sustained Following Commands: Follows one step commands inconsistently, Follows one step commands with increased time Safety/Judgement: Decreased awareness of deficits, Decreased awareness of safety Awareness:  (no verbalizations) Problem Solving: Requires verbal cues, Decreased initiation General Comments: Pt generating majority "yes," responses incorrectly i.e. is your name Brynda Greathouse? pt responds "yeah." Pt did respond "no" to are you having any pain? Pt unable to identify objects, sighing and stating, "I don't know." Decreased  initiation for sitting up on side of bed Difficult to assess  due to: Impaired communication   Physical Exam: Blood pressure (!) 149/93, pulse 63, temperature 98.6 F (37 C), temperature source Oral, resp. rate 19, height  (1.905 m), weight 124.4 kg, SpO2 96 %. Physical Exam Vitals and nursing note reviewed. Exam conducted with a chaperone present. Constitutional:      Comments: Daughter and wife sitting bedside; pt sitting up in bed; large gentleman- nears tears when kept saying "I don't know" to every question, NAD  HENT:    Head: Normocephalic and atraumatic.    Comments: R facial droop- couldn't understand to stick tongue out- aphasia interfering with exam    Right Ear: External ear normal.    Left Ear: External ear normal.    Nose: Nose normal. No congestion.    Mouth/Throat:    Mouth: Mucous membranes are dry.    Pharynx: Oropharynx is clear. No oropharyngeal exudate. Eyes:    General:        Right eye: No discharge.        Left eye: No discharge.    Comments: EOM grossly intact- couldn't comply with exam- no nystagmus seen  Cardiovascular:    Rate and Rhythm: Normal rate and regular rhythm.    Heart sounds: Normal heart sounds. No murmur heard.   No gallop. Pulmonary:    Comments: CTA B/L- no W/R/R- good air movement     Abdominal:    Comments: Protuberant; soft, NT, distended?; hypoactive  Genitourinary:    Comments: Condom catheter in place- medium amber urine Musculoskeletal:    Cervical back: Normal range of motion and neck supple.    Comments: Hard to assess due to inability to do exam/aphasia LUE and LLE appear 5/5 but cannot test some muscles RUE- triceps 4/5, grip 3-/5, finger abd 0/5- couldn't appear to understand bicep RLE- at least 4/5 in HF, DF and PF- couldn't understand KE/KF  Skin:    General: Skin is warm and dry.    Comments: R hand IV- getting IV ABX No skin breakdown otherwise- seen  Neurological:    Comments: Patient is alert.  Makes eye  contact with examiner.  Globally aphasic.  Follows some inconsistent commands. Pt is SEVERELY receptively aphasic- mild to  moderate expressive aphasia Not able to name anything- said I' don't know' including wife, daughter, pen. Cannot repeat anything But spontaneously will comment appropriately- something is cold, etc. Unable to assess sensation due to aphasia.    Psychiatric:    Comments: tearful      Lab Results Last 48 Hours        Results for orders placed or performed during the hospital encounter of 05/07/21 (from the past 48 hour(s))  Glucose, capillary     Status: Abnormal    Collection Time: 05/10/21  7:08 AM  Result Value Ref Range    Glucose-Capillary 184 (H) 70 - 99 mg/dL      Comment: Glucose reference range applies only to samples taken after fasting for at least 8 hours.  Glucose, capillary     Status: Abnormal    Collection Time: 05/10/21 11:19 AM  Result Value Ref Range    Glucose-Capillary 174 (H) 70 - 99 mg/dL      Comment: Glucose reference range applies only to samples taken after fasting for at least 8 hours.  Triglycerides     Status: Abnormal    Collection Time: 05/10/21 11:51 AM  Result Value Ref Range    Triglycerides 200 (H) <150 mg/dL      Comment: Performed  at [facility], [address] [place], [place] [address]  Glucose, capillary     Status: Abnormal    Collection Time: 05/10/21  4:08 PM  Result Value Ref Range    Glucose-Capillary 180 (H) 70 - 99 mg/dL      Comment: Glucose reference range applies only to samples taken after fasting for at least 8 hours.  Glucose, capillary     Status: Abnormal    Collection Time: 05/10/21  7:25 PM  Result Value Ref Range    Glucose-Capillary 200 (H) 70 - 99 mg/dL      Comment: Glucose reference range applies only to samples taken after fasting for at least 8 hours.  Glucose, capillary     Status: Abnormal    Collection Time: 05/10/21 11:14 PM  Result Value Ref Range    Glucose-Capillary 188 (H) 70  - 99 mg/dL      Comment: Glucose reference range applies only to samples taken after fasting for at least 8 hours.  Glucose, capillary     Status: Abnormal    Collection Time: 05/11/21  3:19 AM  Result Value Ref Range    Glucose-Capillary 164 (H) 70 - 99 mg/dL      Comment: Glucose reference range applies only to samples taken after fasting for at least 8 hours.  CBC     Status: None    Collection Time: 05/11/21  5:58 AM  Result Value Ref Range    WBC 9.3 4.0 - 10.5 K/uL    RBC 5.08 4.22 - 5.81 MIL/uL    Hemoglobin 14.2 13.0 - 17.0 g/dL    HCT 60.4 54.0 - 98.1 %    MCV 83.7 80.0 - 100.0 fL    MCH 28.0 26.0 - 34.0 pg    MCHC 33.4 30.0 - 36.0 g/dL    RDW 19.1 47.8 - 29.5 %    Platelets 220 150 - 400 K/uL    nRBC 0.0 0.0 - 0.2 %      Comment: Performed at [facility], [address] [place], [place] [address]  Basic metabolic panel     Status: Abnormal    Collection Time: 05/11/21  5:58 AM  Result Value Ref Range    Sodium 138 135 - 145 mmol/L    Potassium 3.2 (L) 3.5 - 5.1 mmol/L    Chloride 101 98 - 111 mmol/L    CO2 23 22 - 32 mmol/L    Glucose, Bld 163 (H) 70 - 99 mg/dL      Comment: Glucose reference range applies only to samples taken after fasting for at least 8 hours.    BUN 28 (H) 6 - 20 mg/dL    Creatinine, Ser 8.65 (H) 0.61 - 1.24 mg/dL    Calcium 8.8 (L) 8.9 - 10.3 mg/dL    GFR, Estimated >78 >46 mL/min      Comment: (NOTE) Calculated using the CKD-EPI Creatinine Equation (2021)      Anion gap 14 5 - 15      Comment: Performed at [facility], [address], [place], [place] [address]  Magnesium     Status: None    Collection Time: 05/11/21  5:58 AM  Result Value Ref Range    Magnesium 2.0 1.7 - 2.4 mg/dL      Comment: Performed at [facility], [address] [place], [place] [address]  Phosphorus     Status: None    Collection Time: 05/11/21  5:58 AM  Result Value Ref Range    Phosphorus 3.6 2.5 - 4.6 mg/dL      Comment: Performed at  [facility], [address], [place], [place] [address]  Glucose, capillary     Status: Abnormal    Collection Time: 05/11/21  7:27 AM  Result Value Ref Range    Glucose-Capillary 182 (H) 70 - 99 mg/dL      Comment: Glucose reference range applies only to samples taken after fasting for at least 8 hours.  Glucose, capillary     Status: Abnormal    Collection Time: 05/11/21 11:10 AM  Result Value Ref Range    Glucose-Capillary 190 (H) 70 - 99 mg/dL      Comment: Glucose reference range applies only to samples taken after fasting for at least 8 hours.  Glucose, capillary     Status: Abnormal    Collection Time: 05/11/21  4:25 PM  Result Value Ref Range    Glucose-Capillary 134 (H) 70 - 99 mg/dL      Comment: Glucose reference range applies only to samples taken after fasting for at least 8 hours.  Glucose, capillary     Status: Abnormal    Collection Time: 05/11/21  7:55 PM  Result Value Ref Range    Glucose-Capillary 142 (H) 70 - 99 mg/dL      Comment: Glucose reference range applies only to samples taken after fasting for at least 8 hours.    Comment 1 Notify RN      Comment 2 Document in Chart    Glucose, capillary     Status: Abnormal    Collection Time: 05/11/21 11:50 PM  Result Value Ref Range    Glucose-Capillary 181 (H) 70 - 99 mg/dL      Comment: Glucose reference range applies only to samples taken after fasting for at least 8 hours.    Comment 1 Notify RN      Comment 2 Document in Chart    Glucose, capillary     Status: Abnormal    Collection Time: 05/12/21  4:13 AM  Result Value Ref Range    Glucose-Capillary 177 (H) 70 - 99 mg/dL      Comment: Glucose reference range applies only to samples taken after fasting for at least 8 hours.    Comment 1 Notify RN      Comment 2 Document in Chart        Imaging Results (Last 48 hours)  No results found.             Medical Problem List and Plan: 1.  Right side weakness and aphasia secondary to ischemic infarct  within the left parietal temporal area in the setting of distal L M2 occlusion.  Status post thrombectomy.             -patient may shower             -ELOS/Goals: 2-3 weeks- supervision to min A 2.  Antithrombotics: -DVT/anticoagulation: Lovenox             -antiplatelet therapy: Aspirin 81 mg daily and Plavix 75 mg daily x90 days then aspirin alone beginning 08/08/2021 3. Pain Management: Tylenol as needed 4. Mood: Ativan 0.5 mg every 4 hours as needed anxiety             -antipsychotic agents: N/A 5. Neuropsych: This patient is not capable of making decisions on his own behalf. 6. Skin/Wound Care: Routine skin checks 7. Fluids/Electrolytes/Nutrition: Routine  in and outs with follow-up chemistries 8.  Dysphagia.  Carb modified nectar thick liquid.  And advance to a regular consistency 05/13/2021 follow-up speech therapy 9.  Acute respiratory failure with hypoxia and hypercapnia.  Gram-positive cocci pneumonia.  Ceftriaxone 05/10/2021 x 5 to 7 days.  Titrate oxygen as needed. 10.  New findings diabetes mellitus.  Hemoglobin A1c 10.1.  Currently on SSI.  Diabetic teaching 11.  Hypertension.  HCTZ 25 mg daily, clonidine 0.2 mg every 8 hours, Norvasc 10 mg daily, lisinopril 20 mg daily.  Monitor with increased mobility 12.  Hyperlipidemia.  Lipitor 13.  Obesity.  BMI 34.28.  Dietary follow-up 14.  Tobacco abuse.  NicoDerm patch.  Provide counseling 15. HAS NO PCP!   Mcarthur Rossetti Angiulli, PA-C 05/12/2021   I have personally performed a face to face diagnostic evaluation of this patient and formulated the key components of the plan.  Additionally, I have personally reviewed laboratory data, imaging studies, as well as relevant notes and concur with the physician assistant's documentation above.

## 2021-05-12 NOTE — Progress Notes (Signed)
Courtney Heys, MD   Physician  Physical Medicine and Rehabilitation  PMR Pre-admission      Signed  Date of Service:  05/11/2021  2:53 PM       Related encounter: ED to Hosp-Admission (Current) from 05/07/2021 in [facility]       Signed          Show:Clear all _0 Written_1 Templated_2 Copied  Added by: _3 Cristina Gong, RN_4 Courtney Heys, MD   _5 Hover for details                                                                                                                                                                                                                                                                                                                                                                                                                                         PMR Admission Coordinator Pre-Admission Assessment   Patient: Peter Lucas is an 55 y.o., male MRN: [id number] DOB: 1966-09-09 Height: _6  (190.5 cm) Weight: 124.4 kg (bed weight)   Insurance Information HMO:     PPO: yes     PCP:      IPA:      80/20:      OTHER: PRIMARY: [employer]      Policy#: [id number]      Subscriber: pt CM Name: Cassandra      Phone#: [phone] ext [phone]  Fax#: [phone] Pre-Cert#: [id number] for 7 days updates due 6/15 with Heidi Dach phone [phone] ext [phone] fax [phone]     Employer: [employer] Benefits:  Phone #: [phone]     Name: 6/8 Eff. Date: 12/04/2020     Deduct: $1400      Out of Pocket Max: $4000      Life Max: none CIR: 90%      SNF: 90%  100 days per year Outpatient:  90%    Co-Pay: 60 visits combined Home Health: 90%      Co-Pay: per medical neccesity DME: 90%     Co-Pay: 10% Providers: in network  SECONDARY: none   Financial Counselor:       Phone#:    The Engineer, [employer]" for patients in Inpatient Rehabilitation Facilities with attached "Privacy Act [place] Records" was provided and verbally reviewed with: Family   Emergency Contact Information Contact Information       Name Relation Home Work [place], [name] Daughter     [place], Haywood Son     [phone]    Audel, Coakley     [phone]           Current Medical History  Patient Admitting Diagnosis: CVA   History of Present Illness:55 year old right-handed male with unremarkable past medical history who has not seen a PCP for many years as well as history of tobacco use, obesity with BMI 34.28.  He is on no prescription medications.    Presented 05/07/2021 with acute onset of aphasia and right side weakness.  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  No hemorrhagic transformation.  Patient did not receive TPA.  CTA of head and neck positive for occlusion of the posterior left MCA branch, distal M2 versus proximal M3.  Patient underwent thrombectomy per interventional radiology.  Latest cranial CT scan showed more pronounced low density within the left MCA branch vessel infarction.  No midline shift or hemorrhage.  Echocardiogram ejection fraction of 55 to 60% no wall motion abnormalities.  Admission chemistries unremarkable except glucose 185 alcohol negative WBC 11,100.  Patient did require initial intubation after thrombectomy was ultimately extubated but became hypoxic requiring reintubation with final extubation 05/09/2021.  He did spike a low-grade fever respiratory cultures grew gram-positive cocci in pairs and chains currently maintained on Rocephin 05/10/2021 x 5 to 7 days.  Currently maintained on aspirin as well as Plavix for CVA prophylaxis.  Subcutaneous Lovenox for DVT prophylaxis.  Close monitoring of blood pressure initially on Cleviprex.  Findings of elevated  hemoglobin A1c 10.1 with sliding scale insulin added.  MBS 6/9 with diet upgraded to Regular diet with thin liquids.   Complete NIHSS TOTAL: 12   Patient's medical record [facility] has been reviewed by the rehabilitation admission coordinator and physician.   Past Medical History  History reviewed. No pertinent past medical history.   Family History   family history is not on file.   Prior Rehab/Hospitalizations Has the patient had prior rehab or hospitalizations prior to admission? Yes   Has the patient had major surgery during 100 days prior to admission? Yes              Current Medications   Current Facility-Administered Medications:   0.9 %  sodium chloride infusion, 250 mL, Intravenous, Continuous, Bowser, Shirlee Limerick E, NP   acetaminophen (TYLENOL) tablet 650 mg, 650 mg, Oral, Q4H  PRN, 650 mg at 05/11/21 1849 **OR** acetaminophen (TYLENOL) 160 MG/5ML solution 650 mg, 650 mg, Per Tube, Q4H PRN **OR** acetaminophen (TYLENOL) suppository 650 mg, 650 mg, Rectal, Q4H PRN, Donnetta Simpers, MD   albuterol (PROVENTIL) (2.5 MG/3ML) 0.083% nebulizer solution 2.5 mg, 2.5 mg, Nebulization, Q4H PRN, Donnetta Simpers, MD, 2.5 mg at 05/08/21 0503   amLODipine (NORVASC) tablet 10 mg, 10 mg, Oral, Daily, Chand, Sudham, MD, 10 mg at 05/12/21 0901   aspirin EC tablet 81 mg, 81 mg, Oral, Daily, Chand, Sudham, MD, 81 mg at 05/12/21 0900   atorvastatin (LIPITOR) tablet 40 mg, 40 mg, Oral, Daily, Chand, Sudham, MD, 40 mg at 05/12/21 0901   cefTRIAXone (ROCEPHIN) 2 g in sodium chloride 0.9 % 100 mL IVPB, 2 g, Intravenous, Q24H, Chand, Sudham, MD, Last Rate: 200 mL/hr at 05/12/21 0946, 2 g at 05/12/21 0946   chlorhexidine (PERIDEX) 0.12 % solution 15 mL, 15 mL, Mouth Rinse, BID, Chand, Sudham, MD, 15 mL at 05/12/21 0900   Chlorhexidine Gluconate Cloth 2 % PADS 6 each, 6 each, Topical, Q0600, Donnetta Simpers, MD, 6 each at 05/11/21 1527   cloNIDine (CATAPRES) tablet 0.2 mg, 0.2 mg, Oral,  Q8H, Chand, Sudham, MD, 0.2 mg at 05/12/21 0859   clopidogrel (PLAVIX) tablet 75 mg, 75 mg, Oral, Daily, Leonie Man, Pramod S, MD, 75 mg at 05/12/21 0947   enoxaparin (LOVENOX) injection 60 mg, 60 mg, Subcutaneous, Q24H, Leonie Man, Pramod S, MD, 60 mg at 05/12/21 0901   hydrALAZINE (APRESOLINE) injection 10-20 mg, 10-20 mg, Intravenous, Q6H PRN, Donnetta Simpers, MD, 20 mg at 05/11/21 1813   hydrochlorothiazide (HYDRODIURIL) tablet 25 mg, 25 mg, Oral, Daily, Chand, Sudham, MD, 25 mg at 05/12/21 0947   insulin aspart (novoLOG) injection 0-15 Units, 0-15 Units, Subcutaneous, Q4H, Chand, Sudham, MD, 3 Units at 05/12/21 0900   labetalol (NORMODYNE) injection 20 mg, 20 mg, Intravenous, Q10 min PRN, Leonie Man, Pramod S, MD   lisinopril (ZESTRIL) tablet 20 mg, 20 mg, Oral, Daily, Chand, Sudham, MD, 20 mg at 05/12/21 0901   LORazepam (ATIVAN) tablet 0.5 mg, 0.5 mg, Oral, Q4H PRN, Derek Jack, MD   MEDLINE mouth rinse, 15 mL, Mouth Rinse, q12n4p, Chand, Sudham, MD, 15 mL at 05/10/21 1641   melatonin tablet 3 mg, 3 mg, Oral, QHS, Derek Jack, MD, 3 mg at 05/12/21 7846   nicotine (NICODERM CQ - dosed in mg/24 hours) patch 21 mg, 21 mg, Transdermal, Daily, Chand, Sudham, MD, 21 mg at 05/12/21 0903   pantoprazole (PROTONIX) EC tablet 40 mg, 40 mg, Oral, Daily, Chand, Sudham, MD, 40 mg at 05/12/21 0901   senna-docusate (Senokot-S) tablet 1 tablet, 1 tablet, Oral, QHS PRN, Josiah Lobo, MD   Patients Current Diet:  Diet Order                  Diet heart healthy/carb modified Room service appropriate? Yes with Assist; Fluid consistency: Thin  Diet effective now                         Precautions / Restrictions Precautions Precautions: Fall, Other (comment) Precaution Comments: SBP <180; globally aphasic Restrictions Weight Bearing Restrictions: No    Has the patient had 2 or more falls or a fall with injury in the past year? No   Prior Activity Level Community (5-7x/wk): Independent,  active and driving. Manager of [employer]   Prior Functional Level Self Care: Did the patient need help bathing, dressing, using the toilet  or eating? Independent   Indoor Mobility: Did the patient need assistance with walking from room to room (with or without device)? Independent   Stairs: Did the patient need assistance with internal or external stairs (with or without device)? Independent   Functional Cognition: Did the patient need help planning regular tasks such as shopping or remembering to take medications? Independent   Home Assistive Devices / Equipment Home Equipment: None   Prior Device Use: Indicate devices/aids used by the patient prior to current illness, exacerbation or injury? None of the above   Current Functional Level Cognition   Arousal/Alertness: Awake/alert Overall Cognitive Status: Impaired/Different from baseline Difficult to assess due to: Impaired communication Current Attention Level: Sustained Orientation Level: Oriented to person, Other (comment) (Unable to assess otherwise) Following Commands: Follows one step commands inconsistently, Follows one step commands with increased time Safety/Judgement: Decreased awareness of deficits, Decreased awareness of safety General Comments: Pt generating majority "yes," responses incorrectly i.e. is your name Tommie Raymond? pt responds "yeah." Pt did respond "no" to are you having any pain? Pt unable to identify objects, sighing and stating, "I don't know." Decreased initiation for sitting up on side of bed Attention: Sustained Sustained Attention: Impaired Sustained Attention Impairment: Functional basic Memory:  (will assess as able and diagnostic tx) Awareness: Impaired Awareness Impairment: Emergent impairment Problem Solving: Impaired Problem Solving Impairment: Functional basic Safety/Judgment: Impaired    Extremity Assessment (includes Sensation/Coordination)   Upper Extremity Assessment: RUE  deficits/detail RUE Deficits / Details: no activation this session, no visual attention to R UE RUE Sensation: decreased light touch, decreased proprioception RUE Coordination: decreased fine motor, decreased gross motor  Lower Extremity Assessment: Defer to PT evaluation RLE Deficits / Details: pt responded immediately to noxious stim and moved feet in response to light touch, was able to complete SLR but formal MMT and sensation testing limited by aphasia LLE Deficits / Details: pt responded immediately to noxious stim and moved feet in response to light touch, was able to complete SLR but formal MMT and sensation testing limited by aphasia     ADLs   Overall ADL's : Needs assistance/impaired Grooming: Maximal assistance, Sitting Grooming Details (indicate cue type and reason): pt putting brush in mouth and twirl instead of brushing does not apply paste even with visual cue. pt total (A) to don paste to brush. pt requires visual cue to open mouth and sustained Toilet Transfer: +2 for physical assistance, Moderate assistance Toilet Transfer Details (indicate cue type and reason): simulated OOB to chair Toileting - Clothing Manipulation Details (indicate cue type and reason): incontinent of bladder without awareness Functional mobility during ADLs: +2 for physical assistance, Moderate assistance General ADL Comments: Pt progressed to eob with visual cues     Mobility   Overal bed mobility: Needs Assistance Bed Mobility: Supine to Sit Rolling: Min assist Sidelying to sit: Mod assist, +2 for physical assistance Supine to sit: Mod assist Sit to supine: +2 for physical assistance, Mod assist Sit to sidelying: Mod assist, +2 for physical assistance General bed mobility comments: Pt not initiating, modA for BLE's to edge of bed and then pt able to manage trunk to upright.     Transfers   Overall transfer level: Needs assistance Equipment used: 2 person hand held assist Transfers: Sit to/from  Stand Sit to Stand: Min assist, +2 physical assistance Stand pivot transfers: +2 physical assistance, Mod assist General transfer comment: MinA + 2 to power up and steady x 3. Decreased eccentric control with transition to sitting,  also sitting pre-emptively on edge of bed when we re-entered the room     Ambulation / Gait / Stairs / Wheelchair Mobility   Ambulation/Gait Ambulation/Gait assistance: Min assist, +2 physical assistance Gait Distance (Feet): 150 Feet (100", 50") Assistive device: 2 person hand held assist Gait Pattern/deviations: Decreased stride length, Step-through pattern, Decreased weight shift to right, Decreased step length - right General Gait Details: Pt ambulating 100 ft, then an additional 50 ft with a seated rest break in between. Pt requiring minA + 2 for balance, pt son following with chair.Noted right knee instability and decreased weight shift, but no buckle. Cues for stepping initiation, momentum, upward gaze. Gait velocity: decreased     Posture / Balance Dynamic Sitting Balance Sitting balance - Comments: initially reliant on modA to steady, then minG with UE support Balance Overall balance assessment: Needs assistance Sitting-balance support: Feet supported Sitting balance-Leahy Scale: Fair Sitting balance - Comments: initially reliant on modA to steady, then minG with UE support Postural control: Right lateral lean Standing balance support: Bilateral upper extremity supported, During functional activity Standing balance-Leahy Scale: Poor Standing balance comment: reliant on HHA     Special needs/care consideration Hb A1c 10.1 New diagnosis HTN New diagnosis Has no PCP    Previous Home Environment  Living Arrangements: Alone  Lives With: Alone Available Help at Discharge: Family, Available 24 hours/day (children and siblings would provide 24/7 supervision) Type of Home: House Home Layout: One level Home Access: Stairs to enter [employer]  of Steps: 3 Bathroom Shower/Tub: Multimedia programmer: Standard Bathroom Accessibility: Yes How Accessible: Accessible via walker [place]: No   Discharge Living Setting Plans for Discharge Living Setting: Patient's home, Alone (family to provide 24/7 supervision) Type of Home at Discharge: House (townhome) Discharge Home Layout: One level Discharge Home Access: Stairs to enter Entrance Stairs-Rails: None Entrance Stairs-Number of Steps: 3 Discharge Bathroom Shower/Tub: Walk-in shower Discharge Bathroom Toilet: Standard Discharge Bathroom Accessibility: Yes How Accessible: Accessible via walker Does the patient have any problems obtaining your medications?: No   Social/Family/Support Systems Patient Roles: Parent (employee) Contact Information: daughter, [name] Anticipated Caregiver: daughter, sons, and siblings Anticipated Caregiver's Contact Information: see above Ability/Limitations of Caregiver: children work, but they and patient's siblings will assist Caregiver Availability: 24/7 Discharge Plan Discussed with Primary Caregiver: Yes Is Caregiver In Agreement with Plan?: Yes Does Caregiver/Family have Issues with Lodging/Transportation while Pt is in Rehab?: No   Daughter, [name], is main contact, not twin sons. Daughter is working with a Chief Executive Officer on guardenship   Goals Patient/Family Goal for Rehab: supervision to min asisst with PT, OT and SLP Expected length of stay: ELOS 2 to 3 weeks Pt/Family Agrees to Admission and willing to participate: Yes Program Orientation Provided & Reviewed with Pt/Caregiver Including Roles  & Responsibilities: Yes   Decrease burden of Care through IP rehab admission: n/a   Possible need for SNF placement upon discharge: not anticipated   Patient Condition: I have reviewed medical records from [facility] , spoken with CM, and patient, son, daughter and family member. I met with patient at the bedside for  inpatient rehabilitation assessment.  Patient will benefit from ongoing PT, OT and SLP, can actively participate in 3 hours of therapy a day 5 days of the week, and can make measurable gains during the admission.  Patient will also benefit from the coordinated team approach during an Inpatient Acute Rehabilitation admission.  The patient will receive intensive therapy as well as Rehabilitation  physician, nursing, social worker, and care management interventions.  Due to bladder management, bowel management, safety, skin/wound care, disease management, medication administration, pain management and patient education the patient requires 24 hour a day rehabilitation nursing.  The patient is currently min ot mod assist overall with mobility and basic ADLs.  Discharge setting and therapy post discharge at home with home health is anticipated.  Patient has agreed to participate in the Acute Inpatient Rehabilitation Program and will admit today.   Preadmission Screen Completed By:  Cleatrice Burke, 05/12/2021 10:24 AM ______________________________________________________________________   Discussed status with Dr. Dagoberto Ligas  on  05/12/2021 at  1025 and received approval for admission today.   Admission Coordinator:  Cleatrice Burke, RN, time  9967 Date  05/12/2021   Assessment/Plan: Diagnosis: Does the need for close, 24 hr/day Medical supervision in concert with the patient's rehab needs make it unreasonable for this patient to be served in a less intensive setting? Yes Co-Morbidities requiring supervision/potential complications: aphasia, L MCA stroke; pneumonia; R hemiparesis Due to bladder management, bowel management, safety, skin/wound care, disease management, medication administration, and patient education, does the patient require 24 hr/day rehab nursing? Yes Does the patient require coordinated care of a physician, rehab nurse, PT, OT, and SLP to address physical and functional deficits in  the context of the above medical diagnosis(es)? Yes Addressing deficits in the following areas: balance, endurance, locomotion, strength, transferring, bowel/bladder control, bathing, dressing, feeding, grooming, toileting, cognition, speech, and language Can the patient actively participate in an intensive therapy program of at least 3 hrs of therapy 5 days a week? Yes The potential for patient to make measurable gains while on inpatient rehab is good Anticipated functional outcomes upon discharge from inpatient rehab: supervision and min assist PT, supervision and min assist OT, supervision and min assist SLP Estimated rehab length of stay to reach the above functional goals is: 2-3 weeks Anticipated discharge destination: Home 10. Overall Rehab/Functional Prognosis: good     MD Signature:            Revision History                                  Note Details  Jan Fireman, MD File Time 05/12/2021 10:37 AM  Author Type Physician Status Signed  Last Editor Courtney Heys, MD Service Physical Medicine and Rehabilitation

## 2021-05-12 NOTE — TOC Transition Note (Signed)
Transition of Care [facility]) - CM/SW Discharge Note   Patient Details  Name: Peter Lucas MRN: [id number] Date of Birth: 06/23/1966  [facility] CM/SW Contact:  Kermit Balo, RN Phone Number: 05/12/2021, 10:25 AM   Clinical Narrative:    Patient is discharging to CIR today. CM signing off.   Final next level of care: IP Rehab Facility Barriers to Discharge: No Barriers Identified   Patient Goals and CMS Choice        Discharge Placement                       Discharge Plan and Services                                     Social Determinants of Health (SDOH) Interventions     Readmission Risk Interventions No flowsheet data found.
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
## 2021-05-12 NOTE — Progress Notes (Signed)
Inpatient Rehabilitation Admissions Coordinator   I have insurance approval and CIR bed to admit patient today. I contacted daughter and sister with patient at bedside, Dr. Pearlean Brownie by phone as well as acute team and TOC. I will make the arrangements to admit today.  Ottie Glazier, RN, MSN Rehab Admissions Coordinator [phone] 05/12/2021 10:30 AM

## 2021-05-12 NOTE — Progress Notes (Signed)
Discharged to 4W rm 13 after report given to [facility].  Transported in w/c, accompanied by RN. Family at his side and aware of the transfer.

## 2021-05-12 NOTE — Plan of Care (Signed)
  Problem: Safety: Goal: Non-violent Restraint(s) Outcome: Adequate for Discharge   Problem: Education: Goal: Knowledge of disease or condition will improve Outcome: Adequate for Discharge Goal: Knowledge of secondary prevention will improve Outcome: Adequate for Discharge Goal: Knowledge of patient specific risk factors addressed and post discharge goals established will improve Outcome: Adequate for Discharge Goal: Individualized Educational Video(s) Outcome: Adequate for Discharge   Problem: Self-Care: Goal: Ability to participate in self-care as condition permits will improve Outcome: Adequate for Discharge Goal: Ability to communicate needs accurately will improve Outcome: Adequate for Discharge   Problem: Nutrition: Goal: Risk of aspiration will decrease Outcome: Adequate for Discharge Goal: Dietary intake will improve Outcome: Adequate for Discharge   Problem: Ischemic Stroke/TIA Tissue Perfusion: Goal: Complications of ischemic stroke/TIA will be minimized Outcome: Adequate for Discharge

## 2021-05-12 NOTE — Progress Notes (Signed)
INPATIENT REHABILITATION ADMISSION NOTE   Arrival Method: bed     Mental Orientation: Alert. Aphasic.    Assessment: done   Skin: done   IV'S: none   Pain: none   Tubes and Drains: done   Safety Measures: reviewed with pt and family   Vital Signs: done   Height and Weight: done   Rehab Orientation: done   Family: at bedside.       Notes:

## 2021-05-12 NOTE — Progress Notes (Signed)
Modified Barium Swallow Progress Note  Patient Details  Name: Peter Lucas MRN: [id number] Date of Birth: 10-23-1966  Today's Date: 05/12/2021  Modified Barium Swallow completed.  Full report located under Chart Review in the Imaging Section.  Brief recommendations include the following:  Clinical Impression  Patient presents with normal oropharyngeal swallow ability.  He self fed with all intake except pudding and cracker.  Mastication, oral transiting and pharyngeal motility WFL.  Recommend regular/thin diet - medication as tolerated.  No SLP follow up regarding swallowing.   Swallow Evaluation Recommendations       SLP Diet Recommendations: Regular solids;Thin liquid   Liquid Administration via: Cup;Straw   Medication Administration: Other (Comment) (as tolerated)   Supervision: Patient able to self feed   Compensations: Slow rate;Small sips/bites;Minimize environmental distractions       Oral Care Recommendations: Oral care BID       Rolena Infante, MS [facility] SLP Acute Rehab Services Office [phone] Pager [phone]  Chales Abrahams 05/12/2021,8:45 AM

## 2021-05-12 NOTE — Discharge Instructions (Addendum)
Inpatient Rehab Discharge Instructions  Peter Lucas Discharge date and time: No discharge date for patient encounter.   Activities/Precautions/ Functional Status: Activity: activity as tolerated Diet: diabetic diet Wound Care: Routine skin checks Functional status:  ___ No restrictions     ___ Walk up steps independently ___ 24/7 supervision/assistance   ___ Walk up steps with assistance ___ Intermittent supervision/assistance  ___ Bathe/dress independently ___ Walk with walker     __x_ Bathe/dress with assistance ___ Walk Independently    ___ Shower independently ___ Walk with assistance    ___ Shower with assistance ___ No alcohol     ___ Return to work/school ________  COMMUNITY REFERRALS UPON DISCHARGE:    Home Health:   PT     OT     ST                Agency:[facility] [phone]      Special Instructions: No driving smoking or alcoholSTROKE/TIA DISCHARGE INSTRUCTIONS   Continue aspirin 81 mg daily and Plavix 75 mg daily x90 days total and then aspirin alone beginning 08/08/2021   SMOKING Cigarette smoking nearly doubles your risk of having a stroke & is the single most alterable risk factor  If you smoke or have smoked in the last 12 months, you are advised to quit smoking for your health. Most of the excess cardiovascular risk related to smoking disappears within a year of stopping. Ask you doctor about anti-smoking medications [place] Quit Line: 1-800-QUIT NOW Free Smoking Cessation Classes [phone]  CHOLESTEROL Know your levels; limit fat & cholesterol in your diet  Lipid Panel     Component Value Date/Time   CHOL 135 05/08/2021 0412   TRIG 200 (H) 05/10/2021 1151   HDL 20 (L) 05/08/2021 0412   CHOLHDL 6.8 05/08/2021 0412   VLDL 29 05/08/2021 0412   LDLCALC 86 05/08/2021 0412     Many patients benefit from treatment even if their cholesterol is at goal. Goal: Total Cholesterol (CHOL) less than 160 Goal:  Triglycerides (TRIG) less than  150 Goal:  HDL greater than 40 Goal:  LDL (LDLCALC) less than 100   BLOOD PRESSURE American Stroke Association blood pressure target is less that 120/80 mm/Hg  Your discharge blood pressure is:    Monitor your blood pressure Limit your salt and alcohol intake Many individuals will require more than one medication for high blood pressure  DIABETES (A1c is a blood sugar average for last 3 months) Goal HGBA1c is under 7% (HBGA1c is blood sugar average for last 3 months)  Diabetes:   Lab Results  Component Value Date   HGBA1C 10.1 (H) 05/08/2021    Your HGBA1c can be lowered with medications, healthy diet, and exercise. Check your blood sugar as directed by your physician Call your physician if you experience unexplained or low blood sugars.  PHYSICAL ACTIVITY/REHABILITATION Goal is 30 minutes at least 4 days per week  Activity: Increase activity slowly, Therapies: Physical Therapy: Home Health Return to work:  Activity decreases your risk of heart attack and stroke and makes your heart stronger.  It helps control your weight and blood pressure; helps you relax and can improve your mood. Participate in a regular exercise program. Talk with your doctor about the best form of exercise for you (dancing, walking, swimming, cycling).  DIET/WEIGHT Goal is to maintain a healthy weight  Your discharge diet is:  Diet Order             Diet heart  healthy/carb modified Room service appropriate? Yes with Assist; Fluid consistency: Thin  Diet effective now                   liquids Your height is:    Your current weight is:   Your Body Mass Index (BMI) is:    Following the type of diet specifically designed for you will help prevent another stroke. Your goal weight range is:   Your goal Body Mass Index (BMI) is 19-24. Healthy food habits can help reduce 3 risk factors for stroke:  High cholesterol, hypertension, and excess weight.  RESOURCES Stroke/Support Group:  Call [phone]    STROKE EDUCATION PROVIDED/REVIEWED AND GIVEN TO PATIENT Stroke warning signs and symptoms How to activate emergency medical system (call 911). Medications prescribed at discharge. Need for follow-up after discharge. Personal risk factors for stroke. Pneumonia vaccine given:  Flu vaccine given:  My questions have been answered, the writing is legible, and I understand these instructions.  I will adhere to these goals & educational materials that have been provided to me after my discharge from the hospital.       My questions have been answered and I understand these instructions. I will adhere to these goals and the provided educational materials after my discharge from the hospital.  Patient/Caregiver Signature _______________________________ Date __________  Clinician Signature _______________________________________ Date __________  Please bring this form and your medication list with you to all your follow-up doctor's appointments.

## 2021-05-13 LAB — CBC WITH DIFFERENTIAL/PLATELET
Abs Immature Granulocytes: 0.05 10*3/uL (ref 0.00–0.07)
Basophils Absolute: 0 10*3/uL (ref 0.0–0.1)
Basophils Relative: 0 %
Eosinophils Absolute: 0.1 10*3/uL (ref 0.0–0.5)
Eosinophils Relative: 1 %
HCT: 44.1 % (ref 39.0–52.0)
Hemoglobin: 15.1 g/dL (ref 13.0–17.0)
Immature Granulocytes: 1 %
Lymphocytes Relative: 11 %
Lymphs Abs: 1 10*3/uL (ref 0.7–4.0)
MCH: 28.3 pg (ref 26.0–34.0)
MCHC: 34.2 g/dL (ref 30.0–36.0)
MCV: 82.6 fL (ref 80.0–100.0)
Monocytes Absolute: 0.9 10*3/uL (ref 0.1–1.0)
Monocytes Relative: 9 %
Neutro Abs: 7.3 10*3/uL (ref 1.7–7.7)
Neutrophils Relative %: 78 %
Platelets: 243 10*3/uL (ref 150–400)
RBC: 5.34 MIL/uL (ref 4.22–5.81)
RDW: 13.8 % (ref 11.5–15.5)
WBC: 9.3 10*3/uL (ref 4.0–10.5)
nRBC: 0 % (ref 0.0–0.2)

## 2021-05-13 LAB — GLUCOSE, CAPILLARY
Glucose-Capillary: 142 mg/dL — ABNORMAL HIGH (ref 70–99)
Glucose-Capillary: 165 mg/dL — ABNORMAL HIGH (ref 70–99)
Glucose-Capillary: 141 mg/dL — ABNORMAL HIGH (ref 70–99)
Glucose-Capillary: 146 mg/dL — ABNORMAL HIGH (ref 70–99)
Glucose-Capillary: 161 mg/dL — ABNORMAL HIGH (ref 70–99)

## 2021-05-13 LAB — COMPREHENSIVE METABOLIC PANEL
ALT: 54 U/L — ABNORMAL HIGH (ref 0–44)
AST: 37 U/L (ref 15–41)
Albumin: 2.9 g/dL — ABNORMAL LOW (ref 3.5–5.0)
Alkaline Phosphatase: 87 U/L (ref 38–126)
Anion gap: 13 (ref 5–15)
BUN: 37 mg/dL — ABNORMAL HIGH (ref 6–20)
CO2: 21 mmol/L — ABNORMAL LOW (ref 22–32)
Calcium: 9.5 mg/dL (ref 8.9–10.3)
Chloride: 102 mmol/L (ref 98–111)
Creatinine, Ser: 1.37 mg/dL — ABNORMAL HIGH (ref 0.61–1.24)
GFR, Estimated: >60 mL/min (ref >60)
Glucose, Bld: 157 mg/dL — ABNORMAL HIGH (ref 70–99)
Potassium: 3.3 mmol/L — ABNORMAL LOW (ref 3.5–5.1)
Sodium: 136 mmol/L (ref 135–145)
Total Bilirubin: 1.2 mg/dL (ref 0.3–1.2)
Total Protein: 7.4 g/dL (ref 6.5–8.1)

## 2021-05-13 MED ORDER — POTASSIUM CHLORIDE CRYS ER 20 MEQ PO TBCR
20.0000 meq | EXTENDED_RELEASE_TABLET | Freq: Every day | ORAL | Status: DC
  Administered 2021-05-13 – 2021-05-18 (×6): 20 meq via ORAL
  Filled 2021-05-13 (×5): qty 1

## 2021-05-13 MED ORDER — CLONIDINE HCL 0.1 MG PO TABS
0.3000 mg | ORAL_TABLET | Freq: Three times a day (TID) | ORAL | Status: DC
  Administered 2021-05-13 – 2021-05-26 (×39): 0.3 mg via ORAL
  Filled 2021-05-13 (×41): qty 3

## 2021-05-13 MED ORDER — HYDROCHLOROTHIAZIDE 10 MG/ML ORAL SUSPENSION
6.2500 mg | Freq: Every day | ORAL | Status: DC
  Administered 2021-05-14 – 2021-05-16 (×3): 6.25 mg via ORAL
  Filled 2021-05-13 (×3): qty 1.25

## 2021-05-13 MED ORDER — SODIUM CHLORIDE 0.9 % IV BOLUS
500.0000 mL | Freq: Once | INTRAVENOUS | Status: AC
  Administered 2021-05-13: 500 mL via INTRAVENOUS

## 2021-05-13 MED ORDER — SODIUM CHLORIDE 0.9 % IV SOLN
INTRAVENOUS | Status: DC | PRN
  Administered 2021-05-13 – 2021-05-14 (×2): 30 mL via INTRAVENOUS
  Administered 2021-05-15: 250 mL via INTRAVENOUS

## 2021-05-13 NOTE — Evaluation (Signed)
Speech Language Pathology Assessment and Plan  Patient Details  Name: Peter Lucas MRN: [id number] Date of Birth: 06/25/1966  SLP Diagnosis: Aphasia;Cognitive Impairments;Apraxia  Rehab Potential: Fair ELOS: 2.5 weeks    Today's Date: 05/13/2021 SLP Individual Time: 1100-1150 SLP Individual Time Calculation (min): 50 min   Hospital Problem: Principal Problem:   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 (HCC)  Past Medical History: No past medical history on file. Past Surgical History:  Past Surgical History:  Procedure Laterality Date   BACK SURGERY     cyst removal   IR CT HEAD LTD  05/07/2021   IR PERCUTANEOUS ART THROMBECTOMY/INFUSION INTRACRANIAL INC DIAG ANGIO  05/07/2021   RADIOLOGY WITH ANESTHESIA N/A 05/07/2021   Procedure: IR WITH ANESTHESIA;  Surgeon: Radiologist, Medication, MD;  Location: [facility];  Service: Radiology;  Laterality: N/A;    Assessment / Plan / Recommendation  Peter Lucas is a 55-year-old right-handed male with unremarkable past medical history who has not seen a PCP for many years as well as history of tobacco use, obesity with BMI 34.28.  He is on no prescription medications.  Per chart review patient lives alone.  1 level home 3 steps to entry.  Independent prior to admission working at [employer].  He has good family support with multiple family members in the area.  Presented 05/07/2021 with acute onset of aphasia and right side weakness.  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  No hemorrhagic transformation.  Patient did not receive tPA.  CTA of head and neck positive for occlusion of the posterior left MCA branch, distal M2 versus proximal M3.  Patient underwent thrombectomy per interventional radiology.  Latest cranial CT scan showed more pronounced low density within the left MCA branch vessel infarction.  No midline shift or hemorrhage.  Echocardiogram ejection fraction of 55 to 60%  no wall motion abnormalities.  Admission chemistries unremarkable except glucose 185 alcohol negative WBC 11,100.  Patient did require initial intubation after thrombectomy was ultimately extubated but became hypoxic requiring reintubation with final extubation 05/09/2021.  He did spike a low-grade fever respiratory cultures grew gram-positive cocci in pairs and chains currently maintained on Rocephin 05/10/2021 x5 to 7 days.  Currently maintained on aspirin as well as Plavix for CVA prophylaxis.  Subcutaneous Lovenox for DVT prophylaxis.  Close monitoring of blood pressure initially on Cleviprex and since discontinued.  Findings of elevated hemoglobin A1c 10.1 with sliding scale insulin added. .  Patient was advanced to a regular consistency diet.  Therapy evaluations completed due to patient's decreased functional mobility and aphasia was admitted for a comprehensive rehab program.  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 He requires max-total A to imitate pointing gesture but unable to follow any verbal directions, unable to demonstrate comprehension of any basic objects in field of two. He does verbalize with some phrases however only one time was able to communicate via verbal, non-verbal and SLP determination that he needed to use bathroom. He is also able to produce greetings, "thank you", "bye", etc. Otherwise, patient's verbal production is not meaningful. Patient required hand over hand at maximal A level to localize finger to point to major body parts on face. He imitated to raise hand and legs. He appeared to be fixated on showing/demonstrating how he is able to move both LE and UE and was not sufficiently attentive to or comprehending   SLP's verbal and gestural cues. When SLP held spoon and cup in front of patient and cued to point to specific item, patient opened and closed his hands, gestured to  wall and his feet but did not attend to objects in visual field. Patient is currently requiring a significant amount of cues, assistance and supervision for basic safety and communication and will require SLP at CIR to improve.  Skilled Therapeutic Interventions          SLE  SLP Assessment  Patient will need skilled [place] [facility] during CIR admission    Recommendations  Patient destination: Home Follow up Recommendations: Outpatient SLP;Home Health SLP [facility] if unable to have Outpatient) Equipment Recommended: None recommended by SLP    SLP Frequency 3 to 5 out of 7 days   SLP Duration  SLP Intensity  SLP Treatment/Interventions 2.5 weeks  Minumum of 1-2 x/day, 30 to 90 minutes  Cognitive remediation/compensation;Cueing hierarchy;Environmental controls;Speech/Language facilitation;Internal/external aids;Patient/family education;Functional tasks    Pain Pain Assessment Pain Scale: Faces Faces Pain Scale: No hurt  Prior Functioning Cognitive/Linguistic Baseline: Within functional limits Type of Home: House  Lives With: Alone Available Help at Discharge: Family;Available 24 hours/day Vocation: Full time employment  SLP Evaluation Cognition Overall Cognitive Status: Impaired/Different from baseline Arousal/Alertness: Awake/alert Orientation Level: Oriented to person Attention: Focused Focused Attention: Impaired Focused Attention Impairment: Verbal basic;Functional basic Sustained Attention: Impaired Sustained Attention Impairment: Verbal basic;Functional basic Memory:  (unable to assess due to severe-profound aphasia) Awareness: Impaired Awareness Impairment: Intellectual impairment Problem Solving: Impaired Problem Solving Impairment: Functional basic Behaviors: Impulsive Safety/Judgment: Impaired  Comprehension Auditory Comprehension Overall Auditory Comprehension: Impaired Yes/No Questions: Impaired Basic Biographical Questions: 0-25%  accurate Basic Immediate Environment Questions: 0-24% accurate Commands: Impaired One Step Basic Commands: 0-24% accurate Interfering Components: Attention EffectiveTechniques: Extra processing time;Repetition;Visual/Gestural cues Visual Recognition/Discrimination Discrimination: Not tested Reading Comprehension Reading Status: Impaired Word level: Impaired Sentence Level: Not tested Paragraph Level: Not tested Functional Environmental (signs, name badge): Not tested Expression Expression Primary Mode of Expression: Verbal Verbal Expression Overall Verbal Expression: Impaired Initiation: Impaired Level of Generative/Spontaneous Verbalization: Phrase;Word Repetition: Impaired Level of Impairment: Word level Naming: Impairment Responsive: 0-25% accurate (0%) Confrontation: Impaired (0%) Convergent: Not tested Divergent: Not tested Verbal Errors: Not aware of errors Pragmatics: Impairment Impairments: Abnormal affect Oral Motor Oral Motor/Sensory Function Overall Oral Motor/Sensory Function: Mild impairment Facial ROM: Reduced right;Suspected CN VII (facial) dysfunction Facial Symmetry: Abnormal symmetry right;Suspected CN VII (facial) dysfunction Facial Strength: Reduced right;Suspected CN VII (facial) dysfunction Motor Speech Overall Motor Speech: Appears within functional limits for tasks assessed Respiration: Within functional limits Phonation: Normal Resonance: Within functional limits Articulation: Within functional limitis Intelligibility: Intelligible Motor Planning: Impaired Level of Impairment: Phrase Motor Speech Errors: Unaware  Care Tool Care Tool Cognition Expression of Ideas and Wants Expression of Ideas and Wants: Frequent difficulty - frequently exhibits difficulty with expressing needs and ideas   Understanding Verbal and Non-Verbal Content Understanding Verbal and Non-Verbal Content: Rarely/never understands   Memory/Recall Ability *first 3 days  only Memory/Recall Ability *first 3 days only: None of the above were recalled        Short Term Goals: Week 1: SLP Short Term Goal 1 (Week 1): Patient will point to major body parts with maxA cues to localize finger to point and to imitate SLP. SLP Short Term Goal 2 (Week 1): Patient will attend to at least two objects placed in front of him and point to requested object with maxA. SLP Short Term Goal 3 (Week  1): Patient will perform basic level familiar ADL tasks (feeding self, brushing teeth, etc) with maxA for safety. SLP Short Term Goal 4 (Week 1): Patient will utilize verbal and non-verbal means to communicate basic immediate wants/needs with maxA cues. SLP Short Term Goal 5 (Week 1): Patient will imitate to perform basic novel task with maxA cues.  Refer to Care Plan for Long Term Goals  Recommendations for other services: None   Discharge Criteria: Patient will be discharged from SLP if patient refuses treatment 3 consecutive times without medical reason, if treatment goals not met, if there is a change in medical status, if patient makes no progress towards goals or if patient is discharged from hospital.  The above assessment, treatment plan, treatment alternatives and goals were discussed and mutually agreed upon: No family available/patient unable  John T. Preston, MA, [name] Speech Therapy  

## 2021-05-13 NOTE — Progress Notes (Signed)
Physical Therapy Session Note  Patient Details  Name: Peter Lucas MRN: [id number] Date of Birth: 02-26-1966  Today's Date: 05/13/2021 PT Individual Time: 1303-1330 PT Individual Time Calculation (min): 27 min   Short Term Goals: Week 1:  PT Short Term Goal 1 (Week 1): Pt will perform supine <> sit consistently with CGA. PT Short Term Goal 2 (Week 1): Pt will perform safe SPVT transfers with close supervision. PT Short Term Goal 3 (Week 1): Pt will ambulate safely for at least 150 ft using LRAD with good balance.    Skilled Therapeutic Interventions/Progress Updates:   Pt received supine in bed and agreeable to PT. Supine>sit transfer with min assist to initiate movement. Donning pants and shoes EOB with min assist to manage the RLE. Stand pivot transfer to [facility] with min assist. Gait training with LUE support on IV pole and RUE HHA. 48f x 2 with cues for visual scanning to the R. Hand over hand assist for use of IV pole for UE support and safety for RUE placement [facility] returned to room and performed stand pivot transfer to bed with min assist and moderate cues for safety yo manage the IV line. Sit>supine completed with min assist to initiate movement, and left supine in bed with call bell in reach and all needs met.         Therapy Documentation Precautions:  Precautions Precautions: Fall, Other (comment) Precaution Comments: SBP <180; globally aphasic Restrictions Weight Bearing Restrictions: No  Pain: Pain Assessment Pain Scale: Faces Faces Pain Scale: No hurt    Therapy/Group: Individual Therapy  ALorie Phenix6/09/2021, 6:13 PM

## 2021-05-13 NOTE — Progress Notes (Signed)
Inpatient Rehabilitation Care Coordinator Assessment and Plan Patient Details  Name: Peter Lucas MRN: [id number] Date of Birth: 11/20/1966  Today's Date: 05/13/2021  Hospital Problems: Principal Problem:   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 [facility])  Past Medical History: No past medical history on file. Past Surgical History:  Past Surgical History:  Procedure Laterality Date   BACK SURGERY     cyst removal   IR CT HEAD LTD  05/07/2021   IR PERCUTANEOUS ART THROMBECTOMY/INFUSION INTRACRANIAL INC DIAG ANGIO  05/07/2021   RADIOLOGY WITH ANESTHESIA N/A 05/07/2021   Procedure: IR WITH ANESTHESIA;  Surgeon: Radiologist, Medication, MD;  Location: [place];  Service: Radiology;  Laterality: N/A;   Social History:  reports that he has been smoking. He does not have any smokeless tobacco history on file. No history on file for alcohol use and drug use.  Family / Support Systems Marital Status: Single Children: Educational psychologist (Daughter), Scientist, water quality (son), Peter Lucas (Son) Anticipated Caregiver: Children and siblings ([place] main contact-Peter Lucas) Ability/Limitations of Caregiver: pt siblings to assist when children working Caregiver Availability: 24/7  Social History Preferred language: English Religion:  Read: Yes Write: Yes Legal History/Current Legal Issues: n/a Guardian/Conservator: in process   Abuse/Neglect Abuse/Neglect Assessment Can Be Completed: Yes Physical Abuse: Denies Verbal Abuse: Denies Sexual Abuse: Denies Exploitation of patient/patient's resources: Denies Self-Neglect: Denies  Emotional Status Recent Psychosocial Issues: n/a Psychiatric History: n/a Substance Abuse History: tobacco  Patient / Family Perceptions, Expectations & Goals Pt/Family understanding of illness & functional limitations: yes Premorbid pt/family roles/activities: independent Anticipated changes in roles/activities/participation: children able to assist Pt/family expectations/goals:  Supervision to [place]: None Premorbid Home Care/DME Agencies: None Transportation available at discharge: Family able to transport  Discharge Planning Living Arrangements: Alone Support Systems: Children Type of Residence: Private residence (3 steps to enter) Administrator, sports: Multimedia programmer (specify) ([employer]) Money Management: Patient Does the patient have any problems obtaining your medications?: No Home Management: Independent Patient/Family Preliminary Plans: Daughter able to assit Care Coordinator Barriers to Discharge: New diabetic Care Coordinator Anticipated Follow Up Needs: [place] Additional Notes/Comments: Daughter working on [employer] Expected length of stay: 2-3 Weeks  Clinical Impression SW met with pt, called daughter Peter Lucas). Introduced self, explained role and addressed questions or concerns. Daughter is pt's primary contact.Plans to discharge back home with children and siblings to assist with care. No additional questions or concerns, sw will continue to follow up  Dyanne Iha 05/13/2021, 12:12 PM

## 2021-05-13 NOTE — Evaluation (Signed)
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 Past Surgical History:  Past Surgical History:  Procedure Laterality Date   BACK SURGERY     cyst removal   IR CT HEAD LTD  05/07/2021   IR PERCUTANEOUS ART THROMBECTOMY/INFUSION INTRACRANIAL INC DIAG ANGIO  05/07/2021   RADIOLOGY WITH ANESTHESIA N/A 05/07/2021   Procedure: IR WITH ANESTHESIA;  Surgeon: Radiologist, Medication, MD;  Location: [place];  Service: Radiology;  Laterality: N/A;    Assessment & Plan Clinical Impression: Patient is a 55 yo male presenting 6/4 with dense aphasia and R side weakness. Imaging revealed L MCA infarct, and the pt is now s/p thrombectomy with revascularization with reocclusion. Attempted extubation 6/5, but required reintubation later that day due to hypoxia and worsening neuro exam. Extubated 6/6. Pt with no significant PMH on file other than heavy tobacco use, but does not consistently access medical care. Patient transferred to CIR on 05/12/2021 .    Patient currently requires min with basic self-care skills secondary to muscle weakness, impaired timing and sequencing, unbalanced muscle activation, and decreased coordination, and decreased initiation, decreased attention, decreased awareness, and decreased safety awareness.  Pt demo's R side inattention, impulsivity, and decreased ability to follow simple commands. Prior to hospitalization, patient  could complete ADLs and IADLs with independent .  Patient will benefit from skilled intervention to decrease level of assist with basic self-care skills prior to discharge home with care partner.  Anticipate patient will require 24 hour supervision and follow up home health.      OT Evaluation Precautions/Restrictions  Precautions Precautions: Fall;Other (comment) Precaution Comments: SBP <180; globally aphasic Restrictions Weight Bearing Restrictions: No General Family/Caregiver Present: Yes (daughter) Pain Pain Assessment Pain Scale: 0-10 Pain Score: 0-No pain Home Living/Prior Functioning Home Living Living Arrangements: Alone Available Help at Discharge: Family, Available 24 hours/day (children and siblings would provide 24/7 supervision) Type of Home: House Home Access: Stairs to enter [employer] of Steps: 3 Home Layout: One level Bathroom Shower/Tub: Multimedia programmer: Programmer, systems: Yes  Lives With: Alone IADL History Homemaking Responsibilities: Yes Meal Prep Responsibility: Primary Laundry Responsibility: Primary Cleaning Responsibility: Primary Occupation: Full time employment Type of Occupation: working at [employer] Prior Function Level of Independence: Independent with basic ADLs, Independent with transfers, Independent with gait, Independent with homemaking with ambulation  Able to Take Stairs?: Yes Driving: Yes Vocation: Full time employment Comments: works for Art therapist Baseline Vision/History: Wears glasses Wears Glasses: Reading only Vision Assessment?: Yes Eye Alignment: Impaired (comment) Tracking/Visual Pursuits: Impaired - to be further tested in functional context Perception  Perception: Impaired Inattention/Neglect: Does not attend to right side of body;Does not attend to right visual field Praxis Praxis: Impaired Cognition Overall Cognitive Status: Impaired/Different from  baseline Arousal/Alertness: Awake/alert Orientation Level: Nonverbal/unable to assess Memory:  (unable to accurately assess 2/2 aphasia) Attention: Sustained Sustained Attention: Impaired Sustained Attention Impairment: Functional basic Awareness: Impaired Awareness Impairment: Emergent impairment Problem Solving: Impaired Problem Solving Impairment: Functional basic Safety/Judgment: Impaired  Sensation Sensation Light Touch: Appears Intact Hot/Cold: Appears Intact Proprioception: Impaired by gross assessment Additional Comments: R side inattention/neglect Coordination Gross Motor Movements are Fluid and Coordinated: No Fine Motor Movements are Fluid and Coordinated: No Coordination and Movement Description: Pt inconsistent with following simple commands, demo's decreased coordination on R side Finger Nose Finger Test: unable to comprehend instruction Motor  Motor Motor: Abnormal postural alignment and control;Abnormal tone;Hemiplegia Motor - Skilled Clinical Observations: R side weakness  Trunk/Postural Assessment  Cervical Assessment Cervical Assessment: Exceptions to [place] (fwd head) Thoracic Assessment Thoracic Assessment: Exceptions to [facility] (rounded shoulders) Lumbar Assessment Lumbar Assessment: Within Functional Limits  Balance Balance Balance Assessed: Yes Static Sitting Balance Static Sitting - Balance Support: Feet supported Static Sitting - Level of Assistance: 5: Stand by assistance Static Standing Balance Static Standing - Balance Support: No upper extremity supported;During functional activity Static Standing - Level of Assistance: 5: Stand by assistance Extremity/Trunk Assessment RUE Assessment RUE Assessment: Exceptions to [facility] Passive Range of Motion (PROM) Comments: PROM WFL General Strength Comments: assessed during functional tasks as pt unable to follow instruction for MMT. overall demo's decreased strength in RUE as compared to LUE RUE Body System:  Neuro LUE Assessment LUE Assessment: Within Functional Limits  Care Tool Care Tool Self Care Eating   Eating Assist Level: Supervision/Verbal cueing    Oral Care    Oral Care Assist Level: Supervision/Verbal cueing    Bathing   Body parts bathed by patient: Right arm;Chest;Abdomen;Front perineal area;Buttocks;Right upper leg;Face;Left lower leg;Right lower leg;Left upper leg Body parts bathed by helper: Left arm   Assist Level: Minimal Assistance - Patient > 75%    Upper Body Dressing(including orthotics)   What is the patient wearing?: Pull over shirt   Assist Level: Minimal Assistance - Patient > 75%    Lower Body Dressing (excluding footwear)   What is the patient wearing?: Pants;Underwear/pull up Assist for lower body dressing: Minimal Assistance - Patient > 75%    Putting on/Taking off footwear   What is the patient wearing?: Non-skid slipper socks Assist for footwear: Moderate Assistance - Patient 50 - 74%       Care Tool Toileting Toileting activity   Assist for toileting: Moderate Assistance - Patient 50 - 74%     Care Tool Bed Mobility Roll left and right activity   Roll left and right assist level: Supervision/Verbal cueing    Sit to lying activity   Sit to lying assist level: Contact Guard/Touching assist    Lying to sitting edge of bed activity   Lying to sitting edge of bed assist level: Contact Guard/Touching assist     Care Tool Transfers Sit to stand transfer   Sit to stand assist level: Contact Guard/Touching assist    Chair/bed transfer   Chair/bed transfer assist level: Contact Guard/Touching assist     Toilet transfer   Assist Level: Minimal Assistance - Patient > 75%     Care Tool Cognition Expression of Ideas and Wants Expression of Ideas and Wants: Frequent difficulty - frequently exhibits difficulty with expressing needs and ideas   Understanding Verbal and Non-Verbal Content Understanding Verbal and Non-Verbal Content: Sometimes  understands - understands only basic conversations or simple, direct phrases. Frequently requires cues to understand   Memory/Recall Ability *first 3 days only Memory/Recall Ability *first 3 days only: That he or she is in a hospital/hospital unit    Refer to Care Plan for [place] 1 OT Short Term Goal  1 (Week 1): Pt will complete 3/3 toileting tasks with CGA on toilet with min cuing. OT Short Term Goal 2 (Week 1): Pt will bathe with CGA and min cuing to attend to R side of body. OT Short Term Goal 3 (Week 1): Pt will complete grooming standing sink side with CGA. OT Short Term Goal 4 (Week 1): Pt will complete LB dressing with CGA. OT Short Term Goal 5 (Week 1): Pt will accurately follow 1 step commands for increased safety awareness during ADLs.  Recommendations for other services: None    Skilled Therapeutic Intervention  Pt and daughter received in room and consented to OT eval and tx. Daughter assisted with PLOF and home environment questions as pt unable to accurately answer questions, including basic yes/no questions. Pt demo's severe R side inattention/neglect during evaluation, is impulsive and just stands up and starts walking in room with no device and demo's poor safety awareness. Due to pt's inconsistent ability to follow commands, safety is a big concern. Pt only required min A for ADLs, with mod cuing for initiation of tasks and to bathe/dress/attend to R side during entire ADL routine. After tx, pt helped back to bed and left with bedrails up, bed alarm on, and daughter at bedside with all needs met.    ADL ADL Eating: Supervision/safety Where Assessed-Eating: Chair Grooming: Minimal assistance;Moderate cueing Where Assessed-Grooming: Sitting at sink Upper Body Bathing: Moderate cueing;Minimal assistance Where Assessed-Upper Body Bathing: Sitting at sink Lower Body Bathing: Minimal assistance;Moderate cueing Where Assessed-Lower Body Bathing:  Sitting at sink Upper Body Dressing: Moderate cueing;Minimal assistance Where Assessed-Upper Body Dressing: Sitting at sink Lower Body Dressing: Minimal assistance Where Assessed-Lower Body Dressing: Sitting at sink;Standing at sink Toileting: Moderate assistance;Moderate cueing Where Assessed-Toileting: Glass blower/designer: Psychiatric nurse Method: Arts development officer: Energy manager: Not assessed Social research officer, government: Not assessed Mobility  Transfers Sit to Stand: Contact Guard/Touching assist   Discharge Criteria: Patient will be discharged from OT if patient refuses treatment 3 consecutive times without medical reason, if treatment goals not met, if there is a change in medical status, if patient makes no progress towards goals or if patient is discharged from hospital.  The above assessment, treatment plan, treatment alternatives and goals were discussed and mutually agreed upon: by patient and by family  [employer] 05/13/2021, 10:44 AM

## 2021-05-13 NOTE — Progress Notes (Signed)
Inpatient Rehabilitation Center Individual Statement of Services  Patient Name:  Peter Lucas  Date:  05/13/2021  Welcome to the Inpatient Rehabilitation Center.  Our goal is to provide you with an individualized program based on your diagnosis and situation, designed to meet your specific needs.  With this comprehensive rehabilitation program, you will be expected to participate in at least 3 hours of rehabilitation therapies Monday-Friday, with modified therapy programming on the weekends.  Your rehabilitation program will include the following services:  Physical Therapy (PT), Occupational Therapy (OT), Speech Therapy (ST), 24 hour per day rehabilitation nursing, Therapeutic Recreaction (TR), Neuropsychology, Care Coordinator, Rehabilitation Medicine, Nutrition Services, Pharmacy Services, and Other  Weekly team conferences will be held on Wednesdays to discuss your progress.  Your Inpatient Rehabilitation Care Coordinator will talk with you frequently to get your input and to update you on team discussions.  Team conferences with you and your family in attendance may also be held.  Expected length of stay: 2-3 Weeks   Overall anticipated outcome: Supervision to Min A  Depending on your progress and recovery, your program may change. Your Inpatient Rehabilitation Care Coordinator will coordinate services and will keep you informed of any changes. Your Inpatient Rehabilitation Care Coordinator's name and contact numbers are listed  below.  The following services may also be recommended but are not provided by the Inpatient Rehabilitation Center:   Home Health Rehabiltiation Services Outpatient Rehabilitation Services    Arrangements will be made to provide these services after discharge if needed.  Arrangements include referral to agencies that provide these services.  Your insurance has been verified to be:  [employer] Your primary doctor is:  NO PCP  Pertinent information will be  shared with your doctor and your insurance company.  Inpatient Rehabilitation Care Coordinator:  Lavera Guise, [place] [phone] or [phone]  Information discussed with and copy given to patient by: Andria Rhein, 05/13/2021, 10:28 AM

## 2021-05-13 NOTE — IPOC Note (Addendum)
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 [facility])  Hospital Problems: Principal Problem:   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 ([facility])     Functional Problem List: Nursing Bowel, Bladder, Endurance, Medication Management, Safety, Pain  PT Balance, Behavior, Edema, Endurance, Motor, Pain, Safety, Sensory, Perception, Other (comment)  OT Balance, Safety, Cognition, Endurance, Motor, Nutrition, Vision, Perception  SLP Behavior, Safety, Linguistic, Motor, Cognition, Perception  TR         Basic ADL's: OT Grooming, Bathing, Dressing, Toileting     Advanced  ADL's: OT Simple Meal Preparation     Transfers: PT Bed Mobility, Bed to Chair, Car  OT Toilet, Tub/Shower     Locomotion: PT Ambulation, Psychologist, prison and probation services, Stairs     Additional Impairments: OT Fuctional Use of Upper Extremity  SLP Social Cognition, Communication comprehension, expression Social Interaction, Problem Solving, Attention, Awareness  TR      Anticipated Outcomes Item Anticipated Outcome  Self Feeding supervision  Swallowing  N/A   Basic self-care  supervision  Toileting  supervision   Bathroom Transfers supervision  Bowel/Bladder  Manage bowel with mod I assist and bladder with min assist  Transfers  supervision overall  Locomotion  supervision overall  Communication  modA basic multimodal expression, modA basic comprehension  Cognition  modA basic safety/problem solving  Pain  pain at or below level 4  Safety/Judgment  maintain safety with cues/reminders   Therapy Plan: PT Intensity: Minimum of 1-2 x/day ,45 to 90 minutes PT Frequency: 5 out of 7 days PT Duration Estimated Length of Stay: 2 - 2.5 weeks OT Intensity: Minimum of 1-2 x/day, 45 to 90 minutes OT Frequency: 5 out of 7 days OT Duration/Estimated Length of Stay: 2-2.5  weeks SLP Intensity: Minumum of 1-2 x/day, 30 to 90 minutes SLP Frequency: 3 to 5 out of 7 days SLP Duration/Estimated Length of Stay: 2.5 weeks   Due to the current state of emergency, patients may not be receiving their 3-hours of Medicare-mandated therapy.   Team Interventions: Nursing Interventions Bladder Management, Medication Management, Pain Management, Bowel Management, Patient/Family Education, Discharge Planning  PT interventions Ambulation/gait training, Warden/ranger, Cognitive remediation/compensation, Community reintegration, Disease management/prevention, Discharge planning, DME/adaptive equipment instruction, Functional mobility training, Neuromuscular re-education, Pain management, Patient/family education, Psychosocial support, Stair training, Splinting/orthotics, Therapeutic Activities, UE/LE Strength taining/ROM, UE/LE Coordination activities, Therapeutic Exercise, Visual/perceptual remediation/compensation, Wheelchair propulsion/positioning  OT Interventions Balance/vestibular training, Cognitive remediation/compensation, Discharge planning, DME/adaptive equipment instruction, Functional mobility training, Neuromuscular re-education, Patient/family education, Self Care/advanced ADL retraining, Therapeutic Activities, Therapeutic Exercise, UE/LE Strength taining/ROM, Visual/perceptual remediation/compensation, UE/LE Coordination activities  SLP Interventions Cognitive remediation/compensation, Cueing hierarchy, Environmental controls, Speech/Language facilitation, Internal/external aids, Patient/family education, Functional tasks  TR Interventions    SW/CM Interventions Discharge Planning, Psychosocial Support, Patient/Family Education, Disease Management/Prevention   Barriers to Discharge MD  Medical stability  Nursing Decreased caregiver support, Home environment access/layout, Incontinence, New diabetic 1 level 3 ste; children to assist at discharge  PT  Inaccessible home environment, Insurance for SNF coverage, Weight, Weight bearing restrictions, Medication compliance, Behavior global aphasia  OT Home environment access/layout, Other (comments) decreased safety awareness and cognitive deficits, globally aphasic  SLP Other (comments) 24 hour supervision and assistance will be required for long term  SW New diabetic     Team Discharge Planning: Destination: PT-Home ,OT- Home , SLP-Home Projected Follow-up: PT-Home health PT, 24 hour supervision/assistance, OT-  Home health OT, 24  hour supervision/assistance, SLP-Outpatient SLP, Home Health SLP (HH if unable to have Outpatient) Projected Equipment Needs: PT-To be determined, OT- 3 in 1 bedside comode, To be determined, SLP-None recommended by SLP Equipment Details: PT- , OT-  Patient/family involved in discharge planning: PT- Patient, Family member/caregiver,  OT-Family member/caregiver, Patient, SLP-Patient unable/family or caregive not available  MD ELOS: 14-18d Medical Rehab Prognosis:  Fair Assessment: 55 year old right-handed male with unremarkable past medical history who has not seen a PCP for many years as well as history of tobacco use, obesity with BMI 34.28.  He is on no prescription medications.  Per chart review patient lives alone.  1 level home 3 steps to entry.  Independent prior to admission working at [employer].  He has good family support with multiple family members in the area.  Presented 05/07/2021 with acute onset of aphasia and right side weakness.  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  No hemorrhagic transformation.  Patient did not receive tPA.  CTA of head and neck positive for occlusion of the posterior left MCA branch, distal M2 versus proximal M3.  Patient underwent thrombectomy per interventional radiology.  Latest cranial CT scan showed more pronounced low density within the left MCA branch vessel infarction.   No midline shift or hemorrhage.  Echocardiogram ejection fraction of 55 to 60% no wall motion abnormalities.  Admission chemistries unremarkable except glucose 185 alcohol negative WBC 11,100.  Patient did require initial intubation after thrombectomy was ultimately extubated but became hypoxic requiring reintubation with final extubation 05/09/2021.  He did spike a low-grade fever respiratory cultures grew gram-positive cocci in pairs and chains currently maintained on Rocephin 05/10/2021 x5 to 7 days.  Currently maintained on aspirin as well as Plavix for CVA prophylaxis.  Subcutaneous Lovenox for DVT prophylaxis.  Close monitoring of blood pressure initially on Cleviprex and since discontinued.  Findings of elevated hemoglobin A1c 10.1 with sliding scale insulin added. .  Patient was advanced to a regular consistency diet.  Therapy evaluations completed due to patient's decreased functional mobility and aphasia was admitted for a comprehensive rehab program.    Now requiring 24/7 Rehab RN,MD, as well as CIR level PT, OT and SLP.  Treatment team will focus on ADLs and mobility with goals set at supervision See Team Conference Notes for weekly updates to the plan of care

## 2021-05-13 NOTE — Progress Notes (Signed)
Inpatient Rehabilitation  Patient information reviewed and entered into eRehab system by Samuella Rasool Nikita Humble, OTR/L.   Information including medical coding, functional ability and quality indicators will be reviewed and updated through discharge.    

## 2021-05-13 NOTE — Evaluation (Signed)
Physical Therapy Assessment and Plan  Patient Details  Name: Peter Lucas MRN: [id number] Date of Birth: 1966-09-06  PT Diagnosis: Cognitive deficits, Coordination disorder, Difficulty walking, Hemiparesis dominant, Impaired cognition, and Muscle weakness Rehab Potential: Good ELOS: 2 - 2.5 weeks   Today's Date: 05/13/2021 PT Individual Time:  7846-9629  PT Individual Time Calculation: 70 min     Hospital Problem: Principal Problem:   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 ([place])   Past Medical History: No past medical history on file. Past Surgical History:  Past Surgical History:  Procedure Laterality Date   BACK SURGERY     cyst removal   IR CT HEAD LTD  05/07/2021   IR PERCUTANEOUS ART THROMBECTOMY/INFUSION INTRACRANIAL INC DIAG ANGIO  05/07/2021   RADIOLOGY WITH ANESTHESIA N/A 05/07/2021   Procedure: IR WITH ANESTHESIA;  Surgeon: Radiologist, Medication, MD;  Location: [place];  Service: Radiology;  Laterality: N/A;    Assessment & Plan Clinical Impression: Patient is a 55 y.o. right-handed male with unremarkable past medical history who has not seen a PCP for many years as well as history of tobacco use, obesity with BMI 34.28.  He is on no prescription medications.  Per chart review patient lives alone.  1 level home 3 steps to entry.  Independent prior to admission working at [employer].  He has good family support with multiple family members in the area.  Presented 05/07/2021 with acute onset of aphasia and right side weakness.  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  No hemorrhagic transformation.  Patient did not receive tPA.  CTA of head and neck positive for occlusion of the posterior left MCA branch, distal M2 versus proximal M3.  Patient underwent thrombectomy per interventional radiology.  Latest cranial CT scan showed more pronounced low density within the left MCA branch vessel infarction.  No midline  shift or hemorrhage.  Echocardiogram ejection fraction of 55 to 60% no wall motion abnormalities.  Admission chemistries unremarkable except glucose 185 alcohol negative WBC 11,100.  Patient did require initial intubation after thrombectomy was ultimately extubated but became hypoxic requiring reintubation with final extubation 05/09/2021.  He did spike a low-grade fever respiratory cultures grew gram-positive cocci in pairs and chains currently maintained on Rocephin 05/10/2021 x5 to 7 days.  Currently maintained on aspirin as well as Plavix for CVA prophylaxis.  Subcutaneous Lovenox for DVT prophylaxis.  Close monitoring of blood pressure initially on Cleviprex and since discontinued.  Findings of elevated hemoglobin A1c 10.1 with sliding scale insulin added. .  Patient was advanced to a regular consistency diet.  Therapy evaluations completed due to patient's decreased functional mobility and aphasia was admitted for a comprehensive rehab program.  Patient transferred to CIR on 05/12/2021 .   Patient currently requires min with mobility secondary to muscle weakness, decreased cardiorespiratoy endurance, unbalanced muscle activation, decreased coordination, and decreased motor planning, decreased attention to right and decreased motor planning, decreased attention, decreased awareness, decreased problem solving, decreased safety awareness, decreased memory, and delayed processing, and decreased standing balance, hemiplegia, and decreased balance strategies.  Prior to hospitalization, patient was independent  with mobility and lived with Alone in a House home.  Home access is 3Stairs to enter.  Patient will benefit from skilled PT intervention to maximize safe functional mobility, minimize fall risk, and decrease caregiver burden for planned discharge home with 24 hour assist.  Anticipate patient will benefit from follow up [facility] at discharge.  PT - End  of Session Activity Tolerance: Tolerates 10 - 20 min activity  with multiple rests Endurance Deficit: Yes Endurance Deficit Description: pt requires seated rest breaks during tasks PT Assessment Rehab Potential (ACUTE/IP ONLY): Good PT Barriers to Discharge: [place] home environment;Insurance for SNF coverage;Weight;Weight bearing restrictions;Medication compliance;Behavior PT Barriers to Discharge Comments: global aphasia PT Patient demonstrates impairments in the following area(s): Balance;Behavior;Edema;Endurance;Motor;Pain;Safety;Sensory;Perception;Other (comment) PT Transfers Functional Problem(s): Bed Mobility;Bed to Chair;Car PT Locomotion Functional Problem(s): Ambulation;Wheelchair Mobility;Stairs PT Plan PT Intensity: Minimum of 1-2 x/day ,45 to 90 minutes PT Frequency: 5 out of 7 days PT Duration Estimated Length of Stay: 2 - 2.5 weeks PT Treatment/Interventions: Ambulation/gait training;Balance/vestibular training;Cognitive remediation/compensation;Community reintegration;Disease management/prevention;Discharge planning;DME/adaptive equipment instruction;Functional mobility training;Neuromuscular re-education;Pain management;Patient/family education;Psychosocial support;Stair training;Splinting/orthotics;Therapeutic Activities;UE/LE Strength taining/ROM;UE/LE Coordination activities;Therapeutic Exercise;Visual/perceptual remediation/compensation;Wheelchair propulsion/positioning PT Transfers Anticipated Outcome(s): supervision overall PT Locomotion Anticipated Outcome(s): supervision overall PT Recommendation Follow Up Recommendations: Home health PT;24 hour supervision/assistance Patient destination: Home Equipment Recommended: To be determined   PT Evaluation Precautions/Restrictions Precautions Precautions: Fall;Other (comment) Precaution Comments: globally aphasic, R hemi UE> LE, R inattention Restrictions Weight Bearing Restrictions: No General   Vital SignsTherapy Vitals Temp: 98.3 F (36.8 C) Temp Source: Oral Pulse  Rate: (!) 58 Resp: 18 BP: (!) 155/89 Patient Position (if appropriate): Lying Oxygen Therapy SpO2: 94 % O2 Device: Room Air Pain Pain Assessment Pain Scale: Faces Faces Pain Scale: No hurt Home Living/Prior Functioning Home Living Available Help at Discharge: Family;Available 24 hours/day Type of Home: House Home Access: Stairs to enter [employer] of Steps: 3 Entrance Stairs-Rails: Right (wall to L) Home Layout: One level Bathroom Shower/Tub: Multimedia programmer: Standard Bathroom Accessibility: Yes  Lives With: Alone Prior Function Level of Independence: Independent with basic ADLs;Independent with homemaking with ambulation;Independent with gait;Independent with transfers  Able to Take Stairs?: Yes Driving: Yes Vocation: Full time employment Vocation Requirements: 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 Comments: Dance movement psychotherapist for [employer] Vision/Perception  Perception Perception: Impaired Inattention/Neglect: Does not attend to right side of body;Does not attend to right visual field Praxis Praxis: Impaired  Cognition Overall Cognitive Status: Impaired/Different from baseline Arousal/Alertness: Awake/alert Orientation Level: Disoriented X4 Attention: Focused;Sustained Focused Attention: Impaired Sustained Attention: Impaired Memory: Impaired (unable to formally assess d/t global aphasia) Awareness: Impaired Problem Solving: Impaired Behaviors: Restless;Impulsive Safety/Judgment: Impaired Sensation Sensation Light Touch: Appears Intact Additional Comments: R side inattention/neglect Coordination Gross Motor Movements are Fluid and Coordinated: No Fine Motor Movements are Fluid and Coordinated: No Coordination and Movement Description: Pt inconsistent with following simple commands, demo's decreased coordination on R side UE>LE Heel Shin Test: unable to follow instruction d/t global aphasia Motor   Motor Motor: Hemiplegia;Motor apraxia Motor - Skilled Clinical Observations: R side weakness UE>LE   Trunk/Postural Assessment  Cervical Assessment Cervical Assessment: Exceptions to [facility] (forward head) Thoracic Assessment Thoracic Assessment: Exceptions to [facility] (rounded shoulders) Lumbar Assessment Lumbar Assessment: Within Functional Limits Postural Control Postural Control: Within Functional Limits  Balance Balance Balance Assessed: Yes Static Sitting Balance Static Sitting - Balance Support: Feet supported Static Sitting - Level of Assistance: 5: Stand by assistance Dynamic Sitting Balance Dynamic Sitting - Balance Support: Feet supported;During functional activity Dynamic Sitting - Level of Assistance: 5: Stand by assistance Dynamic Sitting - Balance Activities: Reaching for objects;Reaching across midline Static Standing Balance Static Standing - Balance Support: During functional activity;Bilateral upper extremity supported Static Standing - Level of Assistance: 5: Stand by assistance Dynamic Standing Balance Dynamic Standing - Balance Support: During functional activity;Bilateral upper  extremity supported Dynamic Standing - Level of Assistance: 4: Min assist Dynamic Standing - Balance Activities: Reaching for weighted objects;Reaching across midline;Reaching for objects Dynamic Standing - Comments: functionally weaker to R side with decreased coordination Extremity Assessment      RLE Strength RLE Overall Strength: Deficits (difficult to assess d/t global aphasia) RLE Overall Strength Comments: R hip grossly 4-/ 5, knee grossly 4+/ 5, ankle grossly 4-/ 5 LLE Assessment LLE Assessment: Exceptions to [facility] (difficult to assess d/t global aphasia) General Strength Comments: grossly 4+ to 5/5  Care Tool Care Tool Bed Mobility Roll left and right activity   Roll left and right assist level: Supervision/Verbal cueing    Sit to lying activity   Sit to lying assist level:  Minimal Assistance - Patient > 75%    Lying to sitting edge of bed activity   Lying to sitting edge of bed assist level: Minimal Assistance - Patient > 75%     Care Tool Transfers Sit to stand transfer   Sit to stand assist level: Contact Guard/Touching assist    Chair/bed transfer   Chair/bed transfer assist level: Minimal Assistance - Patient > 75%     Toilet transfer   Assist Level: Minimal Assistance - Patient > 75%    Car transfer   Car transfer assist level: Contact Guard/Touching assist      Care Tool Locomotion Ambulation   Assist level: Minimal Assistance - Patient > 75% Assistive device: Walker-rolling Max distance: 175 ft  Walk 10 feet activity   Assist level: Contact Guard/Touching assist Assistive device: No Device (HHA)   Walk 50 feet with 2 turns activity   Assist level: Minimal Assistance - Patient > 75% Assistive device: Walker-rolling  Walk 150 feet activity   Assist level: Minimal Assistance - Patient > 75% Assistive device: Walker-rolling  Walk 10 feet on uneven surfaces activity Walk 10 feet on uneven surfaces activity did not occur: Safety/medical concerns      Stairs   Assist level: Minimal Assistance - Patient > 75% Stairs assistive device: 2 hand rails    Walk up/down 1 step activity   Walk up/down 1 step (curb) assist level: Contact Guard/Touching assist Walk up/down 1 step or curb assistive device: 2 hand rails    Walk up/down 4 steps activity Walk up/down 4 steps assist level: Moderate Assistance - Patient - 50 - 74%    Walk up/down 12 steps activity Walk up/down 12 steps activity did not occur: Safety/medical concerns      Pick up small objects from floor   Pick up small object from the floor assist level: Moderate Assistance - Patient 50 - 74% Pick up small object from the floor assistive device: n/a  Wheelchair Will patient use wheelchair at discharge?: No Type of Wheelchair: Manual Wheelchair activity did not occur:  Safety/medical concerns      Wheel 50 feet with 2 turns activity Wheelchair 50 feet with 2 turns activity did not occur: Safety/medical concerns    Wheel 150 feet activity Wheelchair 150 feet activity did not occur: Safety/medical concerns      Refer to Care Plan for Long Term Goals  SHORT TERM GOAL WEEK 1 PT Short Term Goal 1 (Week 1): Pt will perform supine <> sit consistently with CGA. PT Short Term Goal 2 (Week 1): Pt will perform safe SPVT transfers with close supervision. PT Short Term Goal 3 (Week 1): Pt will ambulate safely for at least 150 ft using LRAD with good balance.  Recommendations for  other services: None   Skilled Therapeutic Intervention Mobility Bed Mobility Bed Mobility: Supine to Sit;Sit to Supine Supine to Sit: Moderate Assistance - Patient 50-74% Sit to Supine: Minimal Assistance - Patient > 75% Transfers Transfers: Sit to Stand;Stand Pivot Transfers;Stand to Sit Sit to Stand: Minimal Assistance - Patient > 75% Stand to Sit: Contact Guard/Touching assist Stand Pivot Transfers: Minimal Assistance - Patient > 75% Stand Pivot Transfer Details: Verbal cues for technique;Verbal cues for precautions/safety;Verbal cues for safe use of DME/AE Transfer (Assistive device): Rolling walker Locomotion  Gait Ambulation: Yes Gait Assistance: Minimal Assistance - Patient > 75% Gait Distance (Feet): 175 Feet Assistive device: Rolling walker Gait Assistance Details: Tactile cues for posture;Tactile cues for placement;Verbal cues for technique;Verbal cues for precautions/safety;Verbal cues for safe use of DME/AE Gait Gait: Yes Gait Pattern: Decreased step length - right;Decreased weight shift to right  PT Evaluation completed; see above for results. PT educated patient in roles of PT vs OT, PT POC, rehab potential, rehab goals, and discharge recommendations along with recommendation for follow-up rehabilitation services. Individual treatment initiated:  Patient supine  in bed upon PT arrival. Daughter, [name], present.  Patient alert and agreeable to PT session. No pain complaint during session. Significant expressive and receptive aphasia present with initial perseveration on word "okay". Is not able to relate name or even recognize name when provided with name options.   Therapeutic Activity: Bed Mobility: With simple cue to sit on sie of bed, pt attempts to scoot to foot of bed and exit L side of bed where rail is raised. Patient responds initially to daughter's cues to go to other side of bed. Performed supine --> sit with Min A to complete push of UB to seated position. Provided vc/ tc for technique. At end of session, pt enters bed crawling forward onto LLE and then completes lying down and scooting to middle of bed.  Transfers: Patient performed STS to RW with min A for power up. Performs SPVT throughout session with discard of RW and use of armrest.  Provided vc/ tc for technique throughout with pt providing quizzical expression on face.   Gait Training:  Patient ambulated 175' x1 feet using RW with Min A for balance, walker mgmt as RUE falls from walker intermittently with no regard from pt. Ambulated with slight decrease in stance time on RLE. Easily distracted visually by patients in rooms, pictures on walls, activity in gym. Provided vc for maintaining good proximity to walker with BUE support, maintaining straight path, attn to task.  Despite visual and verbal demonstration of good technique for entering vehicle, pt forms car transfer in gym by stepping LLE into footwell first and then sliding laterally into seat with BUE support on car frame and door frame. When correcting pt midway into transfer, pt looks back with slight irritation and "What?" Exits car with hand support on car door despite vc not to use car door and physical block of car door. Attempts to walk past w/c despite vc/tc to sit in chair. Requires standing in front of pt and requesting sit to  w/c with pt directed visually to w/c.   Pt completes four 6" steps using BHR and CGA and reciprocal step pattern to ascend, then RLE descending while guarding R knee during descent.   Neuromuscular Re-ed: NMR facilitated during session with focus on seated then standing balance. Pt guided in reach for objects using RUE, then LUE. Requires visual demonstration, then physical, hand-over-hand guidance to complete. Sitting balance good with ability  to reach to floor and overhead as well as cross body all outside of BOS. Static standing  and reaching task with CGA. Dynamic reaching requires Min A to maintain standing balance. NMR performed for improvements in motor control and coordination, balance, sequencing, judgement, and self confidence/ efficacy in performing all aspects of mobility at highest level of independence.   Patient supine in bed at end of session with brakes locked, bed alarm set, and all needs within reach.   Discharge Criteria: Patient will be discharged from PT if patient refuses treatment 3 consecutive times without medical reason, if treatment goals not met, if there is a change in medical status, if patient makes no progress towards goals or if patient is discharged from hospital.  The above assessment, treatment plan, treatment alternatives and goals were discussed and mutually agreed upon: by patient and by family  Alger Simons 05/13/2021, 9:52 PM

## 2021-05-13 NOTE — Progress Notes (Signed)
IVT consulted to assess PIV.  Pt is not currently available (in PT).  Primary RN, Wallace Cullens will have resource on unit (charge/another RN)assess site with her and if necessary, will place a new consult for IVT to assess.

## 2021-05-13 NOTE — Progress Notes (Signed)
PROGRESS NOTE   Subjective/Complaints:  Severe receptive aphasia  ROS- cannot perform due to aphasia   Objective:   DG Swallowing Func-Speech Pathology  Result Date: 05/12/2021 Formatting of this result is different from the original. Objective Swallowing Evaluation: Type of Study: MBS-Modified Barium Swallow Study  Patient Details Name: Peter Lucas MRN: [id number] Date of Birth: [birth year] Today's Date: 05/12/2021 Time: SLP Start Time (ACUTE ONLY): 0801 -SLP Stop Time (ACUTE ONLY): 0818 SLP Time Calculation (min) (ACUTE ONLY): 17 min Past Medical History: No past medical history on file. Past Surgical History: Past Surgical History: Procedure Laterality Date  BACK SURGERY    cyst removal  IR CT HEAD LTD  05/07/2021  IR PERCUTANEOUS ART THROMBECTOMY/INFUSION INTRACRANIAL INC DIAG ANGIO  05/07/2021  RADIOLOGY WITH ANESTHESIA N/A 05/07/2021  Procedure: IR WITH ANESTHESIA;  Surgeon: Radiologist, Medication, MD;  Location: [facility] OR;  Service: Radiology;  Laterality: N/A; HPI: Pt is a 55 y/o male who presented on 6/4 with aphasia. 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 ETT 6/4-6/5; reintubated 6/5-6/6 worsening respiratory distress due to flash pulmonary edema. PMH: heavy tobacco use.  Subjective: pt awake in swallow function chair Assessment / Plan / Recommendation CHL IP CLINICAL IMPRESSIONS 05/12/2021 Clinical Impression Patient presents with normal oropharyngeal swallow ability.  He self fed with all intake except pudding and cracker.  Mastication, oral transiting and pharyngeal motility WFL.  Recommend regular/thin diet - medication as tolerated.  No SLP follow up regarding swallowing. SLP Visit Diagnosis Dysphagia, unspecified (R13.10) Attention and concentration deficit following -- Frontal lobe and executive function  deficit following -- Impact on safety and function Mild aspiration risk   CHL IP TREATMENT RECOMMENDATION 05/09/2021 Treatment Recommendations Therapy as outlined in treatment plan below   Prognosis 05/12/2021 Prognosis for Safe Diet Advancement Good Barriers to Reach Goals Language deficits;Other (Comment) Barriers/Prognosis Comment -- CHL IP DIET RECOMMENDATION 05/12/2021 SLP Diet Recommendations Regular solids;Thin liquid Liquid Administration via Cup;Straw Medication Administration Other (Comment) Compensations Slow rate;Small sips/bites;Minimize environmental distractions Postural Changes --   CHL IP OTHER RECOMMENDATIONS 05/12/2021 Recommended Consults -- Oral Care Recommendations Oral care BID Other Recommendations --   CHL IP FOLLOW UP RECOMMENDATIONS 05/12/2021 Follow up Recommendations Inpatient Rehab   CHL IP FREQUENCY AND DURATION 05/10/2021 Speech Therapy Frequency (ACUTE ONLY) min 2x/week Treatment Duration --      CHL IP ORAL PHASE 05/12/2021 Oral Phase WFL Oral - Pudding Teaspoon -- Oral - Pudding Cup -- Oral - Honey Teaspoon -- Oral - Honey Cup -- Oral - Nectar Teaspoon NT Oral - Nectar Cup WFL Oral - Nectar Straw WFL Oral - Thin Teaspoon WFL Oral - Thin Cup WFL Oral - Thin Straw WFL Oral - Puree WFL Oral - Mech Soft WFL Oral - Regular -- Oral - Multi-Consistency -- Oral - Pill -- Oral Phase - Comment --  CHL IP PHARYNGEAL PHASE 05/12/2021 Pharyngeal Phase WFL Pharyngeal- Pudding Teaspoon -- Pharyngeal -- Pharyngeal- Pudding Cup -- Pharyngeal -- Pharyngeal- Honey Teaspoon -- Pharyngeal -- Pharyngeal- Honey Cup -- Pharyngeal -- Pharyngeal- Nectar Teaspoon -- Pharyngeal -- Pharyngeal- Nectar Cup [facility]  Pharyngeal -- Pharyngeal- Nectar Straw WFL Pharyngeal -- Pharyngeal- Thin Teaspoon WFL Pharyngeal -- Pharyngeal- Thin Cup [facility] Pharyngeal -- Pharyngeal- Thin Straw WFL Pharyngeal -- Pharyngeal- Puree WFL Pharyngeal -- Pharyngeal- Mechanical Soft WFL Pharyngeal -- Pharyngeal- Regular -- Pharyngeal -- Pharyngeal-  Multi-consistency -- Pharyngeal -- Pharyngeal- Pill -- Pharyngeal -- Pharyngeal Comment --  CHL IP CERVICAL ESOPHAGEAL PHASE 05/12/2021 Cervical Esophageal Phase WFL Pudding Teaspoon -- Pudding Cup -- Honey Teaspoon -- Honey Cup -- Nectar Teaspoon -- Nectar Cup -- Nectar Straw -- Thin Teaspoon -- Thin Cup -- Thin Straw -- Puree -- Mechanical Soft -- Regular -- Multi-consistency -- Pill -- Cervical Esophageal Comment -- Rolena Infante, MS [facility] SLP Acute Rehab Services Office [phone] Pager [phone] Chales Abrahams 05/12/2021, 8:45 AM              Recent Labs    05/12/21 1534 05/13/21 0533  WBC 8.5 9.3  HGB 15.6 15.1  HCT 46.7 44.1  PLT 277 243   Recent Labs    05/11/21 0558 05/12/21 1534 05/13/21 0533  NA 138  --  136  K 3.2*  --  3.3*  CL 101  --  102  CO2 23  --  21*  GLUCOSE 163*  --  157*  BUN 28*  --  37*  CREATININE 1.34* 1.41* 1.37*  CALCIUM 8.8*  --  9.5   No intake or output data in the 24 hours ending 05/13/21 0816      Physical Exam: Vital Signs Blood pressure (!) 179/106, pulse (!) 54, temperature 97.7 F (36.5 C), resp. rate 20, height 6\' 3"  (1.905 m), weight 118.1 kg, SpO2 100 %.   General: No acute distress Mood and affect are appropriate Heart: Regular rate and rhythm no rubs murmurs or extra sounds Lungs: Clear to auscultation, breathing unlabored, no rales or wheezes Abdomen: Positive bowel sounds, soft nontender to palpation, nondistended Extremities: No clubbing, cyanosis, or edema Skin: No evidence of breakdown, no evidence of rash Neurologic: Cranial nerves II through XII intact, motor strength is 5/5 in left 4/5 RIght deltoid, bicep, tricep, grip, hip flexor, knee extensors, ankle dorsiflexor and plantar flexor Sensory exam ncannot assess due to aphasia Cerebellar exam difficulty following multistep command  Musculoskeletal: Full range of motion in all 4 extremities. No joint swelling   Assessment/Plan: 1. Functional deficits which require 3+  hours per day of interdisciplinary therapy in a comprehensive inpatient rehab setting. Physiatrist is providing close team supervision and 24 hour management of active medical problems listed below. Physiatrist and rehab team continue to assess barriers to discharge/monitor patient progress toward functional and medical goals  Care Tool:  Bathing              Bathing assist       Upper Body Dressing/Undressing Upper body dressing        Upper body assist      Lower Body Dressing/Undressing Lower body dressing      What is the patient wearing?: Hospital gown only, Incontinence brief     Lower body assist Assist for lower body dressing: 2 Helpers     Toileting Toileting    Toileting assist Assist for toileting: Moderate Assistance - Patient 50 - 74%     Transfers Chair/bed transfer  Transfers assist     Chair/bed transfer assist level: Moderate Assistance - Patient 50 - 74%     Locomotion Ambulation   Ambulation assist  Walk 10 feet activity   Assist           Walk 50 feet activity   Assist           Walk 150 feet activity   Assist           Walk 10 feet on uneven surface  activity   Assist           Wheelchair     Assist               Wheelchair 50 feet with 2 turns activity    Assist            Wheelchair 150 feet activity     Assist          Blood pressure (!) 179/106, pulse (!) 54, temperature 97.7 F (36.5 C), resp. rate 20, height 6\' 3"  (1.905 m), weight 118.1 kg, SpO2 100 %.   Medical Problem List and Plan: 1.  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  Status post thrombectomy.             -patient may shower             -ELOS/Goals: 2-3 weeks- supervision to min A 2.  Antithrombotics: -DVT/anticoagulation: Lovenox             -antiplatelet therapy: Aspirin 81 mg  daily and Plavix 75 mg daily x90 days then aspirin alone beginning 08/08/2021 3. Pain Management: Tylenol as needed 4. Mood: Ativan 0.5 mg every 4 hours as needed anxiety             -antipsychotic agents: N/A 5. Neuropsych: This patient is not capable of making decisions on his own behalf. 6. Skin/Wound Care: Routine skin checks 7. Fluids/Electrolytes/Nutrition: Routine in and outs with follow-up chemistries 8.  Dysphagia.  Carb modified nectar thick liquid.  And advance to a regular consistency 05/13/2021 follow-up speech therapy 9.  Acute respiratory failure with hypoxia and hypercapnia.  Gram-positive cocci pneumonia.  Ceftriaxone 05/10/2021 x 5 to 7 days.  Titrate oxygen as needed. 10.  New findings diabetes mellitus.  Hemoglobin A1c 10.1.  Currently on SSI.  Diabetic teaching 11.  Hypertension.  HCTZ 25 mg daily, clonidine 0.2 mg every 8 hours, Norvasc 10 mg daily, lisinopril 20 mg daily.  Monitor with increased mobility Vitals:   05/12/21 1950 05/13/21 0405  BP: (!) 174/101 (!) 179/106  Pulse: 61 (!) 54  Resp: 18 20  Temp: 97.8 F (36.6 C) 97.7 F (36.5 C)  SpO2: 94% 100%  Increase clonidine dose to .3mg  BID  12.  Hyperlipidemia.  Lipitor 13.  Obesity.  BMI 34.28.  Dietary follow-up 14.  Tobacco abuse.  NicoDerm patch.  Provide counseling 15.  Hypokalemia - will supplement with oral KCL 16.  Pre renal azotemia- likely due to reduced fluid intake plus diuretic, give IV saline bolus x 1 07/13/21 slow rate, reduce HCTZ dose  LOS: 1 days A FACE TO FACE EVALUATION WAS PERFORMED  05/13/2021, 8:16 AM

## 2021-05-13 NOTE — Progress Notes (Signed)
Patient ID: Peter Lucas, male   DOB: 1966/01/05, 55 y.o.   MRN: [id number] Met with the patient, daughter and sister to introduce self and role of the nurse CM. Patient pleasant with severe expressive aphasia; appears to understand most of what is being discussed and attempts to chime in however words to not make sense or answer questions and then he reports "yeah". Daughter and sister report understanding of information and are supportive of the patient's attempts to communicate. Given handouts on HTN, HLD, new diabetes/management and DAPTx 3 months then ASA solo per MD along with smoking cessation information. Patient is wearing a nicotine patch. Continue to follow along to discharge to address educational needs and collaborate with the the SW to facilitate preparation for discharge. Margarito Liner, RN

## 2021-05-14 DIAGNOSIS — R4701 Aphasia: Secondary | ICD-10-CM

## 2021-05-14 LAB — GLUCOSE, CAPILLARY
Glucose-Capillary: 123 mg/dL — ABNORMAL HIGH (ref 70–99)
Glucose-Capillary: 125 mg/dL — ABNORMAL HIGH (ref 70–99)
Glucose-Capillary: 133 mg/dL — ABNORMAL HIGH (ref 70–99)
Glucose-Capillary: 148 mg/dL — ABNORMAL HIGH (ref 70–99)
Glucose-Capillary: 149 mg/dL — ABNORMAL HIGH (ref 70–99)
Glucose-Capillary: 206 mg/dL — ABNORMAL HIGH (ref 70–99)

## 2021-05-14 MED ORDER — INSULIN ASPART 100 UNIT/ML IJ SOLN
0.0000 [IU] | Freq: Every day | INTRAMUSCULAR | Status: DC
  Administered 2021-05-15: 2 [IU] via SUBCUTANEOUS
  Administered 2021-05-18: 4 [IU] via SUBCUTANEOUS
  Administered 2021-05-19 – 2021-05-23 (×4): 2 [IU] via SUBCUTANEOUS

## 2021-05-14 MED ORDER — INSULIN ASPART 100 UNIT/ML IJ SOLN
0.0000 [IU] | Freq: Three times a day (TID) | INTRAMUSCULAR | Status: DC
  Administered 2021-05-15: 3 [IU] via SUBCUTANEOUS
  Administered 2021-05-15 – 2021-05-16 (×3): 2 [IU] via SUBCUTANEOUS
  Administered 2021-05-16 – 2021-05-17 (×3): 3 [IU] via SUBCUTANEOUS
  Administered 2021-05-18 (×2): 2 [IU] via SUBCUTANEOUS
  Administered 2021-05-18 – 2021-05-19 (×2): 3 [IU] via SUBCUTANEOUS
  Administered 2021-05-19: 2 [IU] via SUBCUTANEOUS
  Administered 2021-05-19: 3 [IU] via SUBCUTANEOUS
  Administered 2021-05-20 – 2021-05-21 (×5): 2 [IU] via SUBCUTANEOUS
  Administered 2021-05-21: 3 [IU] via SUBCUTANEOUS
  Administered 2021-05-22: 5 [IU] via SUBCUTANEOUS
  Administered 2021-05-22: 3 [IU] via SUBCUTANEOUS
  Administered 2021-05-23 (×2): 2 [IU] via SUBCUTANEOUS
  Administered 2021-05-23 – 2021-05-24 (×2): 3 [IU] via SUBCUTANEOUS
  Administered 2021-05-24: 2 [IU] via SUBCUTANEOUS
  Administered 2021-05-25 – 2021-05-26 (×2): 3 [IU] via SUBCUTANEOUS

## 2021-05-14 NOTE — Progress Notes (Signed)
Physical Therapy Session Note  Patient Details  Name: Peter Lucas MRN: [id number] Date of Birth: Apr 22, 1966  Today's Date: 05/14/2021 PT Individual Time: 0802-0900 PT Individual Time Calculation (min): 58 min   Short Term Goals: Week 1:  PT Short Term Goal 1 (Week 1): Pt will perform supine <> sit consistently with CGA. PT Short Term Goal 2 (Week 1): Pt will perform safe SPVT transfers with close supervision. PT Short Term Goal 3 (Week 1): Pt will ambulate safely for at least 150 ft using LRAD with good balance.  Skilled Therapeutic Interventions/Progress Updates:   Pt received supine in bed and agreeable to PT. Son present throughout session. Supine>sit transfer with min assist to initiation of movement. Donning shoes EOB.   Gait training in rehab gym with CGA and no AD x 172f. Tactile cues for improved safety awareness and navigation of obstacles on the R.  Dynamic gait training. To weave through 5 cones x 6 with min assist from PT and verbal/visual, and tactile cues for awareness of cones on the R side. Stepping over canes on the floor 4 x 4 with cues for step length on the LLE and improved step height with the RLE.   BITS visual scanning x 4 outs 1 min x2 and 2 min x 2 to locate target using the LUE. Hand over hand for first 2 bouts to touch target, then pt able to grasp instruction to complete the rest of task.   Standing balance training to perform lateral reach with RUE and toss bean bag to target with the RUE. Mod assist to initiate movement on the RUE as well as visual scanning to locate bean bags.   Pt returned to room and performed ambulatory transfer to bed with min A and no AD. Sit>supine completed with supervision assist for safety, and left supine in bed with call bell in reach and all needs met.           Therapy Documentation Precautions:  Precautions Precautions: Fall, Other (comment) Precaution Comments: globally aphasic, R hemi UE> LE, R  inattention Restrictions Weight Bearing Restrictions: No  Vital Signs: Therapy Vitals Temp: 98.1 F (36.7 C) Temp Source: Oral Pulse Rate: (!) 55 Resp: 17 BP: (!) 148/93 Patient Position (if appropriate): Lying Oxygen Therapy SpO2: 96 % O2 Device: Room Air Pain: Pain Assessment Pain Scale: 0-10 Pain Score: 0-No pain     Therapy/Group: Individual Therapy  ALorie Phenix6/10/2021, 9:10 AM

## 2021-05-14 NOTE — Plan of Care (Signed)
  Problem: RH Balance Goal: LTG Patient will maintain dynamic sitting balance (PT) Description: LTG:  Patient will maintain dynamic sitting balance with assistance during mobility activities (PT) Flowsheets (Taken 05/13/2021 1248) LTG: Pt will maintain dynamic sitting balance during mobility activities with:: Independent with assistive device  Goal: LTG Patient will maintain dynamic standing balance (PT) Description: LTG:  Patient will maintain dynamic standing balance with assistance during mobility activities (PT) Flowsheets (Taken 05/13/2021 1248) LTG: Pt will maintain dynamic standing balance during mobility activities with:: Supervision/Verbal cueing   Problem: Sit to Stand Goal: LTG:  Patient will perform sit to stand with assistance level (PT) Description: LTG:  Patient will perform sit to stand with assistance level (PT) Flowsheets (Taken 05/13/2021 1248) LTG: PT will perform sit to stand in preparation for functional mobility with assistance level: Supervision/Verbal cueing   Problem: RH Bed Mobility Goal: LTG Patient will perform bed mobility with assist (PT) Description: LTG: Patient will perform bed mobility with assistance, with/without cues (PT). Flowsheets (Taken 05/13/2021 1248) LTG: Pt will perform bed mobility with assistance level of: Independent with assistive device    Problem: RH Bed to Chair Transfers Goal: LTG Patient will perform bed/chair transfers w/assist (PT) Description: LTG: Patient will perform bed to chair transfers with assistance (PT). Flowsheets (Taken 05/13/2021 1248) LTG: Pt will perform Bed to Chair Transfers with assistance level: Supervision/Verbal cueing   Problem: RH Car Transfers Goal: LTG Patient will perform car transfers with assist (PT) Description: LTG: Patient will perform car transfers with assistance (PT). Flowsheets (Taken 05/13/2021 1248) LTG: Pt will perform car transfers with assist:: Supervision/Verbal cueing   Problem: RH Furniture  Transfers Goal: LTG Patient will perform furniture transfers w/assist (OT/PT) Description: LTG: Patient will perform furniture transfers  with assistance (OT/PT). Flowsheets (Taken 05/13/2021 1248) LTG: Pt will perform furniture transfers with assist:: Supervision/Verbal cueing   Problem: RH Ambulation Goal: LTG Patient will ambulate in controlled environment (PT) Description: LTG: Patient will ambulate in a controlled environment, # of feet with assistance (PT). Flowsheets (Taken 05/13/2021 1248) LTG: Pt will ambulate in controlled environ  assist needed:: Supervision/Verbal cueing LTG: Ambulation distance in controlled environment: >200 ft using LRAD Goal: LTG Patient will ambulate in home environment (PT) Description: LTG: Patient will ambulate in home environment, # of feet with assistance (PT). Flowsheets (Taken 05/13/2021 1248) LTG: Pt will ambulate in home environ  assist needed:: Supervision/Verbal cueing LTG: Ambulation distance in home environment: at least 50 ft using LRAD   Problem: RH Stairs Goal: LTG Patient will ambulate up and down stairs w/assist (PT) Description: LTG: Patient will ambulate up and down # of stairs with assistance (PT) Flowsheets (Taken 05/13/2021 1248) LTG: Pt will ambulate up/down stairs assist needed:: Supervision/Verbal cueing LTG: Pt will  ambulate up and down number of stairs: 4 steps using R HR and L wall support to ascend

## 2021-05-14 NOTE — Progress Notes (Signed)
Occupational Therapy Session Note  Patient Details  Name: Peter Lucas MRN: [id number] Date of Birth: May 02, 1966  Today's Date: 05/14/2021 OT Individual Time: 1003-1057 OT Individual Time Calculation (min): 54 min + 38 min   Short Term Goals: Week 1:  OT Short Term Goal 1 (Week 1): Pt will complete 3/3 toileting tasks with CGA on toilet with min cuing. OT Short Term Goal 2 (Week 1): Pt will bathe with CGA and min cuing to attend to R side of body. OT Short Term Goal 3 (Week 1): Pt will complete grooming standing sink side with CGA. OT Short Term Goal 4 (Week 1): Pt will complete LB dressing with CGA. OT Short Term Goal 5 (Week 1): Pt will accurately follow 1 step commands for increased safety awareness during ADLs.  Skilled Therapeutic Interventions/Progress Updates:    Session 1: Pt received semi-reclined in bed with son Peter Lucas present, no s/sx of pain throughout session, agreeable to therapy. Session focus on safety awareness, command follow, toileting, and RUE NMR in prep for improved ADL performance. Came to sitting EOB with close S, pt did wait momentarily for therapist cuing to stand up. But adamantly declining use of walker. Impulsively walking to the bathroom after extensive cuing to see if he needed to go, responded "I'll try." Despite max verbal and visual cues, pt unable to comprehend safe RW use and follow commands. Stood to urinate at toilet with CGA to min A for balance, little awareness of R foot being caught behind 3in1, becoming frustrated with therapists cuing to untangle foot and pushing RW aside. Amb to w/c and sat before brakes locked. Sitting at sink, presented with toothbrush + toothpaste. Pt squeezed significant amount of toothpaste out of tube without attempting to put on toothpaste, but did adequately brush teeth when set-up for him. W/c transport to and from gym 2/2 time management + energy conservation. Sat at BITS to complete 3 rounds of single target game, req max  verbal and demonstrational cuing to use RUE, but otherwise understood game play. Completed 1x10 of the following with 3 lb dowel rod + ace wrap to facilitate R grip: forward/backwards rows, chest press, shoulder press, and B shoulder flexion. Finally, completed massed practice of putty squeezes (attempted other exercises, but pt unable to comprehend instructions to accurately complete) + placed and remove velcro blocks. Amb transfer back to bed ~5 ft with no AD + CGA. Total A to doff B shoes.  Pt left with son present/telesitter on , 4 bed rails up, bed alarm engaged, call bell in reach, and all immediate needs met.    Session 2: Pt received semi-reclined in bed with son present, no s/sx pain, agreeable to therapy. Session focus on R attention + R NMR/FMC + command follow in prep for improved ADL performance. Came to sitting EOB close S and amb transfer to and from w/c with CGA + use of sink for steadying self. With 4 lb dumbbell, pt completed 2x10 shoulder press, shoulder flexions, biceps curls, and forearm pronation/supine with RUE. Req heavy visual and tactile cues to follow exercises. Additionally, participated in placing pegs in peg board, progress from max A to accurately place to only min A. Finally, pt practiced flipping over cards, however, despite max visual and verbal cues, pt unable to match them.   Pt left with son present/telesitter on , 4 bed rails up, bed alarm engaged, call bell in reach, and all immediate needs met.    Therapy Documentation Precautions:  Precautions Precautions: Fall,  Other (comment) Precaution Comments: globally aphasic, R hemi UE> Lucas, R inattention Restrictions Weight Bearing Restrictions: No  Pain: No s/sx   ADL: See Care Tool for more details.  Therapy/Group: Individual Therapy  Volanda Napoleon MS, OTR/L   05/14/2021, 6:35 AM

## 2021-05-14 NOTE — Progress Notes (Signed)
Physical Therapy Session Note  Patient Details  Name: Peter Lucas MRN: [id number] Date of Birth: August 08, 1966  Today's Date: 05/14/2021 PT Individual Time: 1617-1700 PT Individual Time Calculation (min): 43 min   Short Term Goals: Week 1:  PT Short Term Goal 1 (Week 1): Pt will perform supine <> sit consistently with CGA. PT Short Term Goal 2 (Week 1): Pt will perform safe SPVT transfers with close supervision. PT Short Term Goal 3 (Week 1): Pt will ambulate safely for at least 150 ft using LRAD with good balance.    Skilled Therapeutic Interventions/Progress Updates:    Pt sidelying in bed at time of session. Shoes donned while EOB, min manual assistance for L shoe positioning, max manual assistance for R shoe positioning and lacing shoes bilaterally.   Static balance performed in front of finger ladder while pt utilized RUE to grasp clothes pins placed on his right side and visually cued to position on varying heights of ladder. Pt required hand over hand cuing 85% of the exercise to facilitate movement/attention to the right and set up with pins placement. CGA to Min assistance to maintain balance and safety. Yellow, Red, and Green pins used x3 three rounds of placing and taking pins off ladder. Pt unable to understand instructions for feet together placement so red wedge was utilized for one round to enhance difficulty. Transitioned to a new exercise secondary to pt growing frustrated and becoming emotional.   Dynamic balance performed with stepping over obstacles. First, one BLE step over hurdle x6. Then, two hurdles and two rows of cones for enhanced difficulty  x2 rounds down and back. Pt demonstrated preference to step over obstacles with the LLE throughout, unable to understand verbal and tactile cue to step over with the RLE secondary to aphasia. Min A for safety and balance with verbal and visual cuing to increase RLE hip/knee flexion over obstacles throughout.   Side stepping at  railing 56ft x2 towards left and x2 towards right. Visual and verbal cuing for direction changes with CGA for balance and maintaining safety. Manual assistance to place and maintain RUE on railing.   No expressions or verbalizations of pain throughout session. Pt placed back in bed with call bell within reach.   Therapy Documentation Precautions:  Precautions Precautions: Fall, Other (comment) Precaution Comments: globally aphasic, R hemi UE> LE, R inattention Restrictions Weight Bearing Restrictions: No  Pain: Pain Assessment Pain Scale: Faces Faces Pain Scale: No hurt    Therapy/Group: Individual Therapy  Lyniah Fujita, SPT  05/14/2021, 5:50 PM

## 2021-05-14 NOTE — Progress Notes (Signed)
PROGRESS NOTE   Subjective/Complaints:  Pt got a telesitter, because was getting up to void/go to bathroom, but not allowed to- c/o thirst, not pain- LBM last night.     ROS- limited by aphasia- global aphasia  Objective:   No results found. Recent Labs    05/12/21 1534 05/13/21 0533  WBC 8.5 9.3  HGB 15.6 15.1  HCT 46.7 44.1  PLT 277 243   Recent Labs    05/12/21 1534 05/13/21 0533  NA  --  136  K  --  3.3*  CL  --  102  CO2  --  21*  GLUCOSE  --  157*  BUN  --  37*  CREATININE 1.41* 1.37*  CALCIUM  --  9.5    Intake/Output Summary (Last 24 hours) at 05/14/2021 1301 Last data filed at 05/14/2021 0830 Gross per 24 hour  Intake 377 ml  Output --  Net 377 ml        Physical Exam: Vital Signs Blood pressure 127/78, pulse (!) 57, temperature 98.1 F (36.7 C), temperature source Oral, resp. rate 17, height 6\' 3"  (1.905 m), weight 118.1 kg, SpO2 100 %.    General: awake, alert, appropriate, bariatric pt; sitting up in bed; son at bedside; receptive aphasia noted, NAD HENT: conjugate gaze; oropharynx moist CV: regular rhythm; bradycardic rate; ; no JVD Pulmonary: CTA B/L; no W/R/R- good air movement GI: soft, NT, ND, (+)BS; hypoactive Psychiatric: flat, frustrated Neurological: global/receptive>expressive aphasia.   Extremities: No clubbing, cyanosis, or edema Skin: No evidence of breakdown, no evidence of rash Neurologic: Cranial nerves II through XII intact, motor strength is 5/5 in left 4/5 RIght deltoid, bicep, tricep, grip, hip flexor, knee extensors, ankle dorsiflexor and plantar flexor Sensory exam ncannot assess due to aphasia Cerebellar exam difficulty following multistep command  Musculoskeletal: Full range of motion in all 4 extremities. No joint swelling   Assessment/Plan: 1. Functional deficits which require 3+ hours per day of interdisciplinary therapy in a comprehensive inpatient  rehab setting. Physiatrist is providing close team supervision and 24 hour management of active medical problems listed below. Physiatrist and rehab team continue to assess barriers to discharge/monitor patient progress toward functional and medical goals  Care Tool:  Bathing    Body parts bathed by patient: Right arm, Chest, Abdomen, Front perineal area, Buttocks, Right upper leg, Face, Left lower leg, Right lower leg, Left upper leg   Body parts bathed by helper: Left arm     Bathing assist Assist Level: Minimal Assistance - Patient > 75%     Upper Body Dressing/Undressing Upper body dressing   What is the patient wearing?: Pull over shirt    Upper body assist Assist Level: Minimal Assistance - Patient > 75%    Lower Body Dressing/Undressing Lower body dressing      What is the patient wearing?: Pants, Underwear/pull up     Lower body assist Assist for lower body dressing: Minimal Assistance - Patient > 75%     Toileting Toileting    Toileting assist Assist for toileting: Moderate Assistance - Patient 50 - 74%     Transfers Chair/bed transfer  Transfers assist     Chair/bed  transfer assist level: Minimal Assistance - Patient > 75%     Locomotion Ambulation   Ambulation assist      Assist level: Minimal Assistance - Patient > 75% Assistive device: Walker-rolling Max distance: 175 ft   Walk 10 feet activity   Assist     Assist level: Contact Guard/Touching assist Assistive device: No Device (HHA)   Walk 50 feet activity   Assist    Assist level: Minimal Assistance - Patient > 75% Assistive device: Walker-rolling    Walk 150 feet activity   Assist    Assist level: Minimal Assistance - Patient > 75% Assistive device: Walker-rolling    Walk 10 feet on uneven surface  activity   Assist Walk 10 feet on uneven surfaces activity did not occur: Safety/medical concerns         Wheelchair     Assist Will patient use wheelchair  at discharge?: No Type of Wheelchair: Manual Wheelchair activity did not occur: Safety/medical concerns         Wheelchair 50 feet with 2 turns activity    Assist    Wheelchair 50 feet with 2 turns activity did not occur: Safety/medical concerns       Wheelchair 150 feet activity     Assist  Wheelchair 150 feet activity did not occur: Safety/medical concerns       Blood pressure 127/78, pulse (!) 57, temperature 98.1 F (36.7 C), temperature source Oral, resp. rate 17, height 6\' 3"  (1.905 m), weight 118.1 kg, SpO2 100 %.   Medical Problem List and Plan: 1.  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  Status post thrombectomy.             -patient may shower             -ELOS/Goals: 2-3 weeks- supervision to min A  Con't PT, OT and SLP 2.  Antithrombotics: -DVT/anticoagulation: Lovenox             -antiplatelet therapy: Aspirin 81 mg daily and Plavix 75 mg daily x90 days then aspirin alone beginning 08/08/2021 3. Pain Management: Tylenol as needed 4. Mood: Ativan 0.5 mg every 4 hours as needed anxiety             -antipsychotic agents: N/A 5. Neuropsych: This patient is not capable of making decisions on his own behalf. 6. Skin/Wound Care: Routine skin checks 7. Fluids/Electrolytes/Nutrition: Routine in and outs with follow-up chemistries 8.  Dysphagia.  Carb modified nectar thick liquid.  And advance to a regular consistency 05/13/2021 follow-up speech therapy 9.  Acute respiratory failure with hypoxia and hypercapnia.  Gram-positive cocci pneumonia.  Ceftriaxone 05/10/2021 x 5 to 7 days.  Titrate oxygen as needed.  6/11- lung exam sounds good- no O2 requirements- con't IV ABX 10.  New findings diabetes mellitus.  Hemoglobin A1c 10.1.  Currently on SSI.  Diabetic teaching  6/11- BG's 125-161- with 1 episode of 206-(had just eaten) con't regimen for now 11.  Hypertension.   HCTZ 25 mg daily, clonidine 0.2 mg every 8 hours, Norvasc 10 mg daily, lisinopril 20 mg daily.  Monitor with increased mobility Vitals:   05/14/21 0755 05/14/21 1255  BP: (!) 148/93 127/78  Pulse: (!) 55 (!) 57  Resp: 17 17  Temp: 98.1 F (36.7 C)   SpO2: 96% 100%  Increase clonidine dose to .3mg  BID  6/11- BP borderline high- con't new regimen 12.  Hyperlipidemia.  Lipitor 13.  Obesity.  BMI 34.28.  Dietary follow-up 14.  Tobacco abuse.  NicoDerm patch.  Provide counseling 15.  Hypokalemia - will supplement with oral KCL 16.  Pre renal azotemia- likely due to reduced fluid intake plus diuretic, give IV saline bolus x 1 slow rate, reduce HCTZ dose   6/11- will recheck labs on Monday.   LOS: 2 days A FACE TO FACE EVALUATION WAS PERFORMED  Sakai Wolford 05/14/2021, 1:01 PM

## 2021-05-15 LAB — GLUCOSE, CAPILLARY
Glucose-Capillary: 157 mg/dL — ABNORMAL HIGH (ref 70–99)
Glucose-Capillary: 145 mg/dL — ABNORMAL HIGH (ref 70–99)
Glucose-Capillary: 139 mg/dL — ABNORMAL HIGH (ref 70–99)
Glucose-Capillary: 204 mg/dL — ABNORMAL HIGH (ref 70–99)

## 2021-05-16 LAB — BASIC METABOLIC PANEL
Anion gap: 11 (ref 5–15)
BUN: 41 mg/dL — ABNORMAL HIGH (ref 6–20)
CO2: 22 mmol/L (ref 22–32)
Calcium: 9.2 mg/dL (ref 8.9–10.3)
Chloride: 102 mmol/L (ref 98–111)
Creatinine, Ser: 1.48 mg/dL — ABNORMAL HIGH (ref 0.61–1.24)
GFR, Estimated: 55 mL/min — ABNORMAL LOW (ref >60)
Glucose, Bld: 155 mg/dL — ABNORMAL HIGH (ref 70–99)
Potassium: 3.5 mmol/L (ref 3.5–5.1)
Sodium: 135 mmol/L (ref 135–145)

## 2021-05-16 LAB — GLUCOSE, CAPILLARY
Glucose-Capillary: 159 mg/dL — ABNORMAL HIGH (ref 70–99)
Glucose-Capillary: 118 mg/dL — ABNORMAL HIGH (ref 70–99)
Glucose-Capillary: 151 mg/dL — ABNORMAL HIGH (ref 70–99)
Glucose-Capillary: 173 mg/dL — ABNORMAL HIGH (ref 70–99)

## 2021-05-16 MED ORDER — HYDRALAZINE HCL 10 MG PO TABS
10.0000 mg | ORAL_TABLET | Freq: Three times a day (TID) | ORAL | Status: DC
  Administered 2021-05-16 – 2021-05-26 (×30): 10 mg via ORAL
  Filled 2021-05-16 (×30): qty 1

## 2021-05-16 MED ORDER — LISINOPRIL 5 MG PO TABS
5.0000 mg | ORAL_TABLET | Freq: Every day | ORAL | Status: DC
  Administered 2021-05-17 – 2021-05-18 (×2): 5 mg via ORAL
  Filled 2021-05-16 (×2): qty 1

## 2021-05-16 NOTE — Progress Notes (Addendum)
Physical Therapy Session Note  Patient Details  Name: Peter Lucas MRN: [id number] Date of Birth: 11/26/1966  Today's Date: 05/16/2021 PT Individual Time: 8338-2505 and 3976-7341 PT Individual Time Calculation (min): 72 min and 35 min   Short Term Goals: Week 1:  PT Short Term Goal 1 (Week 1): Pt will perform supine <> sit consistently with CGA. PT Short Term Goal 2 (Week 1): Pt will perform safe SPVT transfers with close supervision. PT Short Term Goal 3 (Week 1): Pt will ambulate safely for at least 150 ft using LRAD with good balance.  Skilled Therapeutic Interventions/Progress Updates:  Session 1: Patient supine in bed with dtr present on entrance to room. Patient alert and agreeable to PT session. Patient denied pain during session. Continues to demonstrate global aphasia with minimal improvement. Unable to follow verbal instructions throughout session without addition of hand-over-hand assist for demonstration.  Therapeutic Activity: Bed Mobility: Patient performed supine <> sit with supervision requiring vc for bringing RLE to EOB. Pt indicates that he does not want to be yelled at by the bed to not get OOB. Assured pt that the bed alarm is off. While seated EOB, pt is able to don tennis shoes with extra time and is even able to tie laces with supervision requiring extra time to complete tie to R shoe while foot is on floor. Sitting balance good with no LOB throughout.   At end of session, pt climbs forward with L knee first into bed with supervision. Daughter relates that he has always gotten in bed this way.   Transfers: Patient performed STS transfers with close supervision and SPVT transfers with CGA for balance. Provided verbal/ visual cues for looking back to seat prior to descent to sit.   Gait Training:  Patient ambulated 155' x1/ 29' x1 with no AD and CGA. Maintains straight path throughout although unable to follow verbal instructions for directional changes. Provided vc/  tc for forward gaze as pt does get visually distracted. Intermittent crossover stepping demonstrated with no LOB.   Neuromuscular Re-ed: NMR facilitated during session with focus on dynamic standing balance and following verbal instructions. Pt guided in bean bag toss to target of 30 bags x2 bouts requiring CGA for balance. During first bout, pt requires about 6 tosses before he remembers to step for counter balance in throw and is able to improve toss. 2nd bout target moved to further distance. After each bout, pt instructed to pick up bags on the board and to count each bag as he picks them up. After first bout, he is able to count to four but then is unable to continue. When provided with cues and hints to next number, he is unable to retrieve correct number in sequence.   Pt also guided in match of bean bag color to corresponding color dot on tray table. He is unable to follow simple instruction for picking up bean bag to his R side. Unable to relate color of bean bag and wants to throw bean bag to tray table instead of attempting to match color to color despite hand-over-hand demonstration. Also retrieves 0/6 correct color bean bags when specific color requested. NMR performed for improvements in motor control and coordination, balance, sequencing, judgement, and self confidence/ efficacy in performing all aspects of mobility at highest level of independence.   Patient semireclined in bed at end of session with brakes locked, bed alarm set, and all needs within reach. Reminded not to get OOB without supervision from staff.  Session 2:  Patient supine in bed with son present on entrance to room. Son works in [facility] ED and is intermittently able to visit during day. Patient alert and agreeable to PT session. Patient denied pain during session.  Therapeutic Activity: Bed Mobility: Patient performed supine <> sit with supervision to L side of bed despite rail up on that side. No vc required. Pt is  able to don and tie shoes requiring extra time and 2 attempts for R shoe as that foot is on the floor.  Transfers: Patient performed STS with CGA/ close supervision to no AD and SPVT transfers with CGA and no AD. Additional level of assist required for more dynamic movements d/t decline in standing balance. Provided verbal cues for technique and especially to look back for position at seat prior to descent to sit.   Pt transported outside with son in w/c for time.   Gait Training:  Patient ambulated >350 feet over uneven outdoor paved terrain with CGA and no AD. Demonstrates intermittent crossover stepping with no waver in balance and 2 instances of bumping into large trash cans on R side. No LOB. Consistent vc/visual cues required for directionality. Pt is also able to step up onto curb step and uses RLE to step up on curb despite cues for LLE. Also demonstrates good control of speed and effort with ambulating up and down ramped paved entrance.  On return to room, patient climbs forward into bed with L knee first with supervision. He is semireclined  in bed at end of session with brakes locked, bed alarm set, and all needs within reach. Handed off to ST for session.    Therapy Documentation Precautions:  Precautions Precautions: Fall, Other (comment) Precaution Comments: globally aphasic, R hemi UE> LE, R inattention Restrictions Weight Bearing Restrictions: No  Therapy/Group: Individual Therapy  Loel Dubonnet PT, DPT 05/16/2021, 9:35 AM

## 2021-05-16 NOTE — Progress Notes (Signed)
PROGRESS NOTE   Subjective/Complaints:  Working with SLP who notes Left gaze preference , pt   ROS- limited by aphasia- receptive aphasia  Objective:   No results found. No results for input(s): WBC, HGB, HCT, PLT in the last 72 hours.  Recent Labs    05/16/21 0552  NA 135  K 3.5  CL 102  CO2 22  GLUCOSE 155*  BUN 41*  CREATININE 1.48*  CALCIUM 9.2     Intake/Output Summary (Last 24 hours) at 05/16/2021 1103 Last data filed at 05/15/2021 1845 Gross per 24 hour  Intake 200 ml  Output --  Net 200 ml         Physical Exam: Vital Signs Blood pressure (!) 151/89, pulse (!) 47, temperature 97.6 F (36.4 C), temperature source Oral, resp. rate 18, height 6\' 3"  (1.905 m), weight 118.1 kg, SpO2 95 %.  General: No acute distress Mood and affect are appropriate Heart: Regular rate and rhythm no rubs murmurs or extra sounds Lungs: Clear to auscultation, breathing unlabored, no rales or wheezes Abdomen: Positive bowel sounds, soft nontender to palpation, nondistended  Extremities: No clubbing, cyanosis, or edema Skin: No evidence of breakdown, no evidence of rash Neurologic: Cranial nerves II through XII intact, motor strength is 5/5 in left 4/5 RIght deltoid, bicep, tricep, grip, hip flexor, knee extensors, ankle dorsiflexor and plantar flexor Sensory exam ncannot assess due to aphasia Cerebellar exam difficulty following multistep command  Musculoskeletal: Full range of motion in all 4 extremities. No joint swelling   Assessment/Plan: 1. Functional deficits which require 3+ hours per day of interdisciplinary therapy in a comprehensive inpatient rehab setting. Physiatrist is providing close team supervision and 24 hour management of active medical problems listed below. Physiatrist and rehab team continue to assess barriers to discharge/monitor patient progress toward functional and medical goals  Care  Tool:  Bathing    Body parts bathed by patient: Right arm, Chest, Abdomen, Front perineal area, Buttocks, Right upper leg, Face, Left lower leg, Right lower leg, Left upper leg   Body parts bathed by helper: Left arm     Bathing assist Assist Level: Minimal Assistance - Patient > 75%     Upper Body Dressing/Undressing Upper body dressing   What is the patient wearing?: Pull over shirt    Upper body assist Assist Level: Minimal Assistance - Patient > 75%    Lower Body Dressing/Undressing Lower body dressing      What is the patient wearing?: Pants, Underwear/pull up     Lower body assist Assist for lower body dressing: Minimal Assistance - Patient > 75%     Toileting Toileting    Toileting assist Assist for toileting: Contact Guard/Touching assist     Transfers Chair/bed transfer  Transfers assist     Chair/bed transfer assist level: Contact Guard/Touching assist     Locomotion Ambulation   Ambulation assist      Assist level: Minimal Assistance - Patient > 75% Assistive device: Walker-rolling Max distance: 175 ft   Walk 10 feet activity   Assist     Assist level: Contact Guard/Touching assist Assistive device: No Device (HHA)   Walk 50 feet activity  Assist    Assist level: Minimal Assistance - Patient > 75% Assistive device: Walker-rolling    Walk 150 feet activity   Assist    Assist level: Minimal Assistance - Patient > 75% Assistive device: Walker-rolling    Walk 10 feet on uneven surface  activity   Assist Walk 10 feet on uneven surfaces activity did not occur: Safety/medical concerns         Wheelchair     Assist Will patient use wheelchair at discharge?: No Type of Wheelchair: Manual Wheelchair activity did not occur: Safety/medical concerns         Wheelchair 50 feet with 2 turns activity    Assist    Wheelchair 50 feet with 2 turns activity did not occur: Safety/medical concerns        Wheelchair 150 feet activity     Assist  Wheelchair 150 feet activity did not occur: Safety/medical concerns       Blood pressure (!) 151/89, pulse (!) 47, temperature 97.6 F (36.4 C), temperature source Oral, resp. rate 18, height 6\' 3"  (1.905 m), weight 118.1 kg, SpO2 95 %.   Medical Problem List and Plan: 1.  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  Status post thrombectomy.             -patient may shower             -ELOS/Goals: 2-3 weeks- supervision to min A  Con't PT, OT and SLP 2.  Antithrombotics: -DVT/anticoagulation: Lovenox             -antiplatelet therapy: Aspirin 81 mg daily and Plavix 75 mg daily x90 days then aspirin alone beginning 08/08/2021 3. Pain Management: Tylenol as needed 4. Mood: Ativan 0.5 mg every 4 hours as needed anxiety             -antipsychotic agents: N/A 5. Neuropsych: This patient is not capable of making decisions on his own behalf. 6. Skin/Wound Care: Routine skin checks 7. Fluids/Electrolytes/Nutrition: Routine in and outs with follow-up chemistries 8.  Dysphagia.  Carb modified nectar thick liquid.  And advance to a regular consistency 05/13/2021 follow-up speech therapy 9.  Acute respiratory failure with hypoxia and hypercapnia.  Gram-positive cocci pneumonia.  Ceftriaxone 05/10/2021 x 5 to 7 days.  Titrate oxygen as needed.  6/11- lung exam sounds good- no O2 requirements- con't IV ABX 10.  New findings diabetes mellitus.  Hemoglobin A1c 10.1.  Currently on SSI.  Diabetic teaching  6/11- BG's 125-161- with 1 episode of 206-(had just eaten) con't regimen for now 11.  Hypertension.  HCTZ 25 mg daily, clonidine 0.2 mg every 8 hours, Norvasc 10 mg daily, lisinopril 20 mg daily.  Monitor with increased mobility Vitals:   05/15/21 2003 05/16/21 0343  BP: (!) 157/96 (!) 151/89  Pulse: 60 (!) 47  Resp: 18 18  Temp: 98.3 F (36.8 C) 97.6 F  (36.4 C)  SpO2: 97% 95%  Increase clonidine dose to .3mg  TID  Still elevated also with increased BUN and creat will d/c HCTZ, add hydralazine , reduce lisinopril  12.  Hyperlipidemia.  Lipitor 13.  Obesity.  BMI 34.28.  Dietary follow-up 14.  Tobacco abuse.  NicoDerm patch.  Provide counseling 15.  Hypokalemia - will supplement with oral KCL 16.  Pre renal azotemia- likely due to reduced fluid intake plus diuretic, give IV saline bolus x 1 05/18/21 slow rate, reduce HCTZ  dose   Still elevated start IVF at noc   LOS: 4 days A FACE TO FACE EVALUATION WAS PERFORMED  Erick Colace 05/16/2021, 11:03 AM

## 2021-05-16 NOTE — Progress Notes (Signed)
Occupational Therapy Session Note  Patient Details  Name: Peter Lucas MRN: [id number] Date of Birth: [birth year]  Today's Date: 05/16/2021 OT Individual Time: 1300-1410 OT Individual Time Calculation (min): 70 min    Short Term Goals: Week 1:  OT Short Term Goal 1 (Week 1): Pt will complete 3/3 toileting tasks with CGA on toilet with min cuing. OT Short Term Goal 2 (Week 1): Pt will bathe with CGA and min cuing to attend to R side of body. OT Short Term Goal 3 (Week 1): Pt will complete grooming standing sink side with CGA. OT Short Term Goal 4 (Week 1): Pt will complete LB dressing with CGA. OT Short Term Goal 5 (Week 1): Pt will accurately follow 1 step commands for increased safety awareness during ADLs.    Skilled Therapeutic Interventions/Progress Updates:    Pt greeted at time of session supine in bed resting agreeable to OT session, no pain throughout. Wanting to take a shower, supine > sit CGA and stand pivot no AD bed > wheelchair > shower bench with CGA overall. Bed and clothes noted to be soiled with urine, RN aware and relayed to NT for changing. In shower, sit <> stand level to doff clothing, IV covered. UB/LB bathing CGA with very poor safety awareness noted trying to stand and fully turn during shower, Mod/Max cues for sequencing thorughout to attend to next body part/remember what he had washed, and to decrease spillage of soap. However pt still able to perform CGA overall with many cues. Stand pivot shower bench > wheelchair CGA with grab bar, donned shirt and pants Min A cues for hemitechniques verbal and visual but pt unable to follow today, getting RUE stuck in shirt and Max cues to fix. Son entered at this time, reviewed CLOF and goals for Supervision level. Standing 2 trials of BITS with 56% accuracy for simple blue dot game standing for 3 minutes, unable to perform numbers trial #1-10 with Max A to problem solve and guide RUE. Back in room set up with alarm on call bell in  reach for next therapy session, telesitter in room as well. Global aphasia limiting throughout session.   Therapy Documentation Precautions:  Precautions Precautions: Fall, Other (comment) Precaution Comments: globally aphasic, R hemi UE> LE, R inattention Restrictions Weight Bearing Restrictions: No    Therapy/Group: Individual Therapy  Erasmo Score 05/16/2021, 8:16 AM

## 2021-05-16 NOTE — Progress Notes (Signed)
Speech Language Pathology Daily Session Note  Patient Details  Name: Peter Lucas MRN: [id number] Date of Birth: 1966-03-03  Today's Date: 05/16/2021 SLP Individual Time: 8841-6606 SLP Individual Time Calculation (min): 20 min  Short Term Goals: Week 1: SLP Short Term Goal 1 (Week 1): Patient will point to major body parts with maxA cues to localize finger to point and to imitate SLP. SLP Short Term Goal 2 (Week 1): Patient will attend to at least two objects placed in front of him and point to requested object with maxA. SLP Short Term Goal 3 (Week 1): Patient will perform basic level familiar ADL tasks (feeding self, brushing teeth, etc) with maxA for safety. SLP Short Term Goal 4 (Week 1): Patient will utilize verbal and non-verbal means to communicate basic immediate wants/needs with maxA cues. SLP Short Term Goal 5 (Week 1): Patient will imitate to perform basic novel task with maxA cues.  Skilled Therapeutic Interventions:Skilled ST services focused on language skills. Pt missed 10 minutes of ST treatment due to overlap with PT treatment. SLP facilitated following 1 step body commands, pt was unable to imitate pointing/touching body parts on face requiring hand over hand assistance. Pt was better able to follow functional commands when given max gestural cues when MD entered in 3/3 opportunities. Pt was able to identify requested object in a field of 2, with object initially presented on right side, max semantic and written cues, pt demonstrated accuracy in 5 out 10 opportunities. Pt initially favored objects on the left, however as the trials continued selected several items on the right. Pt was left in room with call bell within reach and bed alarm set. SLP recommends to continue skilled services.     Pain Pain Assessment Pain Scale: 0-10 Pain Score: 0-No pain  Therapy/Group: Individual Therapy  Tabari Volkert 05/16/2021, 10:00 AM

## 2021-05-17 LAB — GLUCOSE, CAPILLARY
Glucose-Capillary: 152 mg/dL — ABNORMAL HIGH (ref 70–99)
Glucose-Capillary: 111 mg/dL — ABNORMAL HIGH (ref 70–99)
Glucose-Capillary: 158 mg/dL — ABNORMAL HIGH (ref 70–99)
Glucose-Capillary: 189 mg/dL — ABNORMAL HIGH (ref 70–99)

## 2021-05-17 NOTE — Progress Notes (Signed)
Physical Therapy Session Note  Patient Details  Name: Peter Lucas MRN: [id number] Date of Birth: 08-03-1966  Today's Date: 05/17/2021 PT Individual Time: 0800-0900 and 1400-1445 PT Individual Time Calculation (min): 60 min and 45 min   Short Term Goals: Week 1:  PT Short Term Goal 1 (Week 1): Pt will perform supine <> sit consistently with CGA. PT Short Term Goal 2 (Week 1): Pt will perform safe SPVT transfers with close supervision. PT Short Term Goal 3 (Week 1): Pt will ambulate safely for at least 150 ft using LRAD with good balance.  Skilled Therapeutic Interventions/Progress Updates:  Session 1: Patient supine in bed on entrance to room. RN completing providing pt with morning medications. Patient alert and agreeable to PT session. Patient denied pain during session.  Therapeutic Activity: Bed Mobility: Patient performed supine <> sit with Mod I. No vc required. Pt continues to climb into bed with L knee first in order to return to supine at end of session.  Transfers: Patient performed STS transfers with supervision and SPVT transfers with CGA for balance. Provided vc for maintaining balance and reaching for armrests prior to descent to sit.  Gait Training:  Patient ambulated >400 feet and 100 feet with no AD and CGA with intermittent Min A to maneuver obstacles (including people) on pt's R side x4 d/t R inattention. Pt ambulated to each therapy gym and collected items to carry for use in day room. Provided vc/ tc for directionality and scanning to R.  Neuromuscular Re-ed: NMR facilitated during session with focus on dynamic standing balance, following instructions, and cognition. Pt guided in obstacle course including Airex pad, blue foam wedge, 6" hurdles, and ladder. Pt initially requires Min A to attain balance on Airex and wedge but quickly initiates ankle strategy to maintain balance with no AD. Is also able to complete ladder correctly with difficulty managing R foot step  completely into square.  Continued attempt to complete task of matching color bean bag to colored disc on tray table placed to pt's R side. Pt continues to demonstrate inability to state color of beanbag, inability to match color, inability to follow instruction to place bag instead of tossing to table, and inability to find requested color of bean bag.  NMR performed for improvements in motor control and coordination, balance, sequencing, judgement, and self confidence/ efficacy in performing all aspects of mobility at highest level of independence.   Patient supine in bed at end of session with brakes locked, bed alarm set, and all needs within reach.    Session 2:  Patient supine in bed on entrance to room. Patient alert and agreeable to PT session. Patient denied pain during session.  Therapeutic Activity: Bed Mobility: Patient performed supine <> sit with Mod I. No vc required. Transfers: Patient performed STS  transfers with supervision and SPVT transfers with CGA for balance. Provided vc for looking and reaching for arms of seat prior to descent to sit.  Gait Training:  Patient ambulated >200 feet with CGA. Demonstrated ability to follow verbal directional cues for turn to L but not to R. Provided vc/ tc for scanning to R side in order to maneuver obstacles in pt's R field of view. Provided with vc to look R and allowed to fail with brush to door frame. Requires intermittent Min A to avoid other unsafe obstacles.   Neuromuscular Re-ed: NMR facilitated during session with focus on coordination, motor control, following instructions, and R sided attention. Pt guided in use of  BITS through several different tasks. Pt starts with line and shape tracing tasks using R hand only in standing with objects on R side of screen. 81.81% accurate on lines tracing task and 75% accurate on shapes tracing task. Progressed to sequence task starting with numbers 1 through 15. Required vc/ visual cues ~40% of  task in order to progress to correct number. 45.45% accurate, completed in 27sec with reaction time of 9.78sec. Moved to alphabet sequence which proved most difficult. Completed through 15 letters with pt able to complete first 3 letters with no cues and otherwise requires vc/ visual cues to complete through the rest of the alphabet sequence.46.88% accurate and completed in 51sec with reaction time of 15.38sec. NMR performed for improvements in motor control and coordination, balance, sequencing, judgement, and self confidence/ efficacy in performing all aspects of mobility at highest level of independence.   Patient supine inbedat end of session with brakes locked, bed alarm set, and all needs within reach.   Therapy Documentation Precautions:  Precautions Precautions: Fall, Other (comment) Precaution Comments: globally aphasic, R hemi UE> LE, R inattention Restrictions Weight Bearing Restrictions: No  Therapy/Group: Individual Therapy  Loel Dubonnet PT, DPT 05/17/2021, 7:57 AM

## 2021-05-17 NOTE — Progress Notes (Signed)
Speech Language Pathology Daily Session Note  Patient Details  Name: Peter Lucas MRN: [id number] Date of Birth: January 07, 1966  Today's Date: 05/17/2021 SLP Individual Time: 1105-1130 SLP Individual Time Calculation (min): 25 min  Short Term Goals: Week 1: SLP Short Term Goal 1 (Week 1): Patient will point to major body parts with maxA cues to localize finger to point and to imitate SLP. SLP Short Term Goal 2 (Week 1): Patient will attend to at least two objects placed in front of him and point to requested object with maxA. SLP Short Term Goal 3 (Week 1): Patient will perform basic level familiar ADL tasks (feeding self, brushing teeth, etc) with maxA for safety. SLP Short Term Goal 4 (Week 1): Patient will utilize verbal and non-verbal means to communicate basic immediate wants/needs with maxA cues. SLP Short Term Goal 5 (Week 1): Patient will imitate to perform basic novel task with maxA cues.  Skilled Therapeutic Interventions:Skilled ST services focused on language skills. SLP facilitated following commands, identifying common objects in a field of 2, pt demonstrated accuracy in 1 out 5 opportunities with semantic,and demonstration cues, but increased to 3 out 5 accuracy on second trial when given written name cues. Pt was able to read initial phoneme of 2 out 5 words. Pt produces appropriate greetings, but unable to engage in structured automatic language task, counting. Pt was left in room with call bell within reach and bed alarm set. SLP recommends to continue skilled services.     Pain Pain Assessment Pain Scale: 0-10 Pain Score: 0-No pain  Therapy/Group: Individual Therapy  Haden Cavenaugh  [facility] 05/17/2021, 11:35 AM

## 2021-05-17 NOTE — Progress Notes (Signed)
PROGRESS NOTE   Subjective/Complaints:  Daughter at bedside this am , she fels he has been conversing with her  more than last week   ROS- limited by aphasia- receptive aphasia  Objective:   No results found. No results for input(s): WBC, HGB, HCT, PLT in the last 72 hours.  Recent Labs    05/16/21 0552  NA 135  K 3.5  CL 102  CO2 22  GLUCOSE 155*  BUN 41*  CREATININE 1.48*  CALCIUM 9.2     Intake/Output Summary (Last 24 hours) at 05/17/2021 0901 Last data filed at 05/17/2021 0752 Gross per 24 hour  Intake 600 ml  Output --  Net 600 ml         Physical Exam: Vital Signs Blood pressure 121/74, pulse (!) 52, temperature 98.4 F (36.9 C), resp. rate 18, height 6\' 3"  (1.905 m), weight 118.1 kg, SpO2 95 %.  General: No acute distress Mood and affect are appropriate Heart: Regular rate and rhythm no rubs murmurs or extra sounds Lungs: Clear to auscultation, breathing unlabored, no rales or wheezes Abdomen: Positive bowel sounds, soft nontender to palpation, nondistended Extremities: No clubbing, cyanosis, or edema Skin: No evidence of breakdown, no evidence of rash   Extremities: No clubbing, cyanosis, or edema Skin: No evidence of breakdown, no evidence of rash Neurologic: Cranial nerves II through XII intact, motor strength is 5/5 in left 4/5 RIght deltoid, bicep, tricep, grip, hip flexor, knee extensors, ankle dorsiflexor and plantar flexor Cannot assess orientation due to laphsia Has difficulty following 1-2 step commands unless visual cues given  Sensory exam ncannot assess due to aphasia Cerebellar exam difficulty following multistep command  Musculoskeletal: Full range of motion in all 4 extremities. No joint swelling   Assessment/Plan: 1. Functional deficits which require 3+ hours per day of interdisciplinary therapy in a comprehensive inpatient rehab setting. Physiatrist is providing close team  supervision and 24 hour management of active medical problems listed below. Physiatrist and rehab team continue to assess barriers to discharge/monitor patient progress toward functional and medical goals  Care Tool:  Bathing    Body parts bathed by patient: Right arm, Chest, Abdomen, Front perineal area, Buttocks, Right upper leg, Face, Left lower leg, Right lower leg, Left upper leg, Left arm   Body parts bathed by helper: Left arm     Bathing assist Assist Level: Contact Guard/Touching assist     Upper Body Dressing/Undressing Upper body dressing   What is the patient wearing?: Pull over shirt    Upper body assist Assist Level: Minimal Assistance - Patient > 75%    Lower Body Dressing/Undressing Lower body dressing      What is the patient wearing?: Pants, Underwear/pull up     Lower body assist Assist for lower body dressing: Minimal Assistance - Patient > 75%     Toileting Toileting    Toileting assist Assist for toileting: Contact Guard/Touching assist     Transfers Chair/bed transfer  Transfers assist     Chair/bed transfer assist level: Contact Guard/Touching assist     Locomotion Ambulation   Ambulation assist      Assist level: Contact Guard/Touching assist Assistive device: Walker-rolling Max  distance: >300 ft   Walk 10 feet activity   Assist     Assist level: Contact Guard/Touching assist Assistive device: No Device   Walk 50 feet activity   Assist    Assist level: Contact Guard/Touching assist Assistive device: No Device    Walk 150 feet activity   Assist    Assist level: Contact Guard/Touching assist Assistive device: No Device    Walk 10 feet on uneven surface  activity   Assist Walk 10 feet on uneven surfaces activity did not occur: Safety/medical concerns         Wheelchair     Assist Will patient use wheelchair at discharge?: No Type of Wheelchair: Manual Wheelchair activity did not occur:  Safety/medical concerns         Wheelchair 50 feet with 2 turns activity    Assist    Wheelchair 50 feet with 2 turns activity did not occur: Safety/medical concerns       Wheelchair 150 feet activity     Assist  Wheelchair 150 feet activity did not occur: Safety/medical concerns       Blood pressure 121/74, pulse (!) 52, temperature 98.4 F (36.9 C), resp. rate 18, height 6\' 3"  (1.905 m), weight 118.1 kg, SpO2 95 %.   Medical Problem List and Plan: 1.  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  Status post thrombectomy.             -patient may shower             -ELOS/Goals: 2-3 weeks- supervision to min A- team conf in am   Con't PT, OT and SLP 2.  Antithrombotics: -DVT/anticoagulation: Lovenox             -antiplatelet therapy: Aspirin 81 mg daily and Plavix 75 mg daily x90 days then aspirin alone beginning 08/08/2021 3. Pain Management: Tylenol as needed 4. Mood: Ativan 0.5 mg every 4 hours as needed anxiety             -antipsychotic agents: N/A 5. Neuropsych: This patient is not capable of making decisions on his own behalf. 6. Skin/Wound Care: Routine skin checks 7. Fluids/Electrolytes/Nutrition: Routine in and outs with follow-up chemistries 8.  Dysphagia.  Carb modified nectar thick liquid.  And advance to a regular consistency 05/13/2021 follow-up speech therapy 9.  Acute respiratory failure with hypoxia and hypercapnia.  Gram-positive cocci pneumonia.  Ceftriaxone 05/10/2021 x 5 to 7 days.  Titrate oxygen as needed.  6/11- lung exam sounds good- no O2 requirements- con't IV ABX 10.  New findings diabetes mellitus.  Hemoglobin A1c 10.1.  Currently on SSI.  Diabetic teaching  6/11- BG's 125-161- with 1 episode of 206-(had just eaten) con't regimen for now 11.  Hypertension.  HCTZ 25 mg daily, clonidine 0.2 mg every 8 hours, Norvasc 10 mg daily, lisinopril 20 mg  daily.  Monitor with increased mobility Vitals:   05/17/21 0559 05/17/21 0802  BP: (!) 152/91 121/74  Pulse: 99 (!) 52  Resp: 18   Temp: 98.4 F (36.9 C)   SpO2: 95%   Increase clonidine dose to .3mg  TID  Still elevated also with increased BUN and creat will d/c HCTZ, add hydralazine , reduce lisinopril  Improved after meds this am , cont to monitor with med changes  12.  Hyperlipidemia.  Lipitor 13.  Obesity.  BMI 34.28.  Dietary follow-up 14.  Tobacco abuse.  NicoDerm patch.  Provide counseling 15.  Hypokalemia - will supplement with oral KCL 16.  Pre renal azotemia- likely due to reduced fluid intake plus diuretic, give IV saline bolus x 1 slow rate, Now off HCTZ, also reduced ACE I dose   Still elevated start IVF at noc   LOS: 5 days A FACE TO FACE EVALUATION WAS PERFORMED  Erick Colace 05/17/2021, 9:01 AM

## 2021-05-17 NOTE — Progress Notes (Signed)
Occupational Therapy Session Note  Patient Details  Name: Peter Lucas MRN: [id number] Date of Birth: 1966/05/03  Today's Date: 05/17/2021 OT Individual Time: 1002-1100 OT Individual Time Calculation (min): 58 min    Short Term Goals: Week 1:  OT Short Term Goal 1 (Week 1): Pt will complete 3/3 toileting tasks with CGA on toilet with min cuing. OT Short Term Goal 2 (Week 1): Pt will bathe with CGA and min cuing to attend to R side of body. OT Short Term Goal 3 (Week 1): Pt will complete grooming standing sink side with CGA. OT Short Term Goal 4 (Week 1): Pt will complete LB dressing with CGA. OT Short Term Goal 5 (Week 1): Pt will accurately follow 1 step commands for increased safety awareness during ADLs.  Skilled Therapeutic Interventions/Progress Updates:    Pt received in bed with daughter at beside and consented to OT tx. Pt got up from bed and sat in w/c with CGA. Wheeled down to gym for time mgmt, pt instructed to standing task with visual scanning to increase attention to R side. Pt given card game to match while standing, pt able to match cards and requiring mod visual and verbal cuing to locate cards on R side of the board. Cards placed on R side of pt to encourage R side attention. Yellow pegs taped to R side of board to provide visual scanning anchor for pt to refer back to, but had poor carryover. After standing activity, pt instructed in peg board task to increase visual scanning, problem solving, and executive functioning. Pt instructed to use R hand during all tasks to increase coordination and attention. Pt able to follow commands to take out certain color pegs from pegboard, place certain color pegs back into pegboard with bucket of all pegs on R side of pt for increased R sided attention. After tx, pt helped back to bed (gets in knees first-dtr reports he does that at home), and left with all needs met, bed alarm on, dtr present and telesitter. Pt demo'd overall great  improvement with command following this session.  Therapy Documentation Precautions:  Precautions Precautions: Fall, Other (comment) Precaution Comments: globally aphasic, R hemi UE> LE, R inattention Restrictions Weight Bearing Restrictions: No General:   Vital Signs: Therapy Vitals Temp: 98.4 F (36.9 C) Pulse Rate: 99 Resp: 18 BP: (!) 152/91 Patient Position (if appropriate): Lying Oxygen Therapy SpO2: 95 % O2 Device: Room Air Pain: none      Therapy/Group: Individual Therapy  Gilad Dugger 05/17/2021, 7:55 AM

## 2021-05-18 LAB — BASIC METABOLIC PANEL
Anion gap: 8 (ref 5–15)
BUN: 46 mg/dL — ABNORMAL HIGH (ref 6–20)
CO2: 23 mmol/L (ref 22–32)
Calcium: 8.8 mg/dL — ABNORMAL LOW (ref 8.9–10.3)
Chloride: 100 mmol/L (ref 98–111)
Creatinine, Ser: 1.63 mg/dL — ABNORMAL HIGH (ref 0.61–1.24)
GFR, Estimated: 49 mL/min — ABNORMAL LOW (ref >60)
Glucose, Bld: 115 mg/dL — ABNORMAL HIGH (ref 70–99)
Potassium: 4 mmol/L (ref 3.5–5.1)
Sodium: 131 mmol/L — ABNORMAL LOW (ref 135–145)

## 2021-05-18 LAB — GLUCOSE, CAPILLARY
Glucose-Capillary: 144 mg/dL — ABNORMAL HIGH (ref 70–99)
Glucose-Capillary: 124 mg/dL — ABNORMAL HIGH (ref 70–99)
Glucose-Capillary: 174 mg/dL — ABNORMAL HIGH (ref 70–99)
Glucose-Capillary: 338 mg/dL — ABNORMAL HIGH (ref 70–99)

## 2021-05-18 NOTE — Progress Notes (Signed)
Speech Language Pathology Daily Session Note  Patient Details  Name: NASSER KU MRN: [id number] Date of Birth: 14-Jan-1966  Today's Date: 05/18/2021 SLP Individual Time: 1300-1345 SLP Individual Time Calculation (min): 45 min  Short Term Goals: Week 1: SLP Short Term Goal 1 (Week 1): Patient will point to major body parts with maxA cues to localize finger to point and to imitate SLP. SLP Short Term Goal 2 (Week 1): Patient will attend to at least two objects placed in front of him and point to requested object with maxA. SLP Short Term Goal 3 (Week 1): Patient will perform basic level familiar ADL tasks (feeding self, brushing teeth, etc) with maxA for safety. SLP Short Term Goal 4 (Week 1): Patient will utilize verbal and non-verbal means to communicate basic immediate wants/needs with maxA cues. SLP Short Term Goal 5 (Week 1): Patient will imitate to perform basic novel task with maxA cues.  Skilled Therapeutic Interventions: Skilled treatment session focused on communication goals. Upon arrival, patient was clearly frustrated about a particular event or specific staff members and was attempting to communicate his frustrations. SLP attempted Max A multimodal cues for clarity including drawing with minimal awareness of errors or communication breakdown.  Patient's overall message was ~25% intelligible with islands of intelligible speech noted. SLP provided auditory feedback by playing an audio recording of himself with minimal success. Patient's overall frustration led to emotional lability with SLP providing support. Patient left sitting EOB with alarm on and all needs within reach. Continue with current plan of care.      Pain Pain Assessment Pain Score: 0-No pain  Therapy/Group: Individual Therapy  Donette Mainwaring 05/18/2021, 3:02 PM

## 2021-05-18 NOTE — Progress Notes (Addendum)
PROGRESS NOTE   Subjective/Complaints:  PT needing to use gestural cues   ROS- limited by aphasia- receptive aphasia  Objective:   No results found. No results for input(s): WBC, HGB, HCT, PLT in the last 72 hours.  Recent Labs    05/16/21 0552  NA 135  K 3.5  CL 102  CO2 22  GLUCOSE 155*  BUN 41*  CREATININE 1.48*  CALCIUM 9.2     Intake/Output Summary (Last 24 hours) at 05/18/2021 0903 Last data filed at 05/17/2021 2200 Gross per 24 hour  Intake 420 ml  Output --  Net 420 ml         Physical Exam: Vital Signs Blood pressure 123/69, pulse (!) 43, temperature 98.2 F (36.8 C), resp. rate 18, height 6' 3" (1.905 m), weight 118.1 kg, SpO2 99 %.  General: No acute distress Mood and affect are appropriate Heart: Regular rate and rhythm no rubs murmurs or extra sounds Lungs: Clear to auscultation, breathing unlabored, no rales or wheezes Abdomen: Positive bowel sounds, soft nontender to palpation, nondistended  Extremities: No clubbing, cyanosis, or edema Skin: No evidence of breakdown, no evidence of rash Neurologic: Cranial nerves II through XII intact, motor strength is 5/5 in left 4/5 RIght deltoid, bicep, tricep, grip, hip flexor, knee extensors, ankle dorsiflexor and plantar flexor Cannot assess orientation due to laphsia Has difficulty following 1-2 step commands unless visual cues given  Sensory exam ncannot assess due to aphasia Cerebellar exam difficulty following multistep command  Musculoskeletal: Full range of motion in all 4 extremities. No joint swelling   Assessment/Plan: 1. Functional deficits which require 3+ hours per day of interdisciplinary therapy in a comprehensive inpatient rehab setting. Physiatrist is providing close team supervision and 24 hour management of active medical problems listed below. Physiatrist and rehab team continue to assess barriers to discharge/monitor patient  progress toward functional and medical goals  Care Tool:  Bathing    Body parts bathed by patient: Right arm, Chest, Abdomen, Front perineal area, Buttocks, Right upper leg, Face, Left lower leg, Right lower leg, Left upper leg, Left arm   Body parts bathed by helper: Left arm     Bathing assist Assist Level: Contact Guard/Touching assist     Upper Body Dressing/Undressing Upper body dressing   What is the patient wearing?: Pull over shirt    Upper body assist Assist Level: Minimal Assistance - Patient > 75%    Lower Body Dressing/Undressing Lower body dressing      What is the patient wearing?: Pants, Underwear/pull up     Lower body assist Assist for lower body dressing: Minimal Assistance - Patient > 75%     Toileting Toileting    Toileting assist Assist for toileting: Contact Guard/Touching assist     Transfers Chair/bed transfer  Transfers assist     Chair/bed transfer assist level: Contact Guard/Touching assist     Locomotion Ambulation   Ambulation assist      Assist level: Contact Guard/Touching assist Assistive device: Walker-rolling Max distance: >300 ft   Walk 10 feet activity   Assist     Assist level: Contact Guard/Touching assist Assistive device: No Device   Walk 50  feet activity   Assist    Assist level: Contact Guard/Touching assist Assistive device: No Device    Walk 150 feet activity   Assist    Assist level: Contact Guard/Touching assist Assistive device: No Device    Walk 10 feet on uneven surface  activity   Assist Walk 10 feet on uneven surfaces activity did not occur: Safety/medical concerns         Wheelchair     Assist Will patient use wheelchair at discharge?: No Type of Wheelchair: Manual Wheelchair activity did not occur: Safety/medical concerns         Wheelchair 50 feet with 2 turns activity    Assist    Wheelchair 50 feet with 2 turns activity did not occur: Safety/medical  concerns       Wheelchair 150 feet activity     Assist  Wheelchair 150 feet activity did not occur: Safety/medical concerns       Blood pressure 123/69, pulse (!) 43, temperature 98.2 F (36.8 C), resp. rate 18, height 6' 3" (1.905 m), weight 118.1 kg, SpO2 99 %.   Medical Problem List and Plan: 1.  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  Status post thrombectomy.             -patient may shower             -ELOS/Goals: 2-3 weeks- supervision to min A- Team conference today please see physician documentation under team conference tab, met with team  to discuss problems,progress, and goals. Formulized individual treatment plan based on medical history, underlying problem and comorbidities.   Con't PT, OT and SLP 2.  Antithrombotics: -DVT/anticoagulation: Lovenox             -antiplatelet therapy: Aspirin 81 mg daily and Plavix 75 mg daily x90 days then aspirin alone beginning 08/08/2021 3. Pain Management: Tylenol as needed 4. Mood: Ativan 0.5 mg every 4 hours as needed anxiety             -antipsychotic agents: N/A 5. Neuropsych: This patient is not capable of making decisions on his own behalf. 6. Skin/Wound Care: Routine skin checks 7. Fluids/Electrolytes/Nutrition: Routine in and outs with follow-up chemistries 8.  Dysphagia.  Carb modified nectar thick liquid.  And advance to a regular consistency 05/13/2021 follow-up speech therapy 9.  Acute respiratory failure with hypoxia and hypercapnia.  Gram-positive cocci pneumonia.  Ceftriaxone 05/10/2021 x 5 to 7 days.  Resolved 11.  Hypertension.  HCTZ 25 mg daily, clonidine 0.2 mg every 8 hours, Norvasc 10 mg daily, lisinopril 20 mg daily.  Monitor with increased mobility Vitals:   05/18/21 0039 05/18/21 0502  BP: 123/77 123/69  Pulse: (!) 50 (!) 43  Resp: 16 18  Temp: 98 F (36.7 C) 98.2 F (36.8 C)  SpO2:  96% 99%  Increase clonidine dose to .48m TID, brady, will check EKG, no symptoms of dizziness  Still elevated also with increased BUN and creat will d/c HCTZ, add hydralazine , d/c lisinopril  Improved after meds this am , cont to monitor with med changes  12.  Hyperlipidemia.  Lipitor 13.  Obesity.  BMI 34.28.  Dietary follow-up 14.  Tobacco abuse.  NicoDerm patch.  Provide counseling 15.  Hypokalemia - will supplement with oral KCL, off ACE , HCTZ so will d/c and recheck BMET 16.  Pre renal azotemia- likely due to reduced  fluid intake plus diuretic, give IV saline bolus x 1 555m slow rate, Now off HCTZ, also d/ced ACE   Still elevated start IVF at noc   LOS: 6 days A FACE TO FGreentopE Sirius Woodford 05/18/2021, 9:03 AM

## 2021-05-18 NOTE — Progress Notes (Signed)
Physical Therapy Session Note  Patient Details  Name: Peter Lucas MRN: [id number] Date of Birth: 02-Feb-1966  Today's Date: 05/18/2021 PT Individual Time: 9767-3419 and 1446-1530 PT Individual Time Calculation (min): 58 min and 44 min   Short Term Goals: Week 1:  PT Short Term Goal 1 (Week 1): Pt will perform supine <> sit consistently with CGA. PT Short Term Goal 2 (Week 1): Pt will perform safe SPVT transfers with close supervision. PT Short Term Goal 3 (Week 1): Pt will ambulate safely for at least 150 ft using LRAD with good balance.  Skilled Therapeutic Interventions/Progress Updates:  Session 1: Patient supine in bed on entrance to room. Patient alert and agreeable to PT session. Patient denied pain during session.  Therapeutic Activity: Bed Mobility: Patient performed supine <> sit with Mod I. Transfers: Prior to leaving room, pt requests to toilet, pt initially stands at toilet to urinate requiring CGA for balance and missing toilet bowl to R side. Pt cued and self corrected. Shorts seen to be dirty with fecal matter and urged pt to change underwear and shorts on light protest from pt stating that he "likes those". Min A to don underwear and shorts over shoes. Patient performed STS and SPVT transfers with supervision/ CGA. Provided verbal cues for reaching back for chair prior to descent to sit.   Gait Training:  Patient ambulated 100' x1/ >200' x1 feet with no AD and CGA. Demonstrated R field of view impairment requiring vc/ tc to avoid running R side of body into doorway and other obstacles. Provided multimodal cues for scanning to R side.  Neuromuscular Re-ed: NMR facilitated during session with focus on dynamic standing balance, R side attention and RUE motor control. Pt guided in turning and using R hand to grab pinch pins and place to bars close to direct lateral R side. Noted to turn head and eye movement with full ROM to R bilaterally.  Pt also participated in beanbag toss  to target placed 12 feet away requiring increased arm swing and weight shift. Pt retrieves beanbags from board with supervision and is able to count sequentially correctly with minimal cueing after first bout. In retrieving bags after second bout, pt starts to count sequentially but then begins to add and subtract as if he is attempt to score an actual game of cornhole although there are too many bags and math is incorrect.  NMR performed for improvements in motor control and coordination, balance, sequencing, judgement, and self confidence/ efficacy in performing all aspects of mobility at highest level of independence.   Patient supine in bed at end of session with brakes locked, bed alarm set, and all needs within reach.  Session 2: Patient supine in bed on entrance to room just completing OT session. Patient alert and agreeable to PT session. Patient denied pain during session.  Therapeutic Activity: Transfers: Patient performed STS transfers with supervision and SPVT transfers with CGA for balance. Provided verbal cues for positioning to chair prior to sit.  Gait Training:  Patient ambulated 52' x1/ 150' x2 with no AD and CGA. Demonstrated continued inattention to R field of vision requiring multimodal cues to maneuver around obstacles.  Neuromuscular Re-ed: NMR facilitated during session with focus on attention to R field of vision, and dynamic gait and balance. Pt guided in completion of pin push task he started with OT. Completed in standing and required ~60% verbal and visual cueing to complete final 8 targets.   Pt also guided though ladder drill  stepping forward with one foot in each square. Requires visual demonstration with verbal instruction and several attempts prior to correct performance. Difficulty with R foot placement inside square. Pt guided in lateral stepping with both feet into square. When stepping to R, L foot steps onto R foot ~50% of time. Good coordinated movement noted to  L side.NMR performed for improvements in motor control and coordination, balance, sequencing, judgement, and self confidence/ efficacy in performing all aspects of mobility at highest level of independence.   Patient semireclined in bed at end of session with brakes locked, bed alarm set, and all needs within reach.     Therapy Documentation Precautions:  Precautions Precautions: Fall, Other (comment) Precaution Comments: globally aphasic, R hemi UE> LE, R inattention Restrictions Weight Bearing Restrictions: No  Therapy/Group: Individual Therapy  Loel Dubonnet PT, DPT 05/18/2021, 6:47 PM

## 2021-05-18 NOTE — Patient Care Conference (Signed)
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 Claudette Laws Care Coodinator Present: Chana Bode, RN, BSN, CRRN;Christina [place], BSW Nurse Present: Chana Bode, RN PT Present: Other (comment) Sharol Harness, PT) OT Present: Perrin Maltese, OT SLP Present: Feliberto Gottron, SLP PPS Coordinator present : Edson Snowball, PT     Current Status/Progress Goal Weekly Team Focus  Bowel/Bladder   Patient is incontinent of B&B  To regain B&B continence  Regular toileting   Swallow/Nutrition/ Hydration             ADL's   Min A for dressing, min A for bathing with lots of cuing for initiation. CGA for toileting and transfers. Improved command following, pt responds better when instruction is demonstrated. continues to have safety awareness deficits  SUP  overall cognition during functional tasks including: executive functioning, command following, safety awareness, attention to R side. ADL retraining, pt/family education, balance   Mobility   Bed mobility = Mod I; Transfers = CGA; Gait = >300 ft with no AD and CGA/ Min A for R inattention; significantly reduced safety awareness  Bed mobility at Mod I; Transfers at SUP; Gait at [facility] for >200 feet with SUP; Steps with SUP and RHR  improving R attention, following instructions, transfers, quality of gait,  safety awareness   Communication   Max A  modA basic comprehension and expression (multimodal)  following commands/identifying objects in field of 2, automatic language and naming   Safety/Cognition/ Behavioral Observations  Max A  modA basic problem solving and safety  basic problem solving, attention and awareness   Pain   Patient does not complain of any pain  Keep patient pain free  Assess patient of pain q shift and prn   Skin   Patient skin is intact  maintain patient's skin integrity  Keep patient clean and dry, scheduled toileting     Discharge Planning:  discharge back home with children and siblings to assist with care   Team Discussion: Expressive aphasia with some spontaneous verbalizations (fluent aphasia) and some apraxia noted but good with visual cues. Right visual deficit and poor safety awareness noted. BP controlled. MD working on electrolyte imbalance and hypokalemia, fluid balance.    Patient on target to meet rehab goals: yes, currently requires min assist for bathing and dressing with demonstrational cues. CGA for toileting,CGA for transfers, and gait. Discharge goals set for supervision level.  *See Care Plan and progress notes for long and short-term goals.   Revisions to Treatment Plan:  Working on comprehension and aphasia   Teaching Needs: Safety, demo cues, medication management, transfers, toileting, etc.  Current Barriers to Discharge: Decreased caregiver support and Home enviroment access/layout  Possible Resolutions to Barriers: Family education     Medical Summary Current Status: Remains with severe Warnicke's aphasia.,  Blood pressure control much better  Barriers  to Discharge: Medical stability   Possible Resolutions to Barriers/Weekly Focus: Medication changes, monitor electrolytes off HCTZ and KCl   Continued Need for Acute Rehabilitation Level of Care: The patient requires daily medical management by a physician with specialized  training in physical medicine and rehabilitation for the following reasons: Direction of a multidisciplinary physical rehabilitation program to maximize functional independence : Yes Medical management of patient stability for increased activity during participation in an intensive rehabilitation regime.: Yes Analysis of laboratory values and/or radiology reports with any subsequent need for medication adjustment and/or medical intervention. : Yes   I attest that I was present, lead the team conference, and concur with the assessment and plan of the team.   Chana Bode B 05/18/2021, 12:03 PM

## 2021-05-18 NOTE — Progress Notes (Signed)
Occupational Therapy Session Note  Patient Details  Name: Peter Lucas MRN: [id number] Date of Birth: 1966-06-30  Today's Date: 05/18/2021 OT Individual Time: 1400-1450 OT Individual Time Calculation (min): 50 min    Short Term Goals: Week 1:  OT Short Term Goal 1 (Week 1): Pt will complete 3/3 toileting tasks with CGA on toilet with min cuing. OT Short Term Goal 2 (Week 1): Pt will bathe with CGA and min cuing to attend to R side of body. OT Short Term Goal 3 (Week 1): Pt will complete grooming standing sink side with CGA. OT Short Term Goal 4 (Week 1): Pt will complete LB dressing with CGA. OT Short Term Goal 5 (Week 1): Pt will accurately follow 1 step commands for increased safety awareness during ADLs.  Skilled Therapeutic Interventions/Progress Updates:    Pt received in bed with family present and consented to OT tx. Pt walked down to day room with CGA and no device, instructed in jumbo puzzle while standing at elevated table top. Activity focused on spatial awareness, R side attention, and executive functioning. While working on puzzle, pt was totally dependent to put together R side, even with max cuing. Once pt began to work on L side, he required less cuing and mod A to put the correct puzzle pieces together. The task took an extensive amount of time, and it was clear pt was becoming frustrated, but pt was able to communicate that he wanted to finish the puzzle. Once the puzzle was finished, pt instructed to take pieces apart and place in the box all the way to the R side of pt to increase attention to R side. Pt then instructed in [facility] activity while seated at the table, instructed to place tacks in cork board with matching spots in a circular pattern. Pt req min cuing for proper completion and to properly match colors, but then got the hang of it. Pt filled in tacks only on L side of pattern initially, then with cuing was able to very slowly make his way to the right side. After tx, pt  walked back to room and handed off to PT.   Therapy Documentation Precautions:  Precautions Precautions: Fall, Other (comment) Precaution Comments: globally aphasic, R hemi UE> LE, R inattention Restrictions Weight Bearing Restrictions: No General:   Vital Signs: Therapy Vitals Temp: (!) 97.1 F (36.2 C) Pulse Rate: (!) 56 BP: 115/64 Patient Position (if appropriate): Sitting Oxygen Therapy SpO2: 100 % O2 Device: Room Air Pain: Pain Assessment Pain Score: 0-No pain    Therapy/Group: Individual Therapy  Anden Bartolo 05/18/2021, 4:00 PM

## 2021-05-18 NOTE — Progress Notes (Signed)
PROGRESS NOTE   Subjective/Complaints:  Working with physical therapy, requiring gestural cues, has difficulty with even simple commands verbally.  ROS- limited by aphasia- receptive aphasia  Objective:   No results found. No results for input(s): WBC, HGB, HCT, PLT in the last 72 hours.  Recent Labs    05/16/21 0552  NA 135  K 3.5  CL 102  CO2 22  GLUCOSE 155*  BUN 41*  CREATININE 1.48*  CALCIUM 9.2     Intake/Output Summary (Last 24 hours) at 05/18/2021 0954 Last data filed at 05/17/2021 2200 Gross per 24 hour  Intake 420 ml  Output --  Net 420 ml         Physical Exam: Vital Signs Blood pressure 123/69, pulse (!) 43, temperature 98.2 F (36.8 C), resp. rate 18, height '6\' 3"'  (1.905 m), weight 118.1 kg, SpO2 99 %.  General: No acute distress Mood and affect are appropriate Heart: Regular rate and rhythm no rubs murmurs or extra sounds Lungs: Clear to auscultation, breathing unlabored, no rales or wheezes Abdomen: Positive bowel sounds, soft nontender to palpation, nondistended  Extremities: No clubbing, cyanosis, or edema Skin: No evidence of breakdown, no evidence of rash Neurologic: Cranial nerves II through XII intact, motor strength is 5/5 in left 4/5 RIght deltoid, bicep, tricep, grip, hip flexor, knee extensors, ankle dorsiflexor and plantar flexor Cannot assess orientation due to laphsia Has difficulty following 1-2 step commands unless visual cues given  Sensory exam ncannot assess due to aphasia Cerebellar exam difficulty following multistep command  Musculoskeletal: Full range of motion in all 4 extremities. No joint swelling   Assessment/Plan: 1. Functional deficits which require 3+ hours per day of interdisciplinary therapy in a comprehensive inpatient rehab setting. Physiatrist is providing close team supervision and 24 hour management of active medical problems listed  below. Physiatrist and rehab team continue to assess barriers to discharge/monitor patient progress toward functional and medical goals  Care Tool:  Bathing    Body parts bathed by patient: Right arm, Chest, Abdomen, Front perineal area, Buttocks, Right upper leg, Face, Left lower leg, Right lower leg, Left upper leg, Left arm   Body parts bathed by helper: Left arm     Bathing assist Assist Level: Contact Guard/Touching assist     Upper Body Dressing/Undressing Upper body dressing   What is the patient wearing?: Pull over shirt    Upper body assist Assist Level: Minimal Assistance - Patient > 75%    Lower Body Dressing/Undressing Lower body dressing      What is the patient wearing?: Pants, Underwear/pull up     Lower body assist Assist for lower body dressing: Minimal Assistance - Patient > 75%     Toileting Toileting    Toileting assist Assist for toileting: Contact Guard/Touching assist     Transfers Chair/bed transfer  Transfers assist     Chair/bed transfer assist level: Contact Guard/Touching assist     Locomotion Ambulation   Ambulation assist      Assist level: Contact Guard/Touching assist Assistive device: Walker-rolling Max distance: >300 ft   Walk 10 feet activity   Assist     Assist level: Contact Guard/Touching assist Assistive  device: No Device   Walk 50 feet activity   Assist    Assist level: Contact Guard/Touching assist Assistive device: No Device    Walk 150 feet activity   Assist    Assist level: Contact Guard/Touching assist Assistive device: No Device    Walk 10 feet on uneven surface  activity   Assist Walk 10 feet on uneven surfaces activity did not occur: Safety/medical concerns         Wheelchair     Assist Will patient use wheelchair at discharge?: No Type of Wheelchair: Manual Wheelchair activity did not occur: Safety/medical concerns         Wheelchair 50 feet with 2 turns  activity    Assist    Wheelchair 50 feet with 2 turns activity did not occur: Safety/medical concerns       Wheelchair 150 feet activity     Assist  Wheelchair 150 feet activity did not occur: Safety/medical concerns       Blood pressure 123/69, pulse (!) 43, temperature 98.2 F (36.8 C), resp. rate 18, height '6\' 3"'  (1.905 m), weight 118.1 kg, SpO2 99 %.   Medical Problem List and Plan: 1.  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  Status post thrombectomy.             -patient may shower             -ELOS/Goals: 2-3 weeks- supervision to min A- Team conference today please see physician documentation under team conference tab, met with team  to discuss problems,progress, and goals. Formulized individual treatment plan based on medical history, underlying problem and comorbidities.   Con't PT, OT and SLP 2.  Antithrombotics: -DVT/anticoagulation: Lovenox             -antiplatelet therapy: Aspirin 81 mg daily and Plavix 75 mg daily x90 days then aspirin alone beginning 08/08/2021 3. Pain Management: Tylenol as needed 4. Mood: Ativan 0.5 mg every 4 hours as needed anxiety             -antipsychotic agents: N/A 5. Neuropsych: This patient is not capable of making decisions on his own behalf. 6. Skin/Wound Care: Routine skin checks 7. Fluids/Electrolytes/Nutrition: Routine in and outs with follow-up chemistries 8.  Dysphagia.  Carb modified nectar thick liquid.  And advance to a regular consistency 05/13/2021 follow-up speech therapy 9.  Acute respiratory failure with hypoxia and hypercapnia.  Gram-positive cocci pneumonia.  Ceftriaxone 05/10/2021 x 5 to 7 days.  Resolved 11.  Hypertension.  HCTZ 25 mg daily, clonidine 0.2 mg every 8 hours, Norvasc 10 mg daily, lisinopril 20 mg daily.  Monitor with increased mobility Vitals:   05/18/21 0039 05/18/21 0502  BP:  123/77 123/69  Pulse: (!) 50 (!) 43  Resp: 16 18  Temp: 98 F (36.7 C) 98.2 F (36.8 C)  SpO2: 96% 99%  Increase clonidine dose to .31m TID, brady, will check EKG, no symptoms of dizziness  Still elevated also with increased BUN and creat will d/c HCTZ, add hydralazine , d/c lisinopril  Improved after meds this am , cont to monitor with med changes  12.  Hyperlipidemia.  Lipitor 13.  Obesity.  BMI 34.28.  Dietary follow-up 14.  Tobacco abuse.  NicoDerm patch.  Provide counseling 15.  Hypokalemia - will supplement with oral KCL, off ACE , HCTZ so will d/c and recheck BMET 16.  Pre renal azotemia- likely due to reduced fluid intake plus diuretic, give IV saline bolus x 1 556m slow rate, Now off HCTZ, also d/ced ACE   Still elevated start IVF at noc   LOS: 6 days A FACE TO FGaryE Macy Polio 05/18/2021, 9:54 AM

## 2021-05-18 NOTE — Progress Notes (Signed)
Occupational Therapy Session Note  Patient Details  Name: Peter Lucas MRN: [id number] Date of Birth: 10-31-1966  Today's Date: 05/18/2021 OT Individual Time: 1035-1105 OT Individual Time Calculation (min): 30 min    Short Term Goals: Week 1:  OT Short Term Goal 1 (Week 1): Pt will complete 3/3 toileting tasks with CGA on toilet with min cuing. OT Short Term Goal 2 (Week 1): Pt will bathe with CGA and min cuing to attend to R side of body. OT Short Term Goal 3 (Week 1): Pt will complete grooming standing sink side with CGA. OT Short Term Goal 4 (Week 1): Pt will complete LB dressing with CGA. OT Short Term Goal 5 (Week 1): Pt will accurately follow 1 step commands for increased safety awareness during ADLs.  Skilled Therapeutic Interventions/Progress Updates:    Pt received in bed and agreeable to therapy. He responded well to visual gestures. He shook head no that he did not need to toilet. Taken to gym via w/c and then pt later ambulated back to his room with close S, scanning to R fairly well with only 1 cue.   In gym, worked on R hand coordination (pt able to follow directions for mmt and his strength is 5/5) with basket ball bounce and catch and dribbling ball with R hand and lifting ball with B hands over head and side to side for torso twists. Pt had a delayed reaction of R hand with B ball catch. He would actually grab the ball with his L then bring R hand to the other side.  He seems to have more of an inattention rather than coordination difficulties.  Worked on [facility] with writing activities of copying simple shapes of X, +, O, and cursive e.  Pt did well but when asked to write his name, he got the T and O then had more difficulty with the n and y.  Overall, he particpated well.  Returned to room and opted to sit in bed to rest. Bed alarm set and all needs met.    Therapy Documentation Precautions:  Precautions Precautions: Fall, Other (comment) Precaution Comments: globally  aphasic, R hemi UE> LE, R inattention Restrictions Weight Bearing Restrictions: No  Pain: Pain Assessment Pain Score: 0-No pain ADL: ADL Eating: Supervision/safety Where Assessed-Eating: Chair Grooming: Minimal assistance, Moderate cueing Where Assessed-Grooming: Sitting at sink Upper Body Bathing: Moderate cueing, Minimal assistance Where Assessed-Upper Body Bathing: Sitting at sink Lower Body Bathing: Minimal assistance, Moderate cueing Where Assessed-Lower Body Bathing: Sitting at sink Upper Body Dressing: Moderate cueing, Minimal assistance Where Assessed-Upper Body Dressing: Sitting at sink Lower Body Dressing: Minimal assistance Where Assessed-Lower Body Dressing: Sitting at sink, Standing at sink Toileting: Moderate assistance, Moderate cueing Where Assessed-Toileting: Glass blower/designer: Psychiatric nurse Method: Arts development officer: Energy manager: Not assessed Social research officer, government: Not assessed  Therapy/Group: Individual Therapy  [place] 05/18/2021, 12:38 PM

## 2021-05-18 NOTE — Progress Notes (Signed)
Patient ID: Peter Lucas  Team Conference Report to [employer] discussion was reviewed with the patient and caregiver, including goals, any changes in plan of care and target discharge date.  Patient and caregiver express understanding and are in agreement.  The patient has a target discharge date of 05/26/21.  Met with patient and sister in room, provided updates. No additional questions or concerns. SW will cont to follow up  Dyanne Iha 05/18/2021, 2:49 PM

## 2021-05-19 DIAGNOSIS — R7989 Other specified abnormal findings of blood chemistry: Secondary | ICD-10-CM

## 2021-05-19 DIAGNOSIS — I10 Essential (primary) hypertension: Secondary | ICD-10-CM

## 2021-05-19 LAB — GLUCOSE, CAPILLARY
Glucose-Capillary: 172 mg/dL — ABNORMAL HIGH (ref 70–99)
Glucose-Capillary: 176 mg/dL — ABNORMAL HIGH (ref 70–99)
Glucose-Capillary: 131 mg/dL — ABNORMAL HIGH (ref 70–99)
Glucose-Capillary: 246 mg/dL — ABNORMAL HIGH (ref 70–99)

## 2021-05-19 NOTE — Progress Notes (Signed)
Speech Language Pathology Daily Session Note  Patient Details  Name: Peter Lucas MRN: [id number] Date of Birth: 1966-08-16  Today's Date: 05/19/2021 SLP Individual Time: 1400-1430 SLP Individual Time Calculation (min): 30 min  Short Term Goals: Week 1: SLP Short Term Goal 1 (Week 1): Patient will point to major body parts with maxA cues to localize finger to point and to imitate SLP. SLP Short Term Goal 2 (Week 1): Patient will attend to at least two objects placed in front of him and point to requested object with maxA. SLP Short Term Goal 3 (Week 1): Patient will perform basic level familiar ADL tasks (feeding self, brushing teeth, etc) with maxA for safety. SLP Short Term Goal 4 (Week 1): Patient will utilize verbal and non-verbal means to communicate basic immediate wants/needs with maxA cues. SLP Short Term Goal 5 (Week 1): Patient will imitate to perform basic novel task with maxA cues.  Skilled Therapeutic Interventions:  Patient seen for second skilled ST session in PM. He had just finished OT session and did appear a little fatigued. SLP provided maxA hand over hand assist to localize finger to point to pictures in field of two but was not able to perform with any accuracy to identify objects in photos. When performing writing/copying task as we had worked on in AM, patient appeared to be getting more frustrated with this despite performing close to level he did in AM. Patient continues to benefit from skilled SLP intervention to maximize cognitive-linguistic function prior to discharge.  Pain Pain Assessment Pain Scale: Faces Faces Pain Scale: No hurt  Therapy/Group: Individual Therapy  Angela Nevin, MA, [name] Speech Therapy

## 2021-05-19 NOTE — Progress Notes (Signed)
PROGRESS NOTE   Subjective/Complaints:  Up in bed. Family at bedside. A little restless but seems comfortable.   ROS: limited due to language/communication   Objective:   No results found. No results for input(s): WBC, HGB, HCT, PLT in the last 72 hours.  Recent Labs    05/18/21 1226  NA 131*  K 4.0  CL 100  CO2 23  GLUCOSE 115*  BUN 46*  CREATININE 1.63*  CALCIUM 8.8*    Intake/Output Summary (Last 24 hours) at 05/19/2021 1144 Last data filed at 05/19/2021 0802 Gross per 24 hour  Intake 831 ml  Output --  Net 831 ml        Physical Exam: Vital Signs Blood pressure 131/70, pulse (!) 44, temperature 98.3 F (36.8 C), temperature source Oral, resp. rate 16, height 6\' 3"  (1.905 m), weight 118.1 kg, SpO2 99 %.  Constitutional: No distress . Vital signs reviewed. HEENT: EOMI, oral membranes moist Neck: supple Cardiovascular: RRR without murmur. No JVD    Respiratory/Chest: CTA Bilaterally without wheezes or rales. Normal effort    GI/Abdomen: BS +, non-tender, non-distended Ext: no clubbing, cyanosis, or edema Psych: pleasant, seems to be in good spirits.  Skin: No evidence of breakdown, no evidence of rash Neurologic: Cranial nerves II through XII intact, motor strength is 5/5 in left 4/5 RIght deltoid, bicep, tricep, grip, hip flexor, knee extensors, ankle dorsiflexor and plantar flexor Limited exam d/t receptive aphasia, "word salad". Speaks occasional appropriate phrases and words.     Musculoskeletal: Full range of motion in all 4 extremities. No joint swelling   Assessment/Plan: 1. Functional deficits which require 3+ hours per day of interdisciplinary therapy in a comprehensive inpatient rehab setting. Physiatrist is providing close team supervision and 24 hour management of active medical problems listed below. Physiatrist and rehab team continue to assess barriers to discharge/monitor patient  progress toward functional and medical goals  Care Tool:  Bathing    Body parts bathed by patient: Right arm, Chest, Abdomen, Front perineal area, Buttocks, Right upper leg, Face, Left lower leg, Right lower leg, Left upper leg, Left arm   Body parts bathed by helper: Left arm     Bathing assist Assist Level: Contact Guard/Touching assist     Upper Body Dressing/Undressing Upper body dressing   What is the patient wearing?: Pull over shirt    Upper body assist Assist Level: Minimal Assistance - Patient > 75%    Lower Body Dressing/Undressing Lower body dressing      What is the patient wearing?: Pants, Underwear/pull up     Lower body assist Assist for lower body dressing: Minimal Assistance - Patient > 75%     Toileting Toileting    Toileting assist Assist for toileting: Contact Guard/Touching assist     Transfers Chair/bed transfer  Transfers assist     Chair/bed transfer assist level: Contact Guard/Touching assist     Locomotion Ambulation   Ambulation assist      Assist level: Contact Guard/Touching assist Assistive device: Walker-rolling Max distance: >300 ft   Walk 10 feet activity   Assist     Assist level: Contact Guard/Touching assist Assistive device: No Device  Walk 50 feet activity   Assist    Assist level: Contact Guard/Touching assist Assistive device: No Device    Walk 150 feet activity   Assist    Assist level: Contact Guard/Touching assist Assistive device: No Device    Walk 10 feet on uneven surface  activity   Assist Walk 10 feet on uneven surfaces activity did not occur: Safety/medical concerns         Wheelchair     Assist Will patient use wheelchair at discharge?: No Type of Wheelchair: Manual Wheelchair activity did not occur: Safety/medical concerns         Wheelchair 50 feet with 2 turns activity    Assist    Wheelchair 50 feet with 2 turns activity did not occur: Safety/medical  concerns       Wheelchair 150 feet activity     Assist  Wheelchair 150 feet activity did not occur: Safety/medical concerns       Blood pressure 131/70, pulse (!) 44, temperature 98.3 F (36.8 C), temperature source Oral, resp. rate 16, height 6\' 3"  (1.905 m), weight 118.1 kg, SpO2 99 %.   Medical Problem List and Plan: 1.  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  Status post thrombectomy.             -patient may shower             -ELOS/Goals: 2-3 weeks- supervision to min A-    -Continue CIR therapies including PT, OT, and SLP  2.  Antithrombotics: -DVT/anticoagulation: Lovenox             -antiplatelet therapy: Aspirin 81 mg daily and Plavix 75 mg daily x90 days then aspirin alone beginning 08/08/2021 3. Pain Management: Tylenol as needed 4. Mood: Ativan 0.5 mg every 4 hours as needed anxiety             -antipsychotic agents: N/A 5. Neuropsych: This patient is not capable of making decisions on his own behalf. 6. Skin/Wound Care: Routine skin checks 7. Fluids/Electrolytes/Nutrition: Routine in and outs with follow-up chemistries 8.  Dysphagia.  Carb modified nectar thick liquid.  And advance to a regular consistency 05/13/2021 follow-up speech therapy 9.  Acute respiratory failure with hypoxia and hypercapnia.  Gram-positive cocci pneumonia.  Ceftriaxone 05/10/2021 x 5 to 7 days.  Resolved 11.  Hypertension.  HCTZ 25 mg daily, clonidine 0.2 mg every 8 hours, Norvasc 10 mg daily, lisinopril 20 mg daily.  Monitor with increased mobility Vitals:   05/19/21 0139 05/19/21 0357  BP: (!) 116/55 131/70  Pulse: (!) 50 (!) 44  Resp: 19 16  Temp: 98.6 F (37 C) 98.3 F (36.8 C)  SpO2: 97% 99%  Increased clonidine dose to .3mg  TID, brady, will check EKG, no symptoms of dizziness  6/16: bp's improving---continue with current regimen 12.  Hyperlipidemia.   Lipitor 13.  Obesity.  BMI 34.28.  Dietary follow-up 14.  Tobacco abuse.  NicoDerm patch.  Provide counseling 15.  Hypokalemia - recent K+ 4.0 16.  Pre renal azotemia- likely due to reduced fluid intake plus diuretic, give IV saline bolus x 1 slow rate, Now off HCTZ, also d/ced ACE   Still elevated start IVF at noc --recheck Friday  LOS: 7 days A FACE TO FACE EVALUATION WAS PERFORMED  05/19/2021, 11:44 AM

## 2021-05-19 NOTE — Progress Notes (Signed)
Occupational Therapy Session Note  Patient Details  Name: Peter Lucas MRN: [id number] Date of Birth: 1965-12-12  Today's Date: 05/19/2021 OT Individual Time: 1300-1400 OT Individual Time Calculation (min): 60 min    Short Term Goals: Week 1:  OT Short Term Goal 1 (Week 1): Pt will complete 3/3 toileting tasks with CGA on toilet with min cuing. OT Short Term Goal 2 (Week 1): Pt will bathe with CGA and min cuing to attend to R side of body. OT Short Term Goal 3 (Week 1): Pt will complete grooming standing sink side with CGA. OT Short Term Goal 4 (Week 1): Pt will complete LB dressing with CGA. OT Short Term Goal 5 (Week 1): Pt will accurately follow 1 step commands for increased safety awareness during ADLs.  Skilled Therapeutic Interventions/Progress Updates:    Pt received in bed with son at bedside and consented to OT tx. Son present for majority of session. Pt walked with no device and CGA from room to therapy gym, instructed in Bell cancellation test on BITs board to assess R side awareness. Unfortunately, after multiple cues and instruction, pt was unable to understand the task to cancel bells on the screen. Pt then instructed in sequencing activity on BITS board, able to sequence 1-2, however was only able to sequence A-F for the alphabet. Pt seemed to become frustrated, son reports he thinks he is also stressed about things going on at work. Pt was able to be redirected to familiar card-matching activity. Cards placed on R side of pt to increase R side attention, pt required min cuing to visually scan entire board to find cards that were placed on R side of board. Pt then expressed that he needed to use restroom, was taken back to room and completed toileting with close SUP-CGA. After tx, pt helped back to bed and left with alarm on and all needs met, awaiting SLP tx.   Therapy Documentation Precautions:  Precautions Precautions: Fall, Other (comment) Precaution Comments: globally  aphasic, R hemi UE> LE, R inattention Restrictions Weight Bearing Restrictions: No   Vital Signs: Therapy Vitals Temp: 98.4 F (36.9 C) Pulse Rate: (!) 53 Resp: 17 BP: 120/75 Patient Position (if appropriate): Sitting Oxygen Therapy SpO2: 100 % O2 Device: Room Air Pain : none     Therapy/Group: Individual Therapy  Irineo Gaulin 05/19/2021, 2:47 PM

## 2021-05-19 NOTE — Progress Notes (Addendum)
Physical Therapy Session Note  Patient Details  Name: Peter Lucas MRN: [id number] Date of Birth: 10/19/1966  Today's Date: 05/19/2021 PT Individual Time: 9509-3267 PT Individual Time Calculation (min): 65 min   Short Term Goals: Week 1:  PT Short Term Goal 1 (Week 1): Pt will perform supine <> sit consistently with CGA. PT Short Term Goal 2 (Week 1): Pt will perform safe SPVT transfers with close supervision. PT Short Term Goal 3 (Week 1): Pt will ambulate safely for at least 150 ft using LRAD with good balance.    Skilled Therapeutic Interventions/Progress Updates:     Pt propped up on L elbow, side-lying with shoes donned at start of therapy. Pt agreeable to therapy. No expression of pain throughout session.   Reaching with RUE and lateral weight shift activities with cross body movements completed with cards and horseshoes. Pt performed on level ground 4 rounds x 10 horseshoes, Airex 2 rounds x 10 horseshoes, and red wedge 2 x 12 cards for increased balance challenge. CGA provided for safety with 2 bouts of Min A with red wedge to recover COG within BOS.  Kicking soccer ball with RLE x 4 minutes with CGA.  Started with set up assistance and progressed to pt demonstrating ability to stop the ball with both the LLE and RLE.  Min A for balance while stopping ball RLE>LLE. Pt demonstrated ability to weight shift and adjust body mechanics to the ball as needed.   Gait training in halls 235ft, 15ft, 153ft with CGA/Min A verbal and visual cuing for attention to right side to safely navigate obstacles.   Dynamic gait training with weaving through 5 cones down and back x4 on level ground. Backwards (no railing) walking and side stepping in // bars to utilize one railing. 62ft x 4 side stepping and 41ft x 3 backwards/forwards walking. Pt required verbal and hand over hand assistance for RUE placement and attention on railing. Verbal cues for attention to right and manual facilitation towards the  left side to avoid cones/railing on the right.    Stairs completed 3x 8 steps with alternating foot placement, LUE support on rail, and CGA for safety.    Manual assistance 75% of time with sit<>Stand transfers on mat in between activities to stop and prevent pt from sitting on unstable surface such as rolling stool. Pt requires large visual and auditory cues for where to sit in order to maximize safety and prevent falls.   NuStep to facilitate reciprocal movement and attention to R side with verbal cues and manual assistance with attention to placement of RUE 50% of time and RLE 25% of time.   Shoes doffed EOB at end of session with tactile cuing to facilitate activity. Pt supine in bed with call bell and bed alarm on.    Therapy Documentation Precautions:  Precautions Precautions: Fall, Other (comment) Precaution Comments: globally aphasic, R hemi UE> LE, R inattention Restrictions Weight Bearing Restrictions: No    Vital Signs: Therapy Vitals Pulse Rate: (!) 56 BP: 123/78 Patient Position (if appropriate): Sitting (Mid therapy session) Oxygen Therapy SpO2: 100 % O2 Device: Room Air Pain: Pain Assessment Pain Scale: 0-10 Pain Score: 0-No pain Faces Pain Scale: No hurt    Therapy/Group: Individual Therapy  Norberto Wishon, SPT 05/19/2021, 5:42 PM

## 2021-05-19 NOTE — Progress Notes (Signed)
Speech Language Pathology Daily Session Note  Patient Details  Name: Peter Lucas MRN: [id number] Date of Birth: 11-Jun-1966  Today's Date: 05/19/2021 SLP Individual Time: 1000-1100 SLP Individual Time Calculation (min): 60 min  Short Term Goals: Week 1: SLP Short Term Goal 1 (Week 1): Patient will point to major body parts with maxA cues to localize finger to point and to imitate SLP. SLP Short Term Goal 2 (Week 1): Patient will attend to at least two objects placed in front of him and point to requested object with maxA. SLP Short Term Goal 3 (Week 1): Patient will perform basic level familiar ADL tasks (feeding self, brushing teeth, etc) with maxA for safety. SLP Short Term Goal 4 (Week 1): Patient will utilize verbal and non-verbal means to communicate basic immediate wants/needs with maxA cues. SLP Short Term Goal 5 (Week 1): Patient will imitate to perform basic novel task with maxA cues.  Skilled Therapeutic Interventions:   Patient seen for skilled ST session focusing on receptive and expressive language goals. Patient's son present at very beginning of session and he did report he has noticed improvement in language since last week. Patient was alert, sitting up in bed and receptive towards therapy. He seemed to remember SLP from initial evaluation last week, saying "Oh yeah this guy". He referred to son as "that's my brother". He was able to match colors and shapes improving from maxA to minA cues, matched object to object with mod-max cues then progressing to match object to photo with modA cues. He was able to copy at single word level with some awareness to errors when doing so. Patient unable to localize finger to point unless given hand over hand cues to perform. Overall, patient has improved in his ability to attend to tasks and to follow simple directions with modeling and visual cues. He continues to benefit from skilled SLP intervention to maximize cognitive-linguistic function  prior to discharge.  Pain Pain Assessment Pain Scale: Faces Faces Pain Scale: No hurt  Therapy/Group: Individual Therapy  Angela Nevin, MA, [name] Speech Therapy

## 2021-05-20 LAB — BASIC METABOLIC PANEL
Anion gap: 7 (ref 5–15)
BUN: 38 mg/dL — ABNORMAL HIGH (ref 6–20)
CO2: 24 mmol/L (ref 22–32)
Calcium: 9.2 mg/dL (ref 8.9–10.3)
Chloride: 102 mmol/L (ref 98–111)
Creatinine, Ser: 1.58 mg/dL — ABNORMAL HIGH (ref 0.61–1.24)
GFR, Estimated: 51 mL/min — ABNORMAL LOW (ref >60)
Glucose, Bld: 165 mg/dL — ABNORMAL HIGH (ref 70–99)
Potassium: 4.5 mmol/L (ref 3.5–5.1)
Sodium: 133 mmol/L — ABNORMAL LOW (ref 135–145)

## 2021-05-20 LAB — GLUCOSE, CAPILLARY
Glucose-Capillary: 135 mg/dL — ABNORMAL HIGH (ref 70–99)
Glucose-Capillary: 138 mg/dL — ABNORMAL HIGH (ref 70–99)
Glucose-Capillary: 143 mg/dL — ABNORMAL HIGH (ref 70–99)
Glucose-Capillary: 241 mg/dL — ABNORMAL HIGH (ref 70–99)

## 2021-05-20 NOTE — Progress Notes (Signed)
Speech Language Pathology Weekly Progress and Session Note  Patient Details  Name: Peter Lucas MRN: [id number] Date of Birth: 01/29/1966  Beginning of progress report period: 05/13/2021 End of progress report period:  05/20/2021 Today's Date: 05/20/2021 SLP Individual Time: 9093-1121 SLP Individual Time Calculation (min): 40 min  Short Term Goals: Week 1: SLP Short Term Goal 1 (Week 1): Patient will point to major body parts with maxA cues to localize finger to point and to imitate SLP. SLP Short Term Goal 1 - Progress (Week 1): Not met SLP Short Term Goal 2 (Week 1): Patient will attend to at least two objects placed in front of him and point to requested object with maxA. SLP Short Term Goal 2 - Progress (Week 1): Met SLP Short Term Goal 3 (Week 1): Patient will perform basic level familiar ADL tasks (feeding self, brushing teeth, etc) with maxA for safety. SLP Short Term Goal 3 - Progress (Week 1): Met SLP Short Term Goal 4 (Week 1): Patient will utilize verbal and non-verbal means to communicate basic immediate wants/needs with maxA cues. SLP Short Term Goal 4 - Progress (Week 1): Met SLP Short Term Goal 5 (Week 1): Patient will imitate to perform basic novel task with maxA cues. SLP Short Term Goal 5 - Progress (Week 1): Met    New Short Term Goals: Week 2: SLP Short Term Goal 1 (Week 2): STG=LTG (ELOS, discharge 6/23)  Weekly Progress Updates:  Patient met 4/5 STG's which were all set conservatively at maxA cue level. He has demonstrate significant improvement in his ability to attend to and demonstrate understanding of instructions when given verbal, visual, demonstration and modeling cues. He does appear with some improved level of awareness and does more frequently seek approval, "this what your asking?", etc. Expressive language continues to be severely impaired however patient is making some progress with receptive language abilities.    Intensity: Minumum of 1-2 x/day, 30  to 90 minutes Frequency: 3 to 5 out of 7 days Duration/Length of Stay: 6/23 Treatment/Interventions: Cognitive remediation/compensation;Cueing hierarchy;Environmental controls;Speech/Language facilitation;Internal/external aids;Patient/family education;Functional tasks   Daily Session  Skilled Therapeutic Interventions: Patient seen with son present in room for skilled ST session focusing on aphasia goals. Patient able to match object to photo in field of 2 with 80% accuracy and modA and field of 3 with  70% accuracy and mod-maxA cues. He attended to objects in field of view with mod-maxA cues and continues to not demonstrate consistent visual focus on items in front of him. He pointed to object photos in field of two when function/category named ie: "you eat this" and was 70% accurate. He wrote missing letter in his first name on dry erase board with modA cues for demonstrating understanding of task. Patient continues to benefit from skilled SLP intervention to maximize cognitive-linguistic abilities prior to discharge.    General    Pain Pain Assessment Pain Scale: Faces Pain Score: 0-No pain Faces Pain Scale: No hurt  Therapy/Group: Individual Therapy  Sonia Baller, MA, [name] Speech Therapy

## 2021-05-20 NOTE — Progress Notes (Signed)
Physical Therapy Session Note  Patient Details  Name: Peter Lucas MRN: [id number] Date of Birth: [birth year]  Today's Date: 05/20/2021 PT Individual Time: 1305-1400 PT Individual Time Calculation (min): 55 min   Short Term Goals: Week 1:  PT Short Term Goal 1 (Week 1): Pt will perform supine <> sit consistently with CGA. PT Short Term Goal 2 (Week 1): Pt will perform safe SPVT transfers with close supervision. PT Short Term Goal 3 (Week 1): Pt will ambulate safely for at least 150 ft using LRAD with good balance.   Skilled Therapeutic Interventions/Progress Updates:    Pt sitting in chair with alarm unstrapped and son in room at start of session. Pt consents to therapy and reports no pain, "just a little frustrated" today.   Pt demonstrated ability to match bean bag color to the corresponding circle color following visual demonstration and verbal cuing. Min A for pattern recognition that progressed to supervision. Red, Blue, Green, orange x 5 rounds with order and placement of color randomized. Supervision with balance. Progressed to reaching for bean bags with RUE on the R side. When asked, pt unable to verbalize the color or number.  Peg puzzle standing on blue wedge completed x2 (red/blue alt and color rows) with mod A for problem solving, spatial awareness for peg placement, error correction, and task objective. CGA to supervision to maintain balance. 2x redirecting to attend to position of R foot to prevent from sliding off wedge requiring moderate verbal, visual, and tactile cues to understand instructions.   Corn hole on wedge with CGA to supervision for balance. Pt demonstrated improvement in ability to scan towards the right side and use RUE to toss bean bag towards target, requiring verbal cuing less than 20% of the time to scan to the right. Pt able to pick up bean bags after activity with CGA to MinA with verbal and visual cuing to attend to RLE in contact with corn hole board to  prevent falling.   Dynamic balance targeted through soccer.  Pt demonstrated ability to adjust body mechanics to angle and placement of the ball with CGA to MinA in order to maintain balance especially with RLE when stopping the soccer ball. A few instances of Mod manual assistance to recover balance due to lack of spatial awareness of RLE stepping on ball.   Gait training from room to day room with CGA and verbal/visual cuing to scan R side of his environment. Pt requires intermittent manual assistance despite other cues to avoid running right side of body into the wall or door frame.  Ambulation to and from bathroom with verbal, tactile cues and manual assistance to avoid door frame on right side as well as set up of body placement in relation to toilet.   Pt demonstrates impulsivity and inattention to right side of environment with dynamic movements requiring min A to navigate environment safely and prevent RLE>RUE from making contact with obstacles which could cause him to fall.   Therapy Documentation Precautions:  Precautions Precautions: Fall, Other (comment) Precaution Comments: globally aphasic, R hemi UE> LE, R inattention Restrictions Weight Bearing Restrictions: No   Pain: Pain Assessment Pain Scale: 0-10 Pain Score: 0-No pain Faces Pain Scale: No hurt     Therapy/Group: Individual Therapy  Elnathan Fulford, SPT  05/20/2021, 5:44 PM

## 2021-05-20 NOTE — Progress Notes (Signed)
Occupational Therapy Session Note  Patient Details  Name: Peter Lucas MRN: [id number] Date of Birth: 1966/03/12  Today's Date: 05/20/2021 OT Individual Time: 7681-1572 OT Individual Time Calculation (min): 60 min   Short Term Goals: Week 1:  OT Short Term Goal 1 (Week 1): Pt will complete 3/3 toileting tasks with CGA on toilet with min cuing. OT Short Term Goal 1 - Progress (Week 1): Met OT Short Term Goal 2 (Week 1): Pt will bathe with CGA and min cuing to attend to R side of body. OT Short Term Goal 2 - Progress (Week 1): Met OT Short Term Goal 3 (Week 1): Pt will complete grooming standing sink side with CGA. OT Short Term Goal 3 - Progress (Week 1): Met OT Short Term Goal 4 (Week 1): Pt will complete LB dressing with CGA. OT Short Term Goal 4 - Progress (Week 1): Met OT Short Term Goal 5 (Week 1): Pt will accurately follow 1 step commands for increased safety awareness during ADLs. OT Short Term Goal 5 - Progress (Week 1): Progressing toward goal  Skilled Therapeutic Interventions/Progress Updates:    Pt greeted in the recliner, no s/s pain. Able to express/indicate to OT that he had already completed ADLs during previous OT session today. Declined toileting or brushing his teeth. Since ADL needs were met, tx focus was placed on dynamic standing balance, Rt attention, and NMR while ambulating in community environments. Pt ambulated without AD and CGA in the outdoor patio and atrium areas. Worked on functional word finding by naming items in the environment with phonetic and sentence finishing cues. Pt had a tough time following verbal instruction due to aphasia, did better with gestures/pointing to figure out where to ambulate/transfer. He transferred to 2 benches outside, noted that he got very close to obstacles on Rt side at times but did well to avoid them himself. Once he was returned indoors, worked on bilateral coordination/Rt [place] via folding towel, wash cloth, and pillowcases  until producing even folds with each piece of linen. His son was present and we discussed ways to make household activities, such as linen folding, therapeutic. He verbalized understanding. Pt remained sitting up in the recliner at end of session, all needs within reach and safety belt fastened.   Therapy Documentation Precautions:  Precautions Precautions: Fall, Other (comment) Precaution Comments: globally aphasic, R hemi UE> LE, R inattention Restrictions Weight Bearing Restrictions: No  ADL: ADL Eating: Supervision/safety Where Assessed-Eating: Chair Grooming: Minimal assistance, Moderate cueing Where Assessed-Grooming: Sitting at sink Upper Body Bathing: Moderate cueing, Minimal assistance Where Assessed-Upper Body Bathing: Sitting at sink Lower Body Bathing: Minimal assistance, Moderate cueing Where Assessed-Lower Body Bathing: Sitting at sink Upper Body Dressing: Moderate cueing, Minimal assistance Where Assessed-Upper Body Dressing: Sitting at sink Lower Body Dressing: Minimal assistance Where Assessed-Lower Body Dressing: Sitting at sink, Standing at sink Toileting: Moderate assistance, Moderate cueing Where Assessed-Toileting: Glass blower/designer: Psychiatric nurse Method: Arts development officer: Energy manager: Not assessed Social research officer, government: Not assessed     Therapy/Group: Individual Therapy  Marcheta Horsey A Charlea Nardo 05/20/2021, 12:10 PM

## 2021-05-20 NOTE — Progress Notes (Signed)
Occupational Therapy Weekly Progress Note  Patient Details  Name: Peter Lucas MRN: [id number] Date of Birth: 10-28-1966  Beginning of progress report period: May 13, 2021 End of progress report period: May 20, 2021  Today's Date: 05/20/2021 OT Individual Time: 0800-0900 OT Individual Time Calculation (min): 60 min    Patient has met 4 of 5 short term goals.  Pt has demo'd improved ability to follow simple commands, however is unable to comprehend instruction 25% of the time. Pt is able to understand more easily with demonstration vs verbal instruction.   Patient continues to demonstrate the following deficits: decreased attention, decreased awareness, decreased problem solving, and decreased safety awareness and decreased balance strategies and therefore will continue to benefit from skilled OT intervention to enhance overall performance with BADL and iADL.  Patient progressing toward long term goals..  Continue plan of care.  OT Short Term Goals Week 1:  OT Short Term Goal 1 (Week 1): Pt will complete 3/3 toileting tasks with CGA on toilet with min cuing. OT Short Term Goal 1 - Progress (Week 1): Met OT Short Term Goal 2 (Week 1): Pt will bathe with CGA and min cuing to attend to R side of body. OT Short Term Goal 2 - Progress (Week 1): Met OT Short Term Goal 3 (Week 1): Pt will complete grooming standing sink side with CGA. OT Short Term Goal 3 - Progress (Week 1): Met OT Short Term Goal 4 (Week 1): Pt will complete LB dressing with CGA. OT Short Term Goal 4 - Progress (Week 1): Met OT Short Term Goal 5 (Week 1): Pt will accurately follow 1 step commands for increased safety awareness during ADLs. OT Short Term Goal 5 - Progress (Week 1): Progressing toward goal  Skilled Therapeutic Interventions/Progress Updates:    Pt and family received in room and consented to OT tx. Session focused on ADL routine including bathing, dressing, functional transfer training, functional mobility  training, grooming/oral care, and cognitive training. Pt walked into bathroom with close SUP and no AD, bathed with close SUP while sitting and standing in shower with cuing for proficiency. Pt then dried off with towels provided, again with cuing to dry off completely and handed shorts. Pt req CGA to don underwear and shorts while sitting/standing, maintaining good standing balance throughout ADLs. Pt sat EOB to don shirt with cuing for proper technique to thread R arm, educated family that pt would benefit from dressing in front of a mirror at home. Pt then instructed in grooming task standing sink side with cuing for thoroughness. Pt demonstrates great improvement in overall attention to R side of body and functional use of RUE. Pt able to don footwear including socks and tennis shoes and tie laces with setup. After ADLs, pt walked down to day room with CGA and cuing to not hit R side of body on doorway. Pt then instructed in cognitive color matching and problem solving activity with board and dot game. Pt required min cuing for proper instruction, then was able to complete activity with setup and no cues. Son and daughter educated throughout session on pt's current level and progress. After tx, pt left up in recliner with son and daughter present and seatbelt alarm on, all needs met.   Therapy Documentation Precautions:  Precautions Precautions: Fall, Other (comment) Precaution Comments: globally aphasic, R hemi UE> LE, R inattention Restrictions Weight Bearing Restrictions: No     Therapy/Group: Individual Therapy  Maham Quintin 05/20/2021, 7:49 AM

## 2021-05-20 NOTE — Progress Notes (Signed)
PROGRESS NOTE   Subjective/Complaints:  Standing up at sink with OT brushing teeth. Family in room  ROS: limited due to language/communication   Objective:   No results found. No results for input(s): WBC, HGB, HCT, PLT in the last 72 hours.  Recent Labs    05/18/21 1226 05/20/21 0508  NA 131* 133*  K 4.0 4.5  CL 100 102  CO2 23 24  GLUCOSE 115* 165*  BUN 46* 38*  CREATININE 1.63* 1.58*  CALCIUM 8.8* 9.2    Intake/Output Summary (Last 24 hours) at 05/20/2021 1158 Last data filed at 05/20/2021 0840 Gross per 24 hour  Intake 840 ml  Output --  Net 840 ml        Physical Exam: Vital Signs Blood pressure 115/66, pulse (!) 49, temperature 98.2 F (36.8 C), temperature source Oral, resp. rate 18, height 6\' 3"  (1.905 m), weight 118.1 kg, SpO2 98 %.  Constitutional: No distress . Vital signs reviewed. HEENT: EOMI, oral membranes moist Neck: supple Cardiovascular: RRR without murmur. No JVD    Respiratory/Chest: CTA Bilaterally without wheezes or rales. Normal effort    GI/Abdomen: BS +, non-tender, non-distended Ext: no clubbing, cyanosis, or edema Psych: pleasant and in good spirits..  Skin: No evidence of breakdown, no evidence of rash Neurologic: Cranial nerves II through XII intact, motor strength is 5/5 in left 4/5 RIght deltoid, bicep, tricep, grip, hip flexor, knee extensors, ankle dorsiflexor and plantar flexor ongoing receptive aphasia, "word salad" but definitely demonstrates comprehension and purposeful responses to verbal cueing. Good standing balance  Musculoskeletal: Full range of motion in all 4 extremities. No joint swelling   Assessment/Plan: 1. Functional deficits which require 3+ hours per day of interdisciplinary therapy in a comprehensive inpatient rehab setting. Physiatrist is providing close team supervision and 24 hour management of active medical problems listed below. Physiatrist and  rehab team continue to assess barriers to discharge/monitor patient progress toward functional and medical goals  Care Tool:  Bathing    Body parts bathed by patient: Right arm, Chest, Abdomen, Front perineal area, Buttocks, Right upper leg, Face, Left lower leg, Right lower leg, Left upper leg, Left arm   Body parts bathed by helper: Left arm     Bathing assist Assist Level: Contact Guard/Touching assist     Upper Body Dressing/Undressing Upper body dressing   What is the patient wearing?: Pull over shirt    Upper body assist Assist Level: Contact Guard/Touching assist    Lower Body Dressing/Undressing Lower body dressing      What is the patient wearing?: Pants, Underwear/pull up     Lower body assist Assist for lower body dressing: Contact Guard/Touching assist (standing)     Toileting Toileting    Toileting assist Assist for toileting: Contact Guard/Touching assist     Transfers Chair/bed transfer  Transfers assist     Chair/bed transfer assist level: Contact Guard/Touching assist     Locomotion Ambulation   Ambulation assist      Assist level: Contact Guard/Touching assist Assistive device: Walker-rolling Max distance: >300 ft   Walk 10 feet activity   Assist     Assist level: Contact Guard/Touching assist Assistive device: No  Device   Walk 50 feet activity   Assist    Assist level: Contact Guard/Touching assist Assistive device: No Device    Walk 150 feet activity   Assist    Assist level: Contact Guard/Touching assist Assistive device: No Device    Walk 10 feet on uneven surface  activity   Assist Walk 10 feet on uneven surfaces activity did not occur: Safety/medical concerns         Wheelchair     Assist Will patient use wheelchair at discharge?: No Type of Wheelchair: Manual Wheelchair activity did not occur: Safety/medical concerns         Wheelchair 50 feet with 2 turns activity    Assist     Wheelchair 50 feet with 2 turns activity did not occur: Safety/medical concerns       Wheelchair 150 feet activity     Assist  Wheelchair 150 feet activity did not occur: Safety/medical concerns       Blood pressure 115/66, pulse (!) 49, temperature 98.2 F (36.8 C), temperature source Oral, resp. rate 18, height 6\' 3"  (1.905 m), weight 118.1 kg, SpO2 98 %.   Medical Problem List and Plan: 1.  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  Status post thrombectomy.             -patient may shower             -ELOS/Goals: 2-3 weeks- supervision to min A-    -Continue CIR therapies including PT, OT, and SLP   -pt's twin sons may both come up for Father's Day visit 2.  Antithrombotics: -DVT/anticoagulation: Lovenox             -antiplatelet therapy: Aspirin 81 mg daily and Plavix 75 mg daily x90 days then aspirin alone beginning 08/08/2021 3. Pain Management: Tylenol as needed 4. Mood: Ativan 0.5 mg every 4 hours as needed anxiety             -antipsychotic agents: N/A 5. Neuropsych: This patient is not capable of making decisions on his own behalf. 6. Skin/Wound Care: Routine skin checks 7. Fluids/Electrolytes/Nutrition: Routine in and outs with follow-up chemistries 8.  Dysphagia.  on regular diet now 9.  Acute respiratory failure with hypoxia and hypercapnia.  Gram-positive cocci pneumonia.  Ceftriaxone 05/10/2021 x 5 to 7 days.  Resolved 11.  Hypertension.  HCTZ 25 mg daily, clonidine 0.2 mg every 8 hours, Norvasc 10 mg daily, lisinopril 20 mg daily.  Monitor with increased mobility Vitals:   05/19/21 1923 05/20/21 0346  BP: (!) 103/56 115/66  Pulse: (!) 46 (!) 49  Resp: 16 18  Temp: 98.6 F (37 C) 98.2 F (36.8 C)  SpO2: 98% 98%  Increased clonidine dose to .3mg  TID, brady, will check EKG, no symptoms of dizziness  6/17: bp's improved---continue with  current regimen 12.  Hyperlipidemia.  Lipitor 13.  Obesity.  BMI 34.28.  Dietary follow-up 14.  Tobacco abuse.  NicoDerm patch.  Provide counseling 15.  Hypokalemia - recent K+ 4.0 16.  Pre renal azotemia- likely due to reduced fluid intake plus diuretic,  Now off HCTZ, also d/ced ACE    6/17-improved BUN/Cr 38/1.58  -continue to push fluids  -recheck Monday  LOS: 8 days A FACE TO FACE EVALUATION WAS PERFORMED  02-14-1972 05/20/2021, 11:58 AM

## 2021-05-21 LAB — GLUCOSE, CAPILLARY
Glucose-Capillary: 197 mg/dL — ABNORMAL HIGH (ref 70–99)
Glucose-Capillary: 137 mg/dL — ABNORMAL HIGH (ref 70–99)
Glucose-Capillary: 149 mg/dL — ABNORMAL HIGH (ref 70–99)
Glucose-Capillary: 181 mg/dL — ABNORMAL HIGH (ref 70–99)

## 2021-05-21 MED ORDER — SORBITOL 70 % SOLN
30.0000 mL | Freq: Every day | Status: DC | PRN
  Administered 2021-05-21 – 2021-05-22 (×2): 30 mL via ORAL
  Filled 2021-05-21 (×2): qty 30

## 2021-05-22 LAB — GLUCOSE, CAPILLARY
Glucose-Capillary: 172 mg/dL — ABNORMAL HIGH (ref 70–99)
Glucose-Capillary: 93 mg/dL (ref 70–99)
Glucose-Capillary: 218 mg/dL — ABNORMAL HIGH (ref 70–99)
Glucose-Capillary: 217 mg/dL — ABNORMAL HIGH (ref 70–99)

## 2021-05-22 NOTE — Progress Notes (Signed)
PROGRESS NOTE   Subjective/Complaints:    ROS: limited due to language/communication   Objective:   No results found. No results for input(s): WBC, HGB, HCT, PLT in the last 72 hours.  Recent Labs    05/20/21 0508  NA 133*  K 4.5  CL 102  CO2 24  GLUCOSE 165*  BUN 38*  CREATININE 1.58*  CALCIUM 9.2     Intake/Output Summary (Last 24 hours) at 05/22/2021 0642 Last data filed at 05/21/2021 1759 Gross per 24 hour  Intake 960 ml  Output --  Net 960 ml         Physical Exam: Vital Signs Blood pressure 126/75, pulse (!) 49, temperature 97.9 F (36.6 C), temperature source Oral, resp. rate 20, height 6\' 3"  (1.905 m), weight 118.1 kg, SpO2 98 %.   General: No acute distress Mood and affect are appropriate Heart: Regular rate and rhythm no rubs murmurs or extra sounds Lungs: Clear to auscultation, breathing unlabored, no rales or wheezes Abdomen: Positive bowel sounds, soft nontender to palpation, nondistended Extremities: No clubbing, cyanosis, or edema Skin: No evidence of breakdown, no evidence of rash   Skin: No evidence of breakdown, no evidence of rash Neurologic: Cranial nerves II through XII intact, motor strength is 5/5 in left 4/5 RIght deltoid, bicep, tricep, grip, hip flexor, knee extensors, ankle dorsiflexor and plantar flexor ongoing receptive aphasia, "word salad" but definitely demonstrates comprehension and purposeful responses to verbal cueing. Good standing balance  Musculoskeletal: Full range of motion in all 4 extremities. No joint swelling   Assessment/Plan: 1. Functional deficits which require 3+ hours per day of interdisciplinary therapy in a comprehensive inpatient rehab setting. Physiatrist is providing close team supervision and 24 hour management of active medical problems listed below. Physiatrist and rehab team continue to assess barriers to discharge/monitor patient progress  toward functional and medical goals  Care Tool:  Bathing    Body parts bathed by patient: Right arm, Chest, Abdomen, Front perineal area, Buttocks, Right upper leg, Face, Left lower leg, Right lower leg, Left upper leg, Left arm   Body parts bathed by helper: Left arm     Bathing assist Assist Level: Contact Guard/Touching assist     Upper Body Dressing/Undressing Upper body dressing   What is the patient wearing?: Pull over shirt    Upper body assist Assist Level: Contact Guard/Touching assist    Lower Body Dressing/Undressing Lower body dressing      What is the patient wearing?: Pants, Underwear/pull up     Lower body assist Assist for lower body dressing: Contact Guard/Touching assist (standing)     Toileting Toileting    Toileting assist Assist for toileting: Contact Guard/Touching assist     Transfers Chair/bed transfer  Transfers assist     Chair/bed transfer assist level: Contact Guard/Touching assist     Locomotion Ambulation   Ambulation assist      Assist level: Contact Guard/Touching assist Assistive device: Walker-rolling Max distance: >300 ft   Walk 10 feet activity   Assist     Assist level: Contact Guard/Touching assist Assistive device: No Device   Walk 50 feet activity   Assist    Assist  level: Contact Guard/Touching assist Assistive device: No Device    Walk 150 feet activity   Assist    Assist level: Contact Guard/Touching assist Assistive device: No Device    Walk 10 feet on uneven surface  activity   Assist Walk 10 feet on uneven surfaces activity did not occur: Safety/medical concerns         Wheelchair     Assist Will patient use wheelchair at discharge?: No Type of Wheelchair: Manual Wheelchair activity did not occur: Safety/medical concerns         Wheelchair 50 feet with 2 turns activity    Assist    Wheelchair 50 feet with 2 turns activity did not occur: Safety/medical  concerns       Wheelchair 150 feet activity     Assist  Wheelchair 150 feet activity did not occur: Safety/medical concerns       Blood pressure 126/75, pulse (!) 49, temperature 97.9 F (36.6 C), temperature source Oral, resp. rate 20, height 6\' 3"  (1.905 m), weight 118.1 kg, SpO2 98 %.   Medical Problem List and Plan: 1.  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  Status post thrombectomy.             -patient may shower             -ELOS/Goals: 2-3 weeks- supervision to min A-    -Continue CIR therapies including PT, OT, and SLP   -pt's twin sons may both come up for Father's Day visit 2.  Antithrombotics: -DVT/anticoagulation: Lovenox             -antiplatelet therapy: Aspirin 81 mg daily and Plavix 75 mg daily x90 days then aspirin alone beginning 08/08/2021 3. Pain Management: Tylenol as needed 4. Mood: Ativan 0.5 mg every 4 hours as needed anxiety             -antipsychotic agents: N/A 5. Neuropsych: This patient is not capable of making decisions on his own behalf. 6. Skin/Wound Care: Routine skin checks 7. Fluids/Electrolytes/Nutrition: Routine in and outs with follow-up chemistries 8.  Dysphagia.  on regular diet now 9.  Acute respiratory failure with hypoxia and hypercapnia.  Gram-positive cocci pneumonia.  Ceftriaxone 05/10/2021 x 5 to 7 days.  Resolved 11.  Hypertension.  HCTZ 25 mg daily, clonidine 0.2 mg every 8 hours, Norvasc 10 mg daily, lisinopril 20 mg daily.  Monitor with increased mobility Vitals:   05/21/21 2257 05/22/21 0325  BP: 118/63 126/75  Pulse: (!) 48 (!) 49  Resp:  20  Temp:  97.9 F (36.6 C)  SpO2:  98%  Controlled 6/19 12.  Hyperlipidemia.  Lipitor 13.  Obesity.  BMI 34.28.  Dietary follow-up 14.  Tobacco abuse.  NicoDerm patch.  Provide counseling 15.  Hypokalemia - recent K+ 4.0 16.  Pre renal azotemia- likely due to  reduced fluid intake plus diuretic,  Now off HCTZ, also d/ced ACE    6/17-improved BUN/Cr 38/1.58  -continue to push fluids  -recheck Monday  LOS: 10 days A FACE TO FACE EVALUATION WAS PERFORMED  Thursday 05/22/2021, 6:42 AM

## 2021-05-22 NOTE — Progress Notes (Signed)
Occupational Therapy Session Note  Patient Details  Name: Peter Lucas MRN: [id number] Date of Birth: July 07, 1966  Today's Date: 05/22/2021 OT Individual Time: 0903-1000 OT Individual Time Calculation (min): 57 min    Short Term Goals: Week 2:  OT Short Term Goal 1 (Week 2): STGs=LTGs 2/2 ELOS  Skilled Therapeutic Interventions/Progress Updates:    Session 1: (0903-1000)  Pt in recliner to start session agreeable to shower and dressing tasks.  He needed min to mod demonstrational cueing to initiate task.  When asked questions he was able to verbally respond but demonstrates language of confusion as his statements do not give answers to the questions given.  He was able to remove all clothing in standing and completed showering in standing as well with mod demonstrational cueing for initiation of using soap, as he would just rinse off with the water.  He transferred out to the dresser for gathering clothing for dressing at [facility] demonstrational cueing.  He was able to sit on the chair for donning items over his feet with supervision to stand and pull them up over her hips.  He sat in the chair as well for donning his socks and shoes with increased time secondary to sequencing deficits as he initially attempted to donn his shoes without socks, and then realized his error and corrected it.  He was able to stand at the sink for brushing his hair and teeth with motor planning deficits noted when attempting to setup his toothbrush as he kept trying to squirt the toothpaste in his hand.  Once ADL tasks were complete, he ambulated down to the ortho gym with supervision and no device.  Had him complete the BITS for two sets, one Visual Scanning and the other Cognitive Sequencing.  He was able to stand and use the RUE with an average time of 2.4 seconds with 87% accuracy during visual scanning.  Attempted 3 word sequencing with voice commands, but pt unable to accurately complete even with therapist stating words  as well.  Asked him to press the word on the screen as therapist stated it and he was unable to.  Finished session with return to the room with min demonstrational cueing for direction.  Noted pt with some episodes of bumping into or stepping close to something on the right side during mobility.  Finished with pt resting in the recliner and with the call button and phone in reach and safety alarm in place.    Session 2: (1324-4010) Pt's sons and daughter in to visit at start of session.  Discussed with them his current level this am with ADL tasks and therapy.  They voice understanding and are working on arranging 24 hr supervision at discharge, but were disappointed that they were not notified sooner of his discharge date.  Discussed with daughter that SW usually lets family know this, however she did not receive a call after conference.  Attempted to have pt state his daughter's name, however he was unable.  He was not able to state his son's names either, but was able to recognize them and give them hugs on our way out to therapy.  Had him work on functional mobility without an assistive device to the gym.  He then stood at the [facility] to complete ball toss and catch with kick ball while working on counting reciprocally with therapist.  He needed mod instructional cueing with gesturing but was able to count up to 25 while tossing the ball.  He then would  repeatedly drop back to stating "15".  Also attempted to do this with stating the alphabet, but he was unsuccessful.  Next, had him try to identify a state letter from 3 placed on the table.  He was unable to complete. This was the same when given three numbers as well.  He was unable to correctly point to the number when therapist stated it.  Had him transition to ping ball sorting task in sitting, matching the correct sequence of balls with the diagram given.  He was able to complete task with 80% accuracy on 3 attempts but needed min assist to fix errors.   Incorporated visual scanning to the right to look at the diagram as well as integrating the RUE 30% of the time to pick up and arrange the balls.  Finished with completion of Nine Hole Peg Testing.  He was able to complete in 31 seconds on the left and 64 seconds on the right.  Had him ambulate back to the room where he was left sitting in his recliner with his daughter present.  Call button and phone in reach with safety alarm in place.    Therapy Documentation Precautions:  Precautions Precautions: Fall, Other (comment) Precaution Comments: globally aphasic, R hemi UE> LE, R inattention Restrictions Weight Bearing Restrictions: No  Pain: Pain Assessment Pain Scale: Faces Pain Score: 0-No pain ADL: See Care Tool Section for some details of mobility and selfcare   Therapy/Group: Individual Therapy  Peter Lucas OTR/L 05/22/2021, 10:36 AM

## 2021-05-23 LAB — GLUCOSE, CAPILLARY
Glucose-Capillary: 172 mg/dL — ABNORMAL HIGH (ref 70–99)
Glucose-Capillary: 133 mg/dL — ABNORMAL HIGH (ref 70–99)
Glucose-Capillary: 130 mg/dL — ABNORMAL HIGH (ref 70–99)
Glucose-Capillary: 219 mg/dL — ABNORMAL HIGH (ref 70–99)

## 2021-05-23 LAB — BASIC METABOLIC PANEL
Anion gap: 11 (ref 5–15)
BUN: 27 mg/dL — ABNORMAL HIGH (ref 6–20)
CO2: 23 mmol/L (ref 22–32)
Calcium: 9.4 mg/dL (ref 8.9–10.3)
Chloride: 99 mmol/L (ref 98–111)
Creatinine, Ser: 1.51 mg/dL — ABNORMAL HIGH (ref 0.61–1.24)
GFR, Estimated: 54 mL/min — ABNORMAL LOW (ref >60)
Glucose, Bld: 165 mg/dL — ABNORMAL HIGH (ref 70–99)
Potassium: 4.5 mmol/L (ref 3.5–5.1)
Sodium: 133 mmol/L — ABNORMAL LOW (ref 135–145)

## 2021-05-23 MED ORDER — SORBITOL 70 % SOLN
30.0000 mL | Freq: Once | Status: AC
  Administered 2021-05-23: 30 mL via ORAL
  Filled 2021-05-23: qty 30

## 2021-05-23 NOTE — Progress Notes (Signed)
PROGRESS NOTE   Subjective/Complaints:  Pt said spontaneous "im fine"- I don't hurt- wasn't able to answer when had LBM- not able to understand the question.  No BM documented since 6/16 at least- cannot find one documented.    ROS: limited due to aphasia  Objective:   No results found. No results for input(s): WBC, HGB, HCT, PLT in the last 72 hours.  Recent Labs    05/23/21 0656  NA 133*  K 4.5  CL 99  CO2 23  GLUCOSE 165*  BUN 27*  CREATININE 1.51*  CALCIUM 9.4    Intake/Output Summary (Last 24 hours) at 05/23/2021 1944 Last data filed at 05/23/2021 1807 Gross per 24 hour  Intake 960 ml  Output --  Net 960 ml        Physical Exam: Vital Signs Blood pressure 121/82, pulse (!) 43, temperature 97.8 F (36.6 C), temperature source Oral, resp. rate 18, height 6\' 3"  (1.905 m), weight 118.1 kg, SpO2 100 %.    General: awake, alert, appropriate, sitting in bedside chair, safety belt in place, NAD HENT: conjugate gaze; oropharynx moist CV: bradycardic rate; no JVD Pulmonary: CTA B/L; no W/R/R- good air movement GI: soft, NT, ND, (+)BS Psychiatric: appropriate; but cannot understand Neurological: aphasic- esp receptive aphasic.   Skin: No evidence of breakdown, no evidence of rash Neurologic: Cranial nerves II through XII intact, motor strength is 5/5 in left 4/5 RIght deltoid, bicep, tricep, grip, hip flexor, knee extensors, ankle dorsiflexor and plantar flexor ongoing receptive aphasia, "word salad" but definitely demonstrates comprehension and purposeful responses to verbal cueing. Good standing balance  Musculoskeletal: Full range of motion in all 4 extremities. No joint swelling   Assessment/Plan: 1. Functional deficits which require 3+ hours per day of interdisciplinary therapy in a comprehensive inpatient rehab setting. Physiatrist is providing close team supervision and 24 hour management of  active medical problems listed below. Physiatrist and rehab team continue to assess barriers to discharge/monitor patient progress toward functional and medical goals  Care Tool:  Bathing    Body parts bathed by patient: Right arm, Chest, Abdomen, Front perineal area, Buttocks, Right upper leg, Face, Left lower leg, Right lower leg, Left upper leg, Left arm   Body parts bathed by helper: Left arm     Bathing assist Assist Level: Supervision/Verbal cueing     Upper Body Dressing/Undressing Upper body dressing   What is the patient wearing?: Pull over shirt    Upper body assist Assist Level: Supervision/Verbal cueing    Lower Body Dressing/Undressing Lower body dressing      What is the patient wearing?: Pants, Underwear/pull up     Lower body assist Assist for lower body dressing: Contact Guard/Touching assist (standing)     Toileting Toileting    Toileting assist Assist for toileting: Contact Guard/Touching assist     Transfers Chair/bed transfer  Transfers assist     Chair/bed transfer assist level: Supervision/Verbal cueing     Locomotion Ambulation   Ambulation assist      Assist level: Supervision/Verbal cueing Assistive device: No Device Max distance: 200'   Walk 10 feet activity   Assist     Assist  level: Contact Guard/Touching assist Assistive device: No Device   Walk 50 feet activity   Assist    Assist level: Contact Guard/Touching assist Assistive device: No Device    Walk 150 feet activity   Assist    Assist level: Contact Guard/Touching assist Assistive device: No Device    Walk 10 feet on uneven surface  activity   Assist Walk 10 feet on uneven surfaces activity did not occur: Safety/medical concerns         Wheelchair     Assist Will patient use wheelchair at discharge?: No Type of Wheelchair: Manual Wheelchair activity did not occur: Safety/medical concerns         Wheelchair 50 feet with 2 turns  activity    Assist    Wheelchair 50 feet with 2 turns activity did not occur: Safety/medical concerns       Wheelchair 150 feet activity     Assist  Wheelchair 150 feet activity did not occur: Safety/medical concerns       Blood pressure 121/82, pulse (!) 43, temperature 97.8 F (36.6 C), temperature source Oral, resp. rate 18, height 6\' 3"  (1.905 m), weight 118.1 kg, SpO2 100 %.   Medical Problem List and Plan: 1.  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  Status post thrombectomy.             -patient may shower             -ELOS/Goals: 2-3 weeks- supervision to min A-    -Continue CIR- PT, OT and SLP  2.  Antithrombotics: -DVT/anticoagulation: Lovenox             -antiplatelet therapy: Aspirin 81 mg daily and Plavix 75 mg daily x90 days then aspirin alone beginning 08/08/2021 3. Pain Management: Tylenol as needed 4. Mood: Ativan 0.5 mg every 4 hours as needed anxiety             -antipsychotic agents: N/A 5. Neuropsych: This patient is not capable of making decisions on his own behalf. 6. Skin/Wound Care: Routine skin checks 7. Fluids/Electrolytes/Nutrition: Routine in and outs with follow-up chemistries 8.  Dysphagia.  on regular diet now 9.  Acute respiratory failure with hypoxia and hypercapnia.  Gram-positive cocci pneumonia.  Ceftriaxone 05/10/2021 x 5 to 7 days.  Resolved 11.  Hypertension.  HCTZ 25 mg daily, clonidine 0.2 mg every 8 hours, Norvasc 10 mg daily, lisinopril 20 mg daily.  Monitor with increased mobility Vitals:   05/23/21 0829 05/23/21 1428  BP: 122/76 121/82  Pulse: (!) 49 (!) 43  Resp:  18  Temp:  97.8 F (36.6 C)  SpO2:  100%   6/20- BP controlled but bradycardic- con't regimen 12.  Hyperlipidemia.  Lipitor 13.  Obesity.  BMI 34.28.  Dietary follow-up 14.  Tobacco abuse.  NicoDerm patch.  Provide counseling 15.   Hypokalemia - recent K+ 4.0 16.  Pre renal azotemia- likely due to reduced fluid intake plus diuretic,  Now off HCTZ, also d/ced ACE    6/17-improved BUN/Cr 38/1.58  -continue to push fluids  6/20- BUN 28 and Cr 1.51- somewhat better- push fluids.  17. Constipation  6/20- LBM at least 5 days ago- will order Sorbitol.   LOS: 11 days A FACE TO FACE EVALUATION WAS PERFORMED  Peter Lucas 05/23/2021, 7:44 PM

## 2021-05-23 NOTE — Progress Notes (Signed)
Patient ID: Peter Lucas, male   DOB: [birth year], 55 y.o.   MRN: [id number]  SW covering for primary SW, Lavera Guise.  Pt dtr [name] reported she would like follow-up with her going forward since she is the primary contact. SW informed will relay information to SW once she returns to office tomorrow.   Cecile Sheerer, MSW, LCSWA Office: [phone] Cell: [phone] Fax: [phone]

## 2021-05-23 NOTE — Progress Notes (Signed)
Speech Language Pathology Daily Session Note  Patient Details Today's Date: 05/23/2021 SLP Individual Time: 9150-5697 SLP Individual Time Calculation (min): 45 min Name: Peter Lucas MRN: [id number] Date of Birth: February 06, 1966  Today's Date: 05/23/2021 SLP Individual Time: 1118-1203 SLP Individual Time Calculation (min): 45 min  Short Term Goals: Week 2: SLP Short Term Goal 1 (Week 2): STG=LTG (ELOS, discharge 6/23)  Skilled Therapeutic Interventions:Skilled ST services focused on language skills. Pt was wearing novel reader glass family provided this weekend, which appeared to have a large impact on pt's ability to focus on objects/images and demonstrated overall increase receptive language skills. Pt was able to match in fields of 3 objects to photographs, black/white pictures, black/white action pictures, words and phrases with 100% accuracy given 3 opportunities for each. Pt was unable to read when matching at word level, however was named object/function in 2 out 5 opportunities when matching phrase to objects. Pt was able to name 2 out 5 objects on command with sentence completion and phonetic cues. Pt is demonstrating increase in error awareness. Pt was able to write first name independently mod I and able to trace 2 letters in last name on white board. SLP provided education to pt's two present children how to cue in witting tasks and to continue practice his name. Pt was left in room with call bell within reach and bed alarm set. SLP recommends to continue skilled services.     Pain Pain Assessment Pain Scale: 0-10 Pain Score: 0-No pain  Therapy/Group: Individual Therapy  Aliciana Ricciardi  [facility] 05/23/2021, 12:09 PM

## 2021-05-23 NOTE — Progress Notes (Signed)
Speech Language Pathology Daily Session Note  Patient Details  Name: Peter Lucas MRN: [id number] Date of Birth: 1966/08/05  Today's Date: 05/23/2021 SLP Individual Time: 1330-1415 SLP Individual Time Calculation (min): 45 min  Short Term Goals: Week 2: SLP Short Term Goal 1 (Week 2): STG=LTG (ELOS, discharge 6/23)  Skilled Therapeutic Interventions: Skilled ST services performed with focus on communication goals. SLP facilitated object naming task using [employer] Naming app on iPad. Patient achieved 15% (3/19) objects independently, 22% when given phonemic cue, 68% when given carrier phrase and written word cues. Patient exhibited difficulty writing his name on dry erase board. He was able to select appropriate missing letter (intentional) with 85% accuracy given field of 2 written choices with family member names. Patient was able to describe photographs of family members at word and sentence level with min-to-mod assist from his son for accuracy of names. Phonemic paraphasias and perseveration noted both during structured language tasks and natural exchange. Educated patient's son strategies to enhance word finding and use of dry erase board to validate word finding, as evidenced during naming task on iPad. Patient was left in chair with alarm activated and needs within reach. Continue with skilled services per plan of care.  Pain Pain Assessment Pain Scale: 0-10 Pain Score: 0-No pain  Therapy/Group: Individual Therapy  Peter Lucas 05/23/2021, 3:52 PM

## 2021-05-23 NOTE — Progress Notes (Signed)
Occupational Therapy Session Note  Patient Details  Name: Peter Lucas MRN: [id number] Date of Birth: [birth year]  Today's Date: 05/23/2021 OT Individual Time: 5956-3875 OT Individual Time Calculation (min): 73 min    Short Term Goals: Week 1:  OT Short Term Goal 1 (Week 1): Pt will complete 3/3 toileting tasks with CGA on toilet with min cuing. OT Short Term Goal 1 - Progress (Week 1): Met OT Short Term Goal 2 (Week 1): Pt will bathe with CGA and min cuing to attend to R side of body. OT Short Term Goal 2 - Progress (Week 1): Met OT Short Term Goal 3 (Week 1): Pt will complete grooming standing sink side with CGA. OT Short Term Goal 3 - Progress (Week 1): Met OT Short Term Goal 4 (Week 1): Pt will complete LB dressing with CGA. OT Short Term Goal 4 - Progress (Week 1): Met OT Short Term Goal 5 (Week 1): Pt will accurately follow 1 step commands for increased safety awareness during ADLs. OT Short Term Goal 5 - Progress (Week 1): Progressing toward goal Week 2:  OT Short Term Goal 1 (Week 2): STGs=LTGs 2/2 ELOS  Skilled Therapeutic Interventions/Progress Updates:    Pt received in recliner in room and consented to OT tx. Pt able to don shoes and tie with mod I. Pt walked to bathroom and completed toileting with SUP, walked out to wash hands and groom standing sink side with SUP. Pt then walked to day room with CGA/close SUP, cuing to avoid hitting R side of body on doorways. Pt instructed in [facility] task with focus on attending to R side and using RUE. Pt was able to complete activity standing with increased time to address R side of cork board during tack activity.   Pt then instructed in [place] game for problem solving, coordination, and fine motor skills. Pt did well during activity and was able to follow all of game instructions properly. Pt then instructed in cognitive color board with matching reference for increased problem solving skills and executive functioning. Pt able to complete 4  activities of cognitive color board with increased time and no cues for accuracy. Pt then instructed in standing card matching game with cards placed on R side of pt and references placed on their sides/in different positions to upgrade the task cognitively. Pt req increased time to understand new instructions/positions of cards, but was able to complete with close SUP.   Pt then instructed in 4# dowel rod exercises for B coordination. Pt instructed in shoulder press, chest press for 1x15 with min cuing for proper tech with good carryover. After tx, pt walked back to room with SUP and no device back to room and left up in recliner with seatbelt alarm on and all needs met.   Therapy Documentation Precautions:  Precautions Precautions: Fall, Other (comment) Precaution Comments: globally aphasic, R hemi UE> LE, R inattention Restrictions Weight Bearing Restrictions: No  Vital Signs: Therapy Vitals Temp: 97.7 F (36.5 C) Pulse Rate: (!) 46 Resp: 18 BP: 139/80 Patient Position (if appropriate): Lying Oxygen Therapy SpO2: 100 % O2 Device: Room Air Pain: none    Therapy/Group: Individual Therapy  Heru Montz 05/23/2021, 8:01 AM

## 2021-05-23 NOTE — Progress Notes (Signed)
Physical Therapy Session Note  Patient Details  Name: Peter Lucas MRN: [id number] Date of Birth: [birth year]  Today's Date: 05/23/2021 PT Individual Time: 6712-4580 and 9983-3825 PT Individual Time Calculation (min): 41 min and 31 min   Short Term Goals: Week 1:  PT Short Term Goal 1 (Week 1): Pt will perform supine <> sit consistently with CGA. PT Short Term Goal 2 (Week 1): Pt will perform safe SPVT transfers with close supervision. PT Short Term Goal 3 (Week 1): Pt will ambulate safely for at least 150 ft using LRAD with good balance.  Skilled Therapeutic Interventions/Progress Updates:  Session 1: Patient seated in recliner on entrance to room. Dtr present. Patient alert and agreeable to PT session. Patient denied pain during session.  Therapeutic Activity: Transfers: Patient performed STS and SPVT transfers to no AD with supervision. Provided vc for scanning to R side.  Gait Training:  Patient ambulated 120' x2 using no AD with CGA. Provided vc/ tc for scanning to R side throughout with one instance of inability to maneuver obstacle to R side.   Neuromuscular Re-ed: NMR facilitated during session with focus on global movements, following instructions, scanning to R side. Pt guided in lateral ambulatory minisquats x2, forward lunges x2, touching of ball held with BUE to lateral targets for spinal rotation, seated rise to overhead press with 4# weighted bar, and forward step and reach to target. No LOB noted. Requires visual demonstration to perform each requested movement.   NMR performed for improvements in motor control and coordination, balance, sequencing, judgement, and self confidence/ efficacy in performing all aspects of mobility at highest level of independence.   Patient seated upright in recliner at end of session with brakes locked, seat alarm set, and all needs within reach.     Session 2: Patient seated upright in recliner on entrance to room. Patient alert and  agreeable to PT session. Patient denied pain during session. Son has left note for potential to ambulate outside for afternoon PT session.   Therapeutic Activity: Gait Training:  Patient ambulated >420' over uneven pavement up and down graded surface and over transitioning thresholds from outdoor to indoor using no AD with light CGA and consistent vc for scanning to R side in order to prevent running into wall or railing on R. Provided vc/ tc for managing speed, maintaining upright posture throughout. Pt very close to wall on R side throughout.   Neuromuscular Re-ed: NMR facilitated during session with focus on standing balance. Pt guided in use of managing STS without use of BUE and placing hands on thighs for rise to stand as well as descent to sit for improved motor control and smooth performance. NMR performed for improvements in motor control and coordination, balance, sequencing, judgement, and self confidence/ efficacy in performing all aspects of mobility at highest level of independence.   Patient seated  in recliner at end of session with brakes locked, seat alarm set, and all needs within reach.   Therapy Documentation Precautions:  Precautions Precautions: Fall, Other (comment) Precaution Comments: globally aphasic, R hemi UE> LE, R inattention Restrictions Weight Bearing Restrictions: No  Therapy/Group: Individual Therapy  Loel Dubonnet PT, DPT 05/23/2021, 7:05 PM

## 2021-05-23 NOTE — Plan of Care (Signed)
  Problem: Consults Goal: RH STROKE PATIENT EDUCATION Description: See Patient Education module for education specifics  Outcome: Progressing   Problem: RH BOWEL ELIMINATION Goal: RH STG MANAGE BOWEL WITH ASSISTANCE Description: STG Manage Bowel with mod I Assistance. Outcome: Progressing Goal: RH STG MANAGE BOWEL W/MEDICATION W/ASSISTANCE Description: STG Manage Bowel with Medication with mod I  Assistance. Outcome: Progressing   Problem: RH BLADDER ELIMINATION Goal: RH STG MANAGE BLADDER WITH ASSISTANCE Description: STG Manage Bladder With min Assistance Outcome: Progressing   Problem: RH SAFETY Goal: RH STG ADHERE TO SAFETY PRECAUTIONS W/ASSISTANCE/DEVICE Description: STG Adhere to Safety Precautions With  cues/reminders Assistance/Device. Outcome: Progressing   Problem: RH PAIN MANAGEMENT Goal: RH STG PAIN MANAGED AT OR BELOW PT'S PAIN GOAL Description: At or below level 4 Outcome: Progressing   Problem: RH KNOWLEDGE DEFICIT Goal: RH STG INCREASE KNOWLEDGE OF DIABETES Description:   Patient will be able to manage DM with medications and dietary modifications using handouts and educational resources with cues/reminders Outcome: Progressing Goal: RH STG INCREASE KNOWLEDGE OF HYPERTENSION Description:   Patient will be able to manage HTN with medications and dietary modifications using handouts and educational resources with cues/reminders Outcome: Progressing Goal: RH STG INCREASE KNOWLEGDE OF HYPERLIPIDEMIA Description:   Patient will be able to manage HLD with medications and dietary modifications using handouts and educational resources with cues/reminders  Outcome: Progressing Goal: RH STG INCREASE KNOWLEDGE OF STROKE PROPHYLAXIS Description:   Patient will be able to manage secondary stroke risks with medications and dietary modifications using handouts and educational resources with cues/reminders Outcome: Progressing   

## 2021-05-24 LAB — GLUCOSE, CAPILLARY
Glucose-Capillary: 177 mg/dL — ABNORMAL HIGH (ref 70–99)
Glucose-Capillary: 133 mg/dL — ABNORMAL HIGH (ref 70–99)
Glucose-Capillary: 110 mg/dL — ABNORMAL HIGH (ref 70–99)
Glucose-Capillary: 168 mg/dL — ABNORMAL HIGH (ref 70–99)

## 2021-05-24 LAB — CBC
HCT: 37.6 % — ABNORMAL LOW (ref 39.0–52.0)
Hemoglobin: 12.2 g/dL — ABNORMAL LOW (ref 13.0–17.0)
MCH: 27.4 pg (ref 26.0–34.0)
MCHC: 32.4 g/dL (ref 30.0–36.0)
MCV: 84.5 fL (ref 80.0–100.0)
Platelets: 335 10*3/uL (ref 150–400)
RBC: 4.45 MIL/uL (ref 4.22–5.81)
RDW: 13.6 % (ref 11.5–15.5)
WBC: 7.9 10*3/uL (ref 4.0–10.5)
nRBC: 0 % (ref 0.0–0.2)

## 2021-05-24 MED ORDER — GLIPIZIDE 5 MG PO TABS
5.0000 mg | ORAL_TABLET | Freq: Every day | ORAL | Status: DC
  Administered 2021-05-24 – 2021-05-26 (×3): 5 mg via ORAL
  Filled 2021-05-24 (×2): qty 1

## 2021-05-24 NOTE — Discharge Summary (Signed)
Physician Discharge Summary  Patient ID: Peter Lucas MRN: [id number] DOB/AGE: 56-Apr-1967 56 y.o.  Admit date: 05/12/2021 Discharge date: 05/26/2021  Discharge Diagnoses:  Principal Problem:   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 (HCC) DVT prophylaxis Acute respiratory failure with hypoxia and hypercapnia Hypertension Hyperlipidemia Obesity Tobacco abuse Prerenal azotemia New findings diabetes mellitus  Discharged Condition: Stable  Significant Diagnostic Studies: CT HEAD WO CONTRAST  Result Date: 05/08/2021 CLINICAL DATA:  Follow-up left MCA branch vessel occlusion EXAM: CT HEAD WITHOUT CONTRAST TECHNIQUE: Contiguous axial images were obtained from the base of the skull through the vertex without intravenous contrast. COMPARISON:  Multiple examinations yesterday. FINDINGS: Brain: More pronounced low density present in the left posterior temporal lobe, temporoparietal junction and parietal low consistent with the known acute infarction. Maximal dimension of this region is about 8.5 cm. Swelling but no significant mass effect or midline shift. Resolution of adjacent hyperdensity which was probably contrast staining from the previous intervention. No sign of hemorrhage. No new infarction. Mild chronic small-vessel ischemic change elsewhere affecting the hemispheric white matter Vascular: There is atherosclerotic calcification of the major vessels at the base of the brain. Skull: Negative Sinuses/Orbits: Scattered sinus opacification, most pronounced within the left maxillary sinus. Orbits negative. Other: None IMPRESSION: More pronounced low-density within the left MCA branch vessel infarction as described above. No midline shift or hemorrhage. Diminishing density of the contrast staining adjacent to the infarction. Electronically Signed   By: Paulina Fusi M.D.   On: 05/08/2021 13:45   CT HEAD WO CONTRAST  Result Date: 05/07/2021 CLINICAL DATA:  Left MCA peripheral occlusion  status post percutaneous thrombectomy. EXAM: CT HEAD WITHOUT CONTRAST TECHNIQUE: Contiguous axial images were obtained from the base of the skull through the vertex without intravenous contrast. COMPARISON:  Intraoperative cone beam CT 05/07/2021 at 2:42 p.m., CT head 11:15 a.m. FINDINGS: Brain: There is again identified high attenuation material intravascularly within the distal left MCA branches in the region of the sylvian fissure posteriorly. There is petechial hemorrhage involving the temporal operculum in keeping with a a small cortical infarct in this region, best appreciated on axial image # 17/6. Trace subarachnoid hemorrhage is noted within the sylvian fissure at this level as well as within the sulci of the left para falcine parietal lobe. This appears stable when compared to prior cone beam CT arteriogram. There is no significant associated mass effect. No midline shift. No foci of acute intracranial hemorrhage. Ventricular size is normal. Cerebellum is unremarkable. Vascular: No new asymmetric hyperdensity within the a intravascular spaces at the skull base. Skull: Intact Sinuses/Orbits: There is opacification of the left maxillary sinus, unchanged as well as mild mucosal thickening within the ethmoid air cells bilaterally. The orbits are unremarkable. Other: Mastoid air cells and middle ear cavities are clear. IMPRESSION: Stable hyperdensity within the terminal left MCA branches in the region of the posterior left sylvian fissure in keeping with intraluminal thrombus and/or contrast as well as petechial hemorrhage involving a small adjacent cortical infarct involving the left temporal operculum. Stable small subarachnoid hemorrhage in the region of the a cortical infarct as well as the left parietal lobe. No abnormal mass effect. Electronically Signed   By: [name]   On: 05/07/2021 20:44   MR BRAIN WO CONTRAST  Result Date: 05/09/2021 CLINICAL DATA:  Stroke follow-up. EXAM: MRI HEAD  WITHOUT CONTRAST TECHNIQUE: Multiplanar, multiecho pulse sequences of the brain and surrounding structures were obtained without intravenous contrast. COMPARISON:  CT head May 08, 2021. FINDINGS: Brain: Large, confluent acute posterior left MCA territory infarct. Additional smaller acute infarcts scattered throughout the left frontal and temporal lobes and left insular region. Associated edema and regional mass effect. No midline shift. Basal cisterns are patent. Additional mild scattered T2/FLAIR hyperintensities within the white matter, compatible with chronic microvascular ischemic disease. Inferior right basal ganglia dilated perivascular space. Areas of ill-defined susceptibility artifact and T1 hyperintensity along the posteromedial aspect of the confluent infarct, likely petechial hemorrhage. No hydrocephalus. Vascular: Major arterial flow voids of the skull base are maintained. Curvilinear susceptibility artifact within the posterior aspect of the left sylvian fissure, extending into the region of confluent infarct, most likely representing distal left MCA thrombus that was better characterized on prior vascular studies. Skull and upper cervical spine: Normal marrow signal. Sinuses/Orbits: Left greater than right maxillary sinus mucosal thickening with scattered ethmoid air cell opacification. Other: Moderate bilateral mastoid effusions. IMPRESSION: 1. Large, confluent acute posterior left MCA territory infarct with smaller acute infarcts scattered throughout the left frontotemporal lobes and left insular region. Associated edema and regional mass effect without midline shift. Areas of petechial hemorrhage along the posteromedial aspect of the confluent hemorrhage. 2. Curvilinear susceptibility artifact within the posterior aspect of the left sylvian fissure, extending into the region of confluent infarct, most likely representing distal left MCA thrombus that was better characterized on prior vascular  studies. 3. Paranasal sinus mucosal thickening and bilateral mastoid effusions. Electronically Signed   By: Feliberto Harts MD   On: 05/09/2021 17:27   IR CT Head Ltd  Result Date: 05/10/2021 INDICATION: New onset of dense aphasia and moderate right upper extremity weakness. Prominent M2 branch of the inferior division of the left MCA on the CT angiogram of the head and neck. EXAM: 1. EMERGENT LARGE VESSEL OCCLUSION THROMBOLYSIS (anterior CIRCULATION) COMPARISON:  CT angiogram of the head and neck of May 07, 2021. MEDICATIONS: Ancef 2 g IV antibiotic was administered within 1 hour of the procedure. ANESTHESIA/SEDATION: General anesthesia CONTRAST:  Isovue 300 approximately 100 mL. FLUOROSCOPY TIME:  Fluoroscopy Time: 43 minutes 6 seconds (2092 mGy). COMPLICATIONS: None immediate. TECHNIQUE: Following a full explanation of the procedure along with the potential associated complications, an informed witnessed consent was obtained. The risks of intracranial hemorrhage of 10%, worsening neurological deficit, ventilator dependency, death and inability to revascularize were all reviewed in detail with the patient's daughter. The patient was then put under general anesthesia by the Department of Anesthesiology at [facility]. The right groin was prepped and draped in the usual sterile fashion. Thereafter using modified Seldinger technique, transfemoral access into the right common femoral artery was obtained without difficulty. Over a 0.035 inch guidewire an 8 [place] Pinnacle 25 cm sheath was inserted. Through this, and also over a 0.035 inch guidewire a 5.5 Berenstein support catheter inside of an 087 balloon guide catheter was advanced to the aortic arch and selectively positioned in the left common carotid artery. The guidewire and support catheter were removed. Good aspiration was obtained from the hub of the balloon guide catheter. An angiogram was then performed centered extra cranially and  intracranially. FINDINGS: The left common carotid arteriogram demonstrates the left external carotid artery and its major branches to be widely patent. The left internal carotid artery at the bulb to the cranial skull base is widely patent with mild to moderate tortuosity at the distal cervical segment. More distally the petrous, the cavernous and the supraclinoid segments are widely patent. The left middle and its branches opacify  except for a distal inferior division branch in the distal M2 M3 region. A moderate sized area of hypoperfusion is seen involving the parietal cortical subcortical region. The left anterior cerebral artery opacifies into the capillary and venous phases. PROCEDURE: Over a 0.014 inch standard Synchro micro guidewire a combination of a 35 136 cm Zoom catheter inside of a 55 132 cm Zoom aspiration catheter was advanced to the supraclinoid left ICA. The micro guidewire was then gently advanced using a torque device without difficulty to the occluded distal M2 M3 region of the inferior division of the left middle cerebral artery followed by the microcatheter. The micro guidewire was then gently manipulated with a torque device and teased gently. However, significant resistance was encountered to the advancement of the micro guidewire. The 35 Zoom catheter was then gently advanced to the occluded site. The micro guidewire was manipulated without success through the occluded segment. The 35 Zoom aspiration catheter was embedded in the occlusion. Constant aspiration was applied at the hub of the 35 Zoom aspiration catheter, and also in the Penumbra aspiration device at the hub of the 55 Zoom aspiration catheter which was advanced more distally. After 2-1/2 minutes, the combination of the 55 and the 35 Zoom aspiration catheter was retrieved until there was free flow of blood at the hub of 55 aspiration Zoom catheter. A control arteriogram performed through the 55 Zoom aspiration catheter  demonstrated no significant change in the occluded inferior divisions distal segment. A second pass was made again using the above combination. An 027 Marksman 160 cm microcatheter was advanced inside of the 55 Zoom aspiration catheter over a 0.14 inch Synchro micro guidewire as described above without difficulty. At this time, an 014 Aristotle guidewire was utilized. Significant resistance was again encountered to the advancement of the micro guidewire, and also the microcatheter which was imbedded in the occlusion. The micro guidewire was retrieved and removed. Again constant aspiration was applied at the hub of the 027 microcatheter, and also the Zoom aspiration catheter as described earlier with Penumbra aspiration device for approximately 2 minutes. The combination was then retrieved and removed. Control arteriogram performed through the 55 Zoom aspiration catheter demonstrated no change in the occlusion. A third pass was then made at this time using the 014 inch Aristotle micro guidewire with an 021 Trevo ProVue microcatheter. The micro guidewire was advanced to the occluded middle cerebral artery branch followed by the microcatheter. Gentle rotational and to and fro slow movements were performed with the microcatheter and micro guidewire with the micro guidewire now penetrating more distally into the distal M3 region followed by the microcatheter. The guidewire was removed. The microcatheter was retrieved more proximally until free aspiration of blood was noted just proximal to the occlusion. A gentle control arteriogram performed through the microcatheter now demonstrated a focal area of irregularity with severe stenosis. Distal to this the vessel remained occluded. A fourth pass was then made this time again using a 35 Zoom aspiration catheter with a 55 Zoom aspiration catheter advanced in combination again over a micro guidewire just proximal to the pre occlusive stenosis. The micro guidewire was gently  advanced past this followed by the 35 Zoom aspiration catheter into the mid M3 region. The guidewire was removed. Slow aspiration obtained from the hub of the 35 Zoom aspiration catheter. The 55 Zoom aspiration catheter was now advanced to just proximal to the occluded branch. Thereafter, constant aspiration was applied at the hub of both the catheters for approximately 2  minutes. Following removal, a control arteriogram performed through the balloon guide in the left internal carotid artery now demonstrated flow through the occluded portion of the vessel with a tight focal pre occlusive stenosis noted at this site. A TICI 2C revascularization was noted. Given the underlying severe stenosis probably related to intracranial arteriosclerosis, the option of rescue a stent was considered. However, CT of the brain at that time demonstrated a focal area of contrast in the subarachnoid space and possibly in the adjacent cortical region. In view of this, and also the CT perfusion scan demonstrating a core at this site, the potential risks of hemorrhage with the use of dual antiplatelets was felt to outweigh the potential benefits. The procedure was, therefore, stopped. A control arteriogram performed through the balloon guide in the left internal carotid artery demonstrated continued TICI 2C revascularization. No mass-effect or midline shift was noted. An 8 French Angio-Seal closure device was used for hemostasis at the right groin puncture site. Distal pulses remained present bilaterally unchanged. The patient was then extubated. Upon recovery, the patient was very combative and not being able to communicate due to the global aphasia. It was decided to reintubate the patient for better control of the patient's blood pressure for at least 24 hours. The patient was then transferred to the PACU and then neuro ICU. IMPRESSION: Status post endovascular revascularization of occluded branch of the inferior division of the left  middle cerebral artery, with contact aspiration x4 with a TICI 2C revascularization with reocclusion of the branch. PLAN: As per referring MD. Electronically Signed   By: Julieanne Cotton M.D.   On: 05/09/2021 10:47   CT CEREBRAL PERFUSION W CONTRAST  Result Date: 05/07/2021 CLINICAL DATA:  55 year old male code stroke presentation with posterior left MCA infarct visible on plain CT. EXAM: CT ANGIOGRAPHY HEAD AND NECK CT PERFUSION BRAIN TECHNIQUE: Multidetector CT imaging of the head and neck was performed using the standard protocol during bolus administration of intravenous contrast. Multiplanar CT image reconstructions and MIPs were obtained to evaluate the vascular anatomy. Carotid stenosis measurements (when applicable) are obtained utilizing NASCET criteria, using the distal internal carotid diameter as the denominator. Multiphase CT imaging of the brain was performed following IV bolus contrast injection. Subsequent parametric perfusion maps were calculated using RAPID software. CONTRAST:  OMNIPAQUE IOHEXOL 350 MG/ML SOLN COMPARISON:  Plain head CT 1116 hours. FINDINGS: CT Brain Perfusion Findings: ASPECTS: 7 CBF (<30%) Volume: 46mL Perfusion (Tmax>6.0s) volume: 67mL.  Hypoperfusion index 0.5 Mismatch Volume: 29mL, mismatch ratio 1.7 Infarction Location:Posterior left MCA CTA NECK Skeleton: Carious posterior dentition. Minimal cervical spine degeneration. No acute osseous abnormality identified. Upper chest: Negative. Other neck: Postinflammatory dystrophic calcifications of the palatine tonsils. Left maxillary sinus disease. Otherwise negative. Aortic arch: Mildly tortuous aortic arch. Three vessel arch configuration without arch atherosclerosis. Right carotid system: Mildly tortuous brachiocephalic artery. Negative right CCA and right carotid bifurcation. Tortuous right ICA distal to the bulb but no stenosis to the skull base. Left carotid system: Negative left CCA. Minimal calcified plaque at  the left carotid bifurcation. Tortuous left ICA below the skull base without stenosis. Vertebral arteries: Normal proximal right subclavian artery and right vertebral artery origin. The right vertebral is patent and normal to the skull base. Normal proximal left subclavian artery and left vertebral artery origin. The left vertebral is patent and normal to the skull base. CTA HEAD Posterior circulation: Codominant vertebral arteries. Patent V4 segments with no stenosis on the left, but there is a severe  stenosis at the right vertebrobasilar junction best seen on series 9, image 29. Normal PICA origins. Patent basilar artery with mild irregularity but no significant stenosis. Patent SCA and PCA origins. Posterior communicating arteries are diminutive or absent. Bilateral PCA irregularity in keeping with intracranial atherosclerosis. Moderate to severe stenosis of the right P1/P2 junction, and mild left PCA stenoses on series 8, image 17. Anterior circulation: Both ICA siphons are patent. On the left there is mild calcified plaque with no significant siphon stenosis. Normal left ophthalmic artery origin. Patent left ICA terminus. Mild right siphon calcified plaque without stenosis. Normal right ophthalmic artery origin and right ICA terminus. Patent MCA and ACA origins. Mild stenosis at the right A1 origin on series 9, image 24. Anterior communicating artery and ACA A2 segments are within normal limits. There is a moderate to severe distal left A2/pericallosal stenosis on series 10, image 23. Right MCA M1 and right MCA trifurcation are patent without stenosis. Right MCA branches are within normal limits. Left MCA M1 and left MCA trifurcation are patent without stenosis. There is occlusion of a posterior right MCA branch, distal M2 or proximal M3 seen on series 7, images 155-157. This is also on series 10, image 31. The other left MCA branches appear patent with mild irregularity. Venous sinuses: Early contrast timing,  not well evaluated. Anatomic variants: None. Review of the MIP images confirms the above findings Left MCA findings reviewed in person Dr. Erick Blinks on 05/07/2021 at 1135 hours, and discussed again by telephone at 11:45 . IMPRESSION: 1. Positive for occlusion of a posterior Left MCA branch, distal M2 versus proximal M3. Associated posterior left MCA territory infarct core of 42 mL and 70 mL estimated penumbra by CTP (mismatch of 28 mL). This was discussed with Dr. Erick Blinks on 05/07/2021 beginning at 1135 hours. 2. 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 - moderate to severe stenoses of the Right PCA P1/P2 junction. 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 Electronically Signed   By: Odessa Fleming M.D.   On: 05/07/2021 11:57   DG CHEST PORT 1 VIEW  Addendum Date: 05/09/2021   ADDENDUM REPORT: 05/09/2021 08:05 ADDENDUM: Sentence in the impression should read: Endotracheal tube as described without pneumothorax. Electronically Signed   By: Bretta Bang III M.D.   On: 05/09/2021 08:05   Result Date: 05/09/2021 CLINICAL DATA:  Hypoxia EXAM: PORTABLE CHEST 1 VIEW COMPARISON:  May 07, 2021 FINDINGS: Endotracheal tube tip is 3.9 cm above the carina. No pneumothorax. There is cardiomegaly with pulmonary venous hypertension. There is ill-defined airspace opacity in each lower lung region. No adenopathy. No bone lesions. IMPRESSION: Endotracheal tube as described pneumothorax. Cardiomegaly with pulmonary vascular congestion. Airspace opacity in each lower lung region may represent bilateral pneumonia. A degree of pulmonary edema may present in this manner. Both edema and pneumonia may be present concurrently. Electronically Signed: By: Bretta Bang III M.D. On: 05/08/2021 14:01   DG CHEST PORT 1 VIEW  Result Date: 05/09/2021 CLINICAL DATA:  Hypoxia EXAM:  PORTABLE CHEST 1 VIEW COMPARISON:  May 08, 2021 FINDINGS: Endotracheal tube tip is 5.5 cm above the carina. No pneumothorax. There is persistent cardiomegaly with pulmonary venous hypertension. There is ill-defined opacity in each lower lung region, stable. No new opacity. No adenopathy. No bone lesions. IMPRESSION: Endotracheal tube as described without pneumothorax. Ill-defined opacity in each lung base may represent bilateral pneumonia  is stable. There may be pulmonary edema as well. Appearance stable. Stable cardiomegaly with a degree of cardiomegaly with pulmonary vascular congestion. Electronically Signed   By: Bretta Bang III M.D.   On: 05/09/2021 08:04   Portable Chest x-ray  Result Date: 05/07/2021 CLINICAL DATA:  Code stroke.  Endotracheal tube. EXAM: PORTABLE CHEST 1 VIEW COMPARISON:  None. FINDINGS: Endotracheal tube appears well positioned with tip approximately 3 cm above the carina. Cardiomegaly. Lungs are clear. No pleural effusion or pneumothorax is seen. Osseous structures about the chest are unremarkable. IMPRESSION: 1. Endotracheal tube well positioned with tip approximately 3 cm above the carina. 2. Cardiomegaly. 3. Lungs are clear. Electronically Signed   By: Bary Richard M.D.   On: 05/07/2021 15:57   DG Swallowing Func-Speech Pathology  Result Date: 05/12/2021 Formatting of this result is different from the original. Objective Swallowing Evaluation: Type of Study: MBS-Modified Barium Swallow Study  Patient Details Name: Peter Lucas MRN: [id number] Date of Birth: 1966/10/01 Today's Date: 05/12/2021 Time: SLP Start Time (ACUTE ONLY): 0801 -SLP Stop Time (ACUTE ONLY): 0818 SLP Time Calculation (min) (ACUTE ONLY): 17 min Past Medical History: No past medical history on file. Past Surgical History: Past Surgical History: Procedure Laterality Date  BACK SURGERY    cyst removal  IR CT HEAD LTD  05/07/2021  IR PERCUTANEOUS ART THROMBECTOMY/INFUSION INTRACRANIAL INC DIAG ANGIO  05/07/2021   RADIOLOGY WITH ANESTHESIA N/A 05/07/2021  Procedure: IR WITH ANESTHESIA;  Surgeon: Radiologist, Medication, MD;  Location: [facility] OR;  Service: Radiology;  Laterality: N/A; HPI: Pt is a 55 y/o male who presented on 6/4 with aphasia. 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 ETT 6/4-6/5; reintubated 6/5-6/6 worsening respiratory distress due to flash pulmonary edema. PMH: heavy tobacco use.  Subjective: pt awake in swallow function chair Assessment / Plan / Recommendation CHL IP CLINICAL IMPRESSIONS 05/12/2021 Clinical Impression Patient presents with normal oropharyngeal swallow ability.  He self fed with all intake except pudding and cracker.  Mastication, oral transiting and pharyngeal motility WFL.  Recommend regular/thin diet - medication as tolerated.  No SLP follow up regarding swallowing. SLP Visit Diagnosis Dysphagia, unspecified (R13.10) Attention and concentration deficit following -- Frontal lobe and executive function deficit following -- Impact on safety and function Mild aspiration risk   CHL IP TREATMENT RECOMMENDATION 05/09/2021 Treatment Recommendations Therapy as outlined in treatment plan below   Prognosis 05/12/2021 Prognosis for Safe Diet Advancement Good Barriers to Reach Goals Language deficits;Other (Comment) Barriers/Prognosis Comment -- CHL IP DIET RECOMMENDATION 05/12/2021 SLP Diet Recommendations Regular solids;Thin liquid Liquid Administration via Cup;Straw Medication Administration Other (Comment) Compensations Slow rate;Small sips/bites;Minimize environmental distractions Postural Changes --   CHL IP OTHER RECOMMENDATIONS 05/12/2021 Recommended Consults -- Oral Care Recommendations Oral care BID Other Recommendations --   CHL IP FOLLOW UP RECOMMENDATIONS 05/12/2021 Follow up Recommendations Inpatient Rehab   CHL IP FREQUENCY AND DURATION 05/10/2021 Speech Therapy  Frequency (ACUTE ONLY) min 2x/week Treatment Duration --      CHL IP ORAL PHASE 05/12/2021 Oral Phase WFL Oral - Pudding Teaspoon -- Oral - Pudding Cup -- Oral - Honey Teaspoon -- Oral - Honey Cup -- Oral - Nectar Teaspoon NT Oral - Nectar Cup WFL Oral - Nectar Straw WFL Oral - Thin Teaspoon WFL Oral - Thin Cup WFL Oral - Thin Straw WFL Oral - Puree WFL Oral - Mech Soft WFL Oral - Regular -- Oral -  Multi-Consistency -- Oral - Pill -- Oral Phase - Comment --  CHL IP PHARYNGEAL PHASE 05/12/2021 Pharyngeal Phase WFL Pharyngeal- Pudding Teaspoon -- Pharyngeal -- Pharyngeal- Pudding Cup -- Pharyngeal -- Pharyngeal- Honey Teaspoon -- Pharyngeal -- Pharyngeal- Honey Cup -- Pharyngeal -- Pharyngeal- Nectar Teaspoon -- Pharyngeal -- Pharyngeal- Nectar Cup WFL Pharyngeal -- Pharyngeal- Nectar Straw WFL Pharyngeal -- Pharyngeal- Thin Teaspoon WFL Pharyngeal -- Pharyngeal- Thin Cup WFL Pharyngeal -- Pharyngeal- Thin Straw WFL Pharyngeal -- Pharyngeal- Puree WFL Pharyngeal -- Pharyngeal- Mechanical Soft WFL Pharyngeal -- Pharyngeal- Regular -- Pharyngeal -- Pharyngeal- Multi-consistency -- Pharyngeal -- Pharyngeal- Pill -- Pharyngeal -- Pharyngeal Comment --  CHL IP CERVICAL ESOPHAGEAL PHASE 05/12/2021 Cervical Esophageal Phase WFL Pudding Teaspoon -- Pudding Cup -- Honey Teaspoon -- Honey Cup -- Nectar Teaspoon -- Nectar Cup -- Nectar Straw -- Thin Teaspoon -- Thin Cup -- Thin Straw -- Puree -- Mechanical Soft -- Regular -- Multi-consistency -- Pill -- Cervical Esophageal Comment -- Rolena Infante, MS [name] SLP Acute Rehab Services Office [phone] Pager [phone] Chales Abrahams 05/12/2021, 8:45 AM              ECHOCARDIOGRAM COMPLETE BUBBLE STUDY  Result Date: 05/08/2021    ECHOCARDIOGRAM REPORT   Patient Name:   Peter Lucas Date of Exam: 05/08/2021 Medical Rec #:  [id number]      Height:       75.0 in Accession #:    [id number]     Weight:       274.3 lb Date of Birth:  [birth year]      BSA:          2.510 m Patient Age:    55  years       BP:           104/61 mmHg Patient Gender: M              HR:           65 bpm. Exam Location:  Inpatient Procedure: 2D Echo and Saline Contrast Bubble Study Indications:    stroke  History:        Patient has no prior history of Echocardiogram examinations.  Sonographer:    Delcie Roch Referring Phys: [id number] Gi Or Norman KHALIQDINA IMPRESSIONS  1. Left ventricular ejection fraction, by estimation, is 55 to 60%. The left ventricle has normal function. The left ventricle has no regional wall motion abnormalities. There is moderate left ventricular hypertrophy. Left ventricular diastolic parameters are indeterminate.  2. Right ventricular systolic function is normal. The right ventricular size is normal. Tricuspid regurgitation signal is inadequate for assessing PA pressure.  3. The mitral valve is grossly normal. Trivial mitral valve regurgitation.  4. The aortic valve is tricuspid. Aortic valve regurgitation is not visualized.  5. The inferior vena cava is normal in size with greater than 50% respiratory variability, suggesting right atrial pressure of 3 mmHg.  6. No visualization of agitated saline bubbles in right heart with reported injection from IV access both upper extremities, possibly inadequate agitation prior to injection. Consider repeat study if clinically indicated. FINDINGS  Left Ventricle: Left ventricular ejection fraction, by estimation, is 55 to 60%. The left ventricle has normal function. The left ventricle has no regional wall motion abnormalities. The left ventricular internal cavity size was normal in size. There is  moderate left ventricular hypertrophy. Left ventricular diastolic parameters are indeterminate. Right Ventricle: The right ventricular size is normal. No increase in right ventricular wall thickness. Right ventricular systolic function is normal.  Tricuspid regurgitation signal is inadequate for assessing PA pressure. Left Atrium: Left atrial size was normal in size.  Right Atrium: Right atrial size was normal in size. Pericardium: There is no evidence of pericardial effusion. Mitral Valve: The mitral valve is grossly normal. Trivial mitral valve regurgitation. Tricuspid Valve: The tricuspid valve is grossly normal. Tricuspid valve regurgitation is trivial. Aortic Valve: The aortic valve is tricuspid. There is mild aortic valve annular calcification. Aortic valve regurgitation is not visualized. Pulmonic Valve: The pulmonic valve was grossly normal. Pulmonic valve regurgitation is trivial. Aorta: The aortic root is normal in size and structure. Venous: The inferior vena cava is normal in size with greater than 50% respiratory variability, suggesting right atrial pressure of 3 mmHg. IAS/Shunts: No atrial level shunt detected by color flow Doppler. Agitated saline contrast was given intravenously to evaluate for intracardiac shunting.  LEFT VENTRICLE PLAX 2D LVIDd:         5.70 cm  Diastology LVIDs:         3.50 cm  LV e' medial:    4.90 cm/s LV PW:         1.20 cm  LV E/e' medial:  12.1 LV IVS:        1.40 cm  LV e' lateral:   5.77 cm/s LVOT diam:     2.20 cm  LV E/e' lateral: 10.2 LV SV:         73 LV SV Index:   29 LVOT Area:     3.80 cm  RIGHT VENTRICLE             IVC RV S prime:     16.80 cm/s  IVC diam: 1.80 cm TAPSE (M-mode): 1.8 cm LEFT ATRIUM             Index       RIGHT ATRIUM           Index LA diam:        4.50 cm 1.79 cm/m  RA Area:     20.60 cm LA Vol (A2C):   68.7 ml 27.38 ml/m RA Volume:   59.60 ml  23.75 ml/m LA Vol (A4C):   48.7 ml 19.41 ml/m LA Biplane Vol: 59.9 ml 23.87 ml/m  AORTIC VALVE LVOT Vmax:   111.00 cm/s LVOT Vmean:  66.000 cm/s LVOT VTI:    0.192 m  AORTA Ao Root diam: 3.70 cm Ao Asc diam:  3.60 cm MITRAL VALVE MV Area (PHT): 2.95 cm    SHUNTS MV Decel Time: 257 msec    Systemic VTI:  0.19 m MV E velocity: 59.10 cm/s  Systemic Diam: 2.20 cm MV A velocity: 76.70 cm/s MV E/A ratio:  0.77 Nona DellSamuel Mcdowell MD Electronically signed by Nona DellSamuel  Mcdowell MD Signature Date/Time: 05/08/2021/5:08:48 PM    Final    IR PERCUTANEOUS ART THROMBECTOMY/INFUSION INTRACRANIAL INC DIAG ANGIO  Result Date: 05/10/2021 INDICATION: New onset of dense aphasia and moderate right upper extremity weakness. Prominent M2 branch of the inferior division of the left MCA on the CT angiogram of the head and neck. EXAM: 1. EMERGENT LARGE VESSEL OCCLUSION THROMBOLYSIS (anterior CIRCULATION) COMPARISON:  CT angiogram of the head and neck of May 07, 2021. MEDICATIONS: Ancef 2 g IV antibiotic was administered within 1 hour of the procedure. ANESTHESIA/SEDATION: General anesthesia CONTRAST:  Isovue 300 approximately 100 mL. FLUOROSCOPY TIME:  Fluoroscopy Time: 43 minutes 6 seconds (2092 mGy). COMPLICATIONS: None immediate. TECHNIQUE: Following a full explanation of the procedure along with the potential associated  complications, an informed witnessed consent was obtained. The risks of intracranial hemorrhage of 10%, worsening neurological deficit, ventilator dependency, death and inability to revascularize were all reviewed in detail with the patient's daughter. The patient was then put under general anesthesia by the Department of Anesthesiology at [facility]. The right groin was prepped and draped in the usual sterile fashion. Thereafter using modified Seldinger technique, transfemoral access into the right common femoral artery was obtained without difficulty. Over a 0.035 inch guidewire an 8 [place] Pinnacle 25 cm sheath was inserted. Through this, and also over a 0.035 inch guidewire a 5.5 Berenstein support catheter inside of an 087 balloon guide catheter was advanced to the aortic arch and selectively positioned in the left common carotid artery. The guidewire and support catheter were removed. Good aspiration was obtained from the hub of the balloon guide catheter. An angiogram was then performed centered extra cranially and intracranially. FINDINGS: The left common  carotid arteriogram demonstrates the left external carotid artery and its major branches to be widely patent. The left internal carotid artery at the bulb to the cranial skull base is widely patent with mild to moderate tortuosity at the distal cervical segment. More distally the petrous, the cavernous and the supraclinoid segments are widely patent. The left middle and its branches opacify except for a distal inferior division branch in the distal M2 M3 region. A moderate sized area of hypoperfusion is seen involving the parietal cortical subcortical region. The left anterior cerebral artery opacifies into the capillary and venous phases. PROCEDURE: Over a 0.014 inch standard Synchro micro guidewire a combination of a 35 136 cm Zoom catheter inside of a 55 132 cm Zoom aspiration catheter was advanced to the supraclinoid left ICA. The micro guidewire was then gently advanced using a torque device without difficulty to the occluded distal M2 M3 region of the inferior division of the left middle cerebral artery followed by the microcatheter. The micro guidewire was then gently manipulated with a torque device and teased gently. However, significant resistance was encountered to the advancement of the micro guidewire. The 35 Zoom catheter was then gently advanced to the occluded site. The micro guidewire was manipulated without success through the occluded segment. The 35 Zoom aspiration catheter was embedded in the occlusion. Constant aspiration was applied at the hub of the 35 Zoom aspiration catheter, and also in the Penumbra aspiration device at the hub of the 55 Zoom aspiration catheter which was advanced more distally. After 2-1/2 minutes, the combination of the 55 and the 35 Zoom aspiration catheter was retrieved until there was free flow of blood at the hub of 55 aspiration Zoom catheter. A control arteriogram performed through the 55 Zoom aspiration catheter demonstrated no significant change in the occluded  inferior divisions distal segment. A second pass was made again using the above combination. An 027 Marksman 160 cm microcatheter was advanced inside of the 55 Zoom aspiration catheter over a 0.14 inch Synchro micro guidewire as described above without difficulty. At this time, an 014 Aristotle guidewire was utilized. Significant resistance was again encountered to the advancement of the micro guidewire, and also the microcatheter which was imbedded in the occlusion. The micro guidewire was retrieved and removed. Again constant aspiration was applied at the hub of the 027 microcatheter, and also the Zoom aspiration catheter as described earlier with Penumbra aspiration device for approximately 2 minutes. The combination was then retrieved and removed. Control arteriogram performed through the 55 Zoom aspiration catheter demonstrated no  change in the occlusion. A third pass was then made at this time using the 014 inch Aristotle micro guidewire with an 021 Trevo ProVue microcatheter. The micro guidewire was advanced to the occluded middle cerebral artery branch followed by the microcatheter. Gentle rotational and to and fro slow movements were performed with the microcatheter and micro guidewire with the micro guidewire now penetrating more distally into the distal M3 region followed by the microcatheter. The guidewire was removed. The microcatheter was retrieved more proximally until free aspiration of blood was noted just proximal to the occlusion. A gentle control arteriogram performed through the microcatheter now demonstrated a focal area of irregularity with severe stenosis. Distal to this the vessel remained occluded. A fourth pass was then made this time again using a 35 Zoom aspiration catheter with a 55 Zoom aspiration catheter advanced in combination again over a micro guidewire just proximal to the pre occlusive stenosis. The micro guidewire was gently advanced past this followed by the 35 Zoom aspiration  catheter into the mid M3 region. The guidewire was removed. Slow aspiration obtained from the hub of the 35 Zoom aspiration catheter. The 55 Zoom aspiration catheter was now advanced to just proximal to the occluded branch. Thereafter, constant aspiration was applied at the hub of both the catheters for approximately 2 minutes. Following removal, a control arteriogram performed through the balloon guide in the left internal carotid artery now demonstrated flow through the occluded portion of the vessel with a tight focal pre occlusive stenosis noted at this site. A TICI 2C revascularization was noted. Given the underlying severe stenosis probably related to intracranial arteriosclerosis, the option of rescue a stent was considered. However, CT of the brain at that time demonstrated a focal area of contrast in the subarachnoid space and possibly in the adjacent cortical region. In view of this, and also the CT perfusion scan demonstrating a core at this site, the potential risks of hemorrhage with the use of dual antiplatelets was felt to outweigh the potential benefits. The procedure was, therefore, stopped. A control arteriogram performed through the balloon guide in the left internal carotid artery demonstrated continued TICI 2C revascularization. No mass-effect or midline shift was noted. An 8 French Angio-Seal closure device was used for hemostasis at the right groin puncture site. Distal pulses remained present bilaterally unchanged. The patient was then extubated. Upon recovery, the patient was very combative and not being able to communicate due to the global aphasia. It was decided to reintubate the patient for better control of the patient's blood pressure for at least 24 hours. The patient was then transferred to the PACU and then neuro ICU. IMPRESSION: Status post endovascular revascularization of occluded branch of the inferior division of the left middle cerebral artery, with contact aspiration x4 with  a TICI 2C revascularization with reocclusion of the branch. PLAN: As per referring MD. Electronically Signed   By: Julieanne Cotton M.D.   On: 05/09/2021 10:47   CT HEAD CODE STROKE WO CONTRAST  Result Date: 05/07/2021 CLINICAL DATA:  Code stroke.  55 year old male neurologic deficit. EXAM: CT HEAD WITHOUT CONTRAST TECHNIQUE: Contiguous axial images were obtained from the base of the skull through the vertex without intravenous contrast. COMPARISON:  None. FINDINGS: Brain: Confluent cytotoxic edema in the posterior left MCA territory affecting the posterosuperior temporal gyrus through the left parietal lobe. No hemorrhagic transformation. No significant mass effect. The insula, internal capsule and deep gray matter nuclei appear spared. Gray-white matter differentiation elsewhere within normal limits.  No ventriculomegaly. Normal basilar cisterns. Vascular: Calcified atherosclerosis at the skull base. Suspicious hyperdensity in a posterior left MCA branch along the anterior margin of the cytotoxic edema series 2, image 17 and series 6, image 45. But no more proximal suspicious vessel hyperdensity is identified. Skull: Negative. Sinuses/Orbits: Subtotal opacification of the left maxillary sinus due to a mucosal thickening and/or retention cyst. Other Visualized paranasal sinuses and mastoids are clear. Other: Visualized orbits and scalp soft tissues are within normal limits. ASPECTS [facility] Stroke Program Early CT Score) - Ganglionic level infarction (caudate, lentiform nuclei, internal capsule, insula, M1-M3 cortex): 6 (abnormal M3) - Supraganglionic infarction (M4-M6 cortex): 1 Total score (0-10 with 10 being normal): 7 IMPRESSION: 1. 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 ASPECTS 7. 2. No hemorrhagic transformation or mass effect. Suspicion of a posterior left MCA branch occlusion at the posterior sylvian fissure. Study discussed by telephone with Dr. Erick Blinks on  05/07/2021 at 11:20 . Electronically Signed   By: Odessa Fleming M.D.   On: 05/07/2021 11:23   CT ANGIO HEAD CODE STROKE  Result Date: 05/07/2021 CLINICAL DATA:  55 year old male code stroke presentation with posterior left MCA infarct visible on plain CT. EXAM: CT ANGIOGRAPHY HEAD AND NECK CT PERFUSION BRAIN TECHNIQUE: Multidetector CT imaging of the head and neck was performed using the standard protocol during bolus administration of intravenous contrast. Multiplanar CT image reconstructions and MIPs were obtained to evaluate the vascular anatomy. Carotid stenosis measurements (when applicable) are obtained utilizing NASCET criteria, using the distal internal carotid diameter as the denominator. Multiphase CT imaging of the brain was performed following IV bolus contrast injection. Subsequent parametric perfusion maps were calculated using RAPID software. CONTRAST:  OMNIPAQUE IOHEXOL 350 MG/ML SOLN COMPARISON:  Plain head CT 1116 hours. FINDINGS: CT Brain Perfusion Findings: ASPECTS: 7 CBF (<30%) Volume: 42mL Perfusion (Tmax>6.0s) volume: 70mL.  Hypoperfusion index 0.5 Mismatch Volume: 28mL, mismatch ratio 1.7 Infarction Location:Posterior left MCA CTA NECK Skeleton: Carious posterior dentition. Minimal cervical spine degeneration. No acute osseous abnormality identified. Upper chest: Negative. Other neck: Postinflammatory dystrophic calcifications of the palatine tonsils. Left maxillary sinus disease. Otherwise negative. Aortic arch: Mildly tortuous aortic arch. Three vessel arch configuration without arch atherosclerosis. Right carotid system: Mildly tortuous brachiocephalic artery. Negative right CCA and right carotid bifurcation. Tortuous right ICA distal to the bulb but no stenosis to the skull base. Left carotid system: Negative left CCA. Minimal calcified plaque at the left carotid bifurcation. Tortuous left ICA below the skull base without stenosis. Vertebral arteries: Normal proximal right subclavian  artery and right vertebral artery origin. The right vertebral is patent and normal to the skull base. Normal proximal left subclavian artery and left vertebral artery origin. The left vertebral is patent and normal to the skull base. CTA HEAD Posterior circulation: Codominant vertebral arteries. Patent V4 segments with no stenosis on the left, but there is a severe stenosis at the right vertebrobasilar junction best seen on series 9, image 29. Normal PICA origins. Patent basilar artery with mild irregularity but no significant stenosis. Patent SCA and PCA origins. Posterior communicating arteries are diminutive or absent. Bilateral PCA irregularity in keeping with intracranial atherosclerosis. Moderate to severe stenosis of the right P1/P2 junction, and mild left PCA stenoses on series 8, image 17. Anterior circulation: Both ICA siphons are patent. On the left there is mild calcified plaque with no significant siphon stenosis. Normal left ophthalmic artery origin. Patent left ICA terminus. Mild right siphon calcified plaque without stenosis.  Normal right ophthalmic artery origin and right ICA terminus. Patent MCA and ACA origins. Mild stenosis at the right A1 origin on series 9, image 24. Anterior communicating artery and ACA A2 segments are within normal limits. There is a moderate to severe distal left A2/pericallosal stenosis on series 10, image 23. Right MCA M1 and right MCA trifurcation are patent without stenosis. Right MCA branches are within normal limits. Left MCA M1 and left MCA trifurcation are patent without stenosis. There is occlusion of a posterior right MCA branch, distal M2 or proximal M3 seen on series 7, images 155-157. This is also on series 10, image 31. The other left MCA branches appear patent with mild irregularity. Venous sinuses: Early contrast timing, not well evaluated. Anatomic variants: None. Review of the MIP images confirms the above findings Left MCA findings reviewed in person Dr.  Erick Blinks on 05/07/2021 at 1135 hours, and discussed again by telephone at 11:45 . IMPRESSION: 1. Positive for occlusion of a posterior Left MCA branch, distal M2 versus proximal M3. Associated posterior left MCA territory infarct core of 42 mL and 70 mL estimated penumbra by CTP (mismatch of 28 mL). This was discussed with Dr. Erick Blinks on 05/07/2021 beginning at 1135 hours. 2. 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 - moderate to severe stenoses of the Right PCA P1/P2 junction. 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 Electronically Signed   By: Odessa Fleming M.D.   On: 05/07/2021 11:57   CT ANGIO NECK CODE STROKE  Result Date: 05/07/2021 CLINICAL DATA:  56 year old male code stroke presentation with posterior left MCA infarct visible on plain CT. EXAM: CT ANGIOGRAPHY HEAD AND NECK CT PERFUSION BRAIN TECHNIQUE: Multidetector CT imaging of the head and neck was performed using the standard protocol during bolus administration of intravenous contrast. Multiplanar CT image reconstructions and MIPs were obtained to evaluate the vascular anatomy. Carotid stenosis measurements (when applicable) are obtained utilizing NASCET criteria, using the distal internal carotid diameter as the denominator. Multiphase CT imaging of the brain was performed following IV bolus contrast injection. Subsequent parametric perfusion maps were calculated using RAPID software. CONTRAST:  OMNIPAQUE IOHEXOL 350 MG/ML SOLN COMPARISON:  Plain head CT 1116 hours. FINDINGS: CT Brain Perfusion Findings: ASPECTS: 7 CBF (<30%) Volume: 42mL Perfusion (Tmax>6.0s) volume: 70mL.  Hypoperfusion index 0.5 Mismatch Volume: 28mL, mismatch ratio 1.7 Infarction Location:Posterior left MCA CTA NECK Skeleton: Carious posterior dentition. Minimal cervical spine degeneration. No acute osseous abnormality  identified. Upper chest: Negative. Other neck: Postinflammatory dystrophic calcifications of the palatine tonsils. Left maxillary sinus disease. Otherwise negative. Aortic arch: Mildly tortuous aortic arch. Three vessel arch configuration without arch atherosclerosis. Right carotid system: Mildly tortuous brachiocephalic artery. Negative right CCA and right carotid bifurcation. Tortuous right ICA distal to the bulb but no stenosis to the skull base. Left carotid system: Negative left CCA. Minimal calcified plaque at the left carotid bifurcation. Tortuous left ICA below the skull base without stenosis. Vertebral arteries: Normal proximal right subclavian artery and right vertebral artery origin. The right vertebral is patent and normal to the skull base. Normal proximal left subclavian artery and left vertebral artery origin. The left vertebral is patent and normal to the skull base. CTA HEAD Posterior circulation: Codominant vertebral arteries. Patent V4 segments with no stenosis on the left, but there is a severe stenosis at the right vertebrobasilar junction best seen on series 9, image  29. Normal PICA origins. Patent basilar artery with mild irregularity but no significant stenosis. Patent SCA and PCA origins. Posterior communicating arteries are diminutive or absent. Bilateral PCA irregularity in keeping with intracranial atherosclerosis. Moderate to severe stenosis of the right P1/P2 junction, and mild left PCA stenoses on series 8, image 17. Anterior circulation: Both ICA siphons are patent. On the left there is mild calcified plaque with no significant siphon stenosis. Normal left ophthalmic artery origin. Patent left ICA terminus. Mild right siphon calcified plaque without stenosis. Normal right ophthalmic artery origin and right ICA terminus. Patent MCA and ACA origins. Mild stenosis at the right A1 origin on series 9, image 24. Anterior communicating artery and ACA A2 segments are within normal limits.  There is a moderate to severe distal left A2/pericallosal stenosis on series 10, image 23. Right MCA M1 and right MCA trifurcation are patent without stenosis. Right MCA branches are within normal limits. Left MCA M1 and left MCA trifurcation are patent without stenosis. There is occlusion of a posterior right MCA branch, distal M2 or proximal M3 seen on series 7, images 155-157. This is also on series 10, image 31. The other left MCA branches appear patent with mild irregularity. Venous sinuses: Early contrast timing, not well evaluated. Anatomic variants: None. Review of the MIP images confirms the above findings Left MCA findings reviewed in person Dr. Erick Blinks on 05/07/2021 at 1135 hours, and discussed again by telephone at 11:45 . IMPRESSION: 1. Positive for occlusion of a posterior Left MCA branch, distal M2 versus proximal M3. Associated posterior left MCA territory infarct core of 42 mL and 70 mL estimated penumbra by CTP (mismatch of 28 mL). This was discussed with Dr. Erick Blinks on 05/07/2021 beginning at 1135 hours. 2. 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 - moderate to severe stenoses of the Right PCA P1/P2 junction. 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 Electronically Signed   By: Odessa Fleming M.D.   On: 05/07/2021 11:57    Labs:  Basic Metabolic Panel: Recent Labs  Lab 05/20/21 0508 05/23/21 0656  NA 133* 133*  K 4.5 4.5  CL 102 99  CO2 24 23  GLUCOSE 165* 165*  BUN 38* 27*  CREATININE 1.58* 1.51*  CALCIUM 9.2 9.4    CBC: Recent Labs  Lab 05/24/21 0501  WBC 7.9  HGB 12.2*  HCT 37.6*  MCV 84.5  PLT 335    CBG: Recent Labs  Lab 05/24/21 2056 05/25/21 0612 05/25/21 1121 05/25/21 1620 05/25/21 2104  GLUCAP 168* 169* 75 119* 163*   Family history.  Positive for hypertension and hyperlipidemia.  Denies any colon  cancer esophageal cancer or rectal cancer  Brief HPI:   Peter Lucas is a 55 y.o. right-handed male with unremarkable past medical history who has not seen a PCP for many years with history of tobacco abuse obesity BMI 34.28 on no prescription medications.  Per chart review patient lives alone 1 level home and independent prior to admission.  He has good family support.  Presented 05/07/2021 with acute onset of aphasia and right side weakness.  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  No hemorrhagic transformation.  Patient did not receive tPA.  CTA of the head and neck positive for occlusion of the posterior left MCA branch distal M2 versus proximal M3.  Patient underwent thrombectomy per  interventional radiology.  Latest cranial CT scan showed more pronounced low density within the left MCA branch vessel infarction.  No midline shift or hemorrhage.  Echocardiogram ejection fraction of 55 to 60% no wall motion abnormalities.  Admission chemistries unremarkable except glucose 185 alcohol negative WBC 11,100.  Patient did require initial intubation after thrombectomy was ultimately extubated but became hypoxic requiring reintubation final extubation 05/09/2021.  He did spike a low-grade fever respiratory cultures grew gram-positive cocci in pairs and chains maintained on Rocephin 05/11/2019 22 x 5 to 7 days.  Currently maintained on aspirin and Plavix for CVA prophylaxis.  Subcutaneous Lovenox for DVT prophylaxis.  Close monitoring of blood pressure initially on Cleviprex since discontinued.  Findings of elevated hemoglobin A1c 10.1 sliding scale insulin added.  Therapy evaluations completed due to patient's aphasia decreased functional mobility was admitted for a comprehensive rehab program.   Hospital Course: [facility] was admitted to rehab 05/12/2021 for inpatient therapies to consist of PT, ST and OT at least three hours five days a week. Past admission  physiatrist, therapy team and rehab RN have worked together to provide customized collaborative inpatient rehab.  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  Patient would follow-up neurology services.  He remained on aspirin Plavix therapy x90 days then aspirin alone beginning 08/08/2021.  Blood pressure controlled and monitored on Norvasc as well as clonidine with hydralazine and patient would need outpatient follow-up.  He did have a history of tobacco abuse maintained on NicoDerm patch receiving counseling in regards to cessation of nicotine products.  Subcutaneous Lovenox for DVT prophylaxis no bleeding episodes.  New findings of diabetes mellitus with hemoglobin A1c 10.1 follow-up outpatient and patient did receive diabetic teaching.  Patient with mild AKI with latest creatinine 1.51 his HCTZ had recently been discontinued.  Hyperlipidemia Lipitor ongoing.   Blood pressures were monitored on TID basis and controlled  Diabetes has been monitored with ac/hs CBG checks and SSI was use prn for tighter BS control.    Rehab course: During patient's stay in rehab weekly team conferences were held to monitor patient's progress, set goals and discuss barriers to discharge. At admission, patient required +2 physical assist stand pivot transfers moderate assist supine to sit minimal assist under 50 feet to person hand-held assistance.  +2 physical assist toilet transfers  Physical exam.  Blood pressure 149/93 pulse 63 temperature 96 respiration 19 oxygen saturation 96% room air Constitutional.  No acute distress HEENT.  Right facial droop Eyes.  Pupils round and reactive to light no discharge without nystagmus Neck.  Supple nontender no JVD without thyromegaly Cardiac regular rate rhythm without extra sounds or murmur heard Abdomen.  Soft nontender positive bowel sounds without rebound Respiratory  effort normal no respiratory distress without wheeze Musculoskeletal.  Normal range of motion Left upper and left lower extremity.  5/5 but cannot test some muscles due to aphasia Right upper extremity triceps 4/5 grip 3 -/5 finger abduction 0/5 Right lower extremity least 4/5 in hip flexors, dorsi flexion and plantar flexion could not understand knee extension and knee flexion Neurologic.  Alert globally aphasic follows inconsistent commands not able to name anything.  He/She  has had improvement in activity tolerance, balance, postural control as well as ability to compensate for deficits. He/She has had improvement in functional use RUE/LUE  and RLE/LLE as well as improvement in awareness patient performed STS and SPVT transfers without  assistive device.  Ambulates 120 feet x 2 without assist device contact-guard assist for safety.  Patient able to don shoes and tie with modified independent.  Walk to the bathroom completing toileting with supervision.  Walked out to wash his hands grooming standing at sink side supervision.  SLP facilitated object naming task using [employer] naming app on iPad.  Patient achieved 15% 3/19 objects independently 22% when given phonemic cue.  Full family teaching completed discussed the need for supervision for safety family teaching completed and discharged to home       Disposition: Discharged to home    Diet: Diabetic diet  Special Instructions: No driving smoking or alcohol  Continue aspirin 81 mg daily and Plavix 75 mg daily x90 days then aspirin alone beginning 08/08/2021  Patient will need scheduled follow-up with arranged PCP for ongoing monitoring of hypertension as well as diabetes.  Medications at discharge 1.  Tylenol as needed 2.  Norvasc 10 mg p.o. daily 3.  Aspirin 81 mg p.o. daily 4.  Lipitor 40 mg p.o. daily 5.  Clonidine 0.3 mg p.o. every 8 hours 6.  Plavix 75 mg p.o. daily 7.  Hydralazine 10 mg every 8 hours 8 MiraLAX daily hold  for loose stools 9 Glucotrol 5 mg daily 10.  NicoDerm patch.  Taper as directed   30-35 minutes were spent completing discharge summary and discharge planning  Discharge Instructions     Ambulatory referral to Neurology   Complete by: As directed    An appointment is requested in approximately: 4 weeks left parietal temporal infarction   Ambulatory referral to Physical Medicine Rehab   Complete by: As directed    Moderate complexity follow-up 1 to 2 weeks left parietal temporal infarction        Follow-up Information     Kirsteins, Victorino Sparrow, MD Follow up.   Specialty: Physical Medicine and Rehabilitation Why: office to call for appointment Contact information: [address] [place] [address] [phone]         Julieanne Cotton, MD Follow up.   Specialties: Interventional Radiology, Radiology Why: Call for appointment Contact information: [address] [place] [address] [phone]                 Signed: Charlton Amor 05/26/2021, 5:27 AM

## 2021-05-24 NOTE — Progress Notes (Signed)
Physical Therapy Session Note  Patient Details  Name: Peter Lucas MRN: [id number] Date of Birth: [birth year]  Today's Date: 05/24/2021 PT Individual Time: 1594-5859 PT Individual Time Calculation (min): 45 min   Short Term Goals: Week 1:  PT Short Term Goal 1 (Week 1): Pt will perform supine <> sit consistently with CGA. PT Short Term Goal 2 (Week 1): Pt will perform safe SPVT transfers with close supervision. PT Short Term Goal 3 (Week 1): Pt will ambulate safely for at least 150 ft using LRAD with good balance. Week 2:    Week 3:     Skilled Therapeutic Interventions/Progress Updates:    Pain:  Pt reports no pain.  Treatment to tolerance.  Rest breaks and repositioning as needed.  Pt initially supine and agreeable to treatment session w/focus on coordination of family ed, gait, am ADLs. Pt supine to sit mod I. Sit to stand w/supervision. Gait to BR and stood to urinate w/distant supervision Daughter arrived and frustrated w/difficulty coordination family ed.  Therapist agreed to coordinate/arrange. Pt stood at sink and washed hands and face w/distant supervision.  Gait >462ft x 2 including mult turns/stops/starts w/supervision.  Scavenger hunt for numbered cars placed primarily in environment to R to encourage visual scanning/attention to R.  Pt able to locate 15 cards and retrives requiring picking up from floor, reaching overhead, ascending/descending ramp, walking thru mulch pit, ascending curb all w/cga w/single episode of tripping w/R foot when transitioning from mulch to decking w/R foot, cga for recovery.  Uses handrail when ascending curb or reaching to floor. Repeated activity x 2.  At end of session, Pt left oob in recliner w/chair alarm set and needs in reach.    Therapy Documentation Precautions:  Precautions Precautions: Fall, Other (comment) Precaution Comments: globally aphasic, R hemi UE> LE, R inattention Restrictions Weight Bearing Restrictions:  No    Therapy/Group: Individual Therapy Rada Hay, PT   Shearon Balo 05/24/2021, 12:51 PM

## 2021-05-24 NOTE — Progress Notes (Signed)
Speech Language Pathology Daily Session Note  Patient Details  Name: OLUWATOMISIN DEMAN MRN: [id number] Date of Birth: 1965/12/20  Today's Date: 05/24/2021 SLP Individual Time: 1405-1450 SLP Individual Time Calculation (min): 45 min  Short Term Goals: Week 2: SLP Short Term Goal 1 (Week 2): STG=LTG (ELOS, discharge 6/23)  Skilled Therapeutic Interventions:Skilled ST services focused on cognition skills. SLP facilitated auditory comprehension, basic problem solving and error awareness in novel card tasks. Pt initially required total A to comprehend basic rules of "Blink", however following x1 initial demonstration of sorting cards by color, shape and number pt was able to sort with supervision A verbal cues for errors. Pt was then able to apply matching to basic card task "blink" in which pt only required supervision A verbal cues for problem solving and error awareness. Pt benefited from demonstration and written cues. Pt was left in room with call bell within reach and chair alarm set. SLP recommends to continue skilled services.     Pain Pain Assessment Pain Score: 0-No pain  Therapy/Group: Individual Therapy  Zakhi Dupre  [facility] 05/24/2021, 4:01 PM

## 2021-05-24 NOTE — Plan of Care (Signed)
  Problem: Consults Goal: RH STROKE PATIENT EDUCATION Description: See Patient Education module for education specifics  Outcome: Progressing   Problem: RH BOWEL ELIMINATION Goal: RH STG MANAGE BOWEL WITH ASSISTANCE Description: STG Manage Bowel with mod I Assistance. Outcome: Progressing Goal: RH STG MANAGE BOWEL W/MEDICATION W/ASSISTANCE Description: STG Manage Bowel with Medication with mod I  Assistance. Outcome: Progressing   Problem: RH BLADDER ELIMINATION Goal: RH STG MANAGE BLADDER WITH ASSISTANCE Description: STG Manage Bladder With min Assistance Outcome: Progressing   Problem: RH SAFETY Goal: RH STG ADHERE TO SAFETY PRECAUTIONS W/ASSISTANCE/DEVICE Description: STG Adhere to Safety Precautions With  cues/reminders Assistance/Device. Outcome: Progressing   Problem: RH PAIN MANAGEMENT Goal: RH STG PAIN MANAGED AT OR BELOW PT'S PAIN GOAL Description: At or below level 4 Outcome: Progressing   Problem: RH KNOWLEDGE DEFICIT Goal: RH STG INCREASE KNOWLEDGE OF DIABETES Description:   Patient will be able to manage DM with medications and dietary modifications using handouts and educational resources with cues/reminders Outcome: Progressing Goal: RH STG INCREASE KNOWLEDGE OF HYPERTENSION Description:   Patient will be able to manage HTN with medications and dietary modifications using handouts and educational resources with cues/reminders Outcome: Progressing Goal: RH STG INCREASE KNOWLEGDE OF HYPERLIPIDEMIA Description:   Patient will be able to manage HLD with medications and dietary modifications using handouts and educational resources with cues/reminders  Outcome: Progressing Goal: RH STG INCREASE KNOWLEDGE OF STROKE PROPHYLAXIS Description:   Patient will be able to manage secondary stroke risks with medications and dietary modifications using handouts and educational resources with cues/reminders Outcome: Progressing

## 2021-05-24 NOTE — Progress Notes (Signed)
Physical Therapy Session Note  Patient Details  Name: Peter Lucas MRN: [id number] Date of Birth: 11-Aug-1966  Today's Date: 05/24/2021 PT Individual Time: 0945-1100 PT Individual Time Calculation (min): 75 min   Short Term Goals: Week 1:  PT Short Term Goal 1 (Week 1): Pt will perform supine <> sit consistently with CGA. PT Short Term Goal 2 (Week 1): Pt will perform safe SPVT transfers with close supervision. PT Short Term Goal 3 (Week 1): Pt will ambulate safely for at least 150 ft using LRAD with good balance.  Skilled Therapeutic Interventions/Progress Updates:  Patient seated in recliner on entrance to room. Patient alert and agreeable to PT session. Patient denied pain throughout session. Pt taken outside via w/c max A for time for first half of therapy session.   Therapeutic Activity: Transfers: Patient performed STS transfers with Mod I requiring push from seat and reaching back to assist with controlling descent. SPVT transfers continue to require supervision d/t visual field deficits. Provided vc throughout session for scanning environment and safety awareness.  Gait Training:  Patient ambulated >200 feet over uneven pavement with no AD and supervision while looking for orange cones placed to pt's R side along path determined by therapist. Only one instance of walking very close to large object on R side but able to maneuver and find all cones. Minimal cues provided for scanning to R side.   Pt also ambulates throughout hallways and crowded gym holding kickball and able to maneuver all objects with no LOB or hitting any obstacles. Able to put kickball back into cart and ambulate back to room requiring directional cues for finding room.   Neuromuscular Re-ed: NMR facilitated during session with focus on dynamic standing balance, problem solving, executive function. Pt guided in kicking activity with kickball against wall outside and finding angle and speed required to receive and  return ball to therapist. Standing task with finding bean bags placed to R side and matching color to colored discs on table in front of pt. Pt is still not able to name colors but is now able to correctly match colors without any cues. Used green, yellow and red bags to indicate stoplight in attempt to have pt identify meanings. Pt indicates that yellow means "wait" and by end of cueing and education, correctly relates that green is "go". NMR performed for improvements in motor control and coordination, balance, sequencing, judgement, and self confidence/ efficacy in performing all aspects of mobility at highest level of independence.   Patient seated  in recliner at end of session with brakes locked, seat alarm set, and all needs within reach.     Therapy Documentation Precautions:  Precautions Precautions: Fall, Other (comment) Precaution Comments: globally aphasic, R hemi UE> LE, R inattention Restrictions Weight Bearing Restrictions: No   Therapy/Group: Individual Therapy  Loel Dubonnet PT, DPT 05/24/2021, 7:58 AM

## 2021-05-24 NOTE — Progress Notes (Signed)
PROGRESS NOTE   Subjective/Complaints:  Spoke with daughter about d/c , she would prefer HH rather than OP due to transportation issues Also pt did not have PCP    ROS: limited due to aphasia  Objective:   No results found. Recent Labs    05/24/21 0501  WBC 7.9  HGB 12.2*  HCT 37.6*  PLT 335    Recent Labs    05/23/21 0656  NA 133*  K 4.5  CL 99  CO2 23  GLUCOSE 165*  BUN 27*  CREATININE 1.51*  CALCIUM 9.4     Intake/Output Summary (Last 24 hours) at 05/24/2021 0815 Last data filed at 05/24/2021 0751 Gross per 24 hour  Intake 1320 ml  Output --  Net 1320 ml         Physical Exam: Vital Signs Blood pressure (!) 142/76, pulse (!) 47, temperature 98 F (36.7 C), temperature source Oral, resp. rate 17, height 6\' 3"  (1.905 m), weight 118.1 kg, SpO2 98 %.  General: No acute distress Mood and affect are appropriate Heart: Regular rate and rhythm no rubs murmurs or extra sounds Lungs: Clear to auscultation, breathing unlabored, no rales or wheezes Abdomen: Positive bowel sounds, soft nontender to palpation, nondistended Extremities: No clubbing, cyanosis, or edema Skin: No evidence of breakdown, no evidence of rash    Skin: No evidence of breakdown, no evidence of rash Neurologic: Cranial nerves II through XII intact, motor strength is 5/5 in left 4/5 RIght deltoid, bicep, tricep, grip, hip flexor, knee extensors, ankle dorsiflexor and plantar flexor ongoing receptive aphasia, "demonstrates comprehension and purposeful responses to verbal cueing. Good standing balance  RIght field cut  Musculoskeletal: Full range of motion in all 4 extremities. No joint swelling   Assessment/Plan: 1. Functional deficits which require 3+ hours per day of interdisciplinary therapy in a comprehensive inpatient rehab setting. Physiatrist is providing close team supervision and 24 hour management of active medical  problems listed below. Physiatrist and rehab team continue to assess barriers to discharge/monitor patient progress toward functional and medical goals  Care Tool:  Bathing    Body parts bathed by patient: Right arm, Chest, Abdomen, Front perineal area, Buttocks, Right upper leg, Face, Left lower leg, Right lower leg, Left upper leg, Left arm   Body parts bathed by helper: Left arm     Bathing assist Assist Level: Supervision/Verbal cueing     Upper Body Dressing/Undressing Upper body dressing   What is the patient wearing?: Pull over shirt    Upper body assist Assist Level: Supervision/Verbal cueing    Lower Body Dressing/Undressing Lower body dressing      What is the patient wearing?: Pants, Underwear/pull up     Lower body assist Assist for lower body dressing: Contact Guard/Touching assist (standing)     Toileting Toileting    Toileting assist Assist for toileting: Contact Guard/Touching assist     Transfers Chair/bed transfer  Transfers assist     Chair/bed transfer assist level: Supervision/Verbal cueing     Locomotion Ambulation   Ambulation assist      Assist level: Supervision/Verbal cueing Assistive device: No Device Max distance: 200'   Walk 10 feet activity  Assist     Assist level: Contact Guard/Touching assist Assistive device: No Device   Walk 50 feet activity   Assist    Assist level: Contact Guard/Touching assist Assistive device: No Device    Walk 150 feet activity   Assist    Assist level: Contact Guard/Touching assist Assistive device: No Device    Walk 10 feet on uneven surface  activity   Assist Walk 10 feet on uneven surfaces activity did not occur: Safety/medical concerns         Wheelchair     Assist Will patient use wheelchair at discharge?: No Type of Wheelchair: Manual Wheelchair activity did not occur: Safety/medical concerns         Wheelchair 50 feet with 2 turns  activity    Assist    Wheelchair 50 feet with 2 turns activity did not occur: Safety/medical concerns       Wheelchair 150 feet activity     Assist  Wheelchair 150 feet activity did not occur: Safety/medical concerns       Blood pressure (!) 142/76, pulse (!) 47, temperature 98 F (36.7 C), temperature source Oral, resp. rate 17, height 6\' 3"  (1.905 m), weight 118.1 kg, SpO2 98 %.   Medical Problem List and Plan: 1.  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  Status post thrombectomy.             -patient may shower             -ELOS/Goals: 2-3 weeks- supervision to min A-    -Continue CIR- PT, OT and SLP Discussed that physically pt doing well , anticipate long term SLP needs post D/C, no driving  2.  Antithrombotics: -DVT/anticoagulation: Lovenox             -antiplatelet therapy: Aspirin 81 mg daily and Plavix 75 mg daily x90 days then aspirin alone beginning 08/08/2021 3. Pain Management: Tylenol as needed 4. Mood: Ativan 0.5 mg every 4 hours as needed anxiety             -antipsychotic agents: N/A 5. Neuropsych: This patient is not capable of making decisions on his own behalf. 6. Skin/Wound Care: Routine skin checks 7. Fluids/Electrolytes/Nutrition: Routine in and outs with follow-up chemistries 8.  Dysphagia.  on regular diet now 9.  Acute respiratory failure with hypoxia and hypercapnia.  Gram-positive cocci pneumonia.  Ceftriaxone 05/10/2021 x 5 to 7 days.  Resolved 11.  Hypertension.  HCTZ 25 mg daily, clonidine 0.2 mg every 8 hours, Norvasc 10 mg daily, lisinopril 20 mg daily.  Monitor with increased mobility Vitals:   05/24/21 0005 05/24/21 0517  BP: (!) 158/90 (!) 142/76  Pulse:  (!) 47  Resp:  17  Temp:  98 F (36.7 C)  SpO2:  98%   6/20- BP labile  con't regimen for now, may need to increase hydralazine  12.   Hyperlipidemia.  Lipitor 13.  Obesity.  BMI 34.28.  Dietary follow-up 14.  Tobacco abuse.  NicoDerm patch.  Provide counseling 15.  Hypokalemia - recent K+ 4.0 16.  Pre renal azotemia- likely due to reduced fluid intake plus diuretic,  Now off HCTZ, also d/ced ACE     6/20- BUN 28 and Cr 1.51- somewhat better- push fluids.  17. Constipation  6/20- LBM at least 5 days ago- will order Sorbitol.   LOS: 12 days  A FACE TO FACE EVALUATION WAS PERFORMED  Erick Colace 05/24/2021, 8:15 AM

## 2021-05-24 NOTE — Progress Notes (Signed)
Occupational Therapy Session Note  Patient Details  Name: Peter Lucas MRN: [id number] Date of Birth: Sep 21, 1966  Today's Date: 05/24/2021 OT Individual Time: 3235-5732 OT Individual Time Calculation (min): 40 min    Short Term Goals: Week 1:  OT Short Term Goal 1 (Week 1): Pt will complete 3/3 toileting tasks with CGA on toilet with min cuing. OT Short Term Goal 1 - Progress (Week 1): Met OT Short Term Goal 2 (Week 1): Pt will bathe with CGA and min cuing to attend to R side of body. OT Short Term Goal 2 - Progress (Week 1): Met OT Short Term Goal 3 (Week 1): Pt will complete grooming standing sink side with CGA. OT Short Term Goal 3 - Progress (Week 1): Met OT Short Term Goal 4 (Week 1): Pt will complete LB dressing with CGA. OT Short Term Goal 4 - Progress (Week 1): Met OT Short Term Goal 5 (Week 1): Pt will accurately follow 1 step commands for increased safety awareness during ADLs. OT Short Term Goal 5 - Progress (Week 1): Progressing toward goal Week 2:  OT Short Term Goal 1 (Week 2): STGs=LTGs 2/2 ELOS   Skilled Therapeutic Interventions/Progress Updates:    Pt greeted at time of session sitting up in recliner agreeable to OT session and no pain throughout. Note expressive aphasia limiting throughout session. Ambulated recliner > wheelchair Supervision and wheelchair transport outside after checking with RN. Outside, focused on functional mobility maneuvering obstacles and avoiding objects on R side with Supervision, no LOB noted and pt walking approx 200 ft per trial no AD. Obstacle course 1 trial walking around to various benches and tables collecting cones, 2x5 cone pick ups with squatting to ground level with Supervision throughout. Wheelchair transport back inside, ambulated short distance to recliner and set up alarm on call bell in reach.   Therapy Documentation Precautions:  Precautions Precautions: Fall, Other (comment) Precaution Comments: globally aphasic, R hemi  UE> LE, R inattention Restrictions Weight Bearing Restrictions: No     Therapy/Group: Individual Therapy  Viona Gilmore 05/24/2021, 12:55 PM

## 2021-05-25 LAB — GLUCOSE, CAPILLARY
Glucose-Capillary: 169 mg/dL — ABNORMAL HIGH (ref 70–99)
Glucose-Capillary: 75 mg/dL (ref 70–99)
Glucose-Capillary: 119 mg/dL — ABNORMAL HIGH (ref 70–99)
Glucose-Capillary: 163 mg/dL — ABNORMAL HIGH (ref 70–99)

## 2021-05-25 MED ORDER — CLONIDINE HCL 0.3 MG PO TABS: 0.3000 mg | ORAL_TABLET | Freq: Three times a day (TID) | ORAL | 11 refills | Status: AC

## 2021-05-25 MED ORDER — ACETAMINOPHEN 325 MG PO TABS: 650.0000 mg | ORAL_TABLET | ORAL | Status: AC | PRN

## 2021-05-25 MED ORDER — ATORVASTATIN CALCIUM 40 MG PO TABS: 40.0000 mg | ORAL_TABLET | Freq: Every day | ORAL | 0 refills | Status: AC

## 2021-05-25 MED ORDER — AMLODIPINE BESYLATE 10 MG PO TABS: 10.0000 mg | ORAL_TABLET | Freq: Every day | ORAL | 0 refills | Status: AC

## 2021-05-25 MED ORDER — CLOPIDOGREL BISULFATE 75 MG PO TABS: 75.0000 mg | ORAL_TABLET | Freq: Every day | ORAL | 1 refills | Status: DC

## 2021-05-25 MED ORDER — POLYETHYLENE GLYCOL 3350 17 G PO PACK: 17.0000 g | PACK | Freq: Every day | ORAL | 0 refills | Status: DC

## 2021-05-25 MED ORDER — HYDRALAZINE HCL 10 MG PO TABS: 10.0000 mg | ORAL_TABLET | Freq: Three times a day (TID) | ORAL | 0 refills | Status: DC

## 2021-05-25 MED ORDER — NICOTINE 21 MG/24HR TD PT24: MEDICATED_PATCH | TRANSDERMAL | 0 refills | Status: DC

## 2021-05-25 MED ORDER — GLIPIZIDE 5 MG PO TABS: 5.0000 mg | ORAL_TABLET | Freq: Every day | ORAL | 0 refills | Status: AC

## 2021-05-25 NOTE — Progress Notes (Signed)
Speech Language Pathology Discharge Summary  Patient Details  Name: Peter Lucas MRN: [id number] Date of Birth: Jun 11, 1966  Today's Date: 05/25/2021 SLP Individual Time: 2536-6440 and 815-845 SLP Individual Time Calculation (min): 15 min and 30 min   Skilled Therapeutic Interventions:   #1 Skilled ST services focused on education. Pt's daughter and son were present for education. Family requested comprehension of language level to understand tranfers of POA. SLP provided education that once pt understands the request is able to demonstrate fairly intact problem solving, recall and error awareness in functional task, however to understand the request pt requires extra visual (demonstration, visual aid and/or written cues.) Pt is nearing this level and is able to respond to yes/no questions pertaining to immediate environment/biographical information with 70% accuracy given max A visual (pictures and written) cues. SLP educated family to provided as many visual cues as possible, including pictures/photos and support with written cues and given choices to increase comprehension. Pt was able to count in automatic langauge task up to 20 with initial cues in 10 intervals, pt was able to name first 4 months of the year in order with written cues and able to name the amounts of coins with mod A visual cues. SLP educated on need for 24 hour supervision A and continued ST services. All questions answered to satisfaction. Pt left in room with family. Recommend to continue ST services.  #2 Skilled ST services focused on speech skills. SLP facilitated naming of common objects pt was able to name 2 out 5 objects with mod A written name cues and produced parts of word in 3 out 5 opportunities. Pt continues to demonstrate appropriate greetings and occasional verbal error awareness. Pt was left in room with chair alarm set and call bell within reach.     Patient has met 5 of 5 long term goals.  Patient to discharge  at overall Mod level.  Reasons goals not met:     Clinical Impression/Discharge Summary:   Pt made great progress meeting 5 out 5 goals, discharging at mod A. Pt demonstrates fluent aphasia with very little but emerging awareness of errors. Pt speaks using jargon, neologisms and some phonetic paraphasia at the sentence/conversation level. Pt is able to demonstrate appropriate greetings, count 1-20 with min A demonstration cues and name 4 months of the year. Pt is able to name common objects with 40% with most success seen with written cues. Pt is able to respond to basic yes/no questions with 70% accuracy given mac visual/written cues. Pt is able to match objects, photos, black/white pictures to words and phrases in a field of 3. Pt is able to write first name and last name with mod A to correct letters and benefited from tracing letters following preservation. Pt's functional basic problem solving, recall and error awareness appear to be mostly intact, however due to language impairment further assessment is needs, along with continues concerns for safety awareness. SLP recommends 24 hour supervision and continued ST services.  Care Partner:  Caregiver Able to Provide Assistance: Yes  Type of Caregiver Assistance: Physical;Cognitive  Recommendation:  Outpatient SLP;Home Health SLP;24 hour supervision/assistance  Rationale for SLP Follow Up: Maximize functional communication;Maximize cognitive function and independence;Reduce caregiver burden   Equipment: N/A   Reasons for discharge: Discharged from hospital   Patient/Family Agrees with Progress Made and Goals Achieved: Yes    Talene Glastetter  [facility] 05/25/2021, 12:18 PM

## 2021-05-25 NOTE — Progress Notes (Signed)
Patient ID: Peter Lucas, male   DOB: [birth year], 55 y.o.   MRN: [id number]  Team Conference Report to Patient/Family  Team Conference discussion was reviewed with the patient and caregiver, including goals, any changes in plan of care and target discharge date.  Patient and caregiver express understanding and are in agreement.  The patient has a target discharge date of 05/26/21.   SW spoke with patient daughter Peter Lucas), provided conference updates. Addressed questions and concerns. Pt daughter would prefer HH vs. OP, explained potential barriers due to insurance and daughter understood. Pt daughter has preference for PCP office and will call SW back with the name of the office and physician she would like patient set up at. No other questions or concerns, sw will cont to follow up.  Andria Rhein 05/25/2021, 11:43 AM

## 2021-05-25 NOTE — Progress Notes (Signed)
Patient ID: Peter Lucas, male   DOB: August 17, 1966, 55 y.o.   MRN: [id number]  Pt accepted by [facility] with $150 co-pays per discipline. Daughter informed of co-pay would still like to move forward with [facility]. Orders sent.   [place], [place] [phone]

## 2021-05-25 NOTE — Progress Notes (Signed)
Occupational Therapy Discharge Summary  Patient Details  Name: Peter Lucas MRN: [id number] Date of Birth: [birth year]  Today's Date: 05/25/2021 OT Individual Time: 9233-0076 OT Individual Time Calculation (min): 38 min + 26 min   Patient has met 13 of 13 long term goals due to improved activity tolerance, improved balance, postural control, ability to compensate for deficits, functional use of  RIGHT upper extremity, improved awareness, and improved coordination.  Patient to discharge at overall Supervision level.  Patient's care partner is independent to provide the necessary physical and cognitive assistance at discharge. Pt cont to be primarily limited by severity of global aphasia. Rec 24/7 S upon DC to maximize safety + and HHOT to cont to address dynamic standing balance, RUE NMR, visual scanning, safety awareness, and functional cognition to maximize ADL/IADL performance + decrease caregiver burden.   Reasons goals not met: NA  Recommendation:  Patient will benefit from ongoing skilled OT services in outpatient setting to continue to advance functional skills in the area of BADL and Reduce care partner burden.  Equipment: No equipment provided  Reasons for discharge: treatment goals met and discharge from hospital  Patient/family agrees with progress made and goals achieved: Yes  OT Discharge Precautions/Restrictions  Precautions Precautions: Fall;Other (comment) Precaution Comments: globally aphasic, mild R hemi UE> LE, R inattention Restrictions Weight Bearing Restrictions: No   Pain Pain Assessment Pain Scale: Faces Pain Score: 0-No pain ADL ADL Eating: Set up Where Assessed-Eating: Chair Grooming: Supervision/safety Where Assessed-Grooming: Standing at sink Upper Body Bathing: Supervision/safety Where Assessed-Upper Body Bathing: Shower Lower Body Bathing: Supervision/safety Where Assessed-Lower Body Bathing: Shower Upper Body Dressing:  Supervision/safety Where Assessed-Upper Body Dressing: Edge of bed Lower Body Dressing: Supervision/safety Where Assessed-Lower Body Dressing: Edge of bed Toileting: Supervision/safety Where Assessed-Toileting: Glass blower/designer: Close supervision Toilet Transfer Method: Counselling psychologist: Energy manager: Not assessed Social research officer, government: Close supervision Social research officer, government Method: Ambulating Vision Baseline Vision/History: Wears glasses Patient Visual Report: No change from baseline Vision Assessment?: No apparent visual deficits (Son reports pt needed glasses for distant vision PTA) Eye Alignment: Within Functional Limits Ocular Range of Motion: Within Functional Limits (Difficulty fully assessing 2/2 global aphasia) Alignment/Gaze Preference: Gaze left Perception  Perception: Impaired Inattention/Neglect: Does not attend to right side of body;Does not attend to right visual field (improved from eval) Praxis Praxis: Impaired Praxis Impairment Details: Initiation;Ideation (When presented toothpaste + tooth brush, unable to apply toothpaste appropriately.) Cognition Overall Cognitive Status: Impaired/Different from baseline Arousal/Alertness: Awake/alert Orientation Level: Oriented to person Attention: Sustained Focused Attention: Appears intact Sustained Attention: Impaired Sustained Attention Impairment: Verbal basic;Functional basic Memory:  (Difficulty assessing 2/2 global aphasia) Awareness: Impaired Awareness Impairment: Emergent impairment Problem Solving: Impaired Problem Solving Impairment: Functional basic;Verbal basic Behaviors: Impulsive Safety/Judgment: Impaired Sensation Sensation Light Touch: Appears Intact Proprioception: Impaired by gross assessment (mild R hemi) Stereognosis:  (unable to fully assess 2/2 global aphasia) Coordination Gross Motor Movements are Fluid and Coordinated: Yes (grossly WFL for  ADL, mild R hemi) Fine Motor Movements are Fluid and Coordinated: No Coordination and Movement Description: mild R hemi RUE> RLE Finger Nose Finger Test: Mild dysmetria on R Motor  Motor Motor: Hemiplegia Motor - Discharge Observations: R side weakness UE>LE Mobility  Transfers Sit to Stand: Supervision/Verbal cueing Stand to Sit: Supervision/Verbal cueing  Trunk/Postural Assessment  Cervical Assessment Cervical Assessment: Exceptions to [facility] (forward head) Thoracic Assessment Thoracic Assessment: Exceptions to [facility] (rounded shoulders) Lumbar Assessment Lumbar Assessment: Exceptions to [facility] (posterior pelvic tilt) Postural Control  Postural Control: Within Functional Limits  Balance Balance Balance Assessed: Yes Static Sitting Balance Static Sitting - Balance Support: Feet supported Static Sitting - Level of Assistance: 5: Stand by assistance Static Sitting - Comment/# of Minutes: S Dynamic Sitting Balance Dynamic Sitting - Balance Support: Feet supported;During functional activity Dynamic Sitting - Level of Assistance: 5: Stand by assistance Dynamic Sitting Balance - Compensations: S Dynamic Sitting - Balance Activities: Reaching for objects;Reaching across midline Static Standing Balance Static Standing - Balance Support: During functional activity;No upper extremity supported Static Standing - Level of Assistance: 5: Stand by assistance Static Standing - Comment/# of Minutes: S Dynamic Standing Balance Dynamic Standing - Balance Support: During functional activity Dynamic Standing - Level of Assistance: 5: Stand by assistance Dynamic Standing - Comments: S Extremity/Trunk Assessment RUE Assessment RUE Assessment: Exceptions to [facility] Active Range of Motion (AROM) Comments: 3/4 to full shoulder flexion General Strength Comments: 4+/5 in shoulder flexion, WFL for ADL RUE Body System: Neuro Brunstrum levels for arm and hand: Arm;Hand Brunstrum level for arm: Stage V Relative  Independence from Synergy Brunstrum level for hand: Stage V Independence from basic synergies LUE Assessment LUE Assessment: Within Functional Limits  Session 1: Pt received seated in recliner with daughter and son present, no s/sx pain throughout session, agreeable to therapy. Session focus on family education re ADL performance, RUE HEP, R inattention, safety awareness, and DME recs. Daughter and son attentive throughout session and asking appropriate questions. Reviewed pt's current ADL performance of overall S with no AD. No rec DME at this time as pt already has built-in shower chair, but discussed possibility of installing grab bar if needed for additional security. Emphasized rec of 24/7 S with req cuing for sequencing of ADL steps + obstacles on pt's R side. Reviewed daily activities to improve RUE NMR + functional cognition including ADL + adapted leisure activities. Pt completed 1 walk-in shower transfer with close S, pt able to sit on shower chair with no additional cuing. Declining further ADL at this time as pt reports he had already completed this am. Family with no additional questions at  time.   Pt left seated in recliner with family present with chair alarm engaged, call bell in reach, and all immediate needs met.    Session 2: Pt received seated in recliner with son present throughout session, no s/sx of pain, agreeable to therapy. Session focus on functional cognition + activity tolerance + functional mobility + RUE NMR in prep for improved ADL performance. Amb to and from gym with close S and min Vcs to avoid R sided obstacles. Standing at [place], completed Bell Cancellation task in 5 min 46 secs, req max Vcs to locate final 3 items, but overall demonstrating improvement in visually scanning to R. Demonstrated disorganized search pattern, discussed with pt/son importance of organized search pattern to improved ADL/IADL performance. Finally, matched playing cards with 100% accuracy using  RUE in appropriate amount of time. Pt unable to complete this task in previous session a week ago, additionally, demonstrating improved error awareness and correction. Pt amb back to recliner with close S.    Pt left seated in recliner with chair alarm engaged, son present, call bell in reach, and all immediate needs met.     Volanda Napoleon MS, OTR/L    05/25/2021, 11:42 AM

## 2021-05-25 NOTE — Progress Notes (Signed)
PROGRESS NOTE   Subjective/Complaints:  Wernicke's aphasia persists Family training with PT ongoing  ROS: limited due to aphasia  Objective:   No results found. Recent Labs    05/24/21 0501  WBC 7.9  HGB 12.2*  HCT 37.6*  PLT 335     Recent Labs    05/23/21 0656  NA 133*  K 4.5  CL 99  CO2 23  GLUCOSE 165*  BUN 27*  CREATININE 1.51*  CALCIUM 9.4     Intake/Output Summary (Last 24 hours) at 05/25/2021 9774 Last data filed at 05/25/2021 0830 Gross per 24 hour  Intake 880 ml  Output --  Net 880 ml         Physical Exam: Vital Signs Blood pressure 138/74, pulse (!) 45, temperature 97.9 F (36.6 C), temperature source Oral, resp. rate 18, height '6\' 3"'  (1.905 m), weight 118.1 kg, SpO2 98 %.  General: No acute distress Mood and affect are appropriate Heart: Regular rate and rhythm no rubs murmurs or extra sounds Lungs: Clear to auscultation, breathing unlabored, no rales or wheezes Abdomen: Positive bowel sounds, soft nontender to palpation, nondistended Extremities: No clubbing, cyanosis, or edema  Skin: No evidence of breakdown, no evidence of rash Neurologic: Cranial nerves II through XII intact, motor strength is 5/5 in left 4/5 RIght deltoid, bicep, tricep, grip, hip flexor, knee extensors, ankle dorsiflexor and plantar flexor ongoing receptive aphasia, "demonstrates comprehension and purposeful responses to verbal cueing. Good standing balance  RIght field cut  Musculoskeletal: Full range of motion in all 4 extremities. No joint swelling   Assessment/Plan: 1. Functional deficits which require 3+ hours per day of interdisciplinary therapy in a comprehensive inpatient rehab setting. Physiatrist is providing close team supervision and 24 hour management of active medical problems listed below. Physiatrist and rehab team continue to assess barriers to discharge/monitor patient progress toward  functional and medical goals  Care Tool:  Bathing    Body parts bathed by patient: Right arm, Chest, Abdomen, Front perineal area, Buttocks, Right upper leg, Face, Left lower leg, Right lower leg, Left upper leg, Left arm   Body parts bathed by helper: Left arm     Bathing assist Assist Level: Supervision/Verbal cueing     Upper Body Dressing/Undressing Upper body dressing   What is the patient wearing?: Pull over shirt    Upper body assist Assist Level: Supervision/Verbal cueing    Lower Body Dressing/Undressing Lower body dressing      What is the patient wearing?: Pants, Underwear/pull up     Lower body assist Assist for lower body dressing: Contact Guard/Touching assist (standing)     Toileting Toileting    Toileting assist Assist for toileting: Contact Guard/Touching assist     Transfers Chair/bed transfer  Transfers assist     Chair/bed transfer assist level: Supervision/Verbal cueing     Locomotion Ambulation   Ambulation assist      Assist level: Supervision/Verbal cueing Assistive device: No Device Max distance: 200'   Walk 10 feet activity   Assist     Assist level: Contact Guard/Touching assist Assistive device: No Device   Walk 50 feet activity   Assist    Assist  level: Contact Guard/Touching assist Assistive device: No Device    Walk 150 feet activity   Assist    Assist level: Contact Guard/Touching assist Assistive device: No Device    Walk 10 feet on uneven surface  activity   Assist Walk 10 feet on uneven surfaces activity did not occur: Safety/medical concerns         Wheelchair     Assist Will patient use wheelchair at discharge?: No Type of Wheelchair: Manual Wheelchair activity did not occur: Safety/medical concerns         Wheelchair 50 feet with 2 turns activity    Assist    Wheelchair 50 feet with 2 turns activity did not occur: Safety/medical concerns       Wheelchair 150  feet activity     Assist  Wheelchair 150 feet activity did not occur: Safety/medical concerns       Blood pressure 138/74, pulse (!) 45, temperature 97.9 F (36.6 C), temperature source Oral, resp. rate 18, height '6\' 3"'  (1.905 m), weight 118.1 kg, SpO2 98 %.   Medical Problem List and Plan: 1.  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  Status post thrombectomy.             -patient may shower             -ELOS/Goals: 2-3 weeks- supervision to min A-    -Continue CIR- PT, OT and SLP Team conference today please see physician documentation under team conference tab, met with team  to discuss problems,progress, and goals. Formulized individual treatment plan based on medical history, underlying problem and comorbidities.  Discussed that physically pt doing well , anticipate long term SLP needs post D/C, no driving  2.  Antithrombotics: -DVT/anticoagulation: Lovenox             -antiplatelet therapy: Aspirin 81 mg daily and Plavix 75 mg daily x90 days then aspirin alone beginning 08/08/2021 3. Pain Management: Tylenol as needed 4. Mood: Ativan 0.5 mg every 4 hours as needed anxiety             -antipsychotic agents: N/A 5. Neuropsych: This patient is not capable of making decisions on his own behalf. 6. Skin/Wound Care: Routine skin checks 7. Fluids/Electrolytes/Nutrition: Routine in and outs with follow-up chemistries 8.  Dysphagia.  on regular diet now 9.  Acute respiratory failure with hypoxia and hypercapnia.  Gram-positive cocci pneumonia.  Ceftriaxone 05/10/2021 x 5 to 7 days.  Resolved 11.  Hypertension.  HCTZ 25 mg daily, clonidine 0.2 mg every 8 hours, Norvasc 10 mg daily, lisinopril 20 mg daily.  Monitor with increased mobility Vitals:   05/24/21 1922 05/25/21 0018  BP: 129/88 138/74  Pulse: (!) 45   Resp: 18   Temp: 97.9 F  (36.6 C)   SpO2: 98%    6/22 controlled  12.  Hyperlipidemia.  Lipitor 13.  Obesity.  BMI 34.28.  Dietary follow-up 14.  Tobacco abuse.  NicoDerm patch.  Provide counseling 15.  Hypokalemia - recent K+ 4.0 16.  Pre renal azotemia- likely due to reduced fluid intake plus diuretic,  Now off HCTZ, also d/ced ACE     6/20- BUN 28 and Cr 1.51- somewhat better- push fluids.  17. Constipation  6/20- LBM at least 5 days ago- will order Sorbitol.   LOS: 13 days A FACE TO FACE EVALUATION WAS PERFORMED  Luanna Salk Makhayla Mcmurry 05/25/2021, 9:27 AM

## 2021-05-25 NOTE — Progress Notes (Signed)
Physical Therapy Discharge Summary  Patient Details  Name: Peter Lucas MRN: [id number] Date of Birth: [birth year]  Today's Date: 05/25/2021 PT Individual Time: 4734-0370 PT Individual Time Calculation (min): 43 min    Patient has met 10 of 10 long term goals due to improved activity tolerance, improved balance, improved postural control, increased strength, decreased pain, improved attention, and improved awareness.  Patient to discharge at an ambulatory level Supervision.   Patient's care partner is independent to provide the necessary physical and cognitive assistance at discharge.  Reasons goals not met: all goals met  Recommendation:  Patient will benefit from ongoing skilled PT services in home health setting to continue to advance safe functional mobility, address ongoing impairments in strength, coordination, balance, activity tolerance, cognition, safety awareness, and to minimize fall risk.  Equipment: No equipment provided  Reasons for discharge: treatment goals met and discharge from hospital  Patient/family agrees with progress made and goals achieved: Yes  PT Discharge Precautions/Restrictions Precautions Precautions: Fall Precaution Comments: globally aphasic, mild R hemi UE> LE, R inattention Vital Signs Therapy Vitals Temp: 98.4 F (36.9 C) Temp Source: Oral Pulse Rate: (!) 48 Resp: 18 BP: 126/71 Patient Position (if appropriate): Lying Oxygen Therapy SpO2: 99 % O2 Device: Room Air Pain Pain Assessment Pain Scale: 0-10 Pain Score: 0-No pain Faces Pain Scale: No hurt Vision/Perception  Vision - Assessment Eye Alignment: Within Functional Limits Alignment/Gaze Preference: Gaze left Tracking/Visual Pursuits: Impaired - to be further tested in functional context Additional Comments: improved attention to R, especially seen in improved gait and maneuvering obstacles on R side Praxis Praxis: Impaired Praxis Impairment Details: Ideation Praxis-Other  Comments: Intermittent confusion as to context of daily used items - improving  Cognition Overall Cognitive Status: Impaired/Different from baseline Arousal/Alertness: Awake/alert Orientation Level: Other (comment) (Difficult to assess d/t continued global aphasia) Problem Solving: Impaired Problem Solving Impairment: Verbal basic;Functional basic Executive Function: Initiating Initiating: Impaired Safety/Judgment: Impaired Sensation Sensation Light Touch: Appears Intact Coordination Gross Motor Movements are Fluid and Coordinated: Yes Fine Motor Movements are Fluid and Coordinated: No Coordination and Movement Description: mild R hemi RUE> RLE Heel Shin Test: difficult to follow instruction d/t global aphasia; peforms well with visual demonstration bilaterally Motor  Motor Motor: Hemiplegia Motor - Discharge Observations: R side weakness UE>LE  Mobility Bed Mobility Bed Mobility: Supine to Sit;Sit to Supine Supine to Sit: Independent Sit to Supine: Independent Transfers Transfers: Sit to Stand;Stand to Sit Sit to Stand: Independent Stand to Sit: Independent Stand Pivot Transfers: Supervision/Verbal cueing Transfer (Assistive device): None Locomotion  Gait Ambulation: Yes Gait Assistance: Supervision/Verbal cueing Gait Distance (Feet): 300 Feet Assistive device: None Gait Assistance Details: minimal cues for scanning R field of vision Gait Gait: Yes Gait Pattern: Within Functional Limits Gait velocity: WFL Stairs / Additional Locomotion Stairs: Yes Stairs Assistance: Supervision/Verbal cueing Stair Management Technique: No rails Number of Stairs: 4 Height of Stairs: 6 Ramp: Supervision/Verbal cueing Curb: Supervision/Verbal cueing Wheelchair Mobility Wheelchair Mobility: No  Trunk/Postural Assessment  Cervical Assessment Cervical Assessment: Exceptions to [facility] (forward head) Thoracic Assessment Thoracic Assessment: Exceptions to [facility] (rounded shoulders) Lumbar  Assessment Lumbar Assessment: Exceptions to [facility] (posterior pelvic tilt) Postural Control Postural Control: Within Functional Limits  Balance Balance Balance Assessed: Yes Standardized Balance Assessment Standardized Balance Assessment: Berg Balance Test Berg Balance Test Sit to Stand: Able to stand without using hands and stabilize independently Standing Unsupported: Able to stand safely 2 minutes Sitting with Back Unsupported but Feet Supported on Floor or Stool: Able to sit safely  and securely 2 minutes Stand to Sit: Sits safely with minimal use of hands Transfers: Able to transfer safely, definite need of hands Standing Unsupported with Eyes Closed: Able to stand 10 seconds safely Standing Ubsupported with Feet Together: Able to place feet together independently and stand 1 minute safely From Standing, Reach Forward with Outstretched Arm: Can reach confidently >25 cm (10") From Standing Position, Pick up Object from Floor: Able to pick up shoe safely and easily From Standing Position, Turn to Look Behind Over each Shoulder: Turn sideways only but maintains balance Turn 360 Degrees: Able to turn 360 degrees safely in 4 seconds or less Standing Unsupported, Alternately Place Feet on Step/Stool: Able to stand independently and safely and complete 8 steps in 20 seconds Standing Unsupported, One Foot in Front: Able to plae foot ahead of the other independently and hold 30 seconds Standing on One Leg: Able to lift leg independently and hold > 10 seconds Total Score: 52 Static Sitting Balance Static Sitting - Balance Support: Feet supported Static Sitting - Level of Assistance: 7: Independent Dynamic Sitting Balance Dynamic Sitting - Balance Support: Feet supported;During functional activity Dynamic Sitting - Level of Assistance: 7: Independent Dynamic Sitting - Balance Activities: Reaching for objects;Reaching across midline;Forward lean/weight shifting;Lateral lean/weight  shifting;Reaching for weighted objects;Ball toss Theatre stage manager Standing - Balance Support: No upper extremity supported Static Standing - Level of Assistance: 7: Independent Static Standing - Comment/# of Minutes: up to 5min Dynamic Standing Balance Dynamic Standing - Balance Support: During functional activity;No upper extremity supported Dynamic Standing - Level of Assistance: 5: Stand by assistance Dynamic Standing - Balance Activities: [employer];Reaching for objects;Compliant surfaces;Reaching for weighted objects;Reaching across midline;Kicking ball Dynamic Standing - Comments: SUP Extremity Assessment      RLE Assessment RLE Assessment: Within Functional Limits LLE Assessment LLE Assessment: Within Functional Limits General Strength Comments: grossly 4+ to 5/5  Therapy Session Note: Session 1: Patient seated up in recliner on entrance to room. Patient alert and agreeable to PT session. Patient denied pain during session.  Therapeutic Activity: Bed Mobility: Patient performed supine <> sit with Mod I. No vc required. Transfers: Patient performed STS and SPVT transfers with Mod I to push from seat and to control descent to sit. No vc required. Car transfer with supervision performed with LLE stepping in first as well as turning to sit first.  Gait Training:  Patient ambulated >250 feet with no AD and supervision assist. Demonstrated improved awareness of objects/ obstacles on R. Provided vc for direction and pt able to follow vc.   Steps completed using R side HR with reciprocating gait pattern and supervision.  Neuromuscular Re-ed: NMR facilitated during session with focus onstanding balance/ tolerance, sequence of alphabet, and R field of vision. Pt guided in use of BITS to locate moving target about screen with majority of targets localized to R side of screen. Pt seen scanning entire screen and finding all targets. He is 87.1% accurate during 12min hitting 81  targets with 2.21 sec reaction time. Progressed to sequence of alphabet scattered on screen with increasing difficiulty requiring verbal and visual assist starting with "D" and then ending with "W". Pt is 41.27%accurate, taking 33min 34 sec, and 17.48sec  reaction time. NMR performed for improvements in motor control and coordination, balance, sequencing, judgement, and self confidence/ efficacy in performing all aspects of mobility at highest level of independence.   Daughter present for entire session and educated on pt's improved but continuing cut of R field  of vision. Plan is to either stand to pt's R side or be aware of objects (including people) to pt's R side.   Patient seated in recliner at end of session with brakes locked, seat alarm set, and all needs within reach.  Session 2: Patient supine in bed on entrance to room. Patient alert and agreeable to PT session. Patient denied pain during session.  Therapeutic Activity: Pt guided around room gathering items to pack up things he should not forget. No LOB, good choices in approaches for items. Problem solving items that are trash vs important to go home.   Gait Training:  Patient ambulated >300 feet with no AD and supervision for R attention. Avoidance of objects to R side much improved.   Neuromuscular Re-ed: NMR facilitated during session with focus on completion of standing balance outcome measure. Pt guided in Berg Balance test. Final score = 52/ 56 indicating a Low Fall Risk. NMR performed for improvements in motor control and coordination, balance, sequencing, judgement, and self confidence/ efficacy in performing all aspects of mobility at highest level of independence.   Patient supine  in bed at end of session with brakes locked, bed alarm set, and all needs within reach.   Alger Simons 05/25/2021, 4:24 PM

## 2021-05-25 NOTE — Progress Notes (Signed)
Please address this patient has not had a documented BM since 05/17/21, prior medication has shown no results

## 2021-05-25 NOTE — Patient Care Conference (Signed)
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 Alysia Penna Care Coodinator Present: Dorien Chihuahua, RN, BSN, CRRN;Christina Luray, [place] Nurse Present: Dorien Chihuahua, RN PT Present: Barrie Folk, PT OT Present: Other (comment) [place] Lanius, OT) SLP Present: Other (comment) Gunnar Fusi, SLP) PPS Coordinator present : Gunnar Fusi, SLP     Current Status/Progress Goal Weekly Team Focus  Bowel/Bladder   Patienti is incontinent /continent of B/B, lbm 05/17/21, po stool softners, and laxative administered 0n 05/23/21, no results  To regain B&B continence  Time toileting Q2hrs/prn Assess QS/PRN   Swallow/Nutrition/ Hydration             ADL's   S for UBD, full-body bathing, toilet/shower transfers; CGA toileting, toileting; cont to require heavy cuing, but improved command follow  S  overall cognition during functional tasks including: executive functioning, command following, safety awareness, attention to R side. ADL retraining, pt/family education, balance   Mobility   Bed mobility = Mod I; Transfers = Mod I; Gait = >300 ft with no AD and CGA/ SUP for improving R inattention; reduced but improving safety awareness  Bed mobility at Mod I; Transfers at SUP; Gait at [facility] for >200 feet; Steps with SUP and RHR  family ed, improving R attention,  following instructions, safety awareness   Communication   mod A  modA basic comprehension and expression (multimodal)  education, writting, matching photos to words/phrases, naming, automatic language   Safety/Cognition/ Behavioral Observations  Mod A, once pt compreheneds task, max-mod A verbal error awareness  modA basic problem solving and safety  education, basic problem solving, attention and awareness   Pain   Denies pain  Keep patient pain free  Assess Pain QS/PRN   Skin   Skin intact  maintain patient's skin integrity  Assess QS/PRN skin     Discharge Planning:  discharge back home with children and siblings to assist with care   Team Discussion: Wernicke's aphasia issues persist however has some automatic responses. Physically doing well and on target for discharge.  Patient on target to meet rehab goals: yes, currently at or exceeded goal level. Requires supervisiton for direction and physical instructions. Mod assist goals set for communication and cognition and patient has met those as well. On a regular thin liquid diet.  *See Care Plan and progress notes for long and short-term goals.   Revisions to Treatment Plan:    Teaching Needs:  Family education completed 05/25/21 for discharge.  Current Barriers to Discharge: Decreased caregiver support and Home enviroment access/layout  Possible Resolutions to Barriers: Practice and family education     Medical Summary Current Status: no change with Wernicke's aphasia, BP controlle d, cont  Barriers to Discharge: Other (comments)  Barriers to Discharge Comments: severe receptive aphasia Possible Resolutions to [employer] Focus: Additional family ed today with D/C in am  Continued Need for Acute Rehabilitation Level of Care: The patient requires daily medical management by a physician with specialized training in physical medicine and rehabilitation for the following reasons: Direction of a multidisciplinary  physical rehabilitation program to maximize functional independence : Yes Medical management of patient stability for increased activity during participation in an intensive rehabilitation regime.: Yes Analysis of laboratory values and/or radiology reports with any subsequent need for medication adjustment and/or medical intervention. : Yes   I attest that I was present, lead the team conference, and concur with the assessment and plan of the team.   Dorien Chihuahua B 05/25/2021, 11:39 AM

## 2021-05-25 NOTE — Plan of Care (Signed)
  Problem: RH Grooming Goal: LTG Patient will perform grooming w/assist,cues/equip (OT) Description: LTG: Patient will perform grooming with assist, with/without cues using equipment (OT) Outcome: Completed/Met   Problem: RH Bathing Goal: LTG Patient will bathe all body parts with assist levels (OT) Description: LTG: Patient will bathe all body parts with assist levels (OT) Outcome: Completed/Met   Problem: RH Dressing Goal: LTG Patient will perform upper body dressing (OT) Description: LTG Patient will perform upper body dressing with assist, with/without cues (OT). Outcome: Completed/Met Goal: LTG Patient will perform lower body dressing w/assist (OT) Description: LTG: Patient will perform lower body dressing with assist, with/without cues in positioning using equipment (OT) Outcome: Completed/Met   Problem: RH Toileting Goal: LTG Patient will perform toileting task (3/3 steps) with assistance level (OT) Description: LTG: Patient will perform toileting task (3/3 steps) with assistance level (OT)  Outcome: Completed/Met   Problem: RH Functional Use of Upper Extremity Goal: LTG Patient will use RT/LT upper extremity as a (OT) Description: LTG: Patient will use right/left upper extremity as a stabilizer/gross assist/diminished/nondominant/dominant level with assist, with/without cues during functional activity (OT) Outcome: Completed/Met   Problem: RH Toilet Transfers Goal: LTG Patient will perform toilet transfers w/assist (OT) Description: LTG: Patient will perform toilet transfers with assist, with/without cues using equipment (OT) Outcome: Completed/Met   Problem: RH Tub/Shower Transfers Goal: LTG Patient will perform tub/shower transfers w/assist (OT) Description: LTG: Patient will perform tub/shower transfers with assist, with/without cues using equipment (OT) Outcome: Completed/Met   Problem: RH Furniture Transfers Goal: LTG Patient will perform furniture transfers  w/assist (OT/PT) Description: LTG: Patient will perform furniture transfers  with assistance (OT/PT). Outcome: Completed/Met

## 2021-05-25 NOTE — Progress Notes (Signed)
Patient ID: Peter Lucas, male   DOB: 05/22/1966, 55 y.o.   MRN: [id number]  [facility] referral sent to [facility] [place], [place] [phone]

## 2021-05-26 NOTE — Progress Notes (Addendum)
PROGRESS NOTE   Subjective/Complaints:  Wernicke's aphasia persists   ROS: limited due to aphasia  Objective:   No results found. Recent Labs    05/24/21 0501  WBC 7.9  HGB 12.2*  HCT 37.6*  PLT 335     No results for input(s): NA, K, CL, CO2, GLUCOSE, BUN, CREATININE, CALCIUM in the last 72 hours.   Intake/Output Summary (Last 24 hours) at 05/26/2021 0910 Last data filed at 05/26/2021 0700 Gross per 24 hour  Intake 840 ml  Output --  Net 840 ml         Physical Exam: Vital Signs Blood pressure (!) 158/89, pulse (!) 49, temperature (!) 97.4 F (36.3 C), resp. rate 18, height 6\' 3"  (1.905 m), weight 118.1 kg, SpO2 99 %.   General: No acute distress Mood and affect are appropriate Heart: Regular rate and rhythm no rubs murmurs or extra sounds Lungs: Clear to auscultation, breathing unlabored, no rales or wheezes Abdomen: Positive bowel sounds, soft nontender to palpation, nondistended Extremities: No clubbing, cyanosis, or edema Skin: No evidence of breakdown, no evidence of rash   Skin: No evidence of breakdown, no evidence of rash Neurologic: Cranial nerves II through XII intact, motor strength is 5/5 in left 4/5 RIght deltoid, bicep, tricep, grip, hip flexor, knee extensors, ankle dorsiflexor and plantar flexor ongoing receptive aphasia, "demonstrates comprehension and purposeful responses to verbal cueing. Good standing balance  RIght field cut  Musculoskeletal: Full range of motion in all 4 extremities. No joint swelling   Assessment/Plan: 1. Functional deficits Stable for D/C today F/u PCP in 3-4 weeks F/u PM&R 2 weeks F/u neuro 14mo See D/C summary See D/C instructions  Care Tool:  Bathing    Body parts bathed by patient: Right arm, Chest, Abdomen, Front perineal area, Buttocks, Right upper leg, Face, Left lower leg, Right lower leg, Left upper leg, Left arm   Body parts bathed by  helper: Left arm     Bathing assist Assist Level: Supervision/Verbal cueing     Upper Body Dressing/Undressing Upper body dressing   What is the patient wearing?: Pull over shirt    Upper body assist Assist Level: Supervision/Verbal cueing    Lower Body Dressing/Undressing Lower body dressing      What is the patient wearing?: Pants, Underwear/pull up     Lower body assist Assist for lower body dressing: Supervision/Verbal cueing     Toileting Toileting    Toileting assist Assist for toileting: Supervision/Verbal cueing     Transfers Chair/bed transfer  Transfers assist     Chair/bed transfer assist level: Supervision/Verbal cueing     Locomotion Ambulation   Ambulation assist      Assist level: Supervision/Verbal cueing Assistive device: No Device Max distance: 350'   Walk 10 feet activity   Assist     Assist level: Supervision/Verbal cueing Assistive device: No Device   Walk 50 feet activity   Assist    Assist level: Supervision/Verbal cueing Assistive device: No Device    Walk 150 feet activity   Assist    Assist level: Supervision/Verbal cueing Assistive device: No Device    Walk 10 feet on uneven surface  activity  Assist Walk 10 feet on uneven surfaces activity did not occur: Safety/medical concerns   Assist level: Supervision/Verbal cueing Assistive device: Other (comment) (no AD)   Wheelchair     Assist Will patient use wheelchair at discharge?: No Type of Wheelchair: Manual Wheelchair activity did not occur: Safety/medical concerns         Wheelchair 50 feet with 2 turns activity    Assist    Wheelchair 50 feet with 2 turns activity did not occur: Safety/medical concerns       Wheelchair 150 feet activity     Assist  Wheelchair 150 feet activity did not occur: Safety/medical concerns       Blood pressure (!) 158/89, pulse (!) 49, temperature (!) 97.4 F (36.3 C), resp. rate 18, height  6\' 3"  (1.905 m), weight 118.1 kg, SpO2 99 %.   Medical Problem List and Plan: 1.  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  Status post thrombectomy.             -patient may shower             -ELOS/Goals: 2-3 weeks- supervision to min A-    -Continue CIR- PT, OT and SLP   Discussed that physically pt doing well , anticipate long term SLP needs post D/C, no driving  2.  Antithrombotics: -DVT/anticoagulation: Lovenox             -antiplatelet therapy: Aspirin 81 mg daily and Plavix 75 mg daily x90 days then aspirin alone beginning 08/08/2021 3. Pain Management: Tylenol as needed 4. Mood: Ativan 0.5 mg every 4 hours as needed anxiety             -antipsychotic agents: N/ei5. Neuropsych: should be able to sign POA with visual cuieng 6. Skin/Wound Care: Routine skin checks 7. Fluids/Electrolytes/Nutrition: Routine in and outs with follow-up chemistries 8.  Dysphagia.  on regular diet now 9.  Acute respiratory failure with hypoxia and hypercapnia.  Gram-positive cocci pneumonia.  Ceftriaxone 05/10/2021 x 5 to 7 days.  Resolved 11.  Hypertension.  HCTZ 25 mg daily, clonidine 0.2 mg every 8 hours, Norvasc 10 mg daily, lisinopril 20 mg daily.  Monitor with increased mobility Vitals:   05/26/21 0117 05/26/21 0432  BP: 134/86 (!) 158/89  Pulse:  (!) 49  Resp:  18  Temp:  (!) 97.4 F (36.3 C)  SpO2:  99%   6/23 controlled  12.  Hyperlipidemia.  Lipitor 13.  Obesity.  BMI 34.28.  Dietary follow-up 14.  Tobacco abuse.  NicoDerm patch.  Provide counseling 15.  Hypokalemia - recent K+ 4.0 16.  Pre renal azotemia- likely due to reduced fluid intake plus diuretic,  Now off HCTZ, also d/ced ACE     6/20- BUN 28 and Cr 1.51- somewhat better- push fluids.  17. Constipation  6/20- LBM at least 5 days ago- will order Sorbitol.   LOS: 14 days A FACE TO  FACE EVALUATION WAS PERFORMED  7/20 05/26/2021, 9:10 AM

## 2021-05-26 NOTE — Progress Notes (Signed)
Inpatient Rehabilitation Care Coordinator Discharge Note  The overall goal for the admission was met for:   Discharge location: Yes, home  Length of Stay: Yes, 14 Days  Discharge activity level: Yes, Sup  Home/community participation: Yes  Services provided included: MD, RD, PT, OT, SLP, RN, CM, TR, Pharmacy, Neuropsych, and SW  Financial Services: Private Insurance: [place] offered to/list presented to:PT and Daughter [name])  Follow-up services arranged: Home Health: [facility]   Comments (or additional information): PT OT ST  Patient/Family verbalized understanding of follow-up arrangements: Yes  Individual responsible for coordination of the follow-up plan: [name] [phone]  Confirmed correct DME delivered: Dyanne Iha 05/26/2021    Dyanne Iha

## 2021-05-26 NOTE — Progress Notes (Signed)
Patient ID: Peter Lucas, male   DOB: 1965/12/28, 55 y.o.   MRN: [id number]  SW left VM with [facility] in [place], [place] at [phone] to establish PCP.   Per daughter request [name])  [place], [place] [phone]

## 2021-05-26 NOTE — Progress Notes (Signed)
Patient discharged via wheelchair by staff with family by side. Patient and family voices understanding of discharge instructions

## 2021-05-30 ENCOUNTER — Telehealth: Payer: Self-pay | Admitting: *Deleted

## 2021-05-30 NOTE — Telephone Encounter (Signed)
Jennifer ST [facility] called and asked ST POC 1wk1 2wk2 0wk1 2wk4 1wk1.  Approval given.

## 2021-05-30 NOTE — Telephone Encounter (Signed)
Transitional Care Call completed Contacted and spoke with patients daughter [name] as directed on social work discharge note Appointment confirmed, address confirmed. Patient discharged 05/26/2021 from [facility]   1. Are you/is patient experiencing any problems since coming home?  No Are there any questions regarding any aspect of care? No  2. Are there any questions regarding medications administration/dosing?  No Are meds being taken as prescribed? Yes Patient should review meds with caller to confirm   3. Have there been any falls? No  4. Has Home Health been to the house and/or have they contacted you? Speech therapy has been to the house.  OT comes tomorrow. Still waiting on PT If not, have you tried to contact them? Can we help you contact them?   5. Are bowels and bladder emptying properly?  Yes Are there any unexpected incontinence issues? No If applicable, is patient following bowel/bladder programs?   6. Any fevers, problems with breathing, unexpected pain? No  7. Are there any skin problems or new areas of breakdown? No  8. Has the patient/family member arranged specialty MD follow up (ie cardiology/neurology/renal/surgical/etc)?  Yes Can we help arrange? No  9. Does the patient need any other services or support that we can help arrange?  No  10. Are caregivers following through as expected in assisting the patient? Multiple family members assisting in home care  11. Has the patient quit smoking, drinking alcohol, or using drugs as recommended?  No drug use, No drinking, No smoking, Patient using nicoderm

## 2021-06-03 ENCOUNTER — Other Ambulatory Visit: Payer: Self-pay | Admitting: *Deleted

## 2021-06-03 ENCOUNTER — Telehealth: Payer: Self-pay | Admitting: *Deleted

## 2021-06-03 MED ORDER — POLYETHYLENE GLYCOL 3350 17 G PO PACK: 17.0000 g | PACK | Freq: Every day | ORAL | 0 refills | Status: DC

## 2021-06-03 NOTE — Telephone Encounter (Signed)
Esther OT [facility] called for POC 1wk2 and 2wk2.  Approval given.

## 2021-06-07 ENCOUNTER — Telehealth: Payer: Self-pay | Admitting: *Deleted

## 2021-06-07 NOTE — Telephone Encounter (Signed)
Peter Lucas ST called to adjust the PCO to beginning 06/06/21 2wk1,0wk1,2wk4,1wk1.  Approval given.

## 2021-06-08 ENCOUNTER — Encounter: Payer: Self-pay | Admitting: Registered Nurse

## 2021-06-08 ENCOUNTER — Encounter: Payer: BC Managed Care – PPO | Attending: Registered Nurse | Admitting: Registered Nurse

## 2021-06-08 ENCOUNTER — Other Ambulatory Visit: Payer: Self-pay

## 2021-06-08 VITALS — BP 121/85 | HR 84 | Temp 98.2°F | Ht 75.0 in | Wt 255.4 lb

## 2021-06-08 DIAGNOSIS — I639 Cerebral infarction, unspecified: Secondary | ICD-10-CM | POA: Insufficient documentation

## 2021-06-08 DIAGNOSIS — I10 Essential (primary) hypertension: Secondary | ICD-10-CM | POA: Diagnosis present

## 2021-06-08 NOTE — Progress Notes (Signed)
Subjective:    Patient ID: Peter Lucas, male    DOB: 1966/09/18, 55 y.o.   MRN: [id number]  HPI: Peter Lucas is a 55 y.o. male who is here for Transitional Care Visit for follow up of his Ischemic Cerebrovascular accidental of frantal lobe and essential Hypertension.  He presented to [facility] on 05/07/2020 via EMS: Per Neurology H&P  Patient lives by himself, was fine yesterday. Got up at 0500, went to work at [employer]. Was noted by coworkers to have difficulty with language. EMS called and he was brought in to the ED.  CT Head WO Contrast:  IMPRESSION: 1. 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 ASPECTS 7. 2. No hemorrhagic transformation or mass effect. Suspicion of a posterior left MCA branch occlusion at the posterior sylvian fissure.  CT Angio:  IMPRESSION: 1. Positive for occlusion of a posterior Left MCA branch, distal M2 versus proximal M3. Associated posterior left MCA territory infarct core of 42 mL and 70 mL estimated penumbra by CTP (mismatch of 28 mL). This was discussed with Dr. Erick Blinks on 05/07/2021 beginning at 1135 hours.   2. 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 - moderate to severe stenoses of the Right PCA P1/P2 junction. 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  MR Brain:  IMPRESSION: 1. Large, confluent acute posterior left MCA territory infarct with smaller acute infarcts scattered throughout the left frontotemporal lobes and left insular region. Associated edema and regional mass effect without midline shift. Areas of petechial hemorrhage along the posteromedial aspect of the confluent hemorrhage. 2. Curvilinear susceptibility artifact within the posterior aspect of the left sylvian fissure, extending into the region of confluent infarct, most likely  representing distal left MCA thrombus that was better characterized on prior vascular studies. 3. Paranasal sinus mucosal thickening and bilateral mastoid effusions.  S/P Thrombectomy on 05/07/2021.   Mr. Carriere was admitted to inpatient rehabilitation on 05/12/2021 and discharged home on 05/26/2021. He is receiving Home Health Therapy with [facility]. He denies any pain. He rates his pain 0.    Pain Inventory Average Pain 0 Pain Right Now 0 My pain is  NO PAIN  LOCATION OF PAIN  NO PAIN  BOWEL Number of stools per week: 3-4 Oral laxative use No  Type of laxative None used Enema or suppository use No  History of colostomy No  Incontinent No   BLADDER Normal In and out cath, frequency N/A Able to self cath No  Bladder incontinence No  Frequent urination No  Leakage with coughing No  Difficulty starting stream No  Incomplete bladder emptying No    Mobility walk without assistance ability to climb steps?  yes do you drive?  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  yes  Neuro/Psych No problems in this area  Prior Studies New Patient  Physicians involved in your care New Patient   No family history on file. Social History   Socioeconomic History   Marital status: Unknown    Spouse name: Not on file   Number of children: Not on file   Years of education: Not on file   Highest education level: Not on file  Occupational History   Not on file  Tobacco Use   Smoking status: Every Day  Pack years: 0.00   Smokeless tobacco: Not on file  Substance and Sexual Activity   Alcohol use: Not on file   Drug use: Not on file   Sexual activity: Not on file  Other Topics Concern   Not on file  Social History Narrative   Not on file   Social Determinants of Health   Financial Resource Strain: Not on file  Food Insecurity: Not on file  Transportation Needs: Not on  file  Physical Activity: Not on file  Stress: Not on file  Social Connections: Not on file   Past Surgical History:  Procedure Laterality Date   BACK SURGERY     cyst removal   IR CT HEAD LTD  05/07/2021   IR PERCUTANEOUS ART THROMBECTOMY/INFUSION INTRACRANIAL INC DIAG ANGIO  05/07/2021   RADIOLOGY WITH ANESTHESIA N/A 05/07/2021   Procedure: IR WITH ANESTHESIA;  Surgeon: Radiologist, Medication, MD;  Location: [facility] OR;  Service: Radiology;  Laterality: N/A;   History reviewed. No pertinent past medical history. Ht 6\' 3"  (1.905 m)   BMI 32.54 kg/m   Opioid Risk Score:   Fall Risk Score:  `1  Depression screen PHQ 2/9  No flowsheet data found.  Review of Systems  Neurological:  Positive for speech difficulty.  All other systems reviewed and are negative.     Objective:   Physical Exam Vitals and nursing note reviewed.  Constitutional:      Appearance: Normal appearance.  Cardiovascular:     Rate and Rhythm: Normal rate and regular rhythm.     Pulses: Normal pulses.     Heart sounds: Normal heart sounds.  Pulmonary:     Effort: Pulmonary effort is normal.     Breath sounds: Normal breath sounds.  Musculoskeletal:     Cervical back: Normal range of motion and neck supple.     Comments: Normal Muscle Bulk and Muscle Testing Reveals:  Upper Extremities: Full ROM and Muscle Strength  5/5 Lower Extremities : Full ROM and Muscle Strength 5/5 Arises from Table with ease Narrow Based Gait     Skin:    General: Skin is warm and dry.  Neurological:     Mental Status: He is alert and oriented to person, place, and time.  Psychiatric:        Mood and Affect: Mood normal.        Behavior: Behavior normal.         Assessment & Plan:  Ischemic Cerebrovascular accidental of frantal lobe: Continue Home Health Therapy with [facility]. He has a HFU appointment with Neurology. Continue current medication regimen. Continue to monitor.  2.Essential Hypertension: Continue  current medication Regimen. PCP following.   F/U with Dr 4/9 in 4-6 weeks

## 2021-06-08 NOTE — Patient Instructions (Addendum)
Call to schedule HFU appointment with Dr [name] [phone]  [address]   Please  Try to Obtain a Primary Physician for Peter Lucas

## 2021-06-09 ENCOUNTER — Telehealth: Payer: Self-pay | Admitting: *Deleted

## 2021-06-09 NOTE — Telephone Encounter (Signed)
Esther OT [facility] is requesting a call back to speak with someone about Mr Comp. I attempted to call her back but got VM. Left message to call back.

## 2021-06-13 NOTE — Telephone Encounter (Signed)
Called Peter Lucas she already has answer

## 2021-06-15 ENCOUNTER — Encounter: Payer: Self-pay | Admitting: Registered Nurse

## 2021-06-20 ENCOUNTER — Telehealth: Payer: Self-pay | Admitting: *Deleted

## 2021-06-20 DIAGNOSIS — I639 Cerebral infarction, unspecified: Secondary | ICD-10-CM

## 2021-06-20 NOTE — Telephone Encounter (Signed)
Darral Dash OT [facility] called to request Mr Brinegar be transitioned to outpt OT/ST @ [facility] ph# [phone]. Orders placed. Esther OT notified they have been placed.

## 2021-06-28 ENCOUNTER — Telehealth: Payer: Self-pay | Admitting: *Deleted

## 2021-06-28 NOTE — Telephone Encounter (Signed)
Esther OT [facility] called for extension 2wk2.  Approval given.

## 2021-07-12 ENCOUNTER — Other Ambulatory Visit: Payer: Self-pay

## 2021-07-12 ENCOUNTER — Encounter: Payer: Self-pay | Admitting: Adult Health

## 2021-07-12 ENCOUNTER — Ambulatory Visit (INDEPENDENT_AMBULATORY_CARE_PROVIDER_SITE_OTHER): Payer: BC Managed Care – PPO | Admitting: Adult Health

## 2021-07-12 ENCOUNTER — Telehealth: Payer: Self-pay | Admitting: *Deleted

## 2021-07-12 ENCOUNTER — Encounter: Payer: Self-pay | Admitting: *Deleted

## 2021-07-12 VITALS — BP 147/99 | HR 81 | Ht 72.0 in | Wt 264.0 lb

## 2021-07-12 DIAGNOSIS — E119 Type 2 diabetes mellitus without complications: Secondary | ICD-10-CM

## 2021-07-12 DIAGNOSIS — E785 Hyperlipidemia, unspecified: Secondary | ICD-10-CM

## 2021-07-12 DIAGNOSIS — Z87891 Personal history of nicotine dependence: Secondary | ICD-10-CM

## 2021-07-12 DIAGNOSIS — I10 Essential (primary) hypertension: Secondary | ICD-10-CM | POA: Diagnosis not present

## 2021-07-12 DIAGNOSIS — I639 Cerebral infarction, unspecified: Secondary | ICD-10-CM

## 2021-07-12 DIAGNOSIS — I69320 Aphasia following cerebral infarction: Secondary | ICD-10-CM

## 2021-07-12 NOTE — Telephone Encounter (Signed)
Esther [facility] OT is requesting additional 2wk2 extension on OT. This will complete the cert period.  Approval given.

## 2021-07-12 NOTE — Patient Instructions (Addendum)
Continue working with home health therapies and transition to outpatient therapies once completed -please let me know if you need assistance or orders with this transition  Okay to slowly decrease 24-hour supervision based on how he does - start with just a couple hours at a time. Sometimes, use of cameras in the home or safety alert buttons can be beneficial.   Continue aspirin 81 mg daily and clopidogrel 75 mg daily  and atorvastatin 40 mg for secondary stroke prevention Stop Plavix after 9/5 as 3 months of aspirin and Plavix combo completed  Continue to follow up with PCP regarding cholesterol, blood pressure and diabetes management  Maintain strict control of hypertension with blood pressure goal below 130/90, diabetes with hemoglobin A1c goal below 7% and cholesterol with LDL cholesterol (bad cholesterol) goal below 70 mg/dL.   Great job with quitting smoking!  Continue tobacco cessation will be very important for further stroke prevention measures     Followup in the future with me in 3 months or call earlier if needed       Thank you for coming to see [place] at [facility]. I hope we have been able to provide you high quality care today.  You may receive a patient satisfaction survey over the next few weeks. We would appreciate your feedback and comments so that we may continue to improve ourselves and the health of our patients.     Speech-Language Therapy After a Stroke  A stroke can damage the part of the brain that affects speech and language. You may have difficulty finding words for what you want to say, or understanding what people are saying. These exercises will help you to learn to communicateagain. Speech-Language Therapy Speech-language therapy is a common treatment during stroke recovery. It may include exercising your speech muscles and practicing speech. It may help you to: Learn to talk and have conversations. Use the right words to express  ideas. Learn to speak more clearly so others can understand you. Use gestures, sign language, or other methods to express yourself (aided communication). These can be used in place of speech or to add to it. Some aided communication may involve use of electronic devices. Restore the ability to read and write. Improve your swallowing muscles to assist in eating. Assess your ability to swallow various foods safely. Exercising your speech muscles These may include: Practicing swallowing while tilting your head or holding down your chin in a chin-tuck position. Holding your breath while swallowing, followed by a cough. Tongue movements, such as sticking out your tongue, holding it against the roof of your mouth, and moving it from side to side. Puckering your lips as if to give a kiss. Practicing smiles and frowns. Attempting to make various consonant and vowel sounds. Other communication exercises Summary A stroke can cause damage to the part of your brain that affects speech, the ability to swallow, and the ability to use and understand language. Speech-language therapy is a common treatment after a stroke. It can help you to learn to communicate again. This information is not intended to replace advice given to you by your health care provider. Make sure you discuss any questions you have with your healthcare provider. Document Revised: 10/16/2020 Document Reviewed: 10/16/2020 Elsevier Patient Education  2022 [employer].

## 2021-07-12 NOTE — Progress Notes (Signed)
[facility] [address] [place]. [place] [address] [phone]       HOSPITAL FOLLOW UP NOTE  [name] Date of Birth:  [birth year] Medical Record Number:  [id number]   Reason for Referral:  hospital stroke follow up    SUBJECTIVE:   CHIEF COMPLAINT:  Chief Complaint  Patient presents with   Follow-up    RM 2 with son Peter Lucas  PT is well, son states he has some speech complications but physically he is doing well.     HPI:   Peter Lucas is a 55 year old male w/pmh of smoking who presented on 05/07/2021 with dense aphasia. Personally reviewed hospitalization from progress notes, lab work and imaging summary provided.  Evaluated by Dr. 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  EF 55 to 60%.  LDL 86.  A1c 10.1 - no prior DM hx.  Recommended DAPT for 3 months then aspirin alone due to reocclusion during thrombectomy as well as atorvastatin 40 mg daily.  Close monitoring of BP initially on Cleviprex - resumed home meds at discharge.  Other stroke risk factors include tobacco use, obesity and suspected sleep apnea.  No prior stroke history.  Residual aphasia, right hemiparesis and decreased functional mobility.  Discharged to CIR on 05/12/2021 - 05/26/2021.   Today, 07/12/2021, [name] is being seen for hospital follow-up accompanied by his son, Peter Lucas.  Reports residual language difficulties - word finding or saying inappropriate word and mild comprehension difficulty- does report improvement daily. No residual physical deficits. Does ADLs and majority of IADLs independently although family continues to provide 24-hour supervision as instructed at discharge. Currently working with OT/SLP at [facility] and plans on transitioning to outpatient therapies once completed. Denies new stroke/TIA symptoms.   Compliant on aspirin, Plavix and atorvastatin -mild bruising but no other side effects.  Blood pressure today 147/99. Not routinely monitored at home.  Routinely monitors glucose levels which have been stable.  Reports complete tobacco cessation with use of nicotine patches.  No further concerns at this time.     Diagnostic imaging  05/07/21 CT Head  1. 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 ASPECTS 7. 2. No hemorrhagic transformation or mass effect. Suspicion of a posterior left MCA branch occlusion at the posterior sylvian fissure.   05/07/21 CTA Head and Neck  1. Positive for occlusion of a posterior Left MCA branch, distal M2 versus proximal M3. Associated posterior left MCA territory infarct core of 42 mL and 70 mL estimated penumbra by CTP (mismatch of 28 mL). 2. 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 - moderate to severe stenoses of the Right PCA P1/P2 junction. 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   05/09/21 MRI Brain WO Contrast  1. Large, confluent acute posterior left MCA territory infarct with smaller acute infarcts scattered throughout the left frontotemporal lobes and left insular region. Associated edema and regional mass effect without midline shift. Areas of petechial hemorrhage along the posteromedial aspect of the confluent hemorrhage. 2. Curvilinear susceptibility artifact within the posterior aspect of the left sylvian fissure, extending into the region of confluent infarct, most likely representing distal left MCA thrombus that was better characterized on prior vascular studies. 3. Paranasal sinus mucosal  thickening and bilateral mastoid effusions.   05/08/21 Echo Complete w/Bubble  1. Left ventricular ejection fraction, by estimation, is 55 to 60%. The left ventricle has normal  function. The left ventricle has no regional wall motion abnormalities. There is moderate left ventricular hypertrophy.  Left ventricular diastolic parameters are indeterminate.   2. Right ventricular systolic function is normal. The right ventricular size is normal. Tricuspid regurgitation signal is inadequate for assessing PA pressure.   3. The mitral valve is grossly normal. Trivial mitral valve regurgitation.   4. The aortic valve is tricuspid. Aortic valve regurgitation is not visualized.   5. The inferior vena cava is normal in size with greater than 50% respiratory variability, suggesting right atrial pressure of 3 mmHg.   6. No visualization of agitated saline bubbles in right heart with reported injection from IV access both upper extremities, possibly inadequate agitation prior to injection. Consider repeat study if clinically indicated.     ROS:   14 system review of systems performed and negative with exception of those listed in HPI  PMH:  Past Medical History:  Diagnosis Date   DM (diabetes mellitus) (HCC)    Hyperlipidemia    Hypertension     PSH:  Past Surgical History:  Procedure Laterality Date   BACK SURGERY     cyst removal   IR CT HEAD LTD  05/07/2021   IR PERCUTANEOUS ART THROMBECTOMY/INFUSION INTRACRANIAL INC DIAG ANGIO  05/07/2021   RADIOLOGY WITH ANESTHESIA N/A 05/07/2021   Procedure: IR WITH ANESTHESIA;  Surgeon: Radiologist, Medication, MD;  Location: [facility] OR;  Service: Radiology;  Laterality: N/A;    Social History:  Social History   Socioeconomic History   Marital status: Unknown    Spouse name: Not on file   Number of children: Not on file   Years of education: Not on file   Highest education level: Not on file  Occupational History   Not on file  Tobacco Use   Smoking status: Former    Types: Cigarettes   Smokeless tobacco: Never  Vaping Use   Vaping Use: Never used  Substance and Sexual Activity   Alcohol use: Not Currently   Drug use: Never    Sexual activity: Not on file  Other Topics Concern   Not on file  Social History Narrative   Not on file   Social Determinants of Health   Financial Resource Strain: Not on file  Food Insecurity: Not on file  Transportation Needs: Not on file  Physical Activity: Not on file  Stress: Not on file  Social Connections: Not on file  Intimate Partner Violence: Not on file    Family History:  Family History  Problem Relation Age of Onset   Heart failure Mother    Stroke Mother     Medications:   Current Outpatient Medications on File Prior to Visit  Medication Sig Dispense Refill   acetaminophen (TYLENOL) 325 MG tablet Take 2 tablets (650 mg total) by mouth every 4 (four) hours as needed for mild pain (or temp > 37.5 C (99.5 F)).     amLODipine (NORVASC) 10 MG tablet Take 1 tablet (10 mg total) by mouth daily. 30 tablet 0   aspirin EC 81 MG EC tablet Take 1 tablet (81 mg total) by mouth daily. Swallow whole.     atorvastatin (LIPITOR) 40 MG tablet Take 1 tablet (40 mg total) by mouth daily. 30 tablet 0   cloNIDine (CATAPRES) 0.3 MG tablet Take 1 tablet (0.3 mg  total) by mouth every 8 (eight) hours. 60 tablet 11   clopidogrel (PLAVIX) 75 MG tablet Take 1 tablet (75 mg total) by mouth daily. 30 tablet 1   glipiZIDE (GLUCOTROL) 5 MG tablet Take 1 tablet (5 mg total) by mouth daily before breakfast. 30 tablet 0   hydrALAZINE (APRESOLINE) 10 MG tablet Take 1 tablet (10 mg total) by mouth every 8 (eight) hours. 90 tablet 0   nicotine (NICODERM CQ - DOSED IN MG/24 HOURS) 21 mg/24hr patch 21 mg patch daily x1 week then 14 mg patch daily x3 weeks then 7 mg patch daily x3 weeks and stop 28 patch 0   polyethylene glycol (MIRALAX / GLYCOLAX) 17 g packet Take 17 g by mouth daily. 14 each 0   No current facility-administered medications on file prior to visit.    Allergies:  No Known Allergies    OBJECTIVE:  Physical Exam  Vitals:   07/12/21 1012  BP: (!) 147/99  Pulse: 81  Weight:  264 lb (119.7 kg)  Height: 6' (1.829 m)   Body mass index is 35.8 kg/m. No results found.  Post stroke PHQ 2/9 Depression screen PHQ 2/9 06/08/2021  Decreased Interest 1  Down, Depressed, Hopeless 0  PHQ - 2 Score 1  Altered sleeping 0  Tired, decreased energy 0  Change in appetite 0  Feeling bad or failure about yourself  0  Trouble concentrating 1  Moving slowly or fidgety/restless 0  Suicidal thoughts 0  PHQ-9 Score 2     General: well developed, well nourished, very pleasant middle-age Caucasian male, seated, in no evident distress Head: head normocephalic and atraumatic.   Neck: supple with no carotid or supraclavicular bruits Cardiovascular: regular rate and rhythm, no murmurs Musculoskeletal: no deformity Skin:  no rash/petichiae Vascular:  Normal pulses all extremities   Neurologic Exam Mental Status: Awake and fully alert. Expressive > receptive aphasia.  Difficulty with repetition.  Able to follow simple step commands with some difficulty.  Difficulty reading.  Difficulty fully assessing cognition due to aphasia.  Mood and affect appropriate.  Cranial Nerves: Fundoscopic exam reveals sharp disc margins. Pupils equal, briskly reactive to light. Extraocular movements full without nystagmus. Visual fields full to confrontation. Hearing intact. Facial sensation intact. Face, tongue, palate moves normally and symmetrically.  Motor: Normal bulk and tone. Normal strength in all tested extremity muscles Sensory.: intact to touch , pinprick , position and vibratory sensation.  Coordination: Rapid alternating movements normal in all extremities. Finger-to-nose and heel-to-shin performed accurately bilaterally. Gait and Station: Arises from chair without difficulty. Stance is normal. Gait demonstrates normal stride length and balance without use of assistive device. Tandem walk and heel toe mild difficulty.  Reflexes: 1+ and symmetric. Toes downgoing.     NIHSS  1 Modified  Rankin  3      ASSESSMENT: Peter Lucas is a 55 y.o. year old male with recent acute/subacute ischemic stroke within the left parietal temporal area on 05/07/2021 in the setting of distal left M2 occlusion s/p IR initially with TICI 2C reperfusion with reoccluded vessel. Vascular risk factors include HTN, HLD, new dx of DM, intracranial atherosclerosis, tobacco use, obesity and suspected OSA.      PLAN:  Ischemic stroke:  Residual deficit: Expressive> receptive aphasia.  Continue HH therapies and likely transition to outpatient therapy once completed Continue aspirin 81 mg daily and clopidogrel 75 mg daily  and atorvastatin 40 mg daily for secondary stroke prevention.  Advised to stop Plavix on 08/08/2021 as  3 months DAPT completed at that time.   Discussed secondary stroke prevention measures and importance of close PCP follow up for aggressive stroke risk factor management. I have gone over the pathophysiology of stroke, warning signs and symptoms, risk factors and their management in some detail with instructions to go to the closest emergency room for symptoms of concern. HTN: BP goal <130/90.  Stable on current regimen per PCP HLD: LDL goal <70. Recent LDL 86 -continue atorvastatin 40 mg daily.  DMII: A1c goal<7.0. Recent A1c 10.1.  Routinely monitored at home - stable per report.  Tobacco use: Congratulated on complete tobacco cessation and highly encouraged continued abstinence    Follow up in 3 months or call earlier if needed   CC:  [facility] provider: Dr. Pearlean Brownie PCP: Carren Rang, PA-C    I spent 56 minutes of face-to-face and non-face-to-face time with patient and son.  This included previsit chart review including recent hospitalization, lab review, study review, electronic health record documentation, patient and son education and discussion regarding recent stroke and likely etiology, secondary stroke prevention measures and aggressive stroke risk factor management, residual  deficits and typical recovery time, and answered all questions to patient and sons satisfaction   Ihor Austin, AGNP-BC  [facility] [address] [place], [place] [address]  Phone [phone] Fax [phone] Note: This document was prepared with digital dictation and possible smart phrase technology. Any transcriptional errors that result from this process are unintentional.

## 2021-07-13 NOTE — Telephone Encounter (Signed)
Opened in error

## 2021-07-14 ENCOUNTER — Encounter: Payer: BC Managed Care – PPO | Admitting: Physical Medicine & Rehabilitation

## 2021-07-18 NOTE — Progress Notes (Signed)
I agree with the above plan 

## 2021-07-26 ENCOUNTER — Encounter
Payer: BC Managed Care – PPO | Attending: Physical Medicine & Rehabilitation | Admitting: Physical Medicine & Rehabilitation

## 2021-07-26 ENCOUNTER — Other Ambulatory Visit: Payer: Self-pay

## 2021-07-26 ENCOUNTER — Encounter: Payer: Self-pay | Admitting: Physical Medicine & Rehabilitation

## 2021-07-26 VITALS — BP 165/100 | HR 73 | Temp 98.6°F | Ht 72.0 in | Wt 263.0 lb

## 2021-07-26 DIAGNOSIS — I639 Cerebral infarction, unspecified: Secondary | ICD-10-CM | POA: Insufficient documentation

## 2021-07-26 NOTE — Progress Notes (Signed)
Subjective:    Patient ID: Peter Lucas, male    DOB: October 26, 1966, 55 y.o.   MRN: [id number] 55 y.o. right-handed male with unremarkable past medical history who has not seen a PCP for many years with history of tobacco abuse obesity BMI 34.28 on no prescription medications.  Per chart review patient lives alone 1 level home and independent prior to admission.  He has good family support.  Presented 05/07/2021 with acute onset of aphasia and right side weakness.  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  No hemorrhagic transformation.  Patient did not receive tPA.  CTA of the head and neck positive for occlusion of the posterior left MCA branch distal M2 versus proximal M3.  Patient underwent thrombectomy per interventional radiology.  Latest cranial CT scan showed more pronounced low density within the left MCA branch vessel infarction.  No midline shift or hemorrhage.  Echocardiogram ejection fraction of 55 to 60% no wall motion abnormalities.  Admission chemistries unremarkable except glucose 185 alcohol negative WBC 11,100.  Patient did require initial intubation after thrombectomy was ultimately extubated but became hypoxic requiring reintubation final extubation 05/09/2021.  He did spike a low-grade fever respiratory cultures grew gram-positive cocci in pairs and chains maintained on Rocephin 05/11/2019 22 x 5 to 7 days.  Currently maintained on aspirin and Plavix for CVA prophylaxis.  Subcutaneous Lovenox for DVT prophylaxis.  Close monitoring of blood pressure initially on Cleviprex since discontinued.  Findings of elevated hemoglobin A1c 10.1 sliding scale insulin added.  Therapy evaluations completed due to patient's and apraxia.  Decreased functional mobility was admitted for a comprehensive rehab program.    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 Patient has been doing well at home according to son.  Has been receiving home health PT OT and speech therapy. No longer homebound asking for referral to outpatient therapy at [facility] The patient has followed up with PCP. Pain Inventory Average Pain 0 Pain Right Now 0 My pain is  no pain  LOCATION OF PAIN  no pain   BOWEL Number of stools per week: 5  BLADDER Normal    Mobility walk without assistance ability to climb steps?  yes do you drive?  no  Function Do you have any goals in this area?  no  Neuro/Psych No problems in this area  Prior Studies Any changes since last visit?  no  Physicians involved in your care Any changes since last visit?  no   Family History  Problem Relation Age of Onset   Heart failure Mother    Stroke Mother    Social History   Socioeconomic History   Marital status: Unknown    Spouse name: Not on file   Number of children: Not on file   Years of education: Not on file   Highest education level: Not on file  Occupational History   Not on file  Tobacco Use   Smoking status: Former    Types: Cigarettes   Smokeless tobacco: Never  Vaping Use   Vaping Use: Never used  Substance and Sexual Activity   Alcohol use: Not Currently   Drug use: Never   Sexual activity: Not on file  Other Topics Concern   Not on file  Social History Narrative   Not on file   Social Determinants of Health   Financial Resource Strain: Not on file  Food Insecurity: Not on file  Transportation Needs: Not on file  Physical Activity: Not on file  Stress: Not on file  Social Connections: Not on file   Past Surgical History:  Procedure Laterality Date   BACK SURGERY     cyst removal   IR CT HEAD LTD  05/07/2021   IR PERCUTANEOUS ART THROMBECTOMY/INFUSION INTRACRANIAL INC DIAG ANGIO  05/07/2021    RADIOLOGY WITH ANESTHESIA N/A 05/07/2021   Procedure: IR WITH ANESTHESIA;  Surgeon: Radiologist, Medication, MD;  Location: [facility] OR;  Service: Radiology;  Laterality: N/A;   Past Medical History:  Diagnosis Date   DM (diabetes mellitus) (HCC)    Hyperlipidemia    Hypertension    BP (!) 165/100   Pulse 73   Temp 98.6 F (37 C)   Ht 6' (1.829 m)   Wt 263 lb (119.3 kg)   SpO2 98%   BMI 35.67 kg/m   Opioid Risk Score:   Fall Risk Score:  `1  Depression screen PHQ 2/9  Depression screen PHQ 2/9 06/08/2021  Decreased Interest 1  Down, Depressed, Hopeless 0  PHQ - 2 Score 1  Altered sleeping 0  Tired, decreased energy 0  Change in appetite 0  Feeling bad or failure about yourself  0  Trouble concentrating 1  Moving slowly or fidgety/restless 0  Suicidal thoughts 0  PHQ-9 Score 2    Review of Systems  Constitutional: Negative.   HENT: Negative.    Eyes: Negative.   Respiratory: Negative.    Cardiovascular: Negative.   Gastrointestinal: Negative.   Endocrine: Negative.   Genitourinary: Negative.   Musculoskeletal: Negative.   Skin: Negative.   Allergic/Immunologic: Negative.   Neurological: Negative.   Hematological: Negative.   Psychiatric/Behavioral: Negative.    All other systems reviewed and are negative.     Objective:   Physical Exam Vitals and nursing note reviewed.  Constitutional:      Appearance: He is obese.  HENT:     Head: Normocephalic and atraumatic.  Eyes:     General: Visual field deficit present.     Extraocular Movements: Extraocular movements intact.     Conjunctiva/sclera: Conjunctivae normal.     Pupils: Pupils are equal, round, and reactive to light.  Cardiovascular:     Rate and Rhythm: Normal rate and regular rhythm.     Heart sounds: Normal heart sounds.  Pulmonary:     Effort: Pulmonary effort is normal. No respiratory distress.     Breath sounds: Normal breath sounds.  Abdominal:     General: Abdomen is flat. Bowel sounds are  normal.     Palpations: Abdomen is soft.  Musculoskeletal:     Cervical back: Normal range of motion.     Comments: No pain with upper extremity or lower extremity range of motion  Skin:    General: Skin is warm and dry.  Neurological:     Mental Status: He is alert.     Motor: No weakness, tremor or abnormal muscle tone.     Coordination: Coordination abnormal.     Comments: Aphasic, unable to name simple objects cannot repeat Is able to follow gestural cues  Gait is wide-based neutral drag or knee instability Motor strength 5/5 bilateral deltoid, bicep, tricep, grip, hip flexor, knee extensor, ankle dorsiflexor plantar flexor Unable to assess sensation secondary to aphasia.  Assessment & Plan:  1.  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  We will make referral to outpatient PT OT speech Physical medicine rehab follow-up in 2 months Recommend follow-up with neurology just saw neurology nurse practitioner on 07/12/2021  Follow-up with primary care

## 2021-08-09 ENCOUNTER — Telehealth: Payer: Self-pay

## 2021-08-09 NOTE — Telephone Encounter (Signed)
Pt daughter called stated that patient has reached the time for Long term disability and she states [employer] has reached out to the office to get disability forms filled out and would them sent as soon as possible. Also patient work would like to know the work status for patient. Fax work status to [name] [phone].

## 2021-08-09 NOTE — Telephone Encounter (Signed)
[name] daughter of patient called and wanted to know about disability.

## 2021-08-10 ENCOUNTER — Ambulatory Visit: Payer: BC Managed Care – PPO | Attending: Physical Medicine & Rehabilitation

## 2021-08-10 ENCOUNTER — Other Ambulatory Visit: Payer: Self-pay

## 2021-08-10 ENCOUNTER — Ambulatory Visit: Payer: BC Managed Care – PPO

## 2021-08-10 ENCOUNTER — Ambulatory Visit: Payer: BC Managed Care – PPO | Admitting: Speech Pathology

## 2021-08-10 DIAGNOSIS — R2689 Other abnormalities of gait and mobility: Secondary | ICD-10-CM

## 2021-08-10 DIAGNOSIS — I63312 Cerebral infarction due to thrombosis of left middle cerebral artery: Secondary | ICD-10-CM | POA: Diagnosis present

## 2021-08-10 DIAGNOSIS — I63512 Cerebral infarction due to unspecified occlusion or stenosis of left middle cerebral artery: Secondary | ICD-10-CM | POA: Diagnosis present

## 2021-08-10 DIAGNOSIS — R41841 Cognitive communication deficit: Secondary | ICD-10-CM | POA: Diagnosis present

## 2021-08-10 DIAGNOSIS — R2681 Unsteadiness on feet: Secondary | ICD-10-CM | POA: Insufficient documentation

## 2021-08-10 DIAGNOSIS — M6281 Muscle weakness (generalized): Secondary | ICD-10-CM | POA: Diagnosis present

## 2021-08-10 DIAGNOSIS — R482 Apraxia: Secondary | ICD-10-CM

## 2021-08-10 DIAGNOSIS — I639 Cerebral infarction, unspecified: Secondary | ICD-10-CM | POA: Diagnosis not present

## 2021-08-10 DIAGNOSIS — R278 Other lack of coordination: Secondary | ICD-10-CM | POA: Insufficient documentation

## 2021-08-10 DIAGNOSIS — R4701 Aphasia: Secondary | ICD-10-CM | POA: Insufficient documentation

## 2021-08-10 NOTE — Therapy (Signed)
[place] [facility] [address] [place], [place], [address] Phone: [phone]   Fax:  [phone]  Occupational Therapy Evaluation  Patient Details  Name: Peter Lucas MRN: [id number] Date of Birth: Aug 29, 1966 Referring Provider (OT): Dr. Claudette LawsAndrew Lucas  Encounter Date: 08/10/2021   OT End of Session - 08/10/21 1012     Visit Number 1    Number of Visits 16    Date for OT Re-Evaluation 10/04/21    Authorization Time Period Reporting period beginning 08/10/2021    OT Start Time 0900    OT Stop Time 0952    OT Time Calculation (min) 52 min    Activity Tolerance Patient tolerated treatment well    Behavior [facility] tasks assessed/performed             Past Medical History:  Diagnosis Date   DM (diabetes mellitus) (HCC)    Hyperlipidemia    Hypertension     Past Surgical History:  Procedure Laterality Date   BACK SURGERY     cyst removal   IR CT HEAD LTD  05/07/2021   IR PERCUTANEOUS ART THROMBECTOMY/INFUSION INTRACRANIAL INC DIAG ANGIO  05/07/2021   RADIOLOGY WITH ANESTHESIA N/A 05/07/2021   Procedure: IR WITH ANESTHESIA;  Surgeon: Radiologist, Medication, MD;  Location: [facility] OR;  Service: Radiology;  Laterality: N/A;    There were no vitals filed for this visit.   Subjective Assessment - 08/10/21 1008     Subjective  "He's been staying alone for more periods and doing well." (per daughter)    Patient is accompanied by: Family member    Pertinent History L MCA, aphasia, apraxia, decreased balance, R sided weaknes    Limitations aphasia    Patient Stated Goals "To cook and pay bills."    Currently in Pain? No/denies    Pain Score 0-No pain    Multiple Pain Sites No               [facility] OT Assessment - 08/10/21 0907       Assessment   Medical Diagnosis CVA    Referring Provider (OT) Dr. Claudette LawsAndrew Lucas    Onset Date/Surgical Date 05/07/21    Hand Dominance Right    Next MD Visit Septh 19th  with PCP    Prior Therapy CIR      Precautions   Precautions None      Restrictions   Weight Bearing Restrictions No      Home  Environment   Family/patient expects to be discharged to: Private residence    Living Arrangements Alone    Available Help at Discharge Family    Type of Home House    Home Layout One level   basement     Prior Function   Level of Independence Independent    Vocation Full time employment    Engineer, miningVocation Requirements store manager at [employer]    Leisure hunt, fish, antique shopping, softball, cornhole      ADL   ADL comments Indep with basic ADLs      IADL   Meal Prep Able to complete simple cold meal and snack prep   Pt can use his microwave but has not yet attempted stove top or grilling   Medication Management Takes responsibility if medication is prepared in advance in seperate dosage    Prior Level of Function Financial Management indep    Financial Management Dependent      Written Expression  Dominant Hand Right    Handwriting 100% legible    Written Experience Exceptions to [facility]    Self Formulation Ability Word   difficulty deciphering which letters were needed to print first name, specific difficulty constructing the letter Y; signature WNL     Vision - History   Baseline Vision Wears glasses only for reading    Additional Comments Pt reports worsening distance vision; daughter scheduled eye doctor appt for Sept 22      Vision Assessment   Ocular Range of Motion Within Functional Limits    Tracking/Visual Pursuits Able to track stimulus in all quads without difficulty    Saccades Additional eye shifts occurred during testing    Convergence Within functional limits    Visual Fields No apparent deficits      Cognition   Cognition Comments global aphasia      Observation/Other Assessments   Focus on Therapeutic Outcomes (FOTO)  91      Sensation   Light Touch Appears Intact      Coordination   Gross Motor Movements are Fluid and  Coordinated No   mildly impaired   Fine Motor Movements are Fluid and Coordinated No   mildly impaired   Right 9 Hole Peg Test 33    Left 9 Hole Peg Test 24      AROM   Overall AROM Comments BUEs WNL      Strength   Overall Strength Comments BUEs 5/5 (except dominant R grip is weaker than L)      Hand Function   Right Hand Grip (lbs) 75    Right Hand Lateral Pinch 23 lbs    Right Hand 3 Point Pinch 19 lbs    Left Hand Grip (lbs) 86    Left Hand Lateral Pinch 23 lbs    Left 3 point pinch 20 lbs           Occupational Therapy Evaluation: Pt is a 55 y/o male present today with his daughter, Peter Lucas.  Pt referred to outpatient OT following L MCA stroke, with mild R hand weakness and coordination impairment, balance deficits, global aphasia, and IADL decline.  Since discharge from CIR, pt has returned home and had initial 24 hr supv from his adult children, but has more recently been able to stay longer periods during the day and over night alone.  Adult children and pt's sister assist with meals, transportation, and paying bills.  Pt is managing his basic self care independently.  Pt's goal is to be able to use the stove top and pay his bills independently.  R dominant hand slightly weaker with mild ataxia as compared to L non-dominant hand.  Pt will benefit from skilled OT to address IADLs and instruct in HEP for R hand to address deficits noted above.  Pt's goal is to return to being indep at home.        OT Education - 08/10/21 1012     Education Details OT role, goals, poc    Person(s) Educated Patient;Child(ren)    Methods Explanation;Verbal cues    Comprehension Verbalized understanding;Verbal cues required;Need further instruction              OT Short Term Goals - 08/10/21 1016       OT SHORT TERM GOAL #1   Title Pt will independently perform HEP for improving R hand strength and coordination.    Baseline Eval: Issued blue putty at eval; further instruction needed     Time 4  Period Weeks    Status New    Target Date 09/06/21               OT Long Term Goals - 08/10/21 1017       OT LONG TERM GOAL #1   Title Pt will improve FOTO score to 97 or better to indicate increased functional performance.    Baseline Eval: FOTO: 91    Time 8    Period Weeks    Status New    Target Date 10/04/21      OT LONG TERM GOAL #2   Title Pt will improve R grip strength by 10 or more lbs to enable gripping/carrying heavy objects/grocery bags with R dominant hand.    Baseline Eval: R (dominant) grip 75 lbs, L grip 86 lbs    Time 8    Period Weeks    Status New    Target Date 10/04/21      OT LONG TERM GOAL #3   Title Pt will be indep to perform hot meal prep using stove top.    Baseline Eval: pt has not yet attempted stove top at home but can operate his microwave.    Time 8    Period Weeks    Status New    Target Date 10/04/21      OT LONG TERM GOAL #4   Title Pt will be independent to manage household bills.    Baseline Eval: Daughter currently manages all of pt's bills.    Time 8    Period Weeks    Status New    Target Date 10/04/21      OT LONG TERM GOAL #5   Title Pt will be independent to construct letters/print to enable making lists, writing letters, filling out medical forms.    Baseline Eval: Extra time to construct letters to print name    Time 8    Period Weeks    Status New    Target Date 10/04/21                Plan - 08/10/21 1046     Clinical Impression Statement Pt is a 55 y/o male present today with his daughter, Peter Lucas.  Pt referred to outpatient OT following L MCA stroke, with mild R hand weakness and coordination impairment, balance deficits, global aphasia, and IADL decline.  Since discharge from CIR, pt has returned home and had initial 24 hr supv from his adult children, but has more recently been able to stay longer periods during the day and over night alone.  Adult children and pt's sister assist with  meals, transportation, and paying bills.  Pt is managing his basic self care independently.  Pt's goal is to be able to use the stove top and pay his bills independently.  R dominant hand slightly weaker with mild ataxia as compared to L non-dominant hand.  Pt will benefit from skilled OT to address IADLs and instruct in HEP for R hand to address deficits noted above.  Pt's goal is to return to being indep at home.    OT Occupational Profile and History Detailed Assessment- Review of Records and additional review of physical, cognitive, psychosocial history related to current functional performance    Occupational performance deficits (Please refer to evaluation for details): IADL's;Leisure;Work    Games developer / Function / Physical Skills Dexterity;Strength;Coordination;FMC;UE functional use;GMC;IADL    Cognitive Skills Understand;Problem Solve;Safety Awareness    Rehab Potential Good    Clinical Decision  Making Several treatment options, min-mod task modification necessary    Comorbidities Affecting Occupational Performance: May have comorbidities impacting occupational performance    Modification or Assistance to Complete Evaluation  Min-Moderate modification of tasks or assist with assess necessary to complete eval    OT Frequency 2x / week    OT Duration 8 weeks    OT Treatment/Interventions Therapeutic exercise;Cognitive remediation/compensation;Neuromuscular education;Therapeutic activities;Patient/family education;Self-care/ADL training;Balance training    Consulted and Agree with Plan of Care Patient;Family member/caregiver             Patient will benefit from skilled therapeutic intervention in order to improve the following deficits and impairments:   Body Structure / Function / Physical Skills: Dexterity, Strength, Coordination, FMC, UE functional use, GMC, IADL Cognitive Skills: Understand, Problem Solve, Safety Awareness     Visit Diagnosis: 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 [place], [place], [address] Phone: [phone]   Fax:  [phone]  Name: Peter Lucas MRN: [id number] Date of Birth: [birth year]

## 2021-08-10 NOTE — Therapy (Signed)
[facility] MAIN [facility] SERVICES [address] [place], [place], [address] Phone: [phone]   Fax:  [phone]  Physical Therapy Evaluation  Patient Details  Name: Peter Lucas MRN: [id number] Date of Birth: 1966-03-31 Referring Provider (PT): Erick Colace  Encounter Date: 08/10/2021   PT End of Session - 08/11/21 0714     Visit Number 1    Number of Visits 16    Date for PT Re-Evaluation 10/06/21    Authorization Type 1/10 eval 08/10/21    PT Start Time 0750    PT Stop Time 0844    PT Time Calculation (min) 54 min    Equipment Utilized During Treatment Gait belt    Activity Tolerance Patient tolerated treatment well    Behavior During Therapy WFL for tasks assessed/performed             Past Medical History:  Diagnosis Date   DM (diabetes mellitus) (HCC)    Hyperlipidemia    Hypertension     Past Surgical History:  Procedure Laterality Date   BACK SURGERY     cyst removal   IR CT HEAD LTD  05/07/2021   IR PERCUTANEOUS ART THROMBECTOMY/INFUSION INTRACRANIAL INC DIAG ANGIO  05/07/2021   RADIOLOGY WITH ANESTHESIA N/A 05/07/2021   Procedure: IR WITH ANESTHESIA;  Surgeon: Radiologist, Medication, MD;  Location: [facility] OR;  Service: Radiology;  Laterality: N/A;    There were no vitals filed for this visit.    Subjective Assessment - 08/10/21 0833     Subjective Patient is a 55 y.o male who presents to physical therapy s/p ischemic CVA of frontal lobe    Patient is accompained by: Family member    Pertinent History Patient is a 55 y.o male who presents to physical therapy s/p ischemic CVA of frontal lobe.  Patient was Independent prior to CVA working at [employer].  He has good family support with multiple family members in the area. PMH includes CKD, DM stroke, HLD, HTN.  Presented 05/07/2021 with acute onset of aphasia and right side weakness.  Cranial CT scan showed posterior left MCA infarction with fairly well-developed cytotoxic  edema.  No hemorrhagic transformation.  Patient did not receive tPA.  CTA of head and neck positive for occlusion of the posterior left MCA branch, distal M2 versus proximal M3.  Patient underwent thrombectomy per interventional radiology.  Latest cranial CT scan showed more pronounced low density within the left MCA branch vessel infarction.  No midline shift or hemorrhage.  Echocardiogram ejection fraction of 55 to 60% no wall motion abnormalities  Patient did require initial intubation after thrombectomy was ultimately extubated but became hypoxic requiring reintubation with final extubation 05/09/2021.  He did spike a low-grade fever respiratory cultures. Therapy evaluations completed due to patient's decreased functional mobility and aphasia was admitted for a comprehensive rehab program.  Patient transferred to [facility] on 05/12/2021 . He was discharged 05/26/21 from [facility] and underwent Home Health therapy for OT and SLP which ended the week of August 8th.  Patient is able to mow, walk, and perform ADLs.  His balance is challenged per daughter report as well has his ability to negotiate stairs.  Has an eye physician appointment on September 27    Limitations Lifting;Standing;Walking;House hold activities    How long can you sit comfortably? n/a    How long can you stand comfortably? no limitations    How long can you walk comfortably? store    Patient Stated Goals  Daughter reports goals are to improve strength balance and to give home program for strengthening    Currently in Pain? No/denies                [facility] PT Assessment - 08/11/21 0001       Assessment   Medical Diagnosis CVA    Referring Provider (PT) Erick Colace    Onset Date/Surgical Date 05/07/21    Hand Dominance Right    Next MD Visit Septh 19th with PCP    Prior Therapy [facility]      Precautions   Precautions None    Required Braces or Orthoses --   used to wear a knee brace     Restrictions   Weight Bearing Restrictions No       Balance Screen   Has the patient fallen in the past 6 months No    Has the patient had a decrease in activity level because of a fear of falling?  Yes    Is the patient reluctant to leave their home because of a fear of falling?  Yes      Home Environment   Living Environment Private residence    Living Arrangements Children;Other (Comment)   sister, children come in shifts to help   Type of Home House    Home Access Stairs to enter    Entrance Stairs-Number of Steps 3    Entrance Stairs-Rails Right    Home Layout One level   has a basement     Prior Function   Level of Independence Independent with basic ADLs    Leisure hunt, fish, antique shopping, softball, cornhole      Observation/Other Assessments   Focus on Therapeutic Outcomes (FOTO)  68      Standardized Balance Assessment   Standardized Balance Assessment Berg Balance Test      Berg Balance Test   Sit to Stand Able to stand without using hands and stabilize independently    Standing Unsupported Able to stand safely 2 minutes    Sitting with Back Unsupported but Feet Supported on Floor or Stool Able to sit safely and securely 2 minutes    Stand to Sit Sits safely with minimal use of hands    Transfers Able to transfer safely, definite need of hands    Standing Unsupported with Eyes Closed Able to stand 10 seconds with supervision    Standing Unsupported with Feet Together Able to place feet together independently and stand for 1 minute with supervision    From Standing, Reach Forward with Outstretched Arm Can reach forward >12 cm safely (5")    From Standing Position, Pick up Object from Floor Able to pick up shoe, needs supervision    From Standing Position, Turn to Look Behind Over each Shoulder Looks behind one side only/other side shows less weight shift    Turn 360 Degrees Able to turn 360 degrees safely but slowly    Standing Unsupported, Alternately Place Feet on Step/Stool Able to stand independently and  complete 8 steps >20 seconds    Standing Unsupported, One Foot in Front Able to take small step independently and hold 30 seconds    Standing on One Leg Able to lift leg independently and hold 5-10 seconds    Total Score 44      Functional Gait  Assessment   Gait assessed  Yes    Gait Level Surface Walks 20 ft in less than 5.5 sec, no assistive devices, good speed, no  evidence for imbalance, normal gait pattern, deviates no more than 6 in outside of the 12 in walkway width.    Change in Gait Speed Able to change speed, demonstrates mild gait deviations, deviates 6-10 in outside of the 12 in walkway width, or no gait deviations, unable to achieve a major change in velocity, or uses a change in velocity, or uses an assistive device.    Gait with Horizontal Head Turns Performs head turns smoothly with slight change in gait velocity (eg, minor disruption to smooth gait path), deviates 6-10 in outside 12 in walkway width, or uses an assistive device.    Gait with Vertical Head Turns Performs task with slight change in gait velocity (eg, minor disruption to smooth gait path), deviates 6 - 10 in outside 12 in walkway width or uses assistive device    Gait and Pivot Turn Pivot turns safely in greater than 3 sec and stops with no loss of balance, or pivot turns safely within 3 sec and stops with mild imbalance, requires small steps to catch balance.    Step Over Obstacle Is able to step over one shoe box (4.5 in total height) but must slow down and adjust steps to clear box safely. May require verbal cueing.    Gait with Narrow Base of Support Ambulates 4-7 steps.    Gait with Eyes Closed Walks 20 ft, slow speed, abnormal gait pattern, evidence for imbalance, deviates 10-15 in outside 12 in walkway width. Requires more than 9 sec to ambulate 20 ft.    Ambulating Backwards Walks 20 ft, slow speed, abnormal gait pattern, evidence for imbalance, deviates 10-15 in outside 12 in walkway width.    Steps Alternating  feet, must use rail.    Total Score 17                   PAIN: Occasional tightness in hands.  POSTURE: No abnormalities noted.   PROM/AROM:   AROM BLE:  WFL  STRENGTH:  Graded on a 0-5 scale Muscle Group Left Right  Hip Flex 4/5 4+/5  Hip Abd 4/5 4+/5  Hip Add 4/5 4+/5  Hip Ext 4-/5 4-/5  Hip IR/ER 4-/5 4-/5  Knee Flex 4-/5 4-/5  Knee Ext 4/5 4-/5  Ankle DF 4/5 4/5  Ankle PF 4/5 4/5  *Patient testing limited by patient cognition   SENSATION:  BUE :  BLE :   NEUROLOGICAL SCREEN: (2+ unless otherwise noted.) N=normal  Ab=abnormal   Level Dermatome R L  C3 Anterior Neck  N N  C4 Top of Shoulder N N  C5 Lateral Upper Arm  N N  C6 Lateral Arm/ Thumb  N N  C7 Middle Finger  N N  C8 4th & 5th Finger N N  T1 Medial Arm N N  L2 Medial thigh/groin N N  L3 Lower thigh/med.knee N N  L4 Medial leg/lat thigh N N  L5 Lat. leg & dorsal foot N N  S1 post/lat foot/thigh/leg N N  S2 Post./med. thigh & leg N N    SOMATOSENSORY:  Any N & T in extremities or weakness: reports :         Sensation           Intact      Diminished         Absent  Light touch LEs  COORDINATION: Finger to Nose: Dysmetric with slight past pointing.           Heel Shin Slide Test: slight deficits bilateral    SPECIAL TESTS: Cranial Nerves/Vision : able to follow pen functionally, limited convergence  FUNCTIONAL MOBILITY: Able to perform STS functionally, slight deficits noted in safety awareness.   BALANCE: Static Sitting Balance  Normal Able to maintain balance against maximal resistance   Good Able to maintain balance against moderate resistance   Good-/Fair+ Accepts minimal resistance x  Fair Able to sit unsupported without balance loss and without UE support   Poor+ Able to maintain with Minimal assistance from individual or chair   Poor Unable to maintain balance-requires mod/max support from individual or chair    Static Standing Balance   Normal Able to maintain standing balance against maximal resistance   Good Able to maintain standing balance against moderate resistance   Good-/Fair+ Able to maintain standing balance against minimal resistance   Fair Able to stand unsupported without UE support and without LOB for 1-2 min x  Fair- Requires Min A and UE support to maintain standing without loss of balance   Poor+ Requires mod A and UE support to maintain standing without loss of balance   Poor Requires max A and UE support to maintain standing balance without loss    Dynamic Sitting Balance  Normal Able to sit unsupported and weight shift across midline maximally   Good Able to sit unsupported and weight shift across midline moderately   Good-/Fair+ Able to sit unsupported and weight shift across midline minimally   Fair Minimal weight shifting ipsilateral/front, difficulty crossing midline x  Fair- Reach to ipsilateral side and unable to weight shift   Poor + Able to sit unsupported with min A and reach to ipsilateral side, unable to weight shift   Poor Able to sit unsupported with mod A and reach ipsilateral/front-can't cross midline    Standing Dynamic Balance  Normal Stand independently unsupported, able to weight shift and cross midline maximally   Good Stand independently unsupported, able to weight shift and cross midline moderately   Good-/Fair+ Stand independently unsupported, able to weight shift across midline minimally   Fair Stand independently unsupported, weight shift, and reach ipsilaterally, loss of balance when crossing midline x  Poor+ Able to stand with Min A and reach ipsilaterally, unable to weight shift   Poor Able to stand with Mod A and minimally reach ipsilaterally, unable to cross midline.      GAIT: Ambulates without an AD, loss of arm swing with prolonged ambulation. Decreased heel strike and toe off with prolonged ambulation  OUTCOME MEASURES: TEST Outcome Interpretation  5 times  sit<>stand 16 sec arms crossed  >77 yo, >15 sec indicates increased risk for falls      FGA 17/30   6 minute walk test   1300             Feet 1000 feet is community Forensic scientist Assessment 44 <36/56 (100% risk for falls), 37-45 (80% risk for falls); 46-51 (>50% risk for falls); 52-55 (lower risk <25% of falls)  FOTO 68 Predicted discharge 75%        Objective measurements completed on examination: See above findings.       Patient is a very pleasant 55 year old male who presents to physical therapy status post CVA affecting frontal lobe. Patient has slight deficits in strength of lower extremities as well as stability. Deficits partially can be explained by  cognitive impairments and occasional challenges with following commands. Patient's daughter present to assist with history.  Request assistance with stability as well as home program for strengthening.  Patient will require PT to reduce his risk of falling and improve quality of life. As can be demonstrated in FGA and BERG scores.  Patient additionally challenged by complex tasks as well as ability to scan room. Patient will benefit from skilled physical therapy to improve strength stability coordination and safety awareness.   Access Code: [email or web address] URL: [email or web address] Date: 08/10/2021 Prepared by: Precious BardMarina Gerson Fauth  Exercises Seated Ankle Alphabet - 1 x daily - 7 x weekly - 2 sets - 10 reps - 5 hold Standing March with Counter Support - 1 x daily - 7 x weekly - 2 sets - 10 reps - 5 hold Standing Tandem Balance with Counter Support - 1 x daily - 7 x weekly - 2 sets - 1 reps - 30 hold Heel Raises with Counter Support - 1 x daily - 7 x weekly - 2 sets - 15 reps - 5 hold Sit to Stand Without Arm Support - 1 x daily - 7 x weekly - 2 sets - 10 reps - 5 hold       Upper Extremity Functional Index Score:  /80   PT Education - 08/11/21 0714     Education Details goals, POC, HEP    Person(s) Educated  Patient;Child(ren)    Methods Explanation;Demonstration;Verbal cues;Tactile cues;Handout    Comprehension Verbalized understanding;Returned demonstration;Verbal cues required;Tactile cues required              PT Short Term Goals - 08/11/21 0719       PT SHORT TERM GOAL #1   Title Patient will be independent in home exercise program to improve strength/mobility for better functional independence with ADLs.    Baseline 9/7: HEP given    Time 4    Period Weeks    Status New    Target Date 09/08/21      PT SHORT TERM GOAL #2   Title Patient (< 56 years old) will complete five times sit to stand test in < 10 seconds indicating an increased LE strength and improved balance.    Baseline 9/7: 16 seconds    Time 4    Period Weeks    Status New    Target Date 09/08/21               PT Long Term Goals - 08/11/21 0720       PT LONG TERM GOAL #1   Title Patient will increase FOTO score to equal to or greater than  75%  to demonstrate statistically significant improvement in mobility and quality of life.    Baseline 9/8: 68% with daughter asssist due to cognition    Time 8    Period Weeks    Status New    Target Date 10/06/21      PT LONG TERM GOAL #2   Title Patient will increase Berg Balance score by > 6 points (50/56) to demonstrate decreased fall risk during functional activities.    Baseline 9/7: 44/56    Time 8    Period Weeks    Status New    Target Date 10/06/21      PT LONG TERM GOAL #3   Title Patient will increase six minute walk test distance to >1500 for progression to age norm community ambulator and improve gait ability    Baseline 9/8: 1300 ft  Time 8    Period Weeks    Status New    Target Date 10/06/21      PT LONG TERM GOAL #4   Title Patient will increase Functional Gait Assessment score to >22/30 as to reduce fall risk and improve dynamic gait safety with community ambulation.    Baseline 9/7: 17/30    Time 8    Period Weeks    Status New     Target Date 10/06/21      PT LONG TERM GOAL #5   Title Patient will increase BLE gross strength to 4+/5 as to improve functional strength for independent gait, increased standing tolerance and increased ADL ability.    Baseline 9/7: see note    Time 8    Period Weeks    Status New    Target Date 10/06/21                    Plan - 08/11/21 0716     Clinical Impression Statement Patient is a very pleasant 55 year old male who presents to physical therapy status post CVA affecting frontal lobe. Patient has slight deficits in strength of lower extremities as well as stability. Deficits partially can be explained by cognitive impairments and occasional challenges with following commands. Patient's daughter present to assist with history.  Request assistance with stability as well as home program for strengthening.  Patient will require PT to reduce his risk of falling and improve quality of life. As can be demonstrated in FGA and BERG scores.  Patient additionally challenged by complex tasks as well as ability to scan room. Patient will benefit from skilled physical therapy to improve strength stability coordination and safety awareness.?    Personal Factors and Comorbidities Comorbidity 3+;Fitness;Profession;Time since onset of injury/illness/exacerbation;Transportation    Comorbidities CKD, DM stroke, HLD, HTN    Examination-Activity Limitations Bed Mobility;Caring for Others;Stairs;Squat;Reach Overhead;Locomotion Level;Lift;Toileting;Transfers    Examination-Participation Restrictions Church;Cleaning;Community Activity;Driving;Interpersonal Relationship;Laundry;Shop;Occupation;Personal Finances;Meal Prep;Volunteer;Yard Work    Conservation officer, historic buildings Evolving/Moderate complexity    Clinical Decision Making Moderate    Rehab Potential Fair    PT Frequency 2x / week    PT Duration 8 weeks    PT Treatment/Interventions ADLs/Self Care Home Management;Canalith  Repostioning;Cryotherapy;Electrical Stimulation;Moist Heat;Ultrasound;DME Instruction;Gait training;Stair training;Functional mobility training;Therapeutic activities;Patient/family education;Neuromuscular re-education;Cognitive remediation;Balance training;Therapeutic exercise;Manual techniques;Energy conservation;Dry needling;Passive range of motion;Taping;Vestibular;Visual/perceptual remediation/compensation    PT Next Visit Plan high level balance, scanning room, strengthening    PT Home Exercise Plan see above    Consulted and Agree with Plan of Care Patient;Family member/caregiver    Family Member Consulted daughter             Patient will benefit from skilled therapeutic intervention in order to improve the following deficits and impairments:  Abnormal gait, Decreased activity tolerance, Decreased balance, Decreased knowledge of precautions, Decreased endurance, Decreased cognition, Decreased coordination, Decreased safety awareness, Difficulty walking, Decreased strength, Impaired perceived functional ability, Improper body mechanics  Visit Diagnosis: Other abnormalities of gait and mobility  Muscle weakness (generalized)  Unsteadiness on feet     Problem List Patient Active Problem List   Diagnosis Date Noted   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 (HCC) 05/12/2021   Stroke (HCC) 05/07/2021   Acute ischemic left MCA stroke (HCC) 05/07/2021   Middle cerebral artery embolism, left 05/07/2021   Precious Bard, PT, DPT  08/11/2021, 7:23 AM  [facility] MAIN [facility] SERVICES [address] [place], [place], [address] Phone: [phone]  Fax:  [phone]  Name: Peter Lucas MRN: [id number] Date of Birth: 11-04-1966

## 2021-08-12 NOTE — Addendum Note (Signed)
Addended by: Reuel Derby B on: 08/12/2021 10:28 AM   Modules accepted: Orders

## 2021-08-12 NOTE — Therapy (Signed)
[facility] MAIN [facility] SERVICES [address] [place], [place], [address] Phone: [phone]   Fax:  [phone]  Speech Language Pathology Evaluation  Patient Details  Name: Peter Lucas MRN: [id number] Date of Birth: 11/29/1966 Referring Provider (SLP): Claudette Laws   Encounter Date: 08/10/2021   End of Session - 08/12/21 1015     Visit Number 1    Number of Visits 25    Date for SLP Re-Evaluation 11/02/21    Authorization Type [employer]    Authorization Time Period 08/10/2021 thru 11/02/2021    Authorization - Visit Number 1    Progress Note Due on Visit 10    SLP Start Time 1000    SLP Stop Time  1100    SLP Time Calculation (min) 60 min    Activity Tolerance Patient tolerated treatment well             Past Medical History:  Diagnosis Date   DM (diabetes mellitus) (HCC)    Hyperlipidemia    Hypertension     Past Surgical History:  Procedure Laterality Date   BACK SURGERY     cyst removal   IR CT HEAD LTD  05/07/2021   IR PERCUTANEOUS ART THROMBECTOMY/INFUSION INTRACRANIAL INC DIAG ANGIO  05/07/2021   RADIOLOGY WITH ANESTHESIA N/A 05/07/2021   Procedure: IR WITH ANESTHESIA;  Surgeon: Radiologist, Medication, MD;  Location: [facility] OR;  Service: Radiology;  Laterality: N/A;    There were no vitals filed for this visit.   Subjective Assessment - 08/12/21 1007     Subjective pt pleasant, accompanied by his daughter    Patient is accompained by: Family member    Currently in Pain? No/denies                SLP Evaluation [facility] - 08/12/21 1007       SLP Visit Information   SLP Received On 08/10/21    Referring Provider (SLP) Claudette Laws    Onset Date 05/07/2021    Medical Diagnosis L MCA CVA      Subjective   Patient/Family Stated Goal "to talk better"      General Information   HPI Peter Lucas is a 55 year old right-handed male with unremarkable past medical history. Presented 05/07/2021 with  acute onset of aphasia and right side weakness. Large, confluent acute posterior left MCA territory infarct with smaller acute infarcts scattered throughout the left frontotemporal lobes and left insular region. Associated edema and regional mass effect without midline shift. Areas of petechial hemorrhage along the posteromedial aspect of the confluent hemorrhage. Patient underwent thrombectomy per interventional radiology.  Latest cranial CT scan showed more pronounced low density within the left MCA branch vessel infarction. CIR 05/13/2021 thru 05/26/2021.    Behavioral/Cognition appropriate    Mobility Status ambulatory      Balance Screen   Has the patient fallen in the past 6 months No    Has the patient had a decrease in activity level because of a fear of falling?  No    Is the patient reluctant to leave their home because of a fear of falling?  No      Prior Functional Status   Cognitive/Linguistic Baseline Within functional limits    Type of Home House     Lives With Alone    Available Support Family    Vocation Full time employment      Cognition   Overall Cognitive Status Difficult to assess   d/t severity  of language impairments   Difficult to assess due to Impaired communication    Attention Selective    Selective Attention Appears intact    Memory Appears intact    Awareness Impaired    Awareness Impairment Emergent impairment    Problem Solving Appears intact      Auditory Comprehension   Overall Auditory Comprehension Impaired    Yes/No Questions Impaired    Basic Biographical Questions 0-25% accurate    Basic Immediate Environment Questions 0-24% accurate    Commands Impaired    One Step Basic Commands 0-24% accurate    Conversation Simple    EffectiveTechniques Increased volume;Repetition;Slowed speech;Stressing words;Visual/Gestural cues      Visual Recognition/Discrimination   Discrimination Not tested      Reading Comprehension   Reading Status Impaired     Word level 76-100% accurate    Sentence Level 0-25% accurate    Paragraph Level Not tested    Functional Environmental (signs, name badge) Within functional limits    Interfering Components Right neglect/inattention      Expression   Primary Mode of Expression Verbal      Verbal Expression   Overall Verbal Expression Impaired    Initiation No impairment    Automatic Speech Name;Social Response    Level of Generative/Spontaneous Verbalization Phrase    Repetition Impaired    Level of Impairment Word level    Naming Impairment    Responsive 0-25% accurate    Confrontation 0-24% accurate    Convergent 0-24% accurate    Divergent 0-24% accurate    Verbal Errors Semantic paraphasias;Phonemic paraphasias;Neologisms;Not aware of errors;Consistent;Inconsistent    Pragmatics No impairment    Non-Verbal Means of Communication Not applicable      Written Expression   Dominant Hand Right    Written Expression Exceptions to [facility]    Dictation Ability Letter    Self Formulation Ability Letter      Oral Motor/Sensory Function   Overall Oral Motor/Sensory Function Appears within functional limits for tasks assessed      Motor Speech   Overall Motor Speech Impaired    Respiration Within functional limits    Phonation Normal    Resonance Within functional limits    Articulation Impaired    Level of Impairment Word    Intelligibility Intelligibility reduced    Motor Planning Impaired    Level of Impairment Word    Motor Speech Errors Unaware;Groping for words;Consistent    Phonation WFL      Standardized Assessments   Standardized Assessments  Western Aphasia Battery    Western Aphasia Battery  Aphasia Quotient -- 10 - very severe                 SLP Education - 08/12/21 1015     Education Details ST POC    Person(s) Educated Patient;Child(ren)    Methods Explanation;Demonstration;Verbal cues    Comprehension Need further instruction              SLP Short Term  Goals - 08/12/21 1018       SLP SHORT TERM GOAL #1   Title With moderate multi-modal cues, pt will communicate orientation information in 8 out of 10 opportunities.    Baseline unable at this time    Time 10    Period --   sessions   Status New      SLP SHORT TERM GOAL #2   Title With moderate multimodal assistance, pt will imitate 2 syllables containing bilabials with  80% accuracy in 8 out of 10 sessions.    Baseline unable at this time    Time 10    Period --   sessions   Status New      SLP SHORT TERM GOAL #3   Title With moderate multimodal cues, pt will follow 1-step directions with 80% accuracy in 8 out of 10 sessions.    Baseline unable at this time    Time 10    Period --   sessions   Status New      SLP SHORT TERM GOAL #4   Title Pt will scan to the right of midline to locate objects in 80% of opportunities across 5 sessions.    Baseline new goal    Time 10    Period --   sessions   Status New              SLP Long Term Goals - 08/12/21 1024       SLP LONG TERM GOAL #1   Title Pt will use multi-modal communication to communitcate basic wants and needs.    Baseline severe deficits    Time 12    Period Weeks    Status New    Target Date 11/02/21              Plan - 08/12/21 1016     Clinical Impression Statement Pt presents with severe aphasia impacting all aspects of language.       Pt's expressive language is c/b by fluent phonemic, semantic and neologistic paraphasias. These paraphasias results in < 25% accuracy when naming common objects, counting to 10, saying DOW, MOY and basic wants/needs. Pt is frequently unaware of these errors. Apraxia of speech cannot be ruled out as pt is unable to imitate/repeat.        Pt's receptive language deficits are severe with inability to follow 1 step directions, answer yes/no questions, and unable to follow conversation. Pt is aware of inability.       Pt's reading comprehension is limited to the word level  as he was able to select printed word in field of 3 given picture. He is unable to identify letters/numbers, MOY, DOY. As such, pt is also not able to generate written language for communication.    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 Dreama Saa M.S., [name], [facility] Speech-Language Pathologist Rehabilitation Services Office [phone]  Reuel Derby 08/12/2021, 10:26 AM  [facility] MAIN [facility] SERVICES [address] [place], [place], [address] Phone: [phone]   Fax:  [phone]  Name: ANDREA COLGLAZIER MRN: [id number] Date of Birth: 15-May-1966

## 2021-08-16 ENCOUNTER — Other Ambulatory Visit: Payer: Self-pay

## 2021-08-16 NOTE — Patient Outreach (Signed)
[facility]) Care Management  08/16/2021  Peter Lucas 04/22/1966 [id number]   First telephone outreach attempt to obtain mRS. No answer. Left message for returned call.  Vanice [facility] Management Assistant [phone]

## 2021-08-17 ENCOUNTER — Ambulatory Visit: Payer: BC Managed Care – PPO

## 2021-08-17 ENCOUNTER — Ambulatory Visit: Payer: BC Managed Care – PPO | Admitting: Speech Pathology

## 2021-08-17 ENCOUNTER — Ambulatory Visit: Payer: BC Managed Care – PPO | Admitting: Physical Therapy

## 2021-08-17 ENCOUNTER — Other Ambulatory Visit: Payer: Self-pay

## 2021-08-17 ENCOUNTER — Encounter: Payer: Self-pay | Admitting: Physical Therapy

## 2021-08-17 DIAGNOSIS — I639 Cerebral infarction, unspecified: Secondary | ICD-10-CM

## 2021-08-17 DIAGNOSIS — M6281 Muscle weakness (generalized): Secondary | ICD-10-CM

## 2021-08-17 DIAGNOSIS — I63312 Cerebral infarction due to thrombosis of left middle cerebral artery: Secondary | ICD-10-CM

## 2021-08-17 DIAGNOSIS — R278 Other lack of coordination: Secondary | ICD-10-CM

## 2021-08-17 DIAGNOSIS — I63512 Cerebral infarction due to unspecified occlusion or stenosis of left middle cerebral artery: Secondary | ICD-10-CM

## 2021-08-17 DIAGNOSIS — R2681 Unsteadiness on feet: Secondary | ICD-10-CM

## 2021-08-17 DIAGNOSIS — R4701 Aphasia: Secondary | ICD-10-CM

## 2021-08-17 DIAGNOSIS — R2689 Other abnormalities of gait and mobility: Secondary | ICD-10-CM

## 2021-08-17 NOTE — Therapy (Signed)
[facility] MAIN [facility] SERVICES [address] [place], [place], [address] Phone: [phone]   Fax:  [phone]  Occupational Therapy Treatment  Patient Details  Name: Peter Lucas MRN: [id number] Date of Birth: 03/30/1966 Referring Provider (OT): Dr. Claudette Laws   Encounter Date: 08/17/2021   OT End of Session - 08/17/21 1037     Visit Number 2    Number of Visits 16    Date for OT Re-Evaluation 10/04/21    Authorization Time Period Reporting period beginning 08/10/2021    OT Start Time 0935    OT Stop Time 1015    OT Time Calculation (min) 40 min    Activity Tolerance Patient tolerated treatment well    Behavior During Therapy [facility] for tasks assessed/performed             Past Medical History:  Diagnosis Date   DM (diabetes mellitus) (HCC)    Hyperlipidemia    Hypertension     Past Surgical History:  Procedure Laterality Date   BACK SURGERY     cyst removal   IR CT HEAD LTD  05/07/2021   IR PERCUTANEOUS ART THROMBECTOMY/INFUSION INTRACRANIAL INC DIAG ANGIO  05/07/2021   RADIOLOGY WITH ANESTHESIA N/A 05/07/2021   Procedure: IR WITH ANESTHESIA;  Surgeon: Radiologist, Medication, MD;  [facility]: [facility] OR;  Service: Radiology;  Laterality: N/A;    There were no vitals filed for this visit.   Subjective Assessment - 08/17/21 1036     Subjective  "I haven't tried the stove yet."    Patient is accompanied by: Family member    Pertinent History L MCA, aphasia, apraxia, decreased balance, R sided weaknes    Limitations aphasia    Patient Stated Goals "To cook and pay bills."    Currently in Pain? No/denies    Pain Score 0-No pain    Multiple Pain Sites No           Occupational Therapy Treatment: Self Care: OT made list of 3 familiar stove top activities pt could choose from to complete at home with caregiver supv, including grilled cheese, scrambled eggs, and/or bacon sausage.  Explained to pt and sister the goal would be  for pt to gather ingredients, set the stove top dial correctly to the corresponding burner, remember to and accurately turn off stove top dial as independently as possible, cues from family only as needed. Pt/sister in agreement/acknowledged understanding.  Also provided list for pt's daughter [name] to identify which bills pt has, how they are paid (computer, phone), and any they would want to set up for auto pay so that pt may practice bill paying during OT sessions as needed.  Practiced Librarian, academic with upper/lower case alphabet, signing and printing name.  Pt able to construct letters more easily today when copying from OT letters; language deficits prevented pt from writing letters from the alphabet if they were not in front of him to copy.   Response to Treatment: See Plan/clinical impression below.    OT Education - 08/17/21 1037     Education Details stove top homework with family supv    Person(s) Educated Patient;Child(ren)    Methods Explanation;Verbal cues    Comprehension Verbalized understanding;Verbal cues required;Need further instruction              OT Short Term Goals - 08/10/21 1016       OT SHORT TERM GOAL #1   Title Pt will independently perform HEP for improving R  hand strength and coordination.    Baseline Eval: Issued blue putty at eval; further instruction needed    Time 4    Period Weeks    Status New    Target Date 09/06/21               OT Long Term Goals - 08/10/21 1017       OT LONG TERM GOAL #1   Title Pt will improve FOTO score to 97 or better to indicate increased functional performance.    Baseline Eval: FOTO: 91    Time 8    Period Weeks    Status New    Target Date 10/04/21      OT LONG TERM GOAL #2   Title Pt will improve R grip strength by 10 or more lbs to enable gripping/carrying heavy objects/grocery bags with R dominant hand.    Baseline Eval: R (dominant) grip 75 lbs, L grip 86 lbs    Time 8    Period Weeks     Status New    Target Date 10/04/21      OT LONG TERM GOAL #3   Title Pt will be indep to perform hot meal prep using stove top.    Baseline Eval: pt has not yet attempted stove top at home but can operate his microwave.    Time 8    Period Weeks    Status New    Target Date 10/04/21      OT LONG TERM GOAL #4   Title Pt will be independent to manage household bills.    Baseline Eval: Daughter currently manages all of pt's bills.    Time 8    Period Weeks    Status New    Target Date 10/04/21      OT LONG TERM GOAL #5   Title Pt will be independent to construct letters/print to enable making lists, writing letters, filling out medical forms.    Baseline Eval: Extra time to construct letters to print name    Time 8    Period Weeks    Status New    Target Date 10/04/21                   Plan - 08/17/21 1115     Clinical Impression Statement Pt present today with sister, Minor.  Improved letter construction this day, but struggles to create a remaining sequence in the alphabet if the letters are not visible to copy.  Pt was able to successfully print and sign his name with extra time without copying.  Provided homework for stove top kitchen task with instructions for caregivers.  Will also plan to observe stove top use in ADL kitchen in the clinic during a future therapy visit to assess functional status and safety.  Additional caregiver education needed for realistic ADL/IADL completion and education for compensatory strategies.    OT Occupational Profile and History Detailed Assessment- Review of Records and additional review of physical, cognitive, psychosocial history related to current functional performance    Occupational performance deficits (Please refer to evaluation for details): IADL's;Leisure;Work    Games developer / Function / Physical Skills Dexterity;Strength;Coordination;FMC;UE functional use;GMC;IADL    Cognitive Skills Understand;Problem Solve;Safety Awareness     Rehab Potential Good    Clinical Decision Making Several treatment options, min-mod task modification necessary    Comorbidities Affecting Occupational Performance: May have comorbidities impacting occupational performance    Modification or Assistance to Complete Evaluation  Min-Moderate modification  of tasks or assist with assess necessary to complete eval    OT Frequency 2x / week    OT Duration 8 weeks    OT Treatment/Interventions Therapeutic exercise;Cognitive remediation/compensation;Neuromuscular education;Therapeutic activities;Patient/family education;Self-care/ADL training;Balance training    Consulted and Agree with Plan of Care Patient;Family member/caregiver    Family Member Consulted sister, Geraldine Contras             Patient will benefit from skilled therapeutic intervention in order to improve the following deficits and impairments:   Body Structure / Function / Physical Skills: Dexterity, Strength, Coordination, FMC, UE functional use, GMC, IADL Cognitive Skills: Understand, Problem Solve, Safety Awareness     Visit Diagnosis: Muscle weakness (generalized)  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

## 2021-08-17 NOTE — Therapy (Signed)
[facility] MAIN [facility] SERVICES [address] [place], [place], [address] Phone: [phone]   Fax:  [phone]  Physical Therapy Treatment  Patient Details  Name: Peter Lucas MRN: [id number] Date of Birth: Oct 29, 1966 Referring Provider (PT): Erick Colace   Encounter Date: 08/17/2021   PT End of Session - 08/17/21 0916     Visit Number 2    Number of Visits 16    Date for PT Re-Evaluation 10/06/21    Authorization Type 1/10 eval 08/10/21    PT Start Time 0803    PT Stop Time 0845    PT Time Calculation (min) 42 min    Equipment Utilized During Treatment Gait belt    Activity Tolerance Patient tolerated treatment well;No increased pain    Behavior During Therapy WFL for tasks assessed/performed             Past Medical History:  Diagnosis Date   DM (diabetes mellitus) (HCC)    Hyperlipidemia    Hypertension     Past Surgical History:  Procedure Laterality Date   BACK SURGERY     cyst removal   IR CT HEAD LTD  05/07/2021   IR PERCUTANEOUS ART THROMBECTOMY/INFUSION INTRACRANIAL INC DIAG ANGIO  05/07/2021   RADIOLOGY WITH ANESTHESIA N/A 05/07/2021   Procedure: IR WITH ANESTHESIA;  Surgeon: Radiologist, Medication, MD;  Location: [facility] OR;  Service: Radiology;  Laterality: N/A;    There were no vitals filed for this visit.   Subjective Assessment - 08/17/21 0810     Subjective Patient reports doing well; no pain today; Reports HEP is going well; He is still having trouble with communication;    Patient is accompained by: Family member   Sister Geraldine Contras   Pertinent History Patient is a 55 y.o male who presents to physical therapy s/p ischemic CVA of frontal lobe.  Patient was Independent prior to CVA working at [employer].  He has good family support with multiple family members in the area. PMH includes CKD, DM stroke, HLD, HTN.  Presented 05/07/2021 with acute onset of aphasia and right side weakness.  Cranial CT scan showed posterior  left MCA infarction with fairly well-developed cytotoxic edema.  No hemorrhagic transformation.  Patient did not receive tPA.  CTA of head and neck positive for occlusion of the posterior left MCA branch, distal M2 versus proximal M3.  Patient underwent thrombectomy per interventional radiology.  Latest cranial CT scan showed more pronounced low density within the left MCA branch vessel infarction.  No midline shift or hemorrhage.  Echocardiogram ejection fraction of 55 to 60% no wall motion abnormalities  Patient did require initial intubation after thrombectomy was ultimately extubated but became hypoxic requiring reintubation with final extubation 05/09/2021.  He did spike a low-grade fever respiratory cultures. Therapy evaluations completed due to patient's decreased functional mobility and aphasia was admitted for a comprehensive rehab program.  Patient transferred to CIR on 05/12/2021 . He was discharged 05/26/[facility] from [facility] and underwent Home Health therapy for OT and SLP which ended the week of August 8th.  Patient is able to mow, walk, and perform ADLs.  His balance is challenged per daughter report as well has his ability to negotiate stairs.  Has an eye physician appointment on September 27    Limitations Lifting;Standing;Walking;House hold activities    How long can you sit comfortably? n/a    How long can you stand comfortably? no limitations    How long can you walk comfortably?  store    Patient Stated Goals Daughter reports goals are to improve strength balance and to give home program for strengthening    Currently in Pain? No/denies                    TREATMENT: PT instructed patient in LE strengthening: Standing with green tband around BLE: -hip flexion SLR x10 reps -hip extension SLR x10 reps -Hip abduction SLR x10 reps Side stepping x10 feet x2 laps each direction Patient required min-mod VCs for correct exercise technique including to keep knee straight, improve erect posture  for better muscle activation Patient reports minimal difficulty with exercise;  Instructed patient in advanced balance tasks: Airex:  Feet apart, BLE heel raises 3 sec hold x10 reps unsupported;   Alternate toe taps to 10 in box x10 reps each unsupported, CGA for safety  Standing one foot on airex, one foot on 10 inch box:   Ball pass BUE x5 reps each direction; Required mod VCs for sequencing and to utilize both hands to challenge reaching outside base of support  Standing on 1/2 bolster (flat side up) Feet apart:  Heel/toe rock x10 reps  Neutral stance BUE wand flexion x10 reps, CGA for safety  Mini squat holding ball to challenge balance/quad control x10 reps, min A required;   Standing on airex beam:  Feet apart, reaching forward diagonal tapping post it on wall to challenge reaching outside base of support x10 reps each direction with close supervision Moved airex beam outside parallel bars:  Tandem stance unsupported 10 sec hold x3 reps each foot in front with CGA for safety and mod VCs for proper positioning  Negotiating airex beam with tetris type movement forward, side step, backward , side step x2 laps close supervision; patient able to progress requiring less verbal cues with better sequencing and motor control;   Patient tolerated session well. He did have difficulty with standing on compliant surface especially with reduced rail assist. He also had trouble with dual task requiring cues for sequencing and proper exercise technique. Patient does require demonstration as he has difficulty following verbal cues;                  PT Education - 08/17/21 0915     Education Details LE strengthening, balance;    Person(s) Educated Patient    Methods Explanation;Verbal cues    Comprehension Verbalized understanding;Returned demonstration;Verbal cues required;Need further instruction              PT Short Term Goals - 08/11/21 0719       PT SHORT TERM GOAL #1    Title Patient will be independent in home exercise program to improve strength/mobility for better functional independence with ADLs.    Baseline 9/7: HEP given    Time 4    Period Weeks    Status New    Target Date 09/08/21      PT SHORT TERM GOAL #2   Title Patient (< 1 years old) will complete five times sit to stand test in < 10 seconds indicating an increased LE strength and improved balance.    Baseline 9/7: 16 seconds    Time 4    Period Weeks    Status New    Target Date 09/08/21               PT Long Term Goals - 08/11/21 0720       PT LONG TERM GOAL #1   Title Patient  will increase FOTO score to equal to or greater than  75%  to demonstrate statistically significant improvement in mobility and quality of life.    Baseline 9/8: 68% with daughter asssist due to cognition    Time 8    Period Weeks    Status New    Target Date 10/06/21      PT LONG TERM GOAL #2   Title Patient will increase Berg Balance score by > 6 points (50/56) to demonstrate decreased fall risk during functional activities.    Baseline 9/7: 44/56    Time 8    Period Weeks    Status New    Target Date 10/06/21      PT LONG TERM GOAL #3   Title Patient will increase six minute walk test distance to >1500 for progression to age norm community ambulator and improve gait ability    Baseline 9/8: 1300 ft    Time 8    Period Weeks    Status New    Target Date 10/06/21      PT LONG TERM GOAL #4   Title Patient will increase Functional Gait Assessment score to >22/30 as to reduce fall risk and improve dynamic gait safety with community ambulation.    Baseline 9/7: 17/30    Time 8    Period Weeks    Status New    Target Date 10/06/21      PT LONG TERM GOAL #5   Title Patient will increase BLE gross strength to 4+/5 as to improve functional strength for independent gait, increased standing tolerance and increased ADL ability.    Baseline 9/7: see note    Time 8    Period Weeks     Status New    Target Date 10/06/21                   Plan - 08/17/21 0911     Clinical Impression Statement Patient motivated and participated well within session. Instructed patient in BLE strengthening utilizing green tband for resistance. Patient tolerated well reporting minimal difficulty. Patient instructed in advanced balance tasks utilizing compliant surface to challenge stance control. He did have difficulty with reduced rail assist. Patient required CGA to min A for safety. He did have some difficulty with dual tasks requiring demonstrationg and increased cues for sequencing and motor control. Patient would benefit from additional skilled PT intervention to improve strength, balance and mobility;    Personal Factors and Comorbidities Comorbidity 3+;Fitness;Profession;Time since onset of injury/illness/exacerbation;Transportation    Comorbidities CKD, DM stroke, HLD, HTN    Examination-Activity Limitations Bed Mobility;Caring for Others;Stairs;Squat;Reach Overhead;Locomotion Level;Lift;Toileting;Transfers    Examination-Participation Restrictions Church;Cleaning;Community Activity;Driving;Interpersonal Relationship;Laundry;Shop;Occupation;Personal Finances;Meal Prep;Volunteer;Yard Work    Conservation officer, historic buildings Evolving/Moderate complexity    Rehab Potential Fair    PT Frequency 2x / week    PT Duration 8 weeks    PT Treatment/Interventions ADLs/Self Care Home Management;Canalith Repostioning;Cryotherapy;Electrical Stimulation;Moist Heat;Ultrasound;DME Instruction;Gait training;Stair training;Functional mobility training;Therapeutic activities;Patient/family education;Neuromuscular re-education;Cognitive remediation;Balance training;Therapeutic exercise;Manual techniques;Energy conservation;Dry needling;Passive range of motion;Taping;Vestibular;Visual/perceptual remediation/compensation    PT Next Visit Plan high level balance, scanning room, strengthening    PT Home  Exercise Plan see above    Consulted and Agree with Plan of Care Patient;Family member/caregiver    Family Member Consulted daughter             Patient will benefit from skilled therapeutic intervention in order to improve the following deficits and impairments:  Abnormal gait, Decreased activity tolerance, Decreased balance, Decreased  knowledge of precautions, Decreased endurance, Decreased cognition, Decreased coordination, Decreased safety awareness, Difficulty walking, Decreased strength, Impaired perceived functional ability, Improper body mechanics  Visit Diagnosis: Other abnormalities of gait and mobility  Muscle weakness (generalized)  Unsteadiness on feet     Problem List Patient Active Problem List   Diagnosis Date Noted   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 (HCC) 05/12/2021   Stroke (HCC) 05/07/2021   Acute ischemic left MCA stroke (HCC) 05/07/2021   Middle cerebral artery embolism, left 05/07/2021    Zian Mohamed, PT , DPT 08/17/2021, 9:16 AM  [facility] MAIN [facility] SERVICES [address] [place], [place], [address] Phone: [phone]   Fax:  [phone]  Name: Peter Lucas MRN: [id number] Date of Birth: 05-08-1966

## 2021-08-18 NOTE — Patient Instructions (Signed)
Complete Talk[employer] Therapy apps in 3 given categories

## 2021-08-18 NOTE — Therapy (Signed)
[place] [facility] MAIN [facility] SERVICES [address] [place], [place], [address] Phone: [phone]   Fax:  [phone]  Speech Language Pathology Treatment  Patient Details  Name: Peter Lucas MRN: [id number] Date of Birth: [birth year] Referring Provider (SLP): Claudette Laws   Encounter Date: 08/17/2021   End of Session - 08/18/21 0909     Visit Number 2    Number of Visits 25    Date for SLP Re-Evaluation 11/02/21    Authorization Type [employer]    Authorization Time Period 08/10/2021 thru 11/02/2021    Authorization - Visit Number 2    Authorization - Number of Visits 32    Progress Note Due on Visit 10    SLP Start Time 0845    SLP Stop Time  0930    SLP Time Calculation (min) 45 min    Activity Tolerance Patient tolerated treatment well             Past Medical History:  Diagnosis Date   DM (diabetes mellitus) (HCC)    Hyperlipidemia    Hypertension     Past Surgical History:  Procedure Laterality Date   BACK SURGERY     cyst removal   IR CT HEAD LTD  05/07/2021   IR PERCUTANEOUS ART THROMBECTOMY/INFUSION INTRACRANIAL INC DIAG ANGIO  05/07/2021   RADIOLOGY WITH ANESTHESIA N/A 05/07/2021   Procedure: IR WITH ANESTHESIA;  Surgeon: Radiologist, Medication, MD;  Location: [facility] OR;  Service: Radiology;  Laterality: N/A;    There were no vitals filed for this visit.   Subjective Assessment - 08/18/21 0903     Subjective pt pleasant, accompanied by his sister    Patient is accompained by: Family member    Currently in Pain? No/denies                   ADULT SLP TREATMENT - 08/18/21 0001       Treatment Provided   Treatment provided Cognitive-Linquistic      Cognitive-Linquistic Treatment   Treatment focused on Aphasia;Patient/family/caregiver education    Skilled Treatment WORD REPETITION: 8 of 20, improving to 20 of 20 with maximal verbal and visual cues for articulatory placement; NAME CATEGORY: 10 of  10; WORD/PHRASE DESCRIPTION OF PICTURE - selecting from filed of two written choices; 100%;              SLP Education - 08/18/21 0908     Education Details TalkPath Therapy website, account created    Person(s) Educated Patient;Other (comment)   sister, Peter Lucas   Methods Explanation;Demonstration;Verbal cues    Comprehension Need further instruction              SLP Short Term Goals - 08/12/21 1018       SLP SHORT TERM GOAL #1   Title With moderate multi-modal cues, pt will communicate orientation information in 8 out of 10 opportunities.    Baseline unable at this time    Time 10    Period --   sessions   Status New      SLP SHORT TERM GOAL #2   Title With moderate multimodal assistance, pt will imitate 2 syllables containing bilabials with 80% accuracy in 8 out of 10 sessions.    Baseline unable at this time    Time 10    Period --   sessions   Status New      SLP SHORT TERM GOAL #3   Title With moderate multimodal cues, pt will  follow 1-step directions with 80% accuracy in 8 out of 10 sessions.    Baseline unable at this time    Time 10    Period --   sessions   Status New      SLP SHORT TERM GOAL #4   Title Pt will scan to the right of midline to locate objects in 80% of opportunities across 5 sessions.    Baseline new goal    Time 10    Period --   sessions   Status New              SLP Long Term Goals - 08/12/21 1024       SLP LONG TERM GOAL #1   Title Pt will use multi-modal communication to communitcate basic wants and needs.    Baseline severe deficits    Time 12    Period Weeks    Status New    Target Date 11/02/21              Plan - 08/18/21 0910     Clinical Impression Statement Introduced TalkPath Therapy website, account created and used during session. Pt is excited about using website for daily practice. Continued skilled ST intervention is required as pt demonstrates severe global aphasia impacting his ability to  communicate and understand what is said to him.    Speech Therapy Frequency 2x / week    Duration 12 weeks    Treatment/Interventions Language facilitation;Cueing hierarchy;Multimodal communcation approach;Compensatory strategies;SLP instruction and feedback;Functional tasks;Patient/family education    Potential to Achieve Goals Good    SLP Home Exercise Plan provided, see pt instructions section    Consulted and Agree with Plan of Care Patient;Family member/caregiver    Family Member Consulted sister, Peter Lucas             Patient will benefit from skilled therapeutic intervention in order to improve the following deficits and impairments:   Aphasia  Acute ischemic left MCA stroke [facility])  Cerebrovascular accident (CVA) due to thrombosis of left middle cerebral artery [facility])    Problem List Patient Active Problem List   Diagnosis Date Noted   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 (HCC) 05/12/2021   Stroke (HCC) 05/07/2021   Acute ischemic left MCA stroke (HCC) 05/07/2021   Middle cerebral artery embolism, 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 [place], [place], [address] Phone: [phone]   Fax:  [phone]   Name: Peter Lucas MRN: [id number] Date of Birth: Jan 08, 1966

## 2021-08-19 ENCOUNTER — Ambulatory Visit: Payer: BC Managed Care – PPO | Admitting: Speech Pathology

## 2021-08-19 ENCOUNTER — Other Ambulatory Visit: Payer: Self-pay

## 2021-08-19 ENCOUNTER — Ambulatory Visit: Payer: BC Managed Care – PPO

## 2021-08-19 DIAGNOSIS — I63512 Cerebral infarction due to unspecified occlusion or stenosis of left middle cerebral artery: Secondary | ICD-10-CM

## 2021-08-19 DIAGNOSIS — M6281 Muscle weakness (generalized): Secondary | ICD-10-CM

## 2021-08-19 DIAGNOSIS — R278 Other lack of coordination: Secondary | ICD-10-CM

## 2021-08-19 DIAGNOSIS — I639 Cerebral infarction, unspecified: Secondary | ICD-10-CM

## 2021-08-19 DIAGNOSIS — R4701 Aphasia: Secondary | ICD-10-CM

## 2021-08-19 DIAGNOSIS — I63312 Cerebral infarction due to thrombosis of left middle cerebral artery: Secondary | ICD-10-CM

## 2021-08-19 DIAGNOSIS — R482 Apraxia: Secondary | ICD-10-CM

## 2021-08-19 NOTE — Patient Outreach (Signed)
[facility]) Care Management  08/19/2021  UNKNOWN SCHLEYER [birth year] [phone]   Telephone outreach to patient's daughter to obtain mRS was successfully completed. MRS= 2    Vanice Sarah [facility] Care Management Assistant

## 2021-08-19 NOTE — Therapy (Signed)
[place] MAIN [facility] SERVICES [address] [place], [place], [address] Phone: [phone]   Fax:  [phone]  Occupational Therapy Treatment/Discharge Visit  Patient Details  Name: Peter Lucas MRN: [id number] Date of Birth: [birth year] Referring Provider (OT): Dr. Alysia Penna   Encounter Date: 08/19/2021   OT End of Session - 08/19/21 0907     Visit Number 3    Number of Visits 16    Date for OT Re-Evaluation 10/04/21    Authorization Time Period Reporting period beginning 08/10/2021    OT Start Time 0806    OT Stop Time 0852    OT Time Calculation (min) 46 min    Activity Tolerance Patient tolerated treatment well    Behavior During Therapy [facility] for tasks assessed/performed             Past Medical History:  Diagnosis Date   DM (diabetes mellitus) ([place])    Hyperlipidemia    Hypertension     Past Surgical History:  Procedure Laterality Date   BACK SURGERY     cyst removal   IR CT HEAD LTD  05/07/2021   IR PERCUTANEOUS ART THROMBECTOMY/INFUSION INTRACRANIAL INC DIAG ANGIO  05/07/2021   RADIOLOGY WITH ANESTHESIA N/A 05/07/2021   Procedure: IR WITH ANESTHESIA;  Surgeon: Radiologist, Medication, MD;  Location: [place];  Service: Radiology;  Laterality: N/A;    There were no vitals filed for this visit.   Subjective Assessment - 08/19/21 0902     Subjective  Daughter reports pt did well socializing and talking with everyone at a family funeral last week.    Patient is accompanied by: Family member    Pertinent History L MCA, aphasia, apraxia, decreased balance, R sided weaknes    Limitations aphasia    Patient Stated Goals "To cook and pay bills."    Currently in Pain? No/denies    Pain Score 0-No pain    Multiple Pain Sites No                [facility] OT Assessment - 08/19/21 0001       Assessment   Medical Diagnosis CVA    Onset Date/Surgical Date 05/07/21    Hand Dominance Right    Next MD Visit Sept 19th with  PCP    Prior Therapy CIR      Observation/Other Assessments   Focus on Therapeutic Outcomes (FOTO)  85      Hand Function   Right Hand Grip (lbs) 96   improved by 21 lbs   Left Hand Grip (lbs) 96            Occupational Therapy Treatment/Discharge Visit: Self Care: Daughter [name] present for session today.  OT reviewed HEP for R hand strengthening for self care, IADLs.  Pt indep and compliant with exercises.  Provided education on necessary supv needed at this time for all kitchen tasks as language deficits are likely to limit correct setting of stove top dials or being able to communicate an emergency.  Encouraged pt participate in cooking tasks but a caregiver must be present throughout kitchen activities.  Reviewed other compensatory strategies for self care, including auto set up for bill pay, and daughter to continue to manage bills while pt's language skills are is still limited.  Made recommendation for life alert to enable emergency help, as pt would struggle to call for emergency help or explain an emergent situation over the phone.  Daughter receptive, but states that pt declines  this, further indicating need for 24 hr supv at this time.  OT did encourage [name] to try face time calls with pt as a way to better communicate when pt has a question or concern, face time may enable pt to show [name] the problem when pt is unable to verbalize the problem while son is present with pt in the home but working.  [name] receptive to face time trials at home.  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  Made recommendation for pt to continue to participate in all aspects of self care, and it will continue to be necessary to have family supervise IADL tasks which are limited by language, and ultimately 24 hr supv for managing emergency situations.  Daughter verbalized understanding of all recommendations.     Response to Tx: See Plan/clinical impression below.    OT Education - 08/19/21 0906     Education Details Supv needed in kitchen; 24 hr supv recommended    Person(s) Educated Patient;Child(ren)    Methods Explanation;Verbal cues    Comprehension Verbalized understanding;Verbal cues required              OT Short Term Goals - 08/19/21 0909       OT SHORT TERM GOAL #1   Title Pt will independently perform HEP for improving R hand strength and coordination.    Baseline Eval: Issued blue putty at eval; further instruction needed; 08/19/2021: indep with HEP    Time 4    Period Weeks    Status Achieved    Target Date 09/06/21               OT Long Term Goals - 08/19/21 0909       OT LONG TERM GOAL #1   Title Pt will improve FOTO score to 97 or better to indicate increased functional performance.    Baseline Eval: FOTO: 91; 08/19/2021: FOTO 85 (functional tasks limited by language deficits alone)    Time 8    Period Weeks    Status Not Met    Target Date 10/04/21      OT LONG TERM GOAL #2   Title Pt will improve R grip strength by 10 or more lbs to enable gripping/carrying heavy objects/grocery bags with R dominant hand.    Baseline Eval: R (dominant) grip 75 lbs, L grip 86 lbs; 08/19/2021: R 96 lbs (improved by 21 lbs), L 96 lbs    Time 8    Period Weeks    Status Achieved    Target Date 10/04/21      OT LONG TERM GOAL #3   Title Pt will be indep to perform hot meal prep using stove top.    Baseline Eval: pt has not yet attempted stove top at home but can operate his microwave. 08/19/2021: constant supv necessary for use of kitchen appliances d/t language deficits (may have difficulty setting stove top dials correctly, or difficulty communicating an emergency)    Time 8    Period Weeks    Status Partially Met    Target Date 10/04/21      OT LONG TERM GOAL #4   Title Pt will be independent to manage household bills.    Baseline Eval: Daughter currently manages  all of pt's bills; 08/19/2021: recommendations made for daughter to set up auto bill pay to compensate for language deficits.  Daughter will continue to pay bills  to ensure accuracy.    Time 8    Period Weeks    Status Not Met    Target Date 10/04/21      OT LONG TERM GOAL #5   Title Pt will be independent to construct letters/print to enable making lists, writing letters, filling out medical forms.    Baseline Eval: Extra time to construct letters to print name; 08/19/2021: pt can print and sign name without difficulty, letter formation is improving; SLP will continue to address the cognitive/language aspects of written communication.    Time 8    Period Weeks    Status Partially Met    Target Date 10/04/21                   Plan - 08/19/21 1028     Clinical Impression Statement Pt appropriate for OT d/c at this time as all IADL tasks are limited solely by language deficits that SLP is addressing.  IADL compensatory strategies have been reviewed along with emergency management recommendations.  Pt/daughter in agreement with OT d/c plan.    OT Occupational Profile and History Detailed Assessment- Review of Records and additional review of physical, cognitive, psychosocial history related to current functional performance    Occupational performance deficits (Please refer to evaluation for details): IADL's;Leisure;Work    Marketing executive / Function / Physical Skills Dexterity;Strength;Coordination;FMC;UE functional use;GMC;IADL    Cognitive Skills Understand;Problem Solve;Safety Awareness    Rehab Potential Good    Clinical Decision Making Several treatment options, min-mod task modification necessary    Comorbidities Affecting Occupational Performance: May have comorbidities impacting occupational performance    Modification or Assistance to Complete Evaluation  Min-Moderate modification of tasks or assist with assess necessary to complete eval    OT Frequency 2x / week    OT  Duration 8 weeks    OT Treatment/Interventions Therapeutic exercise;Cognitive remediation/compensation;Neuromuscular education;Therapeutic activities;Patient/family education;Self-care/ADL training;Balance training    Consulted and Agree with Plan of Care Patient;Family member/caregiver    Family Member Consulted dtr, [name]             Patient will benefit from skilled therapeutic intervention in order to improve the following deficits and impairments:   Body Structure / Function / Physical Skills: Dexterity, Strength, Coordination, [place], UE functional use, GMC, IADL Cognitive Skills: Understand, Problem Solve, Safety Awareness     Visit Diagnosis: Acute ischemic left MCA stroke (HCC)  Muscle weakness (generalized)  Other lack of coordination    Problem List Patient Active Problem List   Diagnosis Date Noted   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

## 2021-08-22 NOTE — Therapy (Signed)
[facility] MAIN [facility] SERVICES [address] [place], [place], [address] Phone: [phone]   Fax:  [phone]  Speech Language Pathology Treatment  Patient Details  Name: Peter Lucas MRN: [id number] Date of Birth: 12/24/1965 Referring Provider (SLP): Claudette Laws   Encounter Date: 08/19/2021   End of Session - 08/22/21 1001     Visit Number 3    Number of Visits 25    Date for SLP Re-Evaluation 11/02/21    Authorization Type [employer]    Authorization Time Period 08/10/2021 thru 11/02/2021    Authorization - Visit Number 3    Authorization - Number of Visits 31    Progress Note Due on Visit 10    SLP Start Time 0845    SLP Stop Time  1015    SLP Time Calculation (min) 90 min    Activity Tolerance Patient tolerated treatment well             Past Medical History:  Diagnosis Date   DM (diabetes mellitus) (HCC)    Hyperlipidemia    Hypertension     Past Surgical History:  Procedure Laterality Date   BACK SURGERY     cyst removal   IR CT HEAD LTD  05/07/2021   IR PERCUTANEOUS ART THROMBECTOMY/INFUSION INTRACRANIAL INC DIAG ANGIO  05/07/2021   RADIOLOGY WITH ANESTHESIA N/A 05/07/2021   Procedure: IR WITH ANESTHESIA;  Surgeon: Radiologist, Medication, MD;  [facility];  Service: Radiology;  Laterality: N/A;    There were no vitals filed for this visit.   Subjective Assessment - 08/22/21 0951     Subjective pt pleasant, accompanied by his daughter - more frequent islands of intelligible speech    Patient is accompained by: Family member    Currently in Pain? No/denies                   ADULT SLP TREATMENT - 08/22/21 0001       Treatment Provided   Treatment provided Cognitive-Linquistic      Cognitive-Linquistic Treatment   Treatment focused on Aphasia;Patient/family/caregiver education    Skilled Treatment WORD REPETITION: 10 of 20, improving to 20 of 20 with maximal verbal and visual cues  for articulatory placement; NAME CATEGORY: 10 of 10; WORD/PHRASE DESCRIPTION OF PICTURE - selecting from filed of two written choices; 100%; SPELLING - 2 out of 10 independently, pt aware when selecting letters that once selected, the letters are in the wrong order; MOY/DOW - maximal verbal, visual and auditory feedback provided to say each moth while looking at the printed word vs saying from rote memory              SLP Education - 08/22/21 1000     Education Details developing emergent awareness of semantic paraphasias, neologisms    Person(s) Educated Patient;Child(ren)    Methods Explanation;Demonstration;Verbal cues    Comprehension Need further instruction              SLP Short Term Goals - 08/12/21 1018       SLP SHORT TERM GOAL #1   Title With moderate multi-modal cues, pt will communicate orientation information in 8 out of 10 opportunities.    Baseline unable at this time    Time 10    Period --   sessions   Status New      SLP SHORT TERM GOAL #2   Title With moderate multimodal assistance, pt will imitate 2 syllables containing bilabials with 80%  accuracy in 8 out of 10 sessions.    Baseline unable at this time    Time 10    Period --   sessions   Status New      SLP SHORT TERM GOAL #3   Title With moderate multimodal cues, pt will follow 1-step directions with 80% accuracy in 8 out of 10 sessions.    Baseline unable at this time    Time 10    Period --   sessions   Status New      SLP SHORT TERM GOAL #4   Title Pt will scan to the right of midline to locate objects in 80% of opportunities across 5 sessions.    Baseline new goal    Time 10    Period --   sessions   Status New              SLP Long Term Goals - 08/12/21 1024       SLP LONG TERM GOAL #1   Title Pt will use multi-modal communication to communitcate basic wants and needs.    Baseline severe deficits    Time 12    Period Weeks    Status New    Target Date 11/02/21               Plan - 08/22/21 1001     Clinical Impression Statement While pt presents with increased islands of intelligible speech, he continues with moderate to severe language impairment impacting all areas of language. He is often unaware of his semantic paraphasias and neologism. Auditory feedback has been somewhat helpful in creating emergent awareness but needs to be continue consistently. As such, skilled ST intervention is required to increase pt's functional communication of basic wants/needs.    Speech Therapy Frequency 2x / week    Duration 12 weeks    Treatment/Interventions Language facilitation;Cueing hierarchy;Multimodal communcation approach;Compensatory strategies;SLP instruction and feedback;Functional tasks;Patient/family education    Potential to Achieve Goals Good    SLP Home Exercise Plan provided, see pt instructions section    Consulted and Agree with Plan of Care Patient;Family member/caregiver    Family Member Consulted pt's daughter             Patient will benefit from skilled therapeutic intervention in order to improve the following deficits and impairments:   Aphasia  Apraxia  Cerebrovascular accident (CVA) due to thrombosis of left middle cerebral artery (HCC)  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 Dreama Saa M.S., [name], [facility] Speech-Language Pathologist Rehabilitation Services Office [phone]  Reuel Derby 08/22/2021, 10:05 AM  [facility] MAIN [facility] SERVICES [address] [place], [place], [address] Phone: [phone]   Fax:  [phone]   Name: IZAIYAH KLEINMAN MRN: [id number] Date of Birth:  1966-05-23

## 2021-08-22 NOTE — Patient Instructions (Signed)
Complete TalkPath Therapy apps - Word recognition, Spelling, Word repetition,

## 2021-08-23 ENCOUNTER — Other Ambulatory Visit: Payer: Self-pay

## 2021-08-23 ENCOUNTER — Ambulatory Visit: Payer: BC Managed Care – PPO

## 2021-08-23 ENCOUNTER — Ambulatory Visit: Payer: BC Managed Care – PPO | Admitting: Speech Pathology

## 2021-08-23 DIAGNOSIS — R4701 Aphasia: Secondary | ICD-10-CM

## 2021-08-23 DIAGNOSIS — I639 Cerebral infarction, unspecified: Secondary | ICD-10-CM | POA: Diagnosis not present

## 2021-08-23 DIAGNOSIS — I63312 Cerebral infarction due to thrombosis of left middle cerebral artery: Secondary | ICD-10-CM

## 2021-08-24 NOTE — Therapy (Signed)
[place] [facility] MAIN [facility] SERVICES [address] [place], [place], [address] Phone: [phone]   Fax:  [phone]  Speech Language Pathology Treatment  Patient Details  Name: Peter Lucas MRN: [id number] Date of Birth: 01-28-1966 Referring Provider (SLP): Claudette Laws   Encounter Date: 08/23/2021   End of Session - 08/24/21 2014     Visit Number 4    Number of Visits 25    Date for SLP Re-Evaluation 11/02/21    Authorization Type [employer]    Authorization Time Period 08/10/2021 thru 11/02/2021    Authorization - Visit Number 4    Authorization - Number of Visits 30    Progress Note Due on Visit 10    SLP Start Time 0900    SLP Stop Time  1000    SLP Time Calculation (min) 60 min    Activity Tolerance Patient tolerated treatment well             Past Medical History:  Diagnosis Date   DM (diabetes mellitus) (HCC)    Hyperlipidemia    Hypertension     Past Surgical History:  Procedure Laterality Date   BACK SURGERY     cyst removal   IR CT HEAD LTD  05/07/2021   IR PERCUTANEOUS ART THROMBECTOMY/INFUSION INTRACRANIAL INC DIAG ANGIO  05/07/2021   RADIOLOGY WITH ANESTHESIA N/A 05/07/2021   Procedure: IR WITH ANESTHESIA;  Surgeon: Radiologist, Medication, MD;  [facility];  Service: Radiology;  Laterality: N/A;    There were no vitals filed for this visit.   Subjective Assessment - 08/24/21 2002     Subjective pleasnat, accompanied by his sister - more frequent islands of intelligibible speech    Patient is accompained by: Family member    Currently in Pain? No/denies                   ADULT SLP TREATMENT - 08/24/21 0001       Treatment Provided   Treatment provided Cognitive-Linquistic      Cognitive-Linquistic Treatment   Treatment focused on Aphasia;Patient/family/caregiver education    Skilled Treatment [employer] Therapy app on iPad used to target multiple skill areas: ANSWERING GENERAL  KNOWLEDGE QUESTIONS - 65% (13/20) answers correctly selected; WORD REPETITION - 70% (14/20) improving to 100% intelligibility with maximal multimodal cues as well as audio-feedback to increase emergent awareness of errors; moderate faded to minimal assistance required to say MOY purposefully (not from rote memory), auditory feedback helpful to improve from 9/12 months to 12/12 months over 5 trials; Total assistance for pt to understand/express answers to questions "What month are we in?"              SLP Education - 08/24/21 2013     Education Details provided    [employer]) Educated Patient;Other (comment)   sister   Methods Explanation;Demonstration;Verbal cues    Comprehension Verbalized understanding;Need further instruction              SLP Short Term Goals - 08/12/21 1018       SLP SHORT TERM GOAL #1   Title With moderate multi-modal cues, pt will communicate orientation information in 8 out of 10 opportunities.    Baseline unable at this time    Time 10    Period --   sessions   Status New      SLP SHORT TERM GOAL #2   Title With moderate multimodal assistance, pt will imitate 2 syllables containing bilabials with 80%  accuracy in 8 out of 10 sessions.    Baseline unable at this time    Time 10    Period --   sessions   Status New      SLP SHORT TERM GOAL #3   Title With moderate multimodal cues, pt will follow 1-step directions with 80% accuracy in 8 out of 10 sessions.    Baseline unable at this time    Time 10    Period --   sessions   Status New      SLP SHORT TERM GOAL #4   Title Pt will scan to the right of midline to locate objects in 80% of opportunities across 5 sessions.    Baseline new goal    Time 10    Period --   sessions   Status New              SLP Long Term Goals - 08/12/21 1024       SLP LONG TERM GOAL #1   Title Pt will use multi-modal communication to communitcate basic wants and needs.    Baseline severe deficits    Time 12     Period Weeks    Status New    Target Date 11/02/21              Plan - 08/24/21 2016     Clinical Impression Statement While pt presents with increased islands of intelligible speech, he continues with moderate to severe language impairment impacting all areas of language. He is often unaware of his semantic paraphasias and neologism. Auditory feedback has been somewhat helpful in creating emergent awareness but needs to be continue consistently. As such, skilled ST intervention is required to increase pt's functional communication of basic wants/needs.    Speech Therapy Frequency 2x / week    Duration 12 weeks    Treatment/Interventions Language facilitation;Cueing hierarchy;Multimodal communcation approach;Compensatory strategies;SLP instruction and feedback;Functional tasks;Patient/family education    Potential to Achieve Goals Good    SLP Home Exercise Plan provided, see pt instructions section    Consulted and Agree with Plan of Care Patient;Family member/caregiver    Family Member Consulted pt's sister             Patient will benefit from skilled therapeutic intervention in order to improve the following deficits and impairments:   Aphasia  Cerebrovascular accident (CVA) due to thrombosis of left middle cerebral artery [facility])    Problem List Patient Active Problem List   Diagnosis Date Noted   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 Dreama Saa M.S., [name], [facility] Speech-Language Pathologist Rehabilitation Services Office [phone]  Reuel Derby 08/24/2021, 8:17 PM  [facility] [employer] [facility] MAIN [facility] SERVICES [address] [place], [place], [address] Phone: [phone]   Fax:  [phone]   Name: Peter Lucas MRN: [id number] Date of Birth: 1965-12-25

## 2021-08-24 NOTE — Patient Instructions (Signed)
Complete TalkPath Therapy app

## 2021-08-26 ENCOUNTER — Ambulatory Visit: Payer: BC Managed Care – PPO | Admitting: Speech Pathology

## 2021-08-26 ENCOUNTER — Ambulatory Visit: Payer: BC Managed Care – PPO

## 2021-08-30 ENCOUNTER — Other Ambulatory Visit: Payer: Self-pay

## 2021-08-30 ENCOUNTER — Ambulatory Visit: Payer: BC Managed Care – PPO | Admitting: Speech Pathology

## 2021-08-30 ENCOUNTER — Encounter: Payer: Self-pay | Admitting: Physical Therapy

## 2021-08-30 ENCOUNTER — Ambulatory Visit: Payer: BC Managed Care – PPO | Admitting: Physical Therapy

## 2021-08-30 ENCOUNTER — Ambulatory Visit: Payer: BC Managed Care – PPO

## 2021-08-30 DIAGNOSIS — R2689 Other abnormalities of gait and mobility: Secondary | ICD-10-CM

## 2021-08-30 DIAGNOSIS — I63312 Cerebral infarction due to thrombosis of left middle cerebral artery: Secondary | ICD-10-CM

## 2021-08-30 DIAGNOSIS — R4701 Aphasia: Secondary | ICD-10-CM

## 2021-08-30 DIAGNOSIS — M6281 Muscle weakness (generalized): Secondary | ICD-10-CM

## 2021-08-30 DIAGNOSIS — R482 Apraxia: Secondary | ICD-10-CM

## 2021-08-30 DIAGNOSIS — R278 Other lack of coordination: Secondary | ICD-10-CM

## 2021-08-30 DIAGNOSIS — R2681 Unsteadiness on feet: Secondary | ICD-10-CM

## 2021-08-30 DIAGNOSIS — I639 Cerebral infarction, unspecified: Secondary | ICD-10-CM

## 2021-08-30 NOTE — Therapy (Signed)
[place] MAIN [facility] SERVICES [address] [place], [place], [address] Phone: [phone]   Fax:  [phone]  Physical Therapy Treatment/Discharge Summary  Patient Details  Name: Peter Lucas MRN: [id number] Date of Birth: 1966-05-23 Referring Provider (PT): Charlett Blake   Encounter Date: 08/30/2021   PT End of Session - 08/30/21 1158     Visit Number 3    Number of Visits 16    Date for PT Re-Evaluation 10/06/21    Authorization Type 3/10 eval 08/10/21    PT Start Time 1200    PT Stop Time 1230    PT Time Calculation (min) 30 min    Equipment Utilized During Treatment Gait belt    Activity Tolerance Patient tolerated treatment well    Behavior During Therapy [facility] for tasks assessed/performed             Past Medical History:  Diagnosis Date   DM (diabetes mellitus) ([place])    Hyperlipidemia    Hypertension     Past Surgical History:  Procedure Laterality Date   BACK SURGERY     cyst removal   IR CT HEAD LTD  05/07/2021   IR PERCUTANEOUS ART THROMBECTOMY/INFUSION INTRACRANIAL INC DIAG ANGIO  05/07/2021   RADIOLOGY WITH ANESTHESIA N/A 05/07/2021   Procedure: IR WITH ANESTHESIA;  Surgeon: Radiologist, Medication, MD;  Location: [place];  Service: Radiology;  Laterality: N/A;    There were no vitals filed for this visit.   Subjective Assessment - 08/30/21 1200     Subjective Patient reports things are going fine. No new falls; Wife isn't sure if he has been doing HEP but he has been mowing the yard, feeding the chickens, weed eating;    Patient is accompained by: Family member   Sister Peter Lucas   Pertinent History Patient is a 55 y.o male who presents to physical therapy s/p ischemic CVA of frontal lobe.  Patient was Independent prior to CVA working at [employer].  He has good family support with multiple family members in the area. PMH includes CKD, DM stroke, HLD, HTN.  Presented 05/07/2021 with acute onset of aphasia and right side  weakness.  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  No hemorrhagic transformation.  Patient did not receive tPA.  CTA of head and neck positive for occlusion of the posterior left MCA branch, distal M2 versus proximal M3.  Patient underwent thrombectomy per interventional radiology.  Latest cranial CT scan showed more pronounced low density within the left MCA branch vessel infarction.  No midline shift or hemorrhage.  Echocardiogram ejection fraction of 55 to 60% no wall motion abnormalities  Patient did require initial intubation after thrombectomy was ultimately extubated but became hypoxic requiring reintubation with final extubation 05/09/2021.  He did spike a low-grade fever respiratory cultures. Therapy evaluations completed due to patient's decreased functional mobility and aphasia was admitted for a comprehensive rehab program.  Patient transferred to CIR on 05/12/2021 . He was discharged 05/26/21 from [facility] and underwent Home Health therapy for OT and SLP which ended the week of August 8th.  Patient is able to mow, walk, and perform ADLs.  His balance is challenged per daughter report as well has his ability to negotiate stairs.  Has an eye physician appointment on September 27    Limitations Lifting;Standing;Walking;House hold activities    How long can you sit comfortably? n/a    How long can you stand comfortably? no  limitations    How long can you walk comfortably? store    Patient Stated Goals Daughter reports goals are to improve strength balance and to give home program for strengthening    Currently in Pain? No/denies    Multiple Pain Sites No              TREATMENT: Warm up on Treadmill 2.5 mph without arm rests with good gait mechanics and safety, good foot clearance noted and no instability;  Vitals after gait: BP: 153/82, HR 59    [facility] PT Assessment - 08/30/21 0001       High Level Balance   High Level Balance Comments  able to hold SLS 15 sec each LE; able to hold tandem stance 30 sec unsupported      Functional Gait  Assessment   Gait Level Surface Walks 20 ft in less than 5.5 sec, no assistive devices, good speed, no evidence for imbalance, normal gait pattern, deviates no more than 6 in outside of the 12 in walkway width.    Change in Gait Speed Able to change speed, demonstrates mild gait deviations, deviates 6-10 in outside of the 12 in walkway width, or no gait deviations, unable to achieve a major change in velocity, or uses a change in velocity, or uses an assistive device.    Gait with Horizontal Head Turns Performs head turns smoothly with no change in gait. Deviates no more than 6 in outside 12 in walkway width    Gait with Vertical Head Turns Performs head turns with no change in gait. Deviates no more than 6 in outside 12 in walkway width.    Gait and Pivot Turn Pivot turns safely within 3 sec and stops quickly with no loss of balance.    Gait with Narrow Base of Support Ambulates 7-9 steps.    Gait with Eyes Closed Walks 20 ft, uses assistive device, slower speed, mild gait deviations, deviates 6-10 in outside 12 in walkway width. Ambulates 20 ft in less than 9 sec but greater than 7 sec.    Ambulating Backwards Walks 20 ft, uses assistive device, slower speed, mild gait deviations, deviates 6-10 in outside 12 in walkway width.    Steps Alternating feet, no rail.             TREATMENT:  Instructed patient in Functional Gait assessment, see above;  BLE gross strength 5/5  FOTO score 84%  Patient reports feeling like he is physically functioning at baseline. He reports his speech and cognition is his biggest limiting factor. Patient has met most goals, all goals not formally assessed due to time contraints. However since patient is functioning modified independent with gait and balance and exhibits good LE strength he is appropriate for discharge from PT.  Patient and caregiver agreeable.                         PT Education - 08/30/21 1201     Education Details progress towards goals, recommendation;    Person(s) Educated Patient    Methods Explanation;Verbal cues    Comprehension Verbalized understanding;Returned demonstration;Verbal cues required;Need further instruction              PT Short Term Goals - 08/30/21 1307       PT SHORT TERM GOAL #1   Title Patient will be independent in home exercise program to improve strength/mobility for better functional independence with ADLs.    Baseline 9/7: HEP given,  9/27: not formally doing, but very active at home;    Time 4    Period Weeks    Status Partially Met    Target Date 09/08/21      PT SHORT TERM GOAL #2   Title Patient (< 21 years old) will complete five times sit to stand test in < 10 seconds indicating an increased LE strength and improved balance.    Baseline 9/7: 16 seconds, 9/27: deferred due to time; patient abl eto transfer sit to stand without pushing on arm rests with no difficulty;    Time 4    Period Weeks    Status On-going    Target Date 09/08/21               PT Long Term Goals - 08/30/21 1223       PT LONG TERM GOAL #1   Title Patient will increase FOTO score to equal to or greater than  75%  to demonstrate statistically significant improvement in mobility and quality of life.    Baseline 9/8: 68% with daughter asssist due to cognition, 9/27: 84%    Time 8    Period Weeks    Status Achieved    Target Date 10/06/21      PT LONG TERM GOAL #2   Title Patient will increase Berg Balance score by > 6 points (50/56) to demonstrate decreased fall risk during functional activities.    Baseline 9/7: 44/56, 9/27: not formally assessed, able to hold SLS 15 sec, tandem stance 30 sec;    Time 8    Period Weeks    Status Partially Met    Target Date 10/06/21      PT LONG TERM GOAL #3   Title Patient will increase six minute walk test distance to >1500 for  progression to age norm community ambulator and improve gait ability    Baseline 9/8: 1300 ft, 9/27: not formally assessed but able to walk 2/10 of mile in 3 min on treadmill.    Time 8    Period Weeks    Status On-going    Target Date 10/06/21      PT LONG TERM GOAL #4   Title Patient will increase Functional Gait Assessment score to >22/30 as to reduce fall risk and improve dynamic gait safety with community ambulation.    Baseline 9/7: 17/30, 9/27: 25/30    Time 8    Period Weeks    Status Achieved    Target Date 10/06/21      PT LONG TERM GOAL #5   Title Patient will increase BLE gross strength to 4+/5 as to improve functional strength for independent gait, increased standing tolerance and increased ADL ability.    Baseline 9/7: see note, 9/27: 5/5    Time 8    Period Weeks    Status Achieved    Target Date 10/06/21                   Plan - 08/30/21 1303     Clinical Impression Statement Patient motivated and participated well within session. He presents to therapy late this session. He reports physically he thinks he is close to baseline. He denies any difficulty with walking or balance. He denies any new falls. Patient was able to walk on treadmill at good pace without railings with good stability. He was instructed in balance assessment and tested as low risk for falls. He also reports improved functional mobility as evidenced on FOTO survey.  Based on current functional mobility, PT recommends discharge from outpatient PT. Patient will still benefit from skilled speech therapy and OT to assist with cognition and ADLs respectively. Patient and caregiver verbalized understanding and agreement.    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[employer] Evolving/Moderate complexity    Rehab Potential Fair    PT Frequency 2x / week    PT Duration 8 weeks    PT Treatment/Interventions ADLs/Self Care Home Management;Canalith Repostioning;Cryotherapy;Electrical Stimulation;Moist Heat;Ultrasound;DME Instruction;Gait training;Stair training;Functional mobility training;Therapeutic activities;Patient/family education;Neuromuscular re-education;Cognitive remediation;Balance training;Therapeutic exercise;Manual techniques;Energy conservation;Dry needling;Passive range of motion;Taping;Vestibular;Visual/perceptual remediation/compensation    PT Next Visit Plan high level balance, scanning room, strengthening    PT Home Exercise Plan see above    Consulted and Agree with Plan of Care Patient;Family member/caregiver    Family Member Consulted daughter             Patient will benefit from skilled therapeutic intervention in order to improve the following deficits and impairments:  Abnormal gait, Decreased activity tolerance, Decreased balance, Decreased knowledge of precautions, Decreased endurance, Decreased cognition, Decreased coordination, Decreased safety awareness, Difficulty walking, Decreased strength, Impaired perceived functional ability, Improper body mechanics  Visit Diagnosis: Apraxia  Muscle weakness (generalized)  Other lack of coordination  Other abnormalities of gait and mobility  Unsteadiness on feet     Problem List Patient Active Problem List   Diagnosis Date Noted   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 ([place]) 05/12/2021   Stroke ([place]) 05/07/2021   Acute ischemic left MCA stroke ([place]) 05/07/2021   Middle cerebral artery embolism, left 05/07/2021     Lillybeth Tal, PT, DPT 08/30/2021, 1:06 PM  [place] MAIN [facility] SERVICES [address] [place], [place], [address] Phone: [phone]   Fax:  [phone]  Name: DAO MEMMOTT MRN: [id number] Date of Birth: May 25, 1966

## 2021-08-31 NOTE — Therapy (Signed)
[place] [facility] MAIN [facility] SERVICES [address] [place], [place], [address] Phone: [phone]   Fax:  [phone]  Speech Language Pathology Treatment  Patient Details  Name: Peter Lucas MRN: [id number] Date of Birth: July 09, 1966 Referring Provider (SLP): Claudette Laws   Encounter Date: 08/30/2021   End of Session - 08/31/21 1710     Visit Number 5    Number of Visits 25    Date for SLP Re-Evaluation 11/02/21    Authorization Type [employer]    Authorization Time Period 08/10/2021 thru 11/02/2021    Authorization - Visit Number 5    Authorization - Number of Visits 34    Progress Note Due on Visit 10    SLP Start Time 1300    SLP Stop Time  1400    SLP Time Calculation (min) 60 min    Activity Tolerance Patient tolerated treatment well             Past Medical History:  Diagnosis Date   DM (diabetes mellitus) ([facility])    Hyperlipidemia    Hypertension     Past Surgical History:  Procedure Laterality Date   BACK SURGERY     cyst removal   IR CT HEAD LTD  05/07/2021   IR PERCUTANEOUS ART THROMBECTOMY/INFUSION INTRACRANIAL INC DIAG ANGIO  05/07/2021   RADIOLOGY WITH ANESTHESIA N/A 05/07/2021   Procedure: IR WITH ANESTHESIA;  Surgeon: Radiologist, Medication, MD;  [facility];  Service: Radiology;  Laterality: N/A;    There were no vitals filed for this visit.   Subjective Assessment - 08/31/21 1658     Subjective pt accompanied by his sister    Patient is accompained by: Family member    Currently in Pain? No/denies                   ADULT SLP TREATMENT - 08/31/21 0001       Treatment Provided   Treatment provided Cognitive-Linquistic      Cognitive-Linquistic Treatment   Treatment focused on Aphasia;Patient/family/caregiver education    Skilled Treatment LANGUAGE: pt able to slowly state MOY as well as answer question "What month are we in?" however pt unable to select verbally requested month  from field of 2, also unable to verbally name selected month, semantic perseveration of "February" without awareness, audiofeedback provided as well maximal cues for articulatory cues from SLP              SLP Education - 08/31/21 1709     Education Details semantic paraphasias    Person(s) Educated Patient;Other (comment)   pt's sister [name]   Methods Explanation;Demonstration;Verbal cues;Handout;Tactile cues    Comprehension Need further instruction              SLP Short Term Goals - 08/12/21 1018       SLP SHORT TERM GOAL #1   Title With moderate multi-modal cues, pt will communicate orientation information in 8 out of 10 opportunities.    Baseline unable at this time    Time 10    Period --   sessions   Status New      SLP SHORT TERM GOAL #2   Title With moderate multimodal assistance, pt will imitate 2 syllables containing bilabials with 80% accuracy in 8 out of 10 sessions.    Baseline unable at this time    Time 10    Period --   sessions   Status New  SLP SHORT TERM GOAL #3   Title With moderate multimodal cues, pt will follow 1-step directions with 80% accuracy in 8 out of 10 sessions.    Baseline unable at this time    Time 10    Period --   sessions   Status New      SLP SHORT TERM GOAL #4   Title Pt will scan to the right of midline to locate objects in 80% of opportunities across 5 sessions.    Baseline new goal    Time 10    Period --   sessions   Status New              SLP Long Term Goals - 08/12/21 1024       SLP LONG TERM GOAL #1   Title Pt will use multi-modal communication to communitcate basic wants and needs.    Baseline severe deficits    Time 12    Period Weeks    Status New    Target Date 11/02/21              Plan - 08/31/21 1712     Clinical Impression Statement While pt presents with increased islands of intelligible speech, he continues with moderate to severe language impairment impacting all areas of  language. He is often unaware of his semantic paraphasias with maximal cues for impulsivity. Auditory feedback has been somewhat helpful in creating emergent awareness but needs to be continue consistently. As such, skilled ST intervention is required to increase pt's functional communication of basic wants/needs.    Speech Therapy Frequency 2x / week    Duration 12 weeks    Treatment/Interventions Language facilitation;Cueing hierarchy;Multimodal communcation approach;Compensatory strategies;SLP instruction and feedback;Functional tasks;Patient/family education    Potential to Achieve Goals Good    SLP Home Exercise Plan provided, see pt instructions section    Consulted and Agree with Plan of Care Patient;Family member/caregiver    Family Member Consulted pt's sister             Patient will benefit from skilled therapeutic intervention in order to improve the following deficits and impairments:   Aphasia  Cerebrovascular accident (CVA) due to thrombosis of left middle cerebral artery (HCC)  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 [facility])    Problem List Patient Active Problem List   Diagnosis Date Noted   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 ([facility]) 05/12/2021   Stroke (HCC) 05/07/2021   Acute ischemic left MCA stroke ([facility]) 05/07/2021   Middle cerebral artery embolism, left 05/07/2021   Cavin Longman B. Dreama Saa M.S., [name], [facility] Speech-Language Pathologist Rehabilitation Services Office [phone]  Reuel Derby 08/31/2021, 5:14 PM  [facility] MAIN [facility] SERVICES [address] [place], [place], [address] Phone: [phone]   Fax:  [phone]   Name: MATHEWS STUHR MRN: [id number] Date of Birth: 09-Feb-1966

## 2021-08-31 NOTE — Patient Instructions (Signed)
Continue utilizing TalkPath app

## 2021-09-01 ENCOUNTER — Ambulatory Visit: Payer: BC Managed Care – PPO

## 2021-09-01 ENCOUNTER — Other Ambulatory Visit: Payer: Self-pay

## 2021-09-01 ENCOUNTER — Encounter: Payer: BC Managed Care – PPO | Admitting: Occupational Therapy

## 2021-09-01 ENCOUNTER — Encounter: Payer: BC Managed Care – PPO | Admitting: Speech Pathology

## 2021-09-01 ENCOUNTER — Ambulatory Visit: Payer: BC Managed Care – PPO | Admitting: Speech Pathology

## 2021-09-01 DIAGNOSIS — I639 Cerebral infarction, unspecified: Secondary | ICD-10-CM

## 2021-09-01 DIAGNOSIS — I63312 Cerebral infarction due to thrombosis of left middle cerebral artery: Secondary | ICD-10-CM

## 2021-09-01 DIAGNOSIS — R4701 Aphasia: Secondary | ICD-10-CM

## 2021-09-02 NOTE — Therapy (Signed)
[place] [facility] MAIN [facility] SERVICES [address] [place], [place], [address] Phone: [phone]   Fax:  [phone]  Speech Language Pathology Treatment  Patient Details  Name: Peter Lucas MRN: [id number] Date of Birth: 06/20/1966 Referring Provider (SLP): Claudette Laws   Encounter Date: 09/01/2021   End of Session - 09/02/21 0849     Visit Number 6    Number of Visits 25    Date for SLP Re-Evaluation 11/02/21    Authorization Type [employer]    Authorization Time Period 08/10/2021 thru 11/02/2021    Authorization - Visit Number 6    Authorization - Number of Visits 34    Progress Note Due on Visit 10    SLP Start Time 0845    SLP Stop Time  1000    SLP Time Calculation (min) 75 min    Activity Tolerance Patient tolerated treatment well             Past Medical History:  Diagnosis Date   DM (diabetes mellitus) (HCC)    Hyperlipidemia    Hypertension     Past Surgical History:  Procedure Laterality Date   BACK SURGERY     cyst removal   IR CT HEAD LTD  05/07/2021   IR PERCUTANEOUS ART THROMBECTOMY/INFUSION INTRACRANIAL INC DIAG ANGIO  05/07/2021   RADIOLOGY WITH ANESTHESIA N/A 05/07/2021   Procedure: IR WITH ANESTHESIA;  Surgeon: Radiologist, Medication, MD;  [facility];  Service: Radiology;  Laterality: N/A;    There were no vitals filed for this visit.   Subjective Assessment - 09/02/21 0825     Subjective pt became emotional during receptive language activity    Patient is accompained by: Family member    Currently in Pain? No/denies                   ADULT SLP TREATMENT - 09/02/21 0001       Treatment Provided   Treatment provided Cognitive-Linquistic      Cognitive-Linquistic Treatment   Treatment focused on Aphasia;Patient/family/caregiver education    Skilled Treatment T[employer] Therapy app on iPad used to target multiple skill areas: ANSWERING GENERAL KNOWLEDGE QUESTIONS - level 2 - 90%  (18/20) answers correctly selected; NAME THE CATEGORY - level 2 - 90%; ANSWERING QUESTIONS - level 2 - 90% with independent pt repetition of questions; RECEPTIVE LANGUAGE activity - pt able to select from list of printed months with 40% accuracy d/t deficits in receptive language; maximal assistance provided d/t neologisms for intelligible speech when reading his address              SLP Education - 09/02/21 0837     Education Details receptive language deficits, neologisms, impact of language impairment on reading, writing    Person(s) Educated Patient;Other (comment)   pt's sister   Methods Explanation;Demonstration;Verbal cues;Handout;Tactile cues    Comprehension Need further instruction              SLP Short Term Goals - 08/12/21 1018       SLP SHORT TERM GOAL #1   Title With moderate multi-modal cues, pt will communicate orientation information in 8 out of 10 opportunities.    Baseline unable at this time    Time 10    Period --   sessions   Status New      SLP SHORT TERM GOAL #2   Title With moderate multimodal assistance, pt will imitate 2 syllables containing bilabials with 80% accuracy in  8 out of 10 sessions.    Baseline unable at this time    Time 10    Period --   sessions   Status New      SLP SHORT TERM GOAL #3   Title With moderate multimodal cues, pt will follow 1-step directions with 80% accuracy in 8 out of 10 sessions.    Baseline unable at this time    Time 10    Period --   sessions   Status New      SLP SHORT TERM GOAL #4   Title Pt will scan to the right of midline to locate objects in 80% of opportunities across 5 sessions.    Baseline new goal    Time 10    Period --   sessions   Status New              SLP Long Term Goals - 08/12/21 1024       SLP LONG TERM GOAL #1   Title Pt will use multi-modal communication to communitcate basic wants and needs.    Baseline severe deficits    Time 12    Period Weeks    Status New     Target Date 11/02/21              Plan - 09/02/21 0850     Clinical Impression Statement As pt has more islands of intelligible speech, it has become more apparent that pt has signfiicant deficits in receptive language, reading comprehension, letter recognition. As a result, pt continues to be unaware of his neologistic speech patterns. Intensive skilled ST intervention is required to target these severe deficits to increase pt's functional independence and reduce caregiver burden.    Speech Therapy Frequency 2x / week    Duration 12 weeks    Treatment/Interventions Language facilitation;Cueing hierarchy;Multimodal communcation approach;Compensatory strategies;SLP instruction and feedback;Functional tasks;Patient/family education    Potential to Achieve Goals Good    SLP Home Exercise Plan provided, see pt instructions section    Consulted and Agree with Plan of Care Patient;Family member/caregiver    Family Member Consulted pt's sister             Patient will benefit from skilled therapeutic intervention in order to improve the following deficits and impairments:   Aphasia  Cerebrovascular accident (CVA) due to thrombosis of left middle cerebral artery (HCC)  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 [facility])    Problem List Patient Active Problem List   Diagnosis Date Noted   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 (HCC) 05/12/2021   Stroke (HCC) 05/07/2021   Acute ischemic left MCA stroke (HCC) 05/07/2021   Middle cerebral artery embolism, left 05/07/2021   Avaneesh Pepitone B. Dreama Saa M.S., [name], [facility] Speech-Language Pathologist Rehabilitation Services Office [phone]  Reuel Derby 09/02/2021, 9:04 AM  [facility] MAIN [facility] SERVICES [address] [place], [place], [address] Phone: [phone]   Fax:  [phone]   Name: Peter Lucas MRN: [id number] Date of Birth: 09-07-1966

## 2021-09-02 NOTE — Patient Instructions (Signed)
Complete assigned tasks within TalkPath Therapy App Copy and generate pt's address

## 2021-09-06 ENCOUNTER — Ambulatory Visit: Payer: BC Managed Care – PPO | Admitting: Physical Therapy

## 2021-09-06 ENCOUNTER — Other Ambulatory Visit: Payer: Self-pay

## 2021-09-06 ENCOUNTER — Ambulatory Visit: Payer: BC Managed Care – PPO | Attending: Physical Medicine & Rehabilitation | Admitting: Speech Pathology

## 2021-09-06 ENCOUNTER — Ambulatory Visit: Payer: BC Managed Care – PPO

## 2021-09-06 DIAGNOSIS — R4701 Aphasia: Secondary | ICD-10-CM | POA: Diagnosis not present

## 2021-09-06 DIAGNOSIS — I63512 Cerebral infarction due to unspecified occlusion or stenosis of left middle cerebral artery: Secondary | ICD-10-CM | POA: Diagnosis present

## 2021-09-06 DIAGNOSIS — I63312 Cerebral infarction due to thrombosis of left middle cerebral artery: Secondary | ICD-10-CM | POA: Insufficient documentation

## 2021-09-06 DIAGNOSIS — R41841 Cognitive communication deficit: Secondary | ICD-10-CM | POA: Insufficient documentation

## 2021-09-06 DIAGNOSIS — I639 Cerebral infarction, unspecified: Secondary | ICD-10-CM | POA: Insufficient documentation

## 2021-09-07 NOTE — Patient Instructions (Signed)
Continue using TalkPath Therapy app for practice everyday (30 minutes)

## 2021-09-07 NOTE — Therapy (Signed)
[facility] MAIN [facility] SERVICES [address] [place], [place], [address] Phone: [phone]   Fax:  [phone]  Speech Language Pathology Treatment  Patient Details  Name: Peter Lucas MRN: [id number] Date of Birth: [birth year] Referring Provider (SLP): Claudette Laws   Encounter Date: 09/06/2021   End of Session - 09/07/21 1146     Visit Number 7    Number of Visits 25    Date for SLP Re-Evaluation 11/02/21    Authorization Type [employer]    Authorization Time Period 08/10/2021 thru 11/02/2021    Authorization - Visit Number 7    Progress Note Due on Visit 10    SLP Start Time 1300    SLP Stop Time  1400    SLP Time Calculation (min) 60 min    Activity Tolerance Patient tolerated treatment well             Past Medical History:  Diagnosis Date   DM (diabetes mellitus) (HCC)    Hyperlipidemia    Hypertension     Past Surgical History:  Procedure Laterality Date   BACK SURGERY     cyst removal   IR CT HEAD LTD  05/07/2021   IR PERCUTANEOUS ART THROMBECTOMY/INFUSION INTRACRANIAL INC DIAG ANGIO  05/07/2021   RADIOLOGY WITH ANESTHESIA N/A 05/07/2021   Procedure: IR WITH ANESTHESIA;  Surgeon: Radiologist, Medication, MD;  [facility];  Service: Radiology;  Laterality: N/A;    There were no vitals filed for this visit.   Subjective Assessment - 09/07/21 1144     Subjective "rough weekend, no power, we didn't practice"    Patient is accompained by: Family member    Currently in Pain? No/denies                   ADULT SLP TREATMENT - 09/07/21 0001       Treatment Provided   Treatment provided Cognitive-Linquistic      Cognitive-Linquistic Treatment   Treatment focused on Aphasia;Patient/family/caregiver education    Skilled Treatment LANGUAGE: READING COMPREHENSION pt continues to demonstrate strengths in reading comprehension, therefore basic one step directions were written, pt read them and  accurately performed direction - AUDITORY COMPREHENSION - after reading the above one step directions, pt given direction with auditory only - no ability to comprehend spoken direction, auditory paired with written, pt able to follow with 100% once written cue present, pt was 69% accurate when selecting written direction from field of 3 when given auditory cue only, when pt attempted to read direction aloud, his spoken language contained phonemic, semantic paraphasias as well as perseveration and prevented pt from following written direction, pt unable to answer basic biographical verbal questions but was able to with written support, education provided on supplementing auditory information with written cues              SLP Education - 09/07/21 1146     Education Details receptive language    Person(s) Educated Patient;Other (comment)   pt's sister   Methods Explanation;Demonstration;Verbal cues    Comprehension Verbalized understanding;Need further instruction              SLP Short Term Goals - 08/12/21 1018       SLP SHORT TERM GOAL #1   Title With moderate multi-modal cues, pt will communicate orientation information in 8 out of 10 opportunities.    Baseline unable at this time    Time 10    Period --  sessions   Status New      SLP SHORT TERM GOAL #2   Title With moderate multimodal assistance, pt will imitate 2 syllables containing bilabials with 80% accuracy in 8 out of 10 sessions.    Baseline unable at this time    Time 10    Period --   sessions   Status New      SLP SHORT TERM GOAL #3   Title With moderate multimodal cues, pt will follow 1-step directions with 80% accuracy in 8 out of 10 sessions.    Baseline unable at this time    Time 10    Period --   sessions   Status New      SLP SHORT TERM GOAL #4   Title Pt will scan to the right of midline to locate objects in 80% of opportunities across 5 sessions.    Baseline new goal    Time 10    Period --    sessions   Status New              SLP Long Term Goals - 08/12/21 1024       SLP LONG TERM GOAL #1   Title Pt will use multi-modal communication to communitcate basic wants and needs.    Baseline severe deficits    Time 12    Period Weeks    Status New    Target Date 11/02/21              Plan - 09/07/21 1146     Clinical Impression Statement As pt has more islands of intelligible speech (several cohesive sentences together), it has become more apparent that pt has signfiicant deficits in receptive language, reading comprehension, letter recognition. Pt demonstrated increased awareness of paraphasic speech but was not able to produce accurately. Intensive skilled ST intervention is required to target these severe deficits to increase pt's functional independence and reduce caregiver burden.    Speech Therapy Frequency 2x / week    Duration 12 weeks    Treatment/Interventions Language facilitation;Cueing hierarchy;Multimodal communcation approach;Compensatory strategies;SLP instruction and feedback;Functional tasks;Patient/family education    Potential to Achieve Goals Good    SLP Home Exercise Plan provided, see pt instructions section    Consulted and Agree with Plan of Care Patient;Family member/caregiver    Family Member Consulted pt's sister             Patient will benefit from skilled therapeutic intervention in order to improve the following deficits and impairments:   Aphasia  Cerebrovascular accident (CVA) due to thrombosis of left middle cerebral artery (HCC)  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 Dreama Saa M.S., [name], [facility] Speech-Language  Pathologist Rehabilitation Services Office [phone]  Reuel Derby 09/07/2021, 11:48 AM  [facility] MAIN [facility] SERVICES [address] [place], [place], [address] Phone: [phone]   Fax:  [phone]   Name: Peter Lucas MRN: [id number] Date of Birth: 1966-01-02

## 2021-09-09 ENCOUNTER — Ambulatory Visit: Payer: BC Managed Care – PPO | Admitting: Speech Pathology

## 2021-09-09 ENCOUNTER — Ambulatory Visit: Payer: BC Managed Care – PPO

## 2021-09-09 ENCOUNTER — Other Ambulatory Visit: Payer: Self-pay

## 2021-09-09 DIAGNOSIS — I639 Cerebral infarction, unspecified: Secondary | ICD-10-CM

## 2021-09-09 DIAGNOSIS — I63312 Cerebral infarction due to thrombosis of left middle cerebral artery: Secondary | ICD-10-CM

## 2021-09-09 DIAGNOSIS — R4701 Aphasia: Secondary | ICD-10-CM

## 2021-09-09 NOTE — Patient Instructions (Signed)
Continue completely subsets on TalkPath Therapy app

## 2021-09-09 NOTE — Therapy (Signed)
[place] [facility] MAIN [facility] SERVICES [address] [place], [place], [address] Phone: [phone]   Fax:  [phone]  Speech Language Pathology Treatment  Patient Details  Name: Peter Lucas MRN: [id number] Date of Birth: [birth year] Referring Provider (SLP): Claudette Laws   Encounter Date: 09/09/2021   End of Session - 09/09/21 1518     Visit Number 8    Number of Visits 25    Date for SLP Re-Evaluation 11/02/21    Authorization Type [employer]    Authorization Time Period 08/10/2021 thru 11/02/2021    Authorization - Visit Number 8    Authorization - Number of Visits 34    Progress Note Due on Visit 10    SLP Start Time 0918    SLP Stop Time  1020    SLP Time Calculation (min) 62 min    Activity Tolerance Patient tolerated treatment well             Past Medical History:  Diagnosis Date   DM (diabetes mellitus) (HCC)    Hyperlipidemia    Hypertension     Past Surgical History:  Procedure Laterality Date   BACK SURGERY     cyst removal   IR CT HEAD LTD  05/07/2021   IR PERCUTANEOUS ART THROMBECTOMY/INFUSION INTRACRANIAL INC DIAG ANGIO  05/07/2021   RADIOLOGY WITH ANESTHESIA N/A 05/07/2021   Procedure: IR WITH ANESTHESIA;  Surgeon: Radiologist, Medication, MD;  [facility];  Service: Radiology;  Laterality: N/A;    There were no vitals filed for this visit.   Subjective Assessment - 09/09/21 1511     Subjective pleasant, accompanied by his daughter    Patient is accompained by: Family member    Currently in Pain? No/denies                   ADULT SLP TREATMENT - 09/09/21 0001       Treatment Provided   Treatment provided Cognitive-Linquistic      Cognitive-Linquistic Treatment   Treatment focused on Aphasia;Patient/family/caregiver education    Skilled Treatment LANGUAGE: AUDITORY COMPREHENSION - unable to perform basic 1 step audiotry direction improving to 100% with written direction; given  auditory direciton, pt able to selective written direction in field of 3 with 69% accuracy improving 100% with repetition; required moderate repeition cues to select sunctional biographical question from field of 2; accurate when selecting biographical information that was presented auditorially; TalkPath [employer] app - describing picture - 9 out of 10 improving to 10 out of 10 with repetion of items              SLP Education - 09/09/21 1518     Education Details receptive langauge deficits are improved when written information is present    Person(s) Educated Patient;Child(ren)    Methods Explanation;Demonstration;Verbal cues    Comprehension Verbalized understanding;Returned demonstration;Need further instruction              SLP Short Term Goals - 08/12/21 1018       SLP SHORT TERM GOAL #1   Title With moderate multi-modal cues, pt will communicate orientation information in 8 out of 10 opportunities.    Baseline unable at this time    Time 10    Period --   sessions   Status New      SLP SHORT TERM GOAL #2   Title With moderate multimodal assistance, pt will imitate 2 syllables containing bilabials with 80% accuracy in 8 out of  10 sessions.    Baseline unable at this time    Time 10    Period --   sessions   Status New      SLP SHORT TERM GOAL #3   Title With moderate multimodal cues, pt will follow 1-step directions with 80% accuracy in 8 out of 10 sessions.    Baseline unable at this time    Time 10    Period --   sessions   Status New      SLP SHORT TERM GOAL #4   Title Pt will scan to the right of midline to locate objects in 80% of opportunities across 5 sessions.    Baseline new goal    Time 10    Period --   sessions   Status New              SLP Long Term Goals - 08/12/21 1024       SLP LONG TERM GOAL #1   Title Pt will use multi-modal communication to communitcate basic wants and needs.    Baseline severe deficits    Time 12    Period  Weeks    Status New    Target Date 11/02/21              Plan - 09/09/21 1519     Clinical Impression Statement As pt has more islands of intelligible speech (several cohesive sentences together), it has become more apparent that pt has signfiicant deficits in receptive language, reading comprehension, letter recognition. Pt demonstrated increased awareness of paraphasic speech but was not able to produce accurately. Intensive skilled ST intervention is required to target these severe deficits to increase pt's functional independence and reduce caregiver burden.    Speech Therapy Frequency 2x / week    Duration 12 weeks    Treatment/Interventions Language facilitation;Cueing hierarchy;Multimodal communcation approach;Compensatory strategies;SLP instruction and feedback;Functional tasks;Patient/family education    Potential to Achieve Goals Good    SLP Home Exercise Plan provided, see pt instructions section    Consulted and Agree with Plan of Care Patient;Family member/caregiver    Family Member Consulted pt's daughter             Patient will benefit from skilled therapeutic intervention in order to improve the following deficits and impairments:   Aphasia  Cerebrovascular accident (CVA) due to thrombosis of left middle cerebral artery (HCC)  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 Dreama Saa M.S., [name], [facility] Speech-Language Pathologist Rehabilitation Services Office [phone]  Reuel Derby 09/09/2021, 3:20 PM  [facility] MAIN [facility] SERVICES [address] [place], [place], [address] Phone: [phone]   Fax:   [phone]   Name: Peter Lucas MRN: [id number] Date of Birth: Dec 16, 1965

## 2021-09-12 ENCOUNTER — Ambulatory Visit: Payer: BC Managed Care – PPO

## 2021-09-12 ENCOUNTER — Ambulatory Visit: Payer: BC Managed Care – PPO | Admitting: Speech Pathology

## 2021-09-12 ENCOUNTER — Other Ambulatory Visit: Payer: Self-pay

## 2021-09-12 DIAGNOSIS — I63312 Cerebral infarction due to thrombosis of left middle cerebral artery: Secondary | ICD-10-CM

## 2021-09-12 DIAGNOSIS — R4701 Aphasia: Secondary | ICD-10-CM

## 2021-09-12 DIAGNOSIS — I639 Cerebral infarction, unspecified: Secondary | ICD-10-CM

## 2021-09-13 NOTE — Therapy (Signed)
[facility] MAIN [facility] SERVICES [address] [place], [place], [address] Phone: [phone]   Fax:  [phone]  Speech Language Pathology Treatment  Patient Details  Name: Peter Lucas MRN: [id number] Date of Birth: [birth year] Referring Provider (SLP): Claudette Laws   Encounter Date: 09/12/2021   End of Session - 09/13/21 1030     Visit Number 9    Number of Visits 25    Date for SLP Re-Evaluation 11/02/21    Authorization Type [employer]    Authorization Time Period 08/10/2021 thru 11/02/2021    Authorization - Visit Number 9    Authorization - Number of Visits 34    Progress Note Due on Visit 10    SLP Start Time 1300    SLP Stop Time  1400    SLP Time Calculation (min) 60 min    Activity Tolerance Patient tolerated treatment well             Past Medical History:  Diagnosis Date   DM (diabetes mellitus) (HCC)    Hyperlipidemia    Hypertension     Past Surgical History:  Procedure Laterality Date   BACK SURGERY     cyst removal   IR CT HEAD LTD  05/07/2021   IR PERCUTANEOUS ART THROMBECTOMY/INFUSION INTRACRANIAL INC DIAG ANGIO  05/07/2021   RADIOLOGY WITH ANESTHESIA N/A 05/07/2021   Procedure: IR WITH ANESTHESIA;  Surgeon: Radiologist, Medication, MD;  [facility];  Service: Radiology;  Laterality: N/A;    There were no vitals filed for this visit.   Subjective Assessment - 09/13/21 1027     Subjective "I use to love playing golf, I am going to pluck this suck out, I missed the tee every time"  "Who won the ball game?"    Patient is accompained by: Family member    Currently in Pain? No/denies                   ADULT SLP TREATMENT - 09/13/21 0001       Treatment Provided   Treatment provided Cognitive-Linquistic      Cognitive-Linquistic Treatment   Treatment focused on Aphasia;Patient/family/caregiver education    Skilled Treatment Tactus Therapy app on iPad used to target multiple  skill areas: FOLLOWING DIRECTIONS - level 1 - 70% with repetition of audio - improving to 100% with written cues; CATEGORIZE ITEMS - level 1 - 70% improving to 80% with process of elimination of choices, COPYING - level 1 - 100%; SENTENCE COMPLETION - level 1 - unable to understand task d/t receptive language deficits; SPELLING - moderate assistance required to understand mechanics of task, accuracy improved to 80%              SLP Education - 09/13/21 1029     Education Details receptive language deficits    Person(s) Educated Patient;Child(ren)    Methods Explanation;Verbal cues    Comprehension Verbalized understanding;Need further instruction              SLP Short Term Goals - 08/12/21 1018       SLP SHORT TERM GOAL #1   Title With moderate multi-modal cues, pt will communicate orientation information in 8 out of 10 opportunities.    Baseline unable at this time    Time 10    Period --   sessions   Status New      SLP SHORT TERM GOAL #2   Title With moderate multimodal assistance, pt will imitate  2 syllables containing bilabials with 80% accuracy in 8 out of 10 sessions.    Baseline unable at this time    Time 10    Period --   sessions   Status New      SLP SHORT TERM GOAL #3   Title With moderate multimodal cues, pt will follow 1-step directions with 80% accuracy in 8 out of 10 sessions.    Baseline unable at this time    Time 10    Period --   sessions   Status New      SLP SHORT TERM GOAL #4   Title Pt will scan to the right of midline to locate objects in 80% of opportunities across 5 sessions.    Baseline new goal    Time 10    Period --   sessions   Status New              SLP Long Term Goals - 08/12/21 1024       SLP LONG TERM GOAL #1   Title Pt will use multi-modal communication to communitcate basic wants and needs.    Baseline severe deficits    Time 12    Period Weeks    Status New    Target Date 11/02/21              Plan -  09/13/21 1030     Clinical Impression Statement Pt and his daughter report that pt arrived early to church function, helped cookand clean up, was social despite language impairments, has gone to grocery store with his daughter, socialized with former employees. Pt continues with fluent unintelligible speech d/t receptive language impairments. Intensive skilled ST intervention is required to increase pt's functional independence and reduce caregiver burden.    Speech Therapy Frequency 2x / week    Duration 12 weeks    Treatment/Interventions Language facilitation;Cueing hierarchy;Multimodal communcation approach;Compensatory strategies;SLP instruction and feedback;Functional tasks;Patient/family education    Potential to Achieve Goals Good    SLP Home Exercise Plan provided, see pt instructions section    Consulted and Agree with Plan of Care Patient;Family member/caregiver    Family Member Consulted pt's daughter             Patient will benefit from skilled therapeutic intervention in order to improve the following deficits and impairments:   Aphasia  Cerebrovascular accident (CVA) due to thrombosis of left middle cerebral artery (HCC)  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 Dreama Saa M.S., [name], [facility] Speech-Language Pathologist Rehabilitation Services Office [phone]   Reuel Derby 09/13/2021, 10:37 AM  [place] MAIN [facility] SERVICES [address] [place], [place], [address] Phone: [phone]   Fax:  [phone]   Name: Peter Lucas MRN: [id number] Date of Birth: Jul 21, 1966

## 2021-09-13 NOTE — Patient Instructions (Signed)
Talk path therapy apps

## 2021-09-16 ENCOUNTER — Ambulatory Visit: Payer: BC Managed Care – PPO | Admitting: Speech Pathology

## 2021-09-16 ENCOUNTER — Ambulatory Visit: Payer: BC Managed Care – PPO

## 2021-09-16 ENCOUNTER — Other Ambulatory Visit: Payer: Self-pay

## 2021-09-16 DIAGNOSIS — R4701 Aphasia: Secondary | ICD-10-CM | POA: Diagnosis not present

## 2021-09-16 DIAGNOSIS — I63312 Cerebral infarction due to thrombosis of left middle cerebral artery: Secondary | ICD-10-CM

## 2021-09-16 DIAGNOSIS — I639 Cerebral infarction, unspecified: Secondary | ICD-10-CM

## 2021-09-19 NOTE — Therapy (Signed)
[place] MAIN [facility] SERVICES [address] [place], [place], [address] Phone: [phone]   Fax:  [phone]  Speech Language Pathology Treatment PROGRESS NOTE  Patient Details  Name: Peter Lucas MRN: [id number] Date of Birth: 02/23/1966 Referring Provider (SLP): Alysia Penna   Encounter Date: 09/16/2021   Speech Therapy Progress Note   Dates of Reporting Period: 08/10/2021 to 09/16/2021   Objective: Patient has been seen for 10 speech therapy sessions this reporting period targeting aphasia, reading and writing. Patient is making progress toward LTGs and met 3/4 STGs this reporting period. See skilled intervention, clinical impressions, and goals below for details.     End of Session - 09/19/21 1248     Visit Number 10    Number of Visits 25    Date for SLP Re-Evaluation 11/02/21    Authorization Type [employer]    Authorization Time Period 08/10/2021 thru 11/02/2021    Authorization - Visit Number 10    Authorization - Number of Visits 34    Progress Note Due on Visit 10    SLP Start Time 0900    SLP Stop Time  1000    SLP Time Calculation (min) 60 min    Activity Tolerance Patient tolerated treatment well             Past Medical History:  Diagnosis Date   DM (diabetes mellitus) ([place])    Hyperlipidemia    Hypertension     Past Surgical History:  Procedure Laterality Date   BACK SURGERY     cyst removal   IR CT HEAD LTD  05/07/2021   IR PERCUTANEOUS ART THROMBECTOMY/INFUSION INTRACRANIAL INC DIAG ANGIO  05/07/2021   RADIOLOGY WITH ANESTHESIA N/A 05/07/2021   Procedure: IR WITH ANESTHESIA;  Surgeon: Radiologist, Medication, MD;  Location: [place];  Service: Radiology;  Laterality: N/A;    There were no vitals filed for this visit.   Subjective Assessment - 09/19/21 0911     Subjective "that's crazy, that isn't what I am hearing" referring to receptive language deficits    Patient is accompained by:  Family member    Currently in Pain? No/denies                   ADULT SLP TREATMENT - 09/19/21 0001       Treatment Provided   Treatment provided Cognitive-Linquistic      Cognitive-Linquistic Treatment   Treatment focused on Aphasia;Patient/family/caregiver education    Skilled Treatment Tactus Therapy app on iPad used to target multiple skill areas: SPELLING - (level 1) - 60% improving to 100% with hint of first letter;  FOLLOWING DIRECTIONS - (level 1) - 90 CATEGORIZE ITEMS - level 1 - 70% improving to 80% with process of elimination of choices, COPYING - level 1 - 100%; SENTENCE COMPLETION - level 1 - unable to understand task d/t receptive language deficits; SPELLING - moderate assistance required to understand mechanics of task, accuracy improved to 80%                SLP Short Term Goals - 09/19/21 1251       SLP SHORT TERM GOAL #1   Title With moderate multi-modal cues, pt will communicate orientation information in 8 out of 10 opportunities.    Time 10    Period --   sessions   Status On-going      SLP SHORT TERM GOAL #2   Title With moderate multimodal assistance, pt will imitate  2 syllables containing bilabials with 80% accuracy in 8 out of 10 sessions.    Time 10    Period --   sessions   Status On-going      SLP SHORT TERM GOAL #3   Title With moderate multimodal cues, pt will follow 1-step directions with 80% accuracy in 8 out of 10 sessions.    Time 10    Period --   sessions   Status On-going      SLP SHORT TERM GOAL #4   Title Pt will scan to the right of midline to locate objects in 80% of opportunities across 5 sessions.    Status Achieved              SLP Long Term Goals - 09/19/21 1253       SLP LONG TERM GOAL #1   Title Pt will use multi-modal communication to communitcate basic wants and needs.    Status On-going    Target Date 11/02/21              Plan - 09/19/21 1249     Clinical Impression Statement Pt continues  to present with moderate to severe receptive language deficits that prevent him from understanding spoken language as well as prevent him from decreaseing semantic paraphasias and perseveration. Skilled ST intervention is required to target these impairments to increase pt's functional independence and reduce caregiver burden.    Speech Therapy Frequency 2x / week    Duration 12 weeks    Treatment/Interventions Language facilitation;Cueing hierarchy;Multimodal communcation approach;Compensatory strategies;SLP instruction and feedback;Functional tasks;Patient/family education    Potential to Achieve Goals Good    Consulted and Agree with Plan of Care Patient;Family member/caregiver    Family Member Consulted pt's sister             Patient will benefit from skilled therapeutic intervention in order to improve the following deficits and impairments:   Aphasia  Cerebrovascular accident (CVA) due to thrombosis of left middle cerebral artery ([place])  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 Rutherford Nail M.S., [name], [place] Pathologist Rehabilitation Services Office [phone]  [name] 09/19/2021, 12:54 PM  [place] MAIN [facility] SERVICES [address] [place], [place], [address] Phone: [phone]   Fax:  [phone]   Name: FABRICE DYAL MRN: [id number] Date of Birth: 1966-06-29

## 2021-09-20 ENCOUNTER — Other Ambulatory Visit: Payer: Self-pay

## 2021-09-20 ENCOUNTER — Ambulatory Visit: Payer: BC Managed Care – PPO

## 2021-09-20 ENCOUNTER — Ambulatory Visit: Payer: BC Managed Care – PPO | Admitting: Speech Pathology

## 2021-09-20 ENCOUNTER — Encounter: Payer: BC Managed Care – PPO | Admitting: Speech Pathology

## 2021-09-20 DIAGNOSIS — R4701 Aphasia: Secondary | ICD-10-CM

## 2021-09-20 DIAGNOSIS — I639 Cerebral infarction, unspecified: Secondary | ICD-10-CM

## 2021-09-20 DIAGNOSIS — I63312 Cerebral infarction due to thrombosis of left middle cerebral artery: Secondary | ICD-10-CM

## 2021-09-20 NOTE — Therapy (Signed)
[place] [facility] MAIN [facility] SERVICES [address] [place], [place], [address] Phone: [phone]   Fax:  [phone]  Speech Language Pathology Treatment  Patient Details  Name: Peter Lucas MRN: [id number] Date of Birth: 08-May-1966 Referring Provider (SLP): Claudette Laws   Encounter Date: 09/20/2021   End of Session - 09/20/21 1439     Visit Number 11    Number of Visits 25    Date for SLP Re-Evaluation 11/02/21    Authorization Type [employer]    Authorization Time Period 08/10/2021 thru 11/02/2021    Authorization - Visit Number 1    Authorization - Number of Visits 34    Progress Note Due on Visit 10    SLP Start Time 0830    SLP Stop Time  1000    SLP Time Calculation (min) 90 min    Activity Tolerance Patient tolerated treatment well             Past Medical History:  Diagnosis Date   DM (diabetes mellitus) (HCC)    Hyperlipidemia    Hypertension     Past Surgical History:  Procedure Laterality Date   BACK SURGERY     cyst removal   IR CT HEAD LTD  05/07/2021   IR PERCUTANEOUS ART THROMBECTOMY/INFUSION INTRACRANIAL INC DIAG ANGIO  05/07/2021   RADIOLOGY WITH ANESTHESIA N/A 05/07/2021   Procedure: IR WITH ANESTHESIA;  Surgeon: Radiologist, Medication, MD;  Location: [facility] OR;  Service: Radiology;  Laterality: N/A;    There were no vitals filed for this visit.   Subjective Assessment - 09/20/21 1420     Subjective "I can't believe that is not what I am hearing"    Patient is accompained by: Family member    Currently in Pain? No/denies                   ADULT SLP TREATMENT - 09/20/21 0001       Treatment Provided   Treatment provided Cognitive-Linquistic      Cognitive-Linquistic Treatment   Treatment focused on Aphasia;Patient/family/caregiver education    Skilled Treatment Pt is using Electrical engineer app very proficiently; downloaded [employer] on pt's phone, he appeared to enjoy exploring  the different icons, increased difficulty noted when pt asked to press specific icons to convey message, counting task              SLP Education - 09/20/21 1438     Education Details use of apps for expressive communication    Person(s) Educated Patient   sister   Methods Explanation;Demonstration;Verbal cues    Comprehension Verbalized understanding;Need further instruction              SLP Short Term Goals - 09/19/21 1251       SLP SHORT TERM GOAL #1   Title With moderate multi-modal cues, pt will communicate orientation information in 8 out of 10 opportunities.    Time 10    Period --   sessions   Status On-going      SLP SHORT TERM GOAL #2   Title With moderate multimodal assistance, pt will imitate 2 syllables containing bilabials with 80% accuracy in 8 out of 10 sessions.    Time 10    Period --   sessions   Status On-going      SLP SHORT TERM GOAL #3   Title With moderate multimodal cues, pt will follow 1-step directions with 80% accuracy in 8 out of 10 sessions.  Time 10    Period --   sessions   Status On-going      SLP SHORT TERM GOAL #4   Title Pt will scan to the right of midline to locate objects in 80% of opportunities across 5 sessions.    Status Achieved              SLP Long Term Goals - 09/19/21 1253       SLP LONG TERM GOAL #1   Title Pt will use multi-modal communication to communitcate basic wants and needs.    Status On-going    Target Date 11/02/21              Plan - 09/20/21 1442     Clinical Impression Statement Pt continues to present with moderate to severe receptive language deficits that prevent him from understanding spoken language as well as prevent him from decreaseing semantic paraphasias and perseveration. Skilled ST intervention is required to target these impairments to increase pt's functional independence and reduce caregiver burden.    Speech Therapy Frequency 2x / week    Duration 12 weeks     Treatment/Interventions Language facilitation;Cueing hierarchy;Multimodal communcation approach;Compensatory strategies;SLP instruction and feedback;Functional tasks;Patient/family education    Potential to Achieve Goals Good    Consulted and Agree with Plan of Care Patient;Family member/caregiver    Family Member Consulted pt's sister             Patient will benefit from skilled therapeutic intervention in order to improve the following deficits and impairments:   Aphasia  Cerebrovascular accident (CVA) due to thrombosis of left middle cerebral artery (HCC)  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 Dreama Saa M.S., [name], [facility] Speech-Language Pathologist Rehabilitation Services Office [phone]  Reuel Derby 09/20/2021, 2:42 PM  [place] [facility] MAIN [facility] SERVICES [address] [place], [place], [address] Phone: [phone]   Fax:  [phone]   Name: Peter Lucas MRN: [id number] Date of Birth: [birth year]

## 2021-09-23 ENCOUNTER — Ambulatory Visit: Payer: BC Managed Care – PPO | Admitting: Speech Pathology

## 2021-09-23 ENCOUNTER — Ambulatory Visit: Payer: BC Managed Care – PPO

## 2021-09-23 ENCOUNTER — Other Ambulatory Visit: Payer: Self-pay

## 2021-09-23 DIAGNOSIS — I63312 Cerebral infarction due to thrombosis of left middle cerebral artery: Secondary | ICD-10-CM

## 2021-09-23 DIAGNOSIS — R4701 Aphasia: Secondary | ICD-10-CM | POA: Diagnosis not present

## 2021-09-23 DIAGNOSIS — I639 Cerebral infarction, unspecified: Secondary | ICD-10-CM

## 2021-09-23 NOTE — Therapy (Signed)
[facility] MAIN [facility] SERVICES [address] [place], [place], [address] Phone: [phone]   Fax:  [phone]  Speech Language Pathology Treatment  Patient Details  Name: Peter Lucas MRN: [id number] Date of Birth: 1966-03-17 Referring Provider (SLP): Claudette Laws   Encounter Date: 09/23/2021   End of Session - 09/23/21 1217     Visit Number 12    Number of Visits 25    Date for SLP Re-Evaluation 11/02/21    Authorization Type [employer]    Authorization Time Period 08/10/2021 thru 11/02/2021    Authorization - Visit Number 2    Authorization - Number of Visits 34    Progress Note Due on Visit 10    SLP Start Time 0900    SLP Stop Time  1000    SLP Time Calculation (min) 60 min    Activity Tolerance Patient tolerated treatment well             Past Medical History:  Diagnosis Date   DM (diabetes mellitus) (HCC)    Hyperlipidemia    Hypertension     Past Surgical History:  Procedure Laterality Date   BACK SURGERY     cyst removal   IR CT HEAD LTD  05/07/2021   IR PERCUTANEOUS ART THROMBECTOMY/INFUSION INTRACRANIAL INC DIAG ANGIO  05/07/2021   RADIOLOGY WITH ANESTHESIA N/A 05/07/2021   Procedure: IR WITH ANESTHESIA;  Surgeon: Radiologist, Medication, MD;  [facility] OR;  Service: Radiology;  Laterality: N/A;    There were no vitals filed for this visit.   Subjective Assessment - 09/23/21 1211     Subjective "I ran into pastor"    Patient is accompained by: Family member    Currently in Pain? No/denies                   ADULT SLP TREATMENT - 09/23/21 0001       Treatment Provided   Treatment provided Cognitive-Linquistic      Cognitive-Linquistic Treatment   Treatment focused on Aphasia;Patient/family/caregiver education    Skilled Treatment RECEPTIVE LANGUAGE: selecting coins from field of 4 - 50% improving to 100% with visual articulatory cues from SLP; selecting colors from field of 4 -  100% with visual articulatory cues from SLP; counting specific number of pennies - 75% improving to 100% with visual articulatory cues from SLP; EXPRESSIVE LANGUAGE - much improved when watching SLP's visual articulatory cues - when not looking at SLP pt frequently said the wrong word and that also resulted in inability to demonstrate auditory understanding              SLP Education - 09/23/21 1216     Education Details multi-modal cuing - need for visual articulatory cues    Person(s) Educated Patient;Other (comment)   sister   Methods Explanation;Demonstration;Verbal cues    Comprehension Verbalized understanding;Need further instruction              SLP Short Term Goals - 09/19/21 1251       SLP SHORT TERM GOAL #1   Title With moderate multi-modal cues, pt will communicate orientation information in 8 out of 10 opportunities.    Time 10    Period --   sessions   Status On-going      SLP SHORT TERM GOAL #2   Title With moderate multimodal assistance, pt will imitate 2 syllables containing bilabials with 80% accuracy in 8 out of 10 sessions.    Time 10  Period --   sessions   Status On-going      SLP SHORT TERM GOAL #3   Title With moderate multimodal cues, pt will follow 1-step directions with 80% accuracy in 8 out of 10 sessions.    Time 10    Period --   sessions   Status On-going      SLP SHORT TERM GOAL #4   Title Pt will scan to the right of midline to locate objects in 80% of opportunities across 5 sessions.    Status Achieved              SLP Long Term Goals - 09/19/21 1253       SLP LONG TERM GOAL #1   Title Pt will use multi-modal communication to communitcate basic wants and needs.    Status On-going    Target Date 11/02/21              Plan - 09/23/21 1217     Clinical Impression Statement Pt continues to present with moderate to severe receptive language deficits that prevent him from understanding spoken language as well as prevent  him from decreasing semantic paraphasias and perseveration. Skilled ST intervention is required to target these impairments to increase pt's functional independence and reduce caregiver burden.    Speech Therapy Frequency 2x / week    Duration 12 weeks    Treatment/Interventions Language facilitation;Cueing hierarchy;Multimodal communcation approach;Compensatory strategies;SLP instruction and feedback;Functional tasks;Patient/family education    Potential to Achieve Goals Good    Potential Considerations Severity of impairments    Consulted and Agree with Plan of Care Patient;Family member/caregiver    Family Member Consulted pt's sister             Patient will benefit from skilled therapeutic intervention in order to improve the following deficits and impairments:   Aphasia  Cerebrovascular accident (CVA) due to thrombosis of left middle cerebral artery (HCC)  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 Dreama Saa M.S., [name], [facility] Speech-Language Pathologist Rehabilitation Services Office [phone]  Reuel Derby 09/23/2021, 12:18 PM  [place] [facility] MAIN [facility] SERVICES [address] [place], [place], [address] Phone: [phone]   Fax:  [phone]   Name: Peter Lucas MRN: [id number] Date of Birth: 1966-06-03

## 2021-09-26 ENCOUNTER — Other Ambulatory Visit: Payer: Self-pay

## 2021-09-26 ENCOUNTER — Ambulatory Visit: Payer: BC Managed Care – PPO | Admitting: Speech Pathology

## 2021-09-26 ENCOUNTER — Ambulatory Visit: Payer: BC Managed Care – PPO

## 2021-09-26 DIAGNOSIS — I63312 Cerebral infarction due to thrombosis of left middle cerebral artery: Secondary | ICD-10-CM

## 2021-09-26 DIAGNOSIS — R4701 Aphasia: Secondary | ICD-10-CM

## 2021-09-26 DIAGNOSIS — I639 Cerebral infarction, unspecified: Secondary | ICD-10-CM

## 2021-09-26 NOTE — Patient Instructions (Signed)
TalkPath Therapy Counting post-it notes

## 2021-09-26 NOTE — Therapy (Signed)
[place] MAIN [facility] SERVICES [address] [place], [place], [address] Phone: [phone]   Fax:  [phone]  Speech Language Pathology Treatment  Patient Details  Name: Peter Lucas MRN: [id number] Date of Birth: May 18, 1966 Referring Provider (SLP): Alysia Penna   Encounter Date: 09/26/2021   End of Session - 09/26/21 1646     Visit Number 13    Number of Visits 25    Date for SLP Re-Evaluation 11/02/21    Authorization Type [employer]    Authorization Time Period 08/10/2021 thru 11/02/2021    Authorization - Visit Number 3    Progress Note Due on Visit 10    SLP Start Time 0900    SLP Stop Time  1000    SLP Time Calculation (min) 60 min    Activity Tolerance Patient tolerated treatment well             Past Medical History:  Diagnosis Date   DM (diabetes mellitus) ([place])    Hyperlipidemia    Hypertension     Past Surgical History:  Procedure Laterality Date   BACK SURGERY     cyst removal   IR CT HEAD LTD  05/07/2021   IR PERCUTANEOUS ART THROMBECTOMY/INFUSION INTRACRANIAL INC DIAG ANGIO  05/07/2021   RADIOLOGY WITH ANESTHESIA N/A 05/07/2021   Procedure: IR WITH ANESTHESIA;  Surgeon: Radiologist, Medication, MD;  Location: [place];  Service: Radiology;  Laterality: N/A;    There were no vitals filed for this visit.   Subjective Assessment - 09/26/21 1640     Subjective "I love this thing" referring to aphasia app on his phone - he uses to joke with his sons    Patient is accompained by: Family member    Currently in Pain? No/denies                   ADULT SLP TREATMENT - 09/26/21 0001       Treatment Provided   Treatment provided Cognitive-Linquistic      Cognitive-Linquistic Treatment   Treatment focused on Aphasia;Patient/family/caregiver education    Skilled Treatment RECEPTIVE LANGUAGE: selecting written numbers in field of 1 to 10 - 50% improving to 100% when looking at SLP's  articulatory cues and verbal; providing requested numbers of pennies from 1 to 15 - 100% with minimal cues; 100% selecting all the dimes, pennies etc from field of 30 coins; selecting number of specified coins - 100% and using coins to make specified amount based on visual and verbal - 100%EXPRESSIVE LANGUAGE - noted improvement throughout all tasks during session; much less impulsive in guessing which word to say resulting in greater expressive ability              SLP Education - 09/26/21 1645     Education Details aphasia cards, medical alert bracelet, notating emergency contacts on his cell phone    Person(s) Educated Patient;Child(ren)   pt's daughter             SLP Short Term Goals - 09/26/21 1649       SLP SHORT TERM GOAL #1   Title The pt will answer simple biographical and orientation yes/no questions presented auditorily at 80% accuracy given moderate visual cues.    Baseline goal met, revised to reflect pt progress    Time 10    Period --   sessions   Status Revised      SLP SHORT TERM GOAL #2   Title The pt will  improve speech approximation of rote information such as MOY, DOW and address to ~ 75% intelligibility with moderate multi-modal cues.    Baseline goal revised to reflect progress    Time 10    Period --   sessions   Status Revised      SLP SHORT TERM GOAL #3   Title The pt will identify the correct word given 2 choices at 80% accuracy given frequent visual cues in order to increase ability to comprehend simple instructions.    Baseline goal revised to reflect functionality    Time 10    Period --   sessions   Status Revised      SLP SHORT TERM GOAL #4   Title The pt will identify body parts at 80% accuracy given minimal cues.    Baseline goal revised to reflect progress    Time 10    Period --   sessions   Status Revised      SLP SHORT TERM GOAL #5   Title The pt will identify the correct picture in a field of 4 when present with the word  auditorily at 80% accuracy given minimal cues.    Baseline new goal    Time 10    Period --   sessions   Status Revised              SLP Long Term Goals - 09/26/21 1701       SLP LONG TERM GOAL #1   Title Pt will use multi-modal communication to communitcate basic wants and needs.    Status On-going    Target Date 11/02/21              Plan - 09/26/21 1647     Clinical Impression Statement Pt presents with improving aphasia as evidenced by his improving receptive language abilities as well as the frequency and duration of his islands of intelligible speech. Pt has demonstrated great understanding and use of aphasia communication app on his phone and reading continues to be a strength for pt. Skilled ST intervention continues to be required to target pt's moderate aphasia and increase pt's functional independence thereby reducing caregiver burden.    Speech Therapy Frequency 2x / week    Duration 12 weeks    Treatment/Interventions Language facilitation;Cueing hierarchy;Multimodal communication approach;Compensatory strategies;SLP instruction and feedback;Functional tasks;Patient/family education    Potential to Achieve Goals Good    SLP Home Exercise Plan provided, see pt instructions section    Consulted and Agree with Plan of Care Patient;Family member/caregiver    Family Member Consulted pt's daughter             Patient will benefit from skilled therapeutic intervention in order to improve the following deficits and impairments:   Aphasia  Cerebrovascular accident (CVA) due to thrombosis of left middle cerebral artery ([place])  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 Rutherford Nail M.S., [name], [place] Pathologist Rehabilitation Services Office [phone]  [name] 09/26/2021, [place] MAIN [facility] SERVICES [address] [place], [place], [address] Phone: [phone]   Fax:  [phone]   Name: Peter Lucas MRN: [id number] Date of Birth: 12-18-1965

## 2021-09-27 ENCOUNTER — Encounter
Payer: BC Managed Care – PPO | Attending: Physical Medicine & Rehabilitation | Admitting: Physical Medicine & Rehabilitation

## 2021-09-27 ENCOUNTER — Encounter: Payer: Self-pay | Admitting: Physical Medicine & Rehabilitation

## 2021-09-27 VITALS — BP 174/108 | HR 66 | Ht 72.0 in | Wt 279.0 lb

## 2021-09-27 DIAGNOSIS — I6602 Occlusion and stenosis of left middle cerebral artery: Secondary | ICD-10-CM | POA: Diagnosis not present

## 2021-09-27 DIAGNOSIS — R4701 Aphasia: Secondary | ICD-10-CM | POA: Insufficient documentation

## 2021-09-27 NOTE — Progress Notes (Signed)
Subjective:    Patient ID: Peter Lucas, male    DOB: 08-20-1966, 55 y.o.   MRN: [id number] 55 y.o. right-handed male with unremarkable past medical history who has not seen a PCP for many years with history of tobacco abuse obesity BMI 34.28 on no prescription medications.  Per chart review patient lives alone 1 level home and independent prior to admission.  He has good family support.  Presented 05/07/2021 with acute onset of aphasia and right side weakness.  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  No hemorrhagic transformation.  Patient did not receive tPA.  CTA of the head and neck positive for occlusion of the posterior left MCA branch distal M2 versus proximal M3.  Patient underwent thrombectomy per interventional radiology.  Latest cranial CT scan showed more pronounced low density within the left MCA branch vessel infarction.  No midline shift or hemorrhage.  Echocardiogram ejection fraction of 55 to 60% no wall motion abnormalities.  Admission chemistries unremarkable except glucose 185 alcohol negative WBC 11,100.  HPI 55 year old male returns to clinic with primary concern of speech problems.  Patient continues to follow-up with speech therapy in [place], [facility]..  He is accompanied by his sister who is driving him.  He is independent with all self-care and mobility but has difficulty with comprehension greater than with expression although both are affected. In addition patient sister states that he is able to understand written language better than spoken. His long-term goals are to return to driving as well as return to work Pain Inventory Average Pain 0 Pain Right Now 0 My pain is  no pain  LOCATION OF PAIN  no pain  BOWEL Number of stools per week: 7 Oral laxative use No  Type of laxative na Enema or suppository use No  History of colostomy No  Incontinent No   BLADDER Normal In and out cath, frequency  na Able to self cath  na Bladder incontinence No  Frequent urination No  Leakage with coughing No  Difficulty starting stream No  Incomplete bladder emptying No    Mobility walk without assistance  Function not employed: date last employed 05/07/21  Neuro/Psych No problems in this area  Prior Studies Any changes since last visit?  no  Physicians involved in your care Any changes since last visit?  no   Family History  Problem Relation Age of Onset   Heart failure Mother    Stroke Mother    Social History   Socioeconomic History   Marital status: Unknown    Spouse name: Not on file   Number of children: Not on file   Years of education: Not on file   Highest education level: Not on file  Occupational History   Not on file  Tobacco Use   Smoking status: Former    Types: Cigarettes   Smokeless tobacco: Never  Vaping Use   Vaping Use: Never used  Substance and Sexual Activity   Alcohol use: Not Currently   Drug use: Never   Sexual activity: Not on file  Other Topics Concern   Not on file  Social History Narrative   Not on file   Social Determinants of Health   Financial Resource Strain: Not on file  Food Insecurity: Not on file  Transportation Needs: Not on file  Physical Activity: Not on file  Stress: Not on file  Social Connections: Not on file   Past Surgical History:  Procedure Laterality  Date   BACK SURGERY     cyst removal   IR CT HEAD LTD  05/07/2021   IR PERCUTANEOUS ART THROMBECTOMY/INFUSION INTRACRANIAL INC DIAG ANGIO  05/07/2021   RADIOLOGY WITH ANESTHESIA N/A 05/07/2021   Procedure: IR WITH ANESTHESIA;  Surgeon: Radiologist, Medication, MD;  [facility];  Service: Radiology;  Laterality: N/A;   Past Medical History:  Diagnosis Date   DM (diabetes mellitus) (HCC)    Hyperlipidemia    Hypertension    BP (!) 174/108   Pulse 66   Ht 6' (1.829 m)   Wt 279 lb (126.6 kg)   SpO2 98%   BMI 37.84 kg/m   Opioid Risk Score:   Fall Risk  Score:  `1  Depression screen PHQ 2/9  Depression screen PHQ 2/9 06/08/2021  Decreased Interest 1  Down, Depressed, Hopeless 0  PHQ - 2 Score 1  Altered sleeping 0  Tired, decreased energy 0  Change in appetite 0  Feeling bad or failure about yourself  0  Trouble concentrating 1  Moving slowly or fidgety/restless 0  Suicidal thoughts 0  PHQ-9 Score 2      Review of Systems  All other systems reviewed and are negative.     Objective:   Physical Exam HENT:     Head: Normocephalic and atraumatic.  Neurological:     Mental Status: He is alert.     Cranial Nerves: No dysarthria or facial asymmetry.     Motor: No tremor, atrophy or abnormal muscle tone.    Obese male no acute distress Mood and affect are appropriate Speech is able to follow simple commands such as raise her arm but has more difficulty with multistep commands he is able to identify right ear left ear nose and right knee left knee.  He is able to print his name.  He is able to close his eyes to command. Motor strength is 5/5 bilateral deltoid bicep tricep grip hip flexion knee extension ankle dorsiflexion Sensation difficult to evaluate secondary to language Ambulates without assist device no evidence of toe drag or knee instability      Assessment & Plan:   1.  Left into branch inferior MCA infarct with primary residual deficits of fluent aphasia, apraxia.  We discussed timeline of recovery approximately 1 year.  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  He is likely to be able to return to driving at this point I would hold off given his apraxia.  Return to physical medicine clinic to reassess after 6 months.  Continue outpatient speech therapy.  Patient may benefit from vocational rehab

## 2021-09-27 NOTE — Patient Instructions (Signed)
No driving or return to work , will evaluate at next visit

## 2021-09-30 ENCOUNTER — Ambulatory Visit: Payer: BC Managed Care – PPO | Admitting: Speech Pathology

## 2021-09-30 ENCOUNTER — Other Ambulatory Visit: Payer: Self-pay

## 2021-09-30 ENCOUNTER — Ambulatory Visit: Payer: BC Managed Care – PPO

## 2021-09-30 DIAGNOSIS — R4701 Aphasia: Secondary | ICD-10-CM | POA: Diagnosis not present

## 2021-09-30 DIAGNOSIS — I63512 Cerebral infarction due to unspecified occlusion or stenosis of left middle cerebral artery: Secondary | ICD-10-CM

## 2021-09-30 DIAGNOSIS — R41841 Cognitive communication deficit: Secondary | ICD-10-CM

## 2021-09-30 NOTE — Therapy (Signed)
[place] MAIN [facility] SERVICES [address] [place], [place], [address] Phone: [phone]   Fax:  [phone]  Speech Language Pathology Treatment  Patient Details  Name: Peter Lucas MRN: [id number] Date of Birth: 1965-12-05 Referring Provider (SLP): Alysia Penna   Encounter Date: 09/30/2021   End of Session - 09/30/21 1209     Visit Number 14    Number of Visits 25    Date for SLP Re-Evaluation 11/02/21    Authorization Type [employer]    Authorization Time Period 08/10/2021 thru 11/02/2021    Authorization - Visit Number 4    Authorization - Number of Visits 34    Progress Note Due on Visit 10    SLP Start Time 0900    SLP Stop Time  1000    SLP Time Calculation (min) 60 min    Activity Tolerance Patient tolerated treatment well             Past Medical History:  Diagnosis Date   DM (diabetes mellitus) ([place])    Hyperlipidemia    Hypertension     Past Surgical History:  Procedure Laterality Date   BACK SURGERY     cyst removal   IR CT HEAD LTD  05/07/2021   IR PERCUTANEOUS ART THROMBECTOMY/INFUSION INTRACRANIAL INC DIAG ANGIO  05/07/2021   RADIOLOGY WITH ANESTHESIA N/A 05/07/2021   Procedure: IR WITH ANESTHESIA;  Surgeon: Radiologist, Medication, MD;  Location: [place];  Service: Radiology;  Laterality: N/A;    There were no vitals filed for this visit.   Subjective Assessment - 09/30/21 1151     Subjective "I'm alright, its goin" re: speech/communication, patient actively engaged and motivated throughout session. Sister Freada Bergeron present throughout    Patient is accompained by: Family member    Currently in Pain? No/denies                   ADULT SLP TREATMENT - 09/30/21 0001       General Information   Behavior/Cognition Alert;Pleasant mood;Cooperative      Treatment Provided   Treatment provided Cognitive-Linquistic      Cognitive-Linquistic Treatment   Treatment focused on Aphasia     Skilled Treatment Patient demonstrated auditory comrehension for simple biographical information with 0% IND thhough 50% accuracy x8 given a writtent cue, improved further to 75% accuracy with a visual cue for x2 questions(looking at speakers lips when asking question) and improved to 100 % accuracy with a repetition on x2 quesitons. Patient demonstrated auditory comprehension with appropriate responses for simple biographical information re: family members with 75% accuracy x8 given moderate assistance with organized writing. Patient demonstrated improved expression of abstract concepts re: history and recent events with only mild assistance from communication partner for communicatory success.              SLP Education - 09/30/21 1208     Education Details strategies, progress    Person(s) Educated Patient    Methods Explanation;Demonstration    Comprehension Verbalized understanding;Need further instruction              SLP Short Term Goals - 09/26/21 1649       SLP SHORT TERM GOAL #1   Title The pt will answer simple biographical and orientation yes/no questions presented auditorily at 80% accuracy given moderate visual cues.    Baseline goal met, revised to reflect pt progress    Time 10    Period --   sessions  Status Revised      SLP SHORT TERM GOAL #2   Title The pt will improve speech approximation of rote information such as MOY, DOW and address to ~ 75% intelligibility with moderate multi-modal cues.    Baseline goal revised to reflect progress    Time 10    Period --   sessions   Status Revised      SLP SHORT TERM GOAL #3   Title The pt will identify the correct word given 2 choices at 80% accuracy given frequent visual cues in order to increase ability to comprehend simple instructions.    Baseline goal revised to reflect functionality    Time 10    Period --   sessions   Status Revised      SLP SHORT TERM GOAL #4   Title The pt will identify body parts at  80% accuracy given minimal cues.    Baseline goal revised to reflect progress    Time 10    Period --   sessions   Status Revised      SLP SHORT TERM GOAL #5   Title The pt will identify the correct picture in a field of 4 when present with the word auditorily at 80% accuracy given minimal cues.    Baseline new goal    Time 10    Period --   sessions   Status Revised              SLP Long Term Goals - 09/26/21 1701       SLP LONG TERM GOAL #1   Title Pt will use multi-modal communication to communitcate basic wants and needs.    Status On-going    Target Date 11/02/21              Plan - 09/30/21 1209     Clinical Impression Statement Pt presents with improving aphasia as evidenced by his improving receptive language abilities as well as the frequency and duration of his islands of intelligible speech. Pt has demonstrated great understanding and use of aphasia communication app on his phone and reading continues to be a strength for pt. Skilled ST intervention continues to be required to target pt's moderate aphasia and increase pt's functional independence thereby reducing caregiver burden.    Speech Therapy Frequency 2x / week    Duration 12 weeks    Treatment/Interventions Language facilitation;Cueing hierarchy;Multimodal communcation approach;Compensatory strategies;SLP instruction and feedback;Functional tasks;Patient/family education    Potential to Achieve Goals Good    Potential Considerations Severity of impairments    SLP Home Exercise Plan provided, see pt instructions section    Consulted and Agree with Plan of Care Patient;Family member/caregiver    Family Member Consulted pt's sister             Patient will benefit from skilled therapeutic intervention in order to improve the following deficits and impairments:   Aphasia  Acute ischemic left MCA stroke [facility])  Cognitive communication deficit    Problem List Patient Active Problem List    Diagnosis Date Noted   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 [name]   [place] 09/30/2021, 12:11 PM  [place] MAIN [facility] SERVICES [address] [place], [place], [address] Phone: [phone]   Fax:  [phone]   Name:  BARBARA KENG MRN: [id number] Date of Birth: 14-Jul-1966

## 2021-09-30 NOTE — Patient Instructions (Signed)
Ask communication partners to "write it down" to assist with verbal conversation. Notecard given as written reminder for this compensatory strategy.

## 2021-10-04 ENCOUNTER — Other Ambulatory Visit: Payer: Self-pay

## 2021-10-04 ENCOUNTER — Ambulatory Visit: Payer: BC Managed Care – PPO | Attending: Physical Medicine & Rehabilitation | Admitting: Speech Pathology

## 2021-10-04 ENCOUNTER — Ambulatory Visit: Payer: BC Managed Care – PPO | Admitting: Physical Therapy

## 2021-10-04 DIAGNOSIS — R4701 Aphasia: Secondary | ICD-10-CM | POA: Insufficient documentation

## 2021-10-04 DIAGNOSIS — I63312 Cerebral infarction due to thrombosis of left middle cerebral artery: Secondary | ICD-10-CM | POA: Insufficient documentation

## 2021-10-04 DIAGNOSIS — I639 Cerebral infarction, unspecified: Secondary | ICD-10-CM | POA: Diagnosis present

## 2021-10-04 DIAGNOSIS — I63512 Cerebral infarction due to unspecified occlusion or stenosis of left middle cerebral artery: Secondary | ICD-10-CM | POA: Diagnosis present

## 2021-10-04 NOTE — Patient Instructions (Signed)
Utilizing communication reparation strategies: "repeat it" first, then "write it" to increase independence in communication and increase communicatory success

## 2021-10-04 NOTE — Therapy (Signed)
[place] MAIN [facility] SERVICES [address] [place], [place], [address] Phone: [phone]   Fax:  [phone]  Speech Language Pathology Treatment  Patient Details  Name: Peter Lucas MRN: [id number] Date of Birth: 07-13-1966 Referring Provider (SLP): Alysia Penna   Encounter Date: 10/04/2021   End of Session - 10/04/21 1259     Visit Number 15    Number of Visits 25    Date for SLP Re-Evaluation 11/02/21    Authorization Type [employer]    Authorization Time Period 08/10/2021 thru 11/02/2021    Authorization - Visit Number 5    Authorization - Number of Visits 34    Progress Note Due on Visit 10    SLP Start Time 1000    SLP Stop Time  1100    SLP Time Calculation (min) 60 min    Activity Tolerance Patient tolerated treatment well             Past Medical History:  Diagnosis Date   DM (diabetes mellitus) ([place])    Hyperlipidemia    Hypertension     Past Surgical History:  Procedure Laterality Date   BACK SURGERY     cyst removal   IR CT HEAD LTD  05/07/2021   IR PERCUTANEOUS ART THROMBECTOMY/INFUSION INTRACRANIAL INC DIAG ANGIO  05/07/2021   RADIOLOGY WITH ANESTHESIA N/A 05/07/2021   Procedure: IR WITH ANESTHESIA;  Surgeon: Radiologist, Medication, MD;  Location: [place];  Service: Radiology;  Laterality: N/A;    There were no vitals filed for this visit.   Subjective Assessment - 10/04/21 1021     Subjective "I went to the doctor yesterday    Patient is accompained by: Family member    Currently in Pain? No/denies                   ADULT SLP TREATMENT - 10/04/21 0001       General Information   Behavior/Cognition Alert;Pleasant mood;Cooperative      Treatment Provided   Treatment provided Cognitive-Linquistic      Cognitive-Linquistic Treatment   Treatment focused on Aphasia    Skilled Treatment Patient demonstrated improved receptive language with topic level understading in ~50% of  conversation. Trained patient in communication repairs to increase independence and reduce burden of communication partner to achieve communicatory success. Patient IND with "repeat it" "say it again" strategy however repetition is ~50% successful compared to writing. Patient did not understand the command to "tell me to write it" therefore trained giving a notecard with the words "write it" to the communication partner. Max assist in strategy use faded to minimal. Written strategy successful in 85% of x7 questions. Patient demonstrated improved expression of immediate family members names 8/8 100% with only written question cues (required as receptive understanding of question unsuccessful).              SLP Education - 10/04/21 1259     Education Details strategies    Person(s) Educated Patient;Spouse    Methods Explanation;Demonstration    Comprehension Verbalized understanding;Need further instruction              SLP Short Term Goals - 09/26/21 1649       SLP SHORT TERM GOAL #1   Title The pt will answer simple biographical and orientation yes/no questions presented auditorily at 80% accuracy given moderate visual cues.    Baseline goal met, revised to reflect pt progress    Time 10  Period --   sessions   Status Revised      SLP SHORT TERM GOAL #2   Title The pt will improve speech approximation of rote information such as MOY, DOW and address to ~ 75% intelligibility with moderate multi-modal cues.    Baseline goal revised to reflect progress    Time 10    Period --   sessions   Status Revised      SLP SHORT TERM GOAL #3   Title The pt will identify the correct word given 2 choices at 80% accuracy given frequent visual cues in order to increase ability to comprehend simple instructions.    Baseline goal revised to reflect functionality    Time 10    Period --   sessions   Status Revised      SLP SHORT TERM GOAL #4   Title The pt will identify body parts at 80%  accuracy given minimal cues.    Baseline goal revised to reflect progress    Time 10    Period --   sessions   Status Revised      SLP SHORT TERM GOAL #5   Title The pt will identify the correct picture in a field of 4 when present with the word auditorily at 80% accuracy given minimal cues.    Baseline new goal    Time 10    Period --   sessions   Status Revised              SLP Long Term Goals - 09/26/21 1701       SLP LONG TERM GOAL #1   Title Pt will use multi-modal communication to communitcate basic wants and needs.    Status On-going    Target Date 11/02/21              Plan - 10/04/21 1259     Clinical Impression Statement Pt presents with improving aphasia as evidenced by his improving receptive language abilities as well as the frequency and duration of his islands of intelligible speech. Pt has demonstrated great understanding and use of aphasia communication app on his phone and reading continues to be a strength for pt. Trained communicaiton repairs this date for increased independence in communication success with good carryover of strategies into conversation. Skilled ST intervention continues to be required to target pt's moderate aphasia and increase pt's functional independence thereby reducing caregiver burden.    Speech Therapy Frequency 2x / week    Duration 12 weeks    Treatment/Interventions Language facilitation;Cueing hierarchy;Multimodal communcation approach;Compensatory strategies;SLP instruction and feedback;Functional tasks;Patient/family education    Potential to Achieve Goals Good    Potential Considerations Severity of impairments    SLP Home Exercise Plan provided, see pt instructions section    Consulted and Agree with Plan of Care Patient;Family member/caregiver    Family Member Consulted pt's sister             Patient will benefit from skilled therapeutic intervention in order to improve the following deficits and impairments:    Aphasia  Acute ischemic left MCA stroke [facility])    Problem List Patient Active Problem List   Diagnosis Date Noted   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 ([place]) 05/12/2021   Stroke ([place]) 05/07/2021   Acute ischemic left MCA stroke ([place]) 05/07/2021   Middle cerebral artery embolism, left 05/07/2021   [place] L. Young Mulvey, M.A. [name] Adult-based Speech Language Pathologist [place] [phone]  Dalbert Batman 10/04/2021, 1:01 PM  [place] MAIN [facility] SERVICES [address] [place], [place], [address] Phone: [phone]   Fax:  [phone]   Name: MARCKUS HANOVER MRN: [id number] Date of Birth: 1966-01-13

## 2021-10-06 ENCOUNTER — Ambulatory Visit: Payer: BC Managed Care – PPO

## 2021-10-06 ENCOUNTER — Ambulatory Visit: Payer: BC Managed Care – PPO | Admitting: Speech Pathology

## 2021-10-06 ENCOUNTER — Other Ambulatory Visit: Payer: Self-pay

## 2021-10-06 DIAGNOSIS — I63312 Cerebral infarction due to thrombosis of left middle cerebral artery: Secondary | ICD-10-CM

## 2021-10-06 DIAGNOSIS — R4701 Aphasia: Secondary | ICD-10-CM | POA: Diagnosis not present

## 2021-10-06 DIAGNOSIS — I639 Cerebral infarction, unspecified: Secondary | ICD-10-CM

## 2021-10-07 ENCOUNTER — Telehealth: Payer: Self-pay | Admitting: *Deleted

## 2021-10-07 DIAGNOSIS — I639 Cerebral infarction, unspecified: Secondary | ICD-10-CM

## 2021-10-07 DIAGNOSIS — R4701 Aphasia: Secondary | ICD-10-CM

## 2021-10-07 DIAGNOSIS — I6602 Occlusion and stenosis of left middle cerebral artery: Secondary | ICD-10-CM

## 2021-10-07 NOTE — Therapy (Signed)
[place] MAIN [facility] SERVICES [address] [place], [place], [address] Phone: [phone]   Fax:  [phone]  Speech Language Pathology Treatment  Patient Details  Name: Peter Lucas MRN: [id number] Date of Birth: 25-Apr-1966 Referring Provider (SLP): Alysia Penna   Encounter Date: 10/06/2021   End of Session - 10/07/21 1235     Visit Number 16    Number of Visits 25    Date for SLP Re-Evaluation 11/02/21    Authorization Type [employer]    Authorization Time Period 08/10/2021 thru 11/02/2021    Authorization - Visit Number 6    Authorization - Number of Visits 34    Progress Note Due on Visit 10    SLP Start Time 1400    SLP Stop Time  1500    SLP Time Calculation (min) 60 min    Activity Tolerance Patient tolerated treatment well             Past Medical History:  Diagnosis Date   DM (diabetes mellitus) ([place])    Hyperlipidemia    Hypertension     Past Surgical History:  Procedure Laterality Date   BACK SURGERY     cyst removal   IR CT HEAD LTD  05/07/2021   IR PERCUTANEOUS ART THROMBECTOMY/INFUSION INTRACRANIAL INC DIAG ANGIO  05/07/2021   RADIOLOGY WITH ANESTHESIA N/A 05/07/2021   Procedure: IR WITH ANESTHESIA;  Surgeon: Radiologist, Medication, MD;  Location: [place];  Service: Radiology;  Laterality: N/A;    There were no vitals filed for this visit.   Subjective Assessment - 10/07/21 1230     Subjective "how are you, how was your vacation?"    Patient is accompained by: Family member    Currently in Pain? No/denies                   ADULT SLP TREATMENT - 10/07/21 0001       Treatment Provided   Treatment provided Cognitive-Linquistic      Cognitive-Linquistic Treatment   Treatment focused on Aphasia    Skilled Treatment [employer] Therapy app on iPad used to target multiple skill areas: FOLLOWING DIRECTIONS (level 10 - 50% improving to 100% with repetition of audio and printed text; WORD ID  (level 2) 100% when listening to audio label of each picture provided d/t ambiguity of pictures; ANSWERING QUESTIONS (level 3) 70%; CATEGORIZ ITEMS (level 2) 60%              SLP Education - 10/07/21 1235     Education Details secure chat sent to Dr Letta Pate re: driving, shared recommendation to ask neurologist for clearance    Person(s) Educated Patient    Methods Explanation;Demonstration;Verbal cues;Handout    Comprehension Verbalized understanding;Need further instruction              SLP Short Term Goals - 09/26/21 1649       SLP SHORT TERM GOAL #1   Title The pt will answer simple biographical and orientation yes/no questions presented auditorily at 80% accuracy given moderate visual cues.    Baseline goal met, revised to reflect pt progress    Time 10    Period --   sessions   Status Revised      SLP SHORT TERM GOAL #2   Title The pt will improve speech approximation of rote information such as MOY, DOW and address to ~ 75% intelligibility with moderate multi-modal cues.    Baseline goal revised to reflect progress  Time 10    Period --   sessions   Status Revised      SLP SHORT TERM GOAL #3   Title The pt will identify the correct word given 2 choices at 80% accuracy given frequent visual cues in order to increase ability to comprehend simple instructions.    Baseline goal revised to reflect functionality    Time 10    Period --   sessions   Status Revised      SLP SHORT TERM GOAL #4   Title The pt will identify body parts at 80% accuracy given minimal cues.    Baseline goal revised to reflect progress    Time 10    Period --   sessions   Status Revised      SLP SHORT TERM GOAL #5   Title The pt will identify the correct picture in a field of 4 when present with the word auditorily at 80% accuracy given minimal cues.    Baseline new goal    Time 10    Period --   sessions   Status Revised              SLP Long Term Goals - 09/26/21 1701        SLP LONG TERM GOAL #1   Title Pt will use multi-modal communication to communitcate basic wants and needs.    Status On-going    Target Date 11/02/21              Plan - 10/07/21 1236     Clinical Impression Statement Pt presents with improving aphasia as evidenced by his improving receptive language abilities as well as the frequency and duration of his islands of intelligible speech. He continues to require multimodal means to understand information.  Skilled ST intervention continues to be required to target pt's moderate aphasia and increase pt's functional independence thereby reducing caregiver burden.    Speech Therapy Frequency 2x / week    Duration 12 weeks    Treatment/Interventions Language facilitation;Cueing hierarchy;Multimodal communcation approach;Compensatory strategies;SLP instruction and feedback;Functional tasks;Patient/family education    Potential to Achieve Goals Good    Potential Considerations Severity of impairments    SLP Home Exercise Plan provided, see pt instructions section    Consulted and Agree with Plan of Care Patient;Family member/caregiver    Family Member Consulted pt's sister             Patient will benefit from skilled therapeutic intervention in order to improve the following deficits and impairments:   Aphasia  Cerebrovascular accident (CVA) due to thrombosis of left middle cerebral artery ([place])  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 Rutherford Nail M.S., [name], [place] Pathologist Rehabilitation Services Office [phone]  [name] 10/07/2021, 12:40 PM  [place]  MAIN [facility] SERVICES [address] [place], [place], [address] Phone: [phone]   Fax:  [phone]   Name: Peter Lucas MRN: [id number] Date of Birth: [birth year]

## 2021-10-07 NOTE — Telephone Encounter (Addendum)
-----   Message from Erick Colace, MD sent at 10/06/2021  2:54 PM EDT ----- Refer to Dr [facility] L MCA stroke follow up   Referral placed.

## 2021-10-11 ENCOUNTER — Other Ambulatory Visit: Payer: Self-pay

## 2021-10-11 ENCOUNTER — Ambulatory Visit: Payer: BC Managed Care – PPO | Admitting: Speech Pathology

## 2021-10-11 ENCOUNTER — Ambulatory Visit: Payer: BC Managed Care – PPO | Admitting: Physical Therapy

## 2021-10-11 DIAGNOSIS — I63312 Cerebral infarction due to thrombosis of left middle cerebral artery: Secondary | ICD-10-CM

## 2021-10-11 DIAGNOSIS — I639 Cerebral infarction, unspecified: Secondary | ICD-10-CM

## 2021-10-11 DIAGNOSIS — R4701 Aphasia: Secondary | ICD-10-CM

## 2021-10-12 NOTE — Patient Instructions (Signed)
Complete tasks on [employer] Therapy app

## 2021-10-12 NOTE — Therapy (Signed)
[place] MAIN [facility] SERVICES [address] [place], [place], [address] Phone: [phone]   Fax:  [phone]  Speech Language Pathology Treatment  Patient Details  Name: Peter Lucas MRN: [id number] Date of Birth: [birth year] Referring Provider (SLP): Alysia Penna   Encounter Date: 10/11/2021   End of Session - 10/12/21 0928     Visit Number 17    Number of Visits 25    Date for SLP Re-Evaluation 11/02/21    Authorization Type [employer]    Authorization Time Period 08/10/2021 thru 11/02/2021    Authorization - Visit Number 7    Authorization - Number of Visits 17    Progress Note Due on Visit 10    SLP Start Time 0900    SLP Stop Time  1000    SLP Time Calculation (min) 60 min    Activity Tolerance Patient tolerated treatment well             Past Medical History:  Diagnosis Date   DM (diabetes mellitus) ([place])    Hyperlipidemia    Hypertension     Past Surgical History:  Procedure Laterality Date   BACK SURGERY     cyst removal   IR CT HEAD LTD  05/07/2021   IR PERCUTANEOUS ART THROMBECTOMY/INFUSION INTRACRANIAL INC DIAG ANGIO  05/07/2021   RADIOLOGY WITH ANESTHESIA N/A 05/07/2021   Procedure: IR WITH ANESTHESIA;  Surgeon: Radiologist, Medication, MD;  Location: [place];  Service: Radiology;  Laterality: N/A;    There were no vitals filed for this visit.   Subjective Assessment - 10/12/21 0855     Subjective "how was the game?"    Patient is accompained by: Family member    Currently in Pain? No/denies                   ADULT SLP TREATMENT - 10/12/21 0001       Treatment Provided   Treatment provided Cognitive-Linquistic      Cognitive-Linquistic Treatment   Treatment focused on Aphasia;Patient/family/caregiver education    Skilled Treatment LANGUAGE: 100% accuracy with unscrambling sentences that are broken into 2 parts, 80% with sentences broken into 3 parts, improving to 100% with minimal  SLP assistance; Sentence Completion choosing word from field of 3 written choices 75% improving to 100% with moderate faded to minimal assistance; receptive language and expressive language when counting pennies much improved at 80% accuracy for numbers 1-15; specifically, odd numbers are more challenging - at baseline, pt counts by 2s;              SLP Education - 10/12/21 0928     Education Details upcoming appt with neurologist    Person(s) Educated Patient;Other (comment)   pt's sister   Methods Explanation;Demonstration;Verbal cues;Handout    Comprehension Verbalized understanding;Need further instruction              SLP Short Term Goals - 09/26/21 1649       SLP SHORT TERM GOAL #1   Title The pt will answer simple biographical and orientation yes/no questions presented auditorily at 80% accuracy given moderate visual cues.    Baseline goal met, revised to reflect pt progress    Time 10    Period --   sessions   Status Revised      SLP SHORT TERM GOAL #2   Title The pt will improve speech approximation of rote information such as MOY, DOW and address to ~ 75% intelligibility with moderate  multi-modal cues.    Baseline goal revised to reflect progress    Time 10    Period --   sessions   Status Revised      SLP SHORT TERM GOAL #3   Title The pt will identify the correct word given 2 choices at 80% accuracy given frequent visual cues in order to increase ability to comprehend simple instructions.    Baseline goal revised to reflect functionality    Time 10    Period --   sessions   Status Revised      SLP SHORT TERM GOAL #4   Title The pt will identify body parts at 80% accuracy given minimal cues.    Baseline goal revised to reflect progress    Time 10    Period --   sessions   Status Revised      SLP SHORT TERM GOAL #5   Title The pt will identify the correct picture in a field of 4 when present with the word auditorily at 80% accuracy given minimal cues.     Baseline new goal    Time 10    Period --   sessions   Status Revised              SLP Long Term Goals - 09/26/21 1701       SLP LONG TERM GOAL #1   Title Pt will use multi-modal communication to communitcate basic wants and needs.    Status On-going    Target Date 11/02/21              Plan - 10/12/21 9407     Clinical Impression Statement Pt presents with improving aphasia as evidenced by his improving receptive language abilities as well as the frequency and duration of his islands of intelligible speech. He continues to require multimodal means to understand information.  Skilled ST intervention continues to be required to target pt's moderate aphasia and increase pt's functional independence thereby reducing caregiver burden.    Speech Therapy Frequency 2x / week    Duration 12 weeks    Treatment/Interventions Language facilitation;Cueing hierarchy;Multimodal communcation approach;Compensatory strategies;SLP instruction and feedback;Functional tasks;Patient/family education    Potential to Achieve Goals Good    Potential Considerations Severity of impairments    SLP Home Exercise Plan provided, see pt instructions section    Consulted and Agree with Plan of Care Patient;Family member/caregiver    Family Member Consulted pt's sister             Patient will benefit from skilled therapeutic intervention in order to improve the following deficits and impairments:   Aphasia  Cerebrovascular accident (CVA) due to thrombosis of left middle cerebral artery ([place])  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, [name], [place]  Pathologist Rehabilitation Services Office [phone]  [name] 10/12/2021, 9:30 AM  [place] [address] [place], [place], [address] Phone: [phone]   Fax:  [phone]   Name: Peter Lucas MRN: [id number] Date of Birth: 1966/09/18

## 2021-10-14 ENCOUNTER — Ambulatory Visit: Payer: BC Managed Care – PPO | Admitting: Speech Pathology

## 2021-10-14 ENCOUNTER — Other Ambulatory Visit: Payer: Self-pay

## 2021-10-14 ENCOUNTER — Ambulatory Visit: Payer: BC Managed Care – PPO

## 2021-10-14 DIAGNOSIS — I639 Cerebral infarction, unspecified: Secondary | ICD-10-CM

## 2021-10-14 DIAGNOSIS — R4701 Aphasia: Secondary | ICD-10-CM | POA: Diagnosis not present

## 2021-10-14 DIAGNOSIS — I63312 Cerebral infarction due to thrombosis of left middle cerebral artery: Secondary | ICD-10-CM

## 2021-10-14 NOTE — Patient Instructions (Signed)
Complete worksheets targeting biographical information

## 2021-10-14 NOTE — Therapy (Signed)
[place] MAIN [facility] SERVICES [address] [place], [place], [address] Phone: [phone]   Fax:  [phone]  Speech Language Pathology Treatment  Patient Details  Name: Peter Lucas MRN: [id number] Date of Birth: [birth year] Referring Provider (SLP): Alysia Penna   Encounter Date: 10/14/2021   End of Session - 10/14/21 1218     Visit Number 18    Number of Visits 25    Date for SLP Re-Evaluation 11/02/21    Authorization Type [employer]    Authorization Time Period 08/10/2021 thru 11/02/2021    Authorization - Visit Number 8    Progress Note Due on Visit 10    SLP Start Time 1000    SLP Stop Time  1100    SLP Time Calculation (min) 60 min    Activity Tolerance Patient tolerated treatment well             Past Medical History:  Diagnosis Date   DM (diabetes mellitus) ([place])    Hyperlipidemia    Hypertension     Past Surgical History:  Procedure Laterality Date   BACK SURGERY     cyst removal   IR CT HEAD LTD  05/07/2021   IR PERCUTANEOUS ART THROMBECTOMY/INFUSION INTRACRANIAL INC DIAG ANGIO  05/07/2021   RADIOLOGY WITH ANESTHESIA N/A 05/07/2021   Procedure: IR WITH ANESTHESIA;  Surgeon: Radiologist, Medication, MD;  Location: [place];  Service: Radiology;  Laterality: N/A;    There were no vitals filed for this visit.   Subjective Assessment - 10/14/21 1212     Subjective pt pleasant, motivated, acocmpanied by his sister    Patient is accompained by: Family member    Currently in Pain? No/denies                   ADULT SLP TREATMENT - 10/14/21 0001       Treatment Provided   Treatment provided Cognitive-Linquistic      Cognitive-Linquistic Treatment   Treatment focused on Aphasia;Patient/family/caregiver education    Skilled Treatment Skilled treatment session focused on pt's receptive language, reading ability and expressive language. SLP facilitated session by providing total support for  audiotry questions targeting biographical information. Written information provided with opportunity for pt to copy information to aid in language comprehension              SLP Education - 10/14/21 1216     Education Details impairments related to aphasia    Person(s) Educated Patient;Other (comment)   pt's sister   Methods Explanation;Demonstration;Verbal cues;Handout    Comprehension Verbalized understanding;Need further instruction              SLP Short Term Goals - 09/26/21 1649       SLP SHORT TERM GOAL #1   Title The pt will answer simple biographical and orientation yes/no questions presented auditorily at 80% accuracy given moderate visual cues.    Baseline goal met, revised to reflect pt progress    Time 10    Period --   sessions   Status Revised      SLP SHORT TERM GOAL #2   Title The pt will improve speech approximation of rote information such as MOY, DOW and address to ~ 75% intelligibility with moderate multi-modal cues.    Baseline goal revised to reflect progress    Time 10    Period --   sessions   Status Revised      SLP SHORT TERM GOAL #3  Title The pt will identify the correct word given 2 choices at 80% accuracy given frequent visual cues in order to increase ability to comprehend simple instructions.    Baseline goal revised to reflect functionality    Time 10    Period --   sessions   Status Revised      SLP SHORT TERM GOAL #4   Title The pt will identify body parts at 80% accuracy given minimal cues.    Baseline goal revised to reflect progress    Time 10    Period --   sessions   Status Revised      SLP SHORT TERM GOAL #5   Title The pt will identify the correct picture in a field of 4 when present with the word auditorily at 80% accuracy given minimal cues.    Baseline new goal    Time 10    Period --   sessions   Status Revised              SLP Long Term Goals - 09/26/21 1701       SLP LONG TERM GOAL #1   Title Pt  will use multi-modal communication to communitcate basic wants and needs.    Status On-going    Target Date 11/02/21              Plan - 10/14/21 1219     Clinical Impression Statement Pt presents with improving aphasia as evidenced by his improving receptive language abilities as well as the frequency and duration of his islands of intelligible speech. He continues to require multimodal means to understand information.  Skilled ST intervention continues to be required to target pt's moderate aphasia and increase pt's functional independence thereby reducing caregiver burden.    Speech Therapy Frequency 2x / week    Duration 12 weeks    Treatment/Interventions Language facilitation;Cueing hierarchy;Multimodal communcation approach;Compensatory strategies;SLP instruction and feedback;Functional tasks;Patient/family education    Potential to Achieve Goals Good    Potential Considerations Severity of impairments    SLP Home Exercise Plan provided, see pt instructions section    Consulted and Agree with Plan of Care Patient;Family member/caregiver    Family Member Consulted pt's sister             Patient will benefit from skilled therapeutic intervention in order to improve the following deficits and impairments:   Aphasia  Cerebrovascular accident (CVA) due to thrombosis of left middle cerebral artery ([place])  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 Rutherford Nail M.S., [name], [place] Pathologist Rehabilitation Services Office [phone]  [name] 10/14/2021, 12:20 PM  [place] MAIN [facility] SERVICES [address]  [place], [place], [address] Phone: [phone]   Fax:  [phone]   Name: Peter Lucas MRN: [id number] Date of Birth: 11-24-1966

## 2021-10-18 ENCOUNTER — Ambulatory Visit: Payer: BC Managed Care – PPO | Admitting: Speech Pathology

## 2021-10-18 ENCOUNTER — Ambulatory Visit: Payer: BC Managed Care – PPO

## 2021-10-18 ENCOUNTER — Other Ambulatory Visit: Payer: Self-pay

## 2021-10-18 DIAGNOSIS — I639 Cerebral infarction, unspecified: Secondary | ICD-10-CM

## 2021-10-18 DIAGNOSIS — I63312 Cerebral infarction due to thrombosis of left middle cerebral artery: Secondary | ICD-10-CM

## 2021-10-18 DIAGNOSIS — R4701 Aphasia: Secondary | ICD-10-CM | POA: Diagnosis not present

## 2021-10-19 ENCOUNTER — Telehealth: Payer: Self-pay | Admitting: Adult Health

## 2021-10-19 NOTE — Telephone Encounter (Signed)
Contacted Happi back, she stated she wants to address the ability of the pt driving. She supports him driving, but another provider did not and referred to touch base with Shanda Bumps. The call will be brief. Please advise

## 2021-10-19 NOTE — Telephone Encounter (Signed)
Returned call to [employer], SLP - she wanted to make me aware of patient wanting to return to driving.  She reports she is usually over cautious in this regards but with Mr. Mirabal, she does not have any concerns with him returning to driving at least short distance. She believes return to driving would also help with socialization and limiting his current isolation and increase functional independence. His sister and daughter help with transportation but they do both work. Happi has no concerns regarding any cognitive impairment.  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 Per Happi, he has since been released by PT without any concerns for residual apraxia.   Plan to further address this with patient and accompanying family member at visit tomorrow, 11/17. Very appreciative of phone call as further input from therapies concerning return to driving is always very helpful.    Ihor Austin, AGNP-BC  [facility] [address] [place], [place] [address]  Phone [phone] Fax [phone] Note: This document was prepared with digital dictation and possible smart phrase technology. Any transcriptional errors that result from this process are unintentional.

## 2021-10-19 NOTE — Telephone Encounter (Signed)
Speech Therapist Happi Dreama Saa is asking for a call from Shanda Bumps, NP before pt's appointment on tomorrow, Happi is a Human resources officer with [employer], please call.

## 2021-10-19 NOTE — Therapy (Signed)
[place] MAIN [facility] SERVICES [address] [place], [place], [address] Phone: [phone]   Fax:  [phone]  Speech Language Pathology Treatment  Patient Details  Name: Peter Lucas MRN: [id number] Date of Birth: Apr 27, 1966 Referring Provider (SLP): Alysia Penna   Encounter Date: 10/18/2021   End of Session - 10/19/21 1326     Visit Number 19    Number of Visits 25    Date for SLP Re-Evaluation 11/02/21    Authorization Type [employer]    Authorization Time Period 08/10/2021 thru 11/02/2021    Authorization - Visit Number 9    Progress Note Due on Visit 10    SLP Start Time 0900    SLP Stop Time  1000    SLP Time Calculation (min) 60 min    Activity Tolerance Patient tolerated treatment well             Past Medical History:  Diagnosis Date   DM (diabetes mellitus) ([place])    Hyperlipidemia    Hypertension     Past Surgical History:  Procedure Laterality Date   BACK SURGERY     cyst removal   IR CT HEAD LTD  05/07/2021   IR PERCUTANEOUS ART THROMBECTOMY/INFUSION INTRACRANIAL INC DIAG ANGIO  05/07/2021   RADIOLOGY WITH ANESTHESIA N/A 05/07/2021   Procedure: IR WITH ANESTHESIA;  Surgeon: Radiologist, Medication, MD;  Location: [place];  Service: Radiology;  Laterality: N/A;    There were no vitals filed for this visit.   Subjective Assessment - 10/19/21 1322     Subjective pt pleasant, motivated, acocmpanied by his sister    Patient is accompained by: Family member    Currently in Pain? No/denies                   ADULT SLP TREATMENT - 10/19/21 0001       Treatment Provided   Treatment provided Cognitive-Linquistic      Cognitive-Linquistic Treatment   Treatment focused on Aphasia;Patient/family/caregiver education    Skilled Treatment Skilled treatment session focused on expanding pt's expressive and receptive language abilities. SLP facilitated session by providing multimodal approach to  increase speech intelligibility, specifically pt benefits from visual articulatory cues; maximal to moderate assistance required to answer auditory only biographical questions              SLP Education - 10/19/21 1325     Education Details provided    [employer]) Educated Patient;Other (comment)   pt's sister   Methods Explanation;Demonstration;Verbal cues    Comprehension Verbalized understanding;Need further instruction              SLP Short Term Goals - 09/26/21 1649       SLP SHORT TERM GOAL #1   Title The pt will answer simple biographical and orientation yes/no questions presented auditorily at 80% accuracy given moderate visual cues.    Baseline goal met, revised to reflect pt progress    Time 10    Period --   sessions   Status Revised      SLP SHORT TERM GOAL #2   Title The pt will improve speech approximation of rote information such as MOY, DOW and address to ~ 75% intelligibility with moderate multi-modal cues.    Baseline goal revised to reflect progress    Time 10    Period --   sessions   Status Revised      SLP SHORT TERM GOAL #3   Title The pt  will identify the correct word given 2 choices at 80% accuracy given frequent visual cues in order to increase ability to comprehend simple instructions.    Baseline goal revised to reflect functionality    Time 10    Period --   sessions   Status Revised      SLP SHORT TERM GOAL #4   Title The pt will identify body parts at 80% accuracy given minimal cues.    Baseline goal revised to reflect progress    Time 10    Period --   sessions   Status Revised      SLP SHORT TERM GOAL #5   Title The pt will identify the correct picture in a field of 4 when present with the word auditorily at 80% accuracy given minimal cues.    Baseline new goal    Time 10    Period --   sessions   Status Revised              SLP Long Term Goals - 09/26/21 1701       SLP LONG TERM GOAL #1   Title Pt will use  multi-modal communication to communitcate basic wants and needs.    Status On-going    Target Date 11/02/21              Plan - 10/19/21 1326     Clinical Impression Statement Pt presents with improving aphasia as evidenced by his improving receptive language abilities as well as the frequency and duration of his islands of intelligible speech. Pt struggles with structured language activities because he is reliant on contextual cues to help with receptive and expressive communication. Skilled ST intervention continues to be required to target pt's moderate aphasia thru contextual functional activities and increase pt's functional independence thereby reducing caregiver burden.    Speech Therapy Frequency 2x / week    Duration 12 weeks    Treatment/Interventions Language facilitation;Cueing hierarchy;Multimodal communcation approach;Compensatory strategies;SLP instruction and feedback;Functional tasks;Patient/family education    Potential to Achieve Goals Good    Potential Considerations Severity of impairments    Consulted and Agree with Plan of Care Patient;Family member/caregiver    Family Member Consulted pt's sister             Patient will benefit from skilled therapeutic intervention in order to improve the following deficits and impairments:   Aphasia  Cerebrovascular accident (CVA) due to thrombosis of left middle cerebral artery ([place])  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 Rutherford Nail M.S., [name], [place] Pathologist Rehabilitation Services Office [phone]  [name] 10/19/2021, 1:34 PM  [place] MAIN  [facility] SERVICES [address] [place], [place], [address] Phone: [phone]   Fax:  [phone]   Name: MACK ALVIDREZ MRN: [id number] Date of Birth: 08/19/1966

## 2021-10-20 ENCOUNTER — Encounter: Payer: Self-pay | Admitting: Adult Health

## 2021-10-20 ENCOUNTER — Ambulatory Visit (INDEPENDENT_AMBULATORY_CARE_PROVIDER_SITE_OTHER): Payer: BC Managed Care – PPO | Admitting: Adult Health

## 2021-10-20 VITALS — BP 161/103 | HR 72 | Ht 72.0 in | Wt 287.0 lb

## 2021-10-20 DIAGNOSIS — E119 Type 2 diabetes mellitus without complications: Secondary | ICD-10-CM

## 2021-10-20 DIAGNOSIS — E785 Hyperlipidemia, unspecified: Secondary | ICD-10-CM

## 2021-10-20 DIAGNOSIS — I639 Cerebral infarction, unspecified: Secondary | ICD-10-CM | POA: Diagnosis not present

## 2021-10-20 DIAGNOSIS — I69320 Aphasia following cerebral infarction: Secondary | ICD-10-CM

## 2021-10-20 DIAGNOSIS — I10 Essential (primary) hypertension: Secondary | ICD-10-CM

## 2021-10-20 NOTE — Progress Notes (Deleted)
[facility] [address] [place]. [place] [address] [phone]       STROKE FOLLOW UP NOTE  [name] Date of Birth:  [birth year] Medical Record Number:  [id number]   Reason for Referral: stroke follow up    SUBJECTIVE:   CHIEF COMPLAINT:  No chief complaint on file.   HPI:   Update 10/20/2021 JM: Returns for 28-month stroke follow-up accompanied by ***.   Doing well since prior visit -denies new stroke/TIA symptoms  SLP at [facility] for expressive > receptive aphasia with noted improvement PT completed 9/27 - feels functionally back to baseline - denies weakness, imbalance or gait difficulty OT completed 9/16 as no residual functional deficits He questions possible return to driving  Completed 3 months DAPT -remains on aspirin as well as atorvastatin without side effects Blood pressures today *** Tobacco cessation ***  72-hour Holter monitor no evidence of A. fib or concerning arrhythmia. Currently wearing another monitor for 4 weeks.  Followed by [facility] clinic cardiology Dr. Juliann Pares     History provided for reference purposes only Initial visit 07/12/2021 JM: [name] is being seen for hospital follow-up accompanied by his son, Peter Lucas.  Reports residual language difficulties - word finding or saying inappropriate word and mild comprehension difficulty- does report improvement daily. No residual physical deficits. Does ADLs and majority of IADLs independently although family continues to provide 24-hour supervision as instructed at discharge. Currently working with OT/SLP at [facility] and plans on transitioning to outpatient therapies once completed. Denies new stroke/TIA symptoms.  Compliant on aspirin, Plavix and atorvastatin -mild bruising but no other side effects.  Blood pressure today 147/99. Not routinely monitored at home.  Routinely monitors glucose levels which have been stable.  Reports complete tobacco cessation with use of nicotine patches.   No further concerns at this time.  Stroke admission 05/07/2021 Peter Lucas is a 55 year old male w/pmh of smoking who presented on 05/07/2021 with dense aphasia. Personally reviewed hospitalization from progress notes, lab work and imaging summary provided.  Evaluated by Dr. 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  EF 55 to 60%.  LDL 86.  A1c 10.1 - no prior DM hx.  Recommended DAPT for 3 months then aspirin alone due to reocclusion during thrombectomy as well as atorvastatin 40 mg daily.  Close monitoring of BP initially on Cleviprex - resumed home meds at discharge.  Other stroke risk factors include tobacco use, obesity and suspected sleep apnea.  No prior stroke history.  Residual aphasia, right hemiparesis and decreased functional mobility.  Discharged to CIR on 05/12/2021 - 05/26/2021.       Diagnostic imaging  05/07/21 CT Head  1. 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 ASPECTS 7. 2. No hemorrhagic transformation or mass effect. Suspicion of a posterior left MCA branch occlusion at the posterior sylvian fissure.   05/07/21 CTA Head and Neck  1. Positive for occlusion of a posterior Left MCA branch, distal M2 versus proximal M3. Associated posterior left MCA territory infarct core of 42 mL and 70 mL estimated penumbra by CTP (mismatch of 28 mL). 2. 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 - moderate to severe stenoses of the Right PCA P1/P2 junction. - moderate to severe stenosis of the  Left Pericallosal Artery (ACA territory).   05/09/21 MRI Brain WO Contrast  1. Large, confluent acute posterior left MCA territory infarct with smaller acute infarcts scattered throughout the  left frontotemporal lobes and left insular region. Associated edema and regional mass effect without midline shift. Areas of petechial hemorrhage along the posteromedial aspect of the confluent hemorrhage. 2. Curvilinear susceptibility artifact within the posterior aspect of the left sylvian fissure, extending into the region of confluent infarct, most likely representing distal left MCA thrombus that was better characterized on prior vascular studies. 3. Paranasal sinus mucosal thickening and bilateral mastoid effusions.   05/08/21 Echo Complete w/Bubble  1. Left ventricular ejection fraction, by estimation, is 55 to 60%. The left ventricle has normal function. The left ventricle has no regional wall motion abnormalities. There is moderate left ventricular hypertrophy.  Left ventricular diastolic parameters are indeterminate.   2. Right ventricular systolic function is normal. The right ventricular size is normal. Tricuspid regurgitation signal is inadequate for assessing PA pressure.   3. The mitral valve is grossly normal. Trivial mitral valve regurgitation.   4. The aortic valve is tricuspid. Aortic valve regurgitation is not visualized.   5. The inferior vena cava is normal in size with greater than 50% respiratory variability, suggesting right atrial pressure of 3 mmHg.   6. No visualization of agitated saline bubbles in right heart with reported injection from IV access both upper extremities, possibly inadequate agitation prior to injection. Consider repeat study if clinically indicated.     ROS:   14 system review of systems performed and negative with exception of those listed in HPI  PMH:  Past Medical History:  Diagnosis Date   DM (diabetes mellitus) (HCC)    Hyperlipidemia    Hypertension     PSH:  Past Surgical History:  Procedure Laterality Date   BACK SURGERY     cyst removal   IR CT HEAD LTD  05/07/2021   IR PERCUTANEOUS ART THROMBECTOMY/INFUSION INTRACRANIAL INC  DIAG ANGIO  05/07/2021   RADIOLOGY WITH ANESTHESIA N/A 05/07/2021   Procedure: IR WITH ANESTHESIA;  Surgeon: Radiologist, Medication, MD;  Location: [facility] OR;  Service: Radiology;  Laterality: N/A;    Social History:  Social History   Socioeconomic History   Marital status: Unknown    Spouse name: Not on file   Number of children: Not on file   Years of education: Not on file   Highest education level: Not on file  Occupational History   Not on file  Tobacco Use   Smoking status: Former    Types: Cigarettes   Smokeless tobacco: Never  Vaping Use   Vaping Use: Never used  Substance and Sexual Activity   Alcohol use: Not Currently   Drug use: Never   Sexual activity: Not on file  Other Topics Concern   Not on file  Social History Narrative   Not on file   Social Determinants of Health   Financial Resource Strain: Not on file  Food Insecurity: Not on file  Transportation Needs: Not on file  Physical Activity: Not on file  Stress: Not on file  Social Connections: Not on file  Intimate Partner Violence: Not on file    Family History:  Family History  Problem Relation Age of Onset   Heart failure Mother    Stroke Mother     Medications:   Current Outpatient Medications on File Prior to Visit  Medication Sig Dispense Refill   acetaminophen (TYLENOL) 325 MG tablet Take  2 tablets (650 mg total) by mouth every 4 (four) hours as needed for mild pain (or temp > 37.5 C (99.5 F)).     amLODipine (NORVASC) 10 MG tablet Take 1 tablet (10 mg total) by mouth daily. 30 tablet 0   aspirin EC 81 MG EC tablet Take 1 tablet (81 mg total) by mouth daily. Swallow whole.     atorvastatin (LIPITOR) 40 MG tablet Take 1 tablet (40 mg total) by mouth daily. 30 tablet 0   cloNIDine (CATAPRES) 0.3 MG tablet Take 1 tablet (0.3 mg total) by mouth every 8 (eight) hours. 60 tablet 11   clopidogrel (PLAVIX) 75 MG tablet Take 1 tablet (75 mg total) by mouth daily. 30 tablet 1   glipiZIDE (GLUCOTROL) 5  MG tablet Take 1 tablet (5 mg total) by mouth daily before breakfast. 30 tablet 0   hydrALAZINE (APRESOLINE) 10 MG tablet Take 1 tablet (10 mg total) by mouth every 8 (eight) hours. 90 tablet 0   nicotine (NICODERM CQ - DOSED IN MG/24 HOURS) 21 mg/24hr patch 21 mg patch daily x1 week then 14 mg patch daily x3 weeks then 7 mg patch daily x3 weeks and stop 28 patch 0   polyethylene glycol (MIRALAX / GLYCOLAX) 17 g packet Take 17 g by mouth daily. 14 each 0   No current facility-administered medications on file prior to visit.    Allergies:  No Known Allergies    OBJECTIVE:  Physical Exam  There were no vitals filed for this visit.  There is no height or weight on file to calculate BMI. No results found.  Post stroke PHQ 2/9 Depression screen PHQ 2/9 06/08/2021  Decreased Interest 1  Down, Depressed, Hopeless 0  PHQ - 2 Score 1  Altered sleeping 0  Tired, decreased energy 0  Change in appetite 0  Feeling bad or failure about yourself  0  Trouble concentrating 1  Moving slowly or fidgety/restless 0  Suicidal thoughts 0  PHQ-9 Score 2     General: well developed, well nourished, very pleasant middle-age Caucasian male, seated, in no evident distress Head: head normocephalic and atraumatic.   Neck: supple with no carotid or supraclavicular bruits Cardiovascular: regular rate and rhythm, no murmurs Musculoskeletal: no deformity Skin:  no rash/petichiae Vascular:  Normal pulses all extremities   Neurologic Exam Mental Status: Awake and fully alert. Expressive > receptive aphasia.  Difficulty with repetition.  Able to follow simple step commands with some difficulty.  Difficulty reading.  Difficulty fully assessing cognition due to aphasia.  Mood and affect appropriate.  Cranial Nerves: Fundoscopic exam reveals sharp disc margins. Pupils equal, briskly reactive to light. Extraocular movements full without nystagmus. Visual fields full to confrontation. Hearing intact. Facial  sensation intact. Face, tongue, palate moves normally and symmetrically.  Motor: Normal bulk and tone. Normal strength in all tested extremity muscles Sensory.: intact to touch , pinprick , position and vibratory sensation.  Coordination: Rapid alternating movements normal in all extremities. Finger-to-nose and heel-to-shin performed accurately bilaterally. Gait and Station: Arises from chair without difficulty. Stance is normal. Gait demonstrates normal stride length and balance without use of assistive device. Tandem walk and heel toe mild difficulty.  Reflexes: 1+ and symmetric. Toes downgoing.         ASSESSMENT: Peter Lucas is a 55 y.o. year old male with recent acute/subacute ischemic stroke within the left parietal temporal area on 05/07/2021 in the setting of distal left M2 occlusion s/p IR initially with TICI 2C  reperfusion with reoccluded vessel. Vascular risk factors include HTN, HLD, new dx of DM, intracranial atherosclerosis, tobacco use, obesity and suspected OSA.      PLAN:  Ischemic stroke:  Residual deficit: Expressive> receptive aphasia.  Continue HH therapies and likely transition to outpatient therapy once completed Continue aspirin 81 mg daily and clopidogrel 75 mg daily  and atorvastatin 40 mg daily for secondary stroke prevention.  Advised to stop Plavix on 08/08/2021 as 3 months DAPT completed at that time.   Discussed secondary stroke prevention measures and importance of close PCP follow up for aggressive stroke risk factor management. I have gone over the pathophysiology of stroke, warning signs and symptoms, risk factors and their management in some detail with instructions to go to the closest emergency room for symptoms of concern. HTN: BP goal <130/90.  Stable on current regimen per PCP HLD: LDL goal <70. Recent LDL 86 -continue atorvastatin 40 mg daily.  DMII: A1c goal<7.0. Recent A1c 10.1.  Routinely monitored at home - stable per report.  Tobacco use:  Congratulated on complete tobacco cessation and highly encouraged continued abstinence    Follow up in 3 months or call earlier if needed   CC:  PCP: Peter Rang, PA-C    I spent 56 minutes of face-to-face and non-face-to-face time with patient and son.  This included previsit chart review, lab review, study review, electronic health record documentation, patient and son education and discussion regarding recen prior t stroke and likely etiology, secondary stroke prevention measures and aggressive stroke risk factor management, residual deficits and typical recovery time, and answered all questions to patient and sons satisfaction   Ihor Austin, AGNP-BC  [facility] [address] [place], [place] [address]  Phone [phone] Fax [phone] Note: This document was prepared with digital dictation and possible smart phrase technology. Any transcriptional errors that result from this process are unintentional.

## 2021-10-20 NOTE — Patient Instructions (Signed)
Graduated return to driving as recommended.  It is recommended that you first drive with another licensed driver in an empty parking lot. If you do well with this, you can drive on a quiet street with the licensed driver.  If you do well with this, you can drive on a busy street with a licensed driver.  If you continue to do well, you can be cleared to drive independently.  For the first month after resuming driving, it is recommend no nighttime, busy/heavy traffic roads or Interstate driving.   Complete cardiac monitor as advised by cardiology   Continue aspirin 81 mg daily  and atorvastatin  for secondary stroke prevention  Continue to follow up with PCP regarding cholesterol, diabetes and blood pressure management  Maintain strict control of hypertension with blood pressure goal below 130/90, diabetes with hemoglobin A1c goal below 7.0% and cholesterol with LDL cholesterol (bad cholesterol) goal below 70 mg/dL.       Followup in the future with me in 4 months or call earlier if needed       Thank you for coming to see [place] at [facility]. I hope we have been able to provide you high quality care today.  You may receive a patient satisfaction survey over the next few weeks. We would appreciate your feedback and comments so that we may continue to improve ourselves and the health of our patients.

## 2021-10-20 NOTE — Progress Notes (Signed)
[facility] [address] [place]. [place] [address] [phone]       STROKE FOLLOW UP NOTE  [name] Date of Birth:  [birth year] Medical Record Number:  [id number]   Reason for Referral: stroke follow up    SUBJECTIVE:   CHIEF COMPLAINT:  Chief Complaint  Patient presents with   Follow-up    Rm  3 with daughter Peter Lucas  Pt is well and stable, no new concern s     HPI:  Update 10/20/2022: Patient returns for stroke follow up accompanied by daughter Peter Lucas    Has been doing well since prior visit  Continues working with SLP for residual expressive and receptive language deficit.  He has been making great improvement since prior visit. He does still have some difficulty understanding what is being said to him if too long of a sentence or if spoke to too quickly but able to understand/read written language without difficulty.  Moderate aphasia persists although improved since prior visit.  No cognitive concerns.  He is currently living alone but does have very supportive family checking on him frequently.  Able to maintain ADLs and majority of IADLs including doing home medications - daughter does assist with bill paying.  Daughter does report occasional irritability.  Denies residual functional motor deficits.  Completed PT/OT back in September.  He does question return to driving ability. Spoke to his current speech therapist regarding driving (see telephone note from 10/19/2021). Per daughter, he has been driving some but always accompanied by a family member - daughter has no concerns regarding his driving.  Continuing his Aspirin and Atorvastatin without side effects.  Blood pressure today elevated with similar reading on recheck, asymptomatic- not routinely monitored at home but typically stable provider appointments.  Remains on Holter monitor until 12/1 for total of 30 days.  Completed 72-hour cardiac monitor which was negative for A. fib.    No  further concerns at this time    History provided for reference purposes only. Update 07/12/2021, Peter Lucas is being seen for hospital follow-up accompanied by his son, Peter Lucas.  Reports residual language difficulties - word finding or saying inappropriate word and mild comprehension difficulty- does report improvement daily. No residual physical deficits. Does ADLs and majority of IADLs independently although family continues to provide 24-hour supervision as instructed at discharge. Currently working with OT/SLP at [facility] and plans on transitioning to outpatient therapies once completed. Denies new stroke/TIA symptoms.  Compliant on aspirin, Plavix and atorvastatin -mild bruising but no other side effects.  Blood pressure today 147/99. Not routinely monitored at home.  Routinely monitors glucose levels which have been stable.  Reports complete tobacco cessation with use of nicotine patches.  No further concerns at this time.  Stroke admission 05/07/2021 Peter Lucas is a 55 year old male w/pmh of smoking who presented on 05/07/2021 with dense aphasia. Personally reviewed hospitalization from progress notes, lab work and imaging summary provided.  Evaluated by Dr. 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  EF 55 to 60%.  LDL 86.  A1c 10.1 - no prior DM hx.  Recommended DAPT for 3 months then aspirin alone due to reocclusion during thrombectomy as well as atorvastatin 40 mg daily.  Close monitoring of BP initially on Cleviprex - resumed home meds at discharge.  Other stroke risk factors  include tobacco use, obesity and suspected sleep apnea.  No prior stroke history.  Residual aphasia, right hemiparesis and decreased functional mobility.  Discharged to CIR on 05/12/2021 - 05/26/2021.       Diagnostic imaging  05/07/21 CT Head  1. Posterior  left MCA infarct with fairly well-developed cytotoxic edema. ASPECTS 7. 2. No hemorrhagic transformation or mass effect. Suspicion of a posterior left MCA branch occlusion at the posterior sylvian fissure.   05/07/21 CTA Head and Neck  1. Positive for occlusion of a posterior Left MCA branch, distal M2 versus proximal M3. Associated posterior left MCA territory infarct core of 42 mL and 70 mL estimated penumbra by CTP (mismatch of 28 mL). 2. 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 - moderate to severe stenoses of the Right PCA P1/P2 junction. 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   05/09/21 MRI Brain WO Contrast  1. Large, confluent acute posterior left MCA territory infarct with smaller acute infarcts scattered throughout the left frontotemporal lobes and left insular region. Associated edema and regional mass effect without midline shift. Areas of petechial hemorrhage along the posteromedial aspect of the confluent hemorrhage. 2. Curvilinear susceptibility artifact within the posterior aspect of the left sylvian fissure, extending into the region of confluent infarct, most likely representing distal left MCA thrombus that was better characterized on prior vascular studies. 3. Paranasal sinus mucosal thickening and bilateral mastoid effusions.   05/08/21 Echo Complete w/Bubble  1. Left ventricular ejection fraction, by estimation, is 55 to 60%. The left ventricle has normal function. The left ventricle has no regional wall motion abnormalities. There is moderate left ventricular hypertrophy.  Left ventricular diastolic parameters are indeterminate.   2. Right ventricular systolic function is normal. The right ventricular size is normal. Tricuspid regurgitation signal is inadequate for assessing PA pressure.   3. The mitral valve is  grossly normal. Trivial mitral valve regurgitation.   4. The aortic valve is tricuspid. Aortic valve regurgitation is not visualized.   5. The inferior vena cava is normal in size with greater than 50% respiratory variability, suggesting right atrial pressure of 3 mmHg.   6. No visualization of agitated saline bubbles in right heart with reported injection from IV access both upper extremities, possibly inadequate agitation prior to injection. Consider repeat study if clinically indicated.     ROS:   14 system review of systems performed and negative with exception of those listed in HPI  PMH:  Past Medical History:  Diagnosis Date   DM (diabetes mellitus) (HCC)    Hyperlipidemia    Hypertension     PSH:  Past Surgical History:  Procedure Laterality Date   BACK SURGERY     cyst removal   IR CT HEAD LTD  05/07/2021   IR PERCUTANEOUS ART THROMBECTOMY/INFUSION INTRACRANIAL INC DIAG ANGIO  05/07/2021   RADIOLOGY WITH ANESTHESIA N/A 05/07/2021   Procedure: IR WITH ANESTHESIA;  Surgeon: Radiologist, Medication, MD;  Location: [facility] OR;  Service: Radiology;  Laterality: N/A;    Social History:  Social History   Socioeconomic History   Marital status: Unknown    Spouse name: Not on file   Number of children: Not on file   Years of education: Not on file   Highest education level: Not on file  Occupational History   Not on file  Tobacco Use   Smoking status: Former    Types: Cigarettes  Smokeless tobacco: Never  Vaping Use   Vaping Use: Never used  Substance and Sexual Activity   Alcohol use: Not Currently   Drug use: Never   Sexual activity: Not on file  Other Topics Concern   Not on file  Social History Narrative   Not on file   Social Determinants of Health   Financial Resource Strain: Not on file  Food Insecurity: Not on file  Transportation Needs: Not on file  Physical Activity: Not on file  Stress: Not on file  Social Connections: Not on file  Intimate Partner  Violence: Not on file    Family History:  Family History  Problem Relation Age of Onset   Heart failure Mother    Stroke Mother     Medications:   Current Outpatient Medications on File Prior to Visit  Medication Sig Dispense Refill   acetaminophen (TYLENOL) 325 MG tablet Take 2 tablets (650 mg total) by mouth every 4 (four) hours as needed for mild pain (or temp > 37.5 C (99.5 F)).     amLODipine (NORVASC) 10 MG tablet Take 1 tablet (10 mg total) by mouth daily. 30 tablet 0   aspirin EC 81 MG EC tablet Take 1 tablet (81 mg total) by mouth daily. Swallow whole.     atorvastatin (LIPITOR) 40 MG tablet Take 1 tablet (40 mg total) by mouth daily. 30 tablet 0   cloNIDine (CATAPRES) 0.3 MG tablet Take 1 tablet (0.3 mg total) by mouth every 8 (eight) hours. 60 tablet 11   glipiZIDE (GLUCOTROL) 5 MG tablet Take 1 tablet (5 mg total) by mouth daily before breakfast. 30 tablet 0   polyethylene glycol (MIRALAX / GLYCOLAX) 17 g packet Take 17 g by mouth daily. 14 each 0   No current facility-administered medications on file prior to visit.    Allergies:  No Known Allergies    OBJECTIVE:  Physical Exam  Vitals:   10/20/21 1004  BP: (!) 161/103  Pulse: 72  Weight: 287 lb (130.2 kg)  Height: 6' (1.829 m)    Body mass index is 38.92 kg/m. No results found.  General: well developed, well nourished, very pleasant middle-age Caucasian male, seated, in no evident distress Head: head normocephalic and atraumatic.   Neck: supple with no carotid or supraclavicular bruits Cardiovascular: regular rate and rhythm, no murmurs Musculoskeletal: no deformity Skin:  no rash/petichiae Vascular:  Normal pulses all extremities   Neurologic Exam Mental Status: Awake and fully alert.  Moderate expressive > slight receptive aphasia. Able to follow simple step commands with some difficulty.  Difficulty naming objects.  Oriented to place and time.  Cognition appears intact although difficulty fully  assessing due to aphasia.  Mood and affect appropriate.  Cranial Nerves: Pupils equal, briskly reactive to light. Extraocular movements full without nystagmus. Visual fields full to confrontation. Hearing intact. Facial sensation intact. Face, tongue, palate moves normally and symmetrically.  Motor: Normal bulk and tone. Normal and equal strength in all tested extremity muscles Sensory.: intact to touch , pinprick , position and vibratory sensation.  Coordination: Rapid alternating movements normal in all extremities. Finger-to-nose and heel-to-shin performed with repeated efforts bilaterally. Gait and Station: Arises from chair without difficulty. Stance is normal. Gait demonstrates normal stride length and balance without use of assistive device. Tandem walk and heel toe without difficulty.  Reflexes: 1+ and symmetric. Toes downgoing.        ASSESSMENT: Peter Lucas is a 55 y.o. year old male with recent acute/subacute  ischemic stroke within the left parietal temporal area on 05/07/2021 in the setting of distal left M2 occlusion s/p IR initially with TICI 2C reperfusion with reoccluded vessel. Vascular risk factors include HTN, HLD, new dx of DM, intracranial atherosclerosis, prior tobacco use, and obesity     PLAN:  Ischemic stroke:  Residual deficit: Aphasia although greatly improved since prior visit.  Continue working with SLP for likely ongoing recovery.  Discussed graduated return to driving basis with specific instructions provided in AVS.  Would recommend avoiding main roads, highways and nighttime driving at this time. Patient and daughter verbalized understanding. See telephone note for conversation with SLP re: return to driving 26-RSWN Holter monitor negative.  Currently wearing 30-day cardiac event monitor which will be completed 12/1 Continue aspirin and atorvastatin 40 mg daily for secondary stroke prevention. Discussed secondary stroke prevention measures and importance of  close PCP follow up for aggressive stroke risk factor management. I have gone over the pathophysiology of stroke, warning signs and symptoms, risk factors and their management in some detail with instructions to go to the closest emergency room for symptoms of concern. HTN: BP goal <130/90. Readings elevated today, encouraged to routinely monitor at home and follow up with PCP if remains elevated. HLD: LDL goal <70. Recent LDL 78 down from 86 on atorvastatin 40 mg daily monitored by PCP DMII: A1c goal<7.0. Recent A1c 6.3 down from 10.1 on glipizide monitored/managed by PCP.   Tobacco use: Endorses continued tobacco cessation and highly encouraged continued abstinence    Follow up in 4 months or call earlier if needed   CC:  PCP: Carren Rang, PA-C    I spent 38 minutes of face-to-face and non-face-to-face time with patient and daughter.  This included previsit chart review, lab review, study review, electronic health record documentation, patient and daughter education and discussion regarding prior stroke, secondary stroke prevention measures and aggressive stroke risk factor management, residual deficits and possible further recovery, return to driving with full instructions and answered all questions to patient and daughters satisfaction  Ihor Austin, [facility]  [facility] [address] [place], [place] [address]  Phone [phone] Fax [phone] Note: This document was prepared with digital dictation and possible smart phrase technology. Any transcriptional errors that result from this process are unintentional.

## 2021-10-21 ENCOUNTER — Ambulatory Visit: Payer: BC Managed Care – PPO

## 2021-10-21 ENCOUNTER — Ambulatory Visit: Payer: BC Managed Care – PPO | Admitting: Speech Pathology

## 2021-10-21 ENCOUNTER — Other Ambulatory Visit: Payer: Self-pay

## 2021-10-21 DIAGNOSIS — I63312 Cerebral infarction due to thrombosis of left middle cerebral artery: Secondary | ICD-10-CM

## 2021-10-21 DIAGNOSIS — R4701 Aphasia: Secondary | ICD-10-CM | POA: Diagnosis not present

## 2021-10-21 DIAGNOSIS — I639 Cerebral infarction, unspecified: Secondary | ICD-10-CM

## 2021-10-21 NOTE — Therapy (Signed)
[place] MAIN [facility] SERVICES [address] [place], [place], [address] Phone: [phone]   Fax:  [phone]  Speech Language Pathology Treatment Speech Therapy Progress Note   Dates of reporting period  09/16/2021   to   10/21/2021   Patient Details  Name: Peter Lucas MRN: [id number] Date of Birth: 06/21/1966 Referring Provider (SLP): Alysia Penna   Encounter Date: 10/21/2021  Speech Therapy Progress Note   Dates of Reporting Period: 09/16/2021 to 10/21/2021   Objective: Patient has been seen for 10 speech therapy sessions this reporting period targeting aphasia, reading and writing. Patient is making progress toward LTGs and met 3/4 STGs this reporting period. See skilled intervention, clinical impressions, and goals below for details.   End of Session - 10/21/21 1908     Visit Number 20    Number of Visits 25    Date for SLP Re-Evaluation 11/02/21    Authorization Type [employer]    Authorization Time Period 08/10/2021 thru 11/02/2021    Authorization - Visit Number 10    Progress Note Due on Visit 10    SLP Start Time 1000    SLP Stop Time  1100    SLP Time Calculation (min) 60 min    Activity Tolerance Patient tolerated treatment well             Past Medical History:  Diagnosis Date   DM (diabetes mellitus) ([place])    Hyperlipidemia    Hypertension     Past Surgical History:  Procedure Laterality Date   BACK SURGERY     cyst removal   IR CT HEAD LTD  05/07/2021   IR PERCUTANEOUS ART THROMBECTOMY/INFUSION INTRACRANIAL INC DIAG ANGIO  05/07/2021   RADIOLOGY WITH ANESTHESIA N/A 05/07/2021   Procedure: IR WITH ANESTHESIA;  Surgeon: Radiologist, Medication, MD;  Location: [place];  Service: Radiology;  Laterality: N/A;    There were no vitals filed for this visit.   Subjective Assessment - 10/21/21 1906     Subjective pt pleasant, motivated, acocmpanied by his sister, I can drive    Patient is  accompained by: Family member    Currently in Pain? No/denies                   ADULT SLP TREATMENT - 10/21/21 0001       Treatment Provided   Treatment provided Cognitive-Linquistic      Cognitive-Linquistic Treatment   Treatment focused on Aphasia;Patient/family/caregiver education    Skilled Treatment Skilled treatment session focused on expanding pt's expressive and receptive language abilities. SLP facilitated session by providing functional language topics personal to pt. Pt required repetition of verbal and gestures to increase comprehension of auditory questions. Pt was able to understand and express information without requiring written cues during today's sessions.              SLP Education - 10/21/21 1908     Education Details functional language    Person(s) Educated Patient;Other (comment)   pt's sister   Methods Explanation    Comprehension Verbalized understanding;Returned demonstration              SLP Short Term Goals - 10/21/21 1910       SLP SHORT TERM GOAL #1   Title The pt will answer simple biographical and orientation yes/no questions presented auditorily at 80% accuracy given moderate visual cues.    Status Achieved      SLP SHORT TERM GOAL #2  Title Pt will improve speech approximation of functional language at the phrase level during conversational language use.    Baseline goal revised to reflect progress    Time 10    Period --   sessions   Status Revised      SLP SHORT TERM GOAL #3   Title The pt will identify the correct word given 2 choices at 80% accuracy given frequent visual cues in order to increase ability to comprehend simple instructions.    Baseline great progress made, at 60%    Time 10    Period --   sessions   Status On-going      SLP SHORT TERM GOAL #4   Title The pt will identify body parts at 80% accuracy given minimal cues.    Baseline great progress made, at 60%    Time 10    Period --   sessions    Status On-going      SLP SHORT TERM GOAL #5   Title The pt will identify the correct picture in a field of 4 when present with the word auditorily at 80% accuracy given minimal cues.    Baseline great progress made, at 60%    Time 10    Period --   sessions   Status On-going              SLP Long Term Goals - 10/21/21 1913       SLP LONG TERM GOAL #1   Title Pt will use multi-modal communication to communitcate basic wants and needs.    Status On-going              Plan - 10/21/21 1909     Clinical Impression Statement Pt presents with improving aphasia as evidenced by his improving receptive language abilities as well as the frequency and duration of his islands of intelligible speech. Pt struggles with structured language activities because he is reliant on contextual cues to help with receptive and expressive communication. Pt had great response to conversaitonal topics personal to pt with increased comprehension of verbal information as evidenced by not needing information to be written down.  Skilled ST intervention continues to be required to target pt's moderate aphasia thru contextual functional activities and increase pt's functional independence thereby reducing caregiver burden.    Speech Therapy Frequency 2x / week    Duration 12 weeks    Treatment/Interventions Language facilitation;Cueing hierarchy;Multimodal communcation approach;Compensatory strategies;SLP instruction and feedback;Functional tasks;Patient/family education    Potential to Achieve Goals Good    Consulted and Agree with Plan of Care Patient;Family member/caregiver    Family Member Consulted pt's sister             Patient will benefit from skilled therapeutic intervention in order to improve the following deficits and impairments:   Aphasia  Cerebrovascular accident (CVA) due to thrombosis of left middle cerebral artery ([place])  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) 05/12/2021   Stroke ([place]) 05/07/2021   Acute ischemic left MCA stroke ([place]) 05/07/2021   Middle cerebral artery embolism, left 05/07/2021   Dorothie Wah B. Rutherford Nail M.S., [name], [place] [facility] Office [phone]  [name] 10/21/2021, 7:14 PM  [place] MAIN [facility] SERVICES [address] [place], [place], [address] Phone: [phone]   Fax:  [phone]  Name: Peter Lucas MRN: [id number] Date of Birth: 1966/09/15

## 2021-10-24 ENCOUNTER — Ambulatory Visit: Payer: BC Managed Care – PPO

## 2021-10-24 ENCOUNTER — Ambulatory Visit: Payer: BC Managed Care – PPO | Admitting: Speech Pathology

## 2021-10-26 ENCOUNTER — Ambulatory Visit: Payer: BC Managed Care – PPO | Admitting: Speech Pathology

## 2021-10-26 ENCOUNTER — Ambulatory Visit: Payer: BC Managed Care – PPO

## 2021-11-01 ENCOUNTER — Other Ambulatory Visit: Payer: Self-pay

## 2021-11-01 ENCOUNTER — Ambulatory Visit: Payer: BC Managed Care – PPO | Admitting: Speech Pathology

## 2021-11-01 ENCOUNTER — Ambulatory Visit: Payer: BC Managed Care – PPO

## 2021-11-01 DIAGNOSIS — R4701 Aphasia: Secondary | ICD-10-CM | POA: Diagnosis not present

## 2021-11-01 DIAGNOSIS — I63312 Cerebral infarction due to thrombosis of left middle cerebral artery: Secondary | ICD-10-CM

## 2021-11-01 DIAGNOSIS — I639 Cerebral infarction, unspecified: Secondary | ICD-10-CM

## 2021-11-02 NOTE — Therapy (Signed)
[facility] MAIN [facility] SERVICES [address] [place], [place], [address] Phone: [phone]   Fax:  [phone]  Speech Language Pathology Treatment  RE-CERTIFCATION  Patient Details  Name: Peter Lucas MRN: [id number] Date of Birth: [birth year] Referring Provider (SLP): Claudette Laws   Encounter Date: 11/01/2021   End of Session - 11/02/21 1822     Visit Number 21    Number of Visits 45    Date for SLP Re-Evaluation 01/25/22    Authorization Type [employer]    Authorization Time Period 11/01/2021 thru 01/25/2022    Authorization - Visit Number 1    Progress Note Due on Visit 10    SLP Start Time 0905    SLP Stop Time  1000    SLP Time Calculation (min) 55 min    Activity Tolerance Patient tolerated treatment well             Past Medical History:  Diagnosis Date   DM (diabetes mellitus) (HCC)    Hyperlipidemia    Hypertension     Past Surgical History:  Procedure Laterality Date   BACK SURGERY     cyst removal   IR CT HEAD LTD  05/07/2021   IR PERCUTANEOUS ART THROMBECTOMY/INFUSION INTRACRANIAL INC DIAG ANGIO  05/07/2021   RADIOLOGY WITH ANESTHESIA N/A 05/07/2021   Procedure: IR WITH ANESTHESIA;  Surgeon: Radiologist, Medication, MD;  Location: [facility] OR;  Service: Radiology;  Laterality: N/A;    There were no vitals filed for this visit.   Subjective Assessment - 11/02/21 1816     Subjective pt frustrated by recent appt    Patient is accompained by: Family member    Currently in Pain? No/denies                   ADULT SLP TREATMENT - 11/02/21 0001       Treatment Provided   Treatment provided Cognitive-Linquistic      Cognitive-Linquistic Treatment   Treatment focused on Aphasia;Patient/family/caregiver education    Skilled Treatment Skilled treatment session focused on expanding pt's expressive and receptive language abilities. SLP facilitated session by providing functional language topics  personal to pt. Given topic with written word, pt demonstrates increased comprehension of spontaneous conversational questions about topic in 6 out of 10 opportunities improving to 9 outof 10 with repetition and gestures provided.  Word level prompt was written of conversational topic. When responding to topic, pt's speech intelligibility was improved with 65% of utterances functionally communicative. Pt unaware of semantic paraphasias with increased awareness thru occasional verbal correction; educaiton provided on pre-existing conditions such as smoking, sleep apnea etc increase pt's stroke risk. Despite extensive written, verbal information, pt demonstrated limited comprehension of information.              SLP Education - 11/02/21 1822     Education Details stroke risk factors    Person(s) Educated Patient;Child(ren)    Methods Explanation;Verbal cues;Handout;Demonstration    Comprehension Need further instruction              SLP Short Term Goals - 11/02/21 1832       SLP SHORT TERM GOAL #2   Title Pt will improve speech approximation of functional language at the phrase level during conversational language use.    Status On-going      SLP SHORT TERM GOAL #3   Title The pt will identify the correct word given 2 choices at 80% accuracy given frequent visual cues in  order to increase ability to comprehend simple instructions.    Status On-going      SLP SHORT TERM GOAL #4   Title The pt will identify body parts at 80% accuracy given minimal cues.    Status On-going      SLP SHORT TERM GOAL #5   Title The pt will identify the correct picture in a field of 4 when present with the word auditorily at 80% accuracy given minimal cues.    Status On-going              SLP Long Term Goals - 11/02/21 1833       SLP LONG TERM GOAL #1   Title Pt will use multi-modal communication to communitcate basic wants and needs.    Time 12    Period Weeks    Status On-going     Target Date 01/25/22              Plan - 11/02/21 1824     Clinical Impression Statement Pt presents with improving aphasia as evidenced by his improving receptive language abilities as well as the frequency and duration of his islands of intelligible speech. 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 While reading is a relative strength, his ability is limited to single words to simple phrases. Will request re-certification as skilled ST intervention continues to be required to target pt's moderate aphasia thru contextual functional activities and increase pt's functional independence thereby reducing caregiver burden.    Speech Therapy Frequency 2x / week    Duration 12 weeks    Treatment/Interventions Language facilitation;Cueing hierarchy;Multimodal communcation approach;Compensatory strategies;SLP instruction and feedback;Functional tasks;Patient/family education    Potential to Achieve Goals Good    Potential Considerations Severity of impairments    SLP Home Exercise Plan provided, see pt instructions section    Consulted and Agree with Plan of Care Patient;Family member/caregiver    Family Member Consulted pt's daughter             Patient will benefit from skilled therapeutic intervention in order to improve the following deficits and impairments:   Aphasia  Cerebrovascular accident (CVA) due to thrombosis of left middle cerebral artery (HCC)  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 Dreama Saa M.S., [name], [facility] Speech-Language Pathologist Rehabilitation Services Office [phone], [place] 11/02/2021, 6:34 PM  [place] [facility] [facility] MAIN [facility] SERVICES [address] [place], [place], [address] Phone: [phone]   Fax:  [phone]   Name: Peter Lucas MRN: [id number] Date of Birth: [birth year]

## 2021-11-02 NOTE — Patient Instructions (Signed)
Take pictures of his Christmas decorations for conversational topics in next ST session

## 2021-11-03 ENCOUNTER — Ambulatory Visit: Payer: BC Managed Care – PPO | Attending: Physical Medicine & Rehabilitation | Admitting: Speech Pathology

## 2021-11-03 ENCOUNTER — Other Ambulatory Visit: Payer: Self-pay

## 2021-11-03 DIAGNOSIS — I639 Cerebral infarction, unspecified: Secondary | ICD-10-CM | POA: Diagnosis present

## 2021-11-03 DIAGNOSIS — I63512 Cerebral infarction due to unspecified occlusion or stenosis of left middle cerebral artery: Secondary | ICD-10-CM | POA: Diagnosis present

## 2021-11-03 DIAGNOSIS — R4701 Aphasia: Secondary | ICD-10-CM | POA: Insufficient documentation

## 2021-11-03 DIAGNOSIS — I63312 Cerebral infarction due to thrombosis of left middle cerebral artery: Secondary | ICD-10-CM | POA: Diagnosis present

## 2021-11-03 NOTE — Therapy (Signed)
[place] [facility] MAIN [facility] SERVICES [address] [place], [place], [address] Phone: [phone]   Fax:  [phone]  Speech Language Pathology Treatment  Patient Details  Name: Peter Lucas MRN: [id number] Date of Birth: 1966/11/01 Referring Provider (SLP): Claudette Laws   Encounter Date: 11/03/2021   End of Session - 11/03/21 1140     Visit Number 22    Number of Visits 45    Date for SLP Re-Evaluation 01/25/22    Authorization Type [employer]    Authorization Time Period 11/01/2021 thru 01/25/2022    Authorization - Visit Number 2    Authorization - Number of Visits 17    Progress Note Due on Visit 10    SLP Start Time 0900    SLP Stop Time  1000    SLP Time Calculation (min) 60 min    Activity Tolerance Patient tolerated treatment well             Past Medical History:  Diagnosis Date   DM (diabetes mellitus) (HCC)    Hyperlipidemia    Hypertension     Past Surgical History:  Procedure Laterality Date   BACK SURGERY     cyst removal   IR CT HEAD LTD  05/07/2021   IR PERCUTANEOUS ART THROMBECTOMY/INFUSION INTRACRANIAL INC DIAG ANGIO  05/07/2021   RADIOLOGY WITH ANESTHESIA N/A 05/07/2021   Procedure: IR WITH ANESTHESIA;  Surgeon: Radiologist, Medication, MD;  Location: [facility] OR;  Service: Radiology;  Laterality: N/A;    There were no vitals filed for this visit.   Subjective Assessment - 11/03/21 1123     Subjective Patient pleasant and motivated for therapy, accompanied by sister    Patient is accompained by: Family member    Currently in Pain? No/denies                   ADULT SLP TREATMENT - 11/03/21 0001       Treatment Provided   Treatment provided Cognitive-Linquistic      Cognitive-Linquistic Treatment   Treatment focused on Aphasia;Patient/family/caregiver education    Skilled Treatment Skilled treatment session targeted expansion of pt's expressive and receptive language abilities. SLP  facilitated session by providing functional language topics personal to pt. Patient demonstrated auditory comprehension of topic in 80% of x10 topics. Patient answered questions re: topic with 18% acc x11 though improved to 27% accuracy with x1 verbal semantic lead in word/cue and patient improved to provide answer 100% accuracy with written form of questions. Patient demonstrated appropriate and intelligible responses without paraphasias in 73% of responses of x15.  Pt unaware of semantic paraphasias though increased awareness with SLP verbal and questioning for repetition. Throughout casual conversation patient demonstrated improved verbal expression related to self though is challenged when new information is provided not about the patient (ex SLP provides new info re: topic) to which patient returns to verbal expression of self. Trialed structured task for patient to listen to a simple story and identify (with written cues) key details (who, where, problem). Patient benefitted from written cues to assist story and topic provided prior to new story.      Assessment / Recommendations / Plan   Plan Continue with current plan of care      Progression Toward Goals   Progression toward goals Progressing toward goals              SLP Education - 11/03/21 1139     Education Details expansion strategies for continued improvement  in verbal output    Person(s) Educated Patient    Methods Explanation;Demonstration    Comprehension Verbalized understanding;Need further instruction              SLP Short Term Goals - 11/02/21 1832       SLP SHORT TERM GOAL #2   Title Pt will improve speech approximation of functional language at the phrase level during conversational language use.    Status On-going      SLP SHORT TERM GOAL #3   Title The pt will identify the correct word given 2 choices at 80% accuracy given frequent visual cues in order to increase ability to comprehend simple  instructions.    Status On-going      SLP SHORT TERM GOAL #4   Title The pt will identify body parts at 80% accuracy given minimal cues.    Status On-going      SLP SHORT TERM GOAL #5   Title The pt will identify the correct picture in a field of 4 when present with the word auditorily at 80% accuracy given minimal cues.    Status On-going              SLP Long Term Goals - 11/02/21 1833       SLP LONG TERM GOAL #1   Title Pt will use multi-modal communication to communitcate basic wants and needs.    Time 12    Period Weeks    Status On-going    Target Date 01/25/22              Plan - 11/03/21 1140     Clinical Impression Statement Pt presents with improving aphasia as evidenced by his improving receptive language abilities as well as the frequency and duration of his islands of intelligible speech. Targeted increased expression of sentence level utterances re: home/self, patient improved specificity with clarifying questions though does not yet identify when he needs to provide more information or when he skips over information. Patient continues to benefit from written cues to improve comprehension of conversational questions though is more challenged when the topic is about the conversational partner and not himself. Skilled ST intervention continues to be required to target pt's moderate aphasia thru contextual functional activities and increase pt's functional independence thereby reducing caregiver burden.    Speech Therapy Frequency 2x / week    Duration 12 weeks    Treatment/Interventions Language facilitation;Cueing hierarchy;Multimodal communcation approach;Compensatory strategies;SLP instruction and feedback;Functional tasks;Patient/family education    Potential to Achieve Goals Good    Potential Considerations Severity of impairments    SLP Home Exercise Plan provided, see pt instructions section    Consulted and Agree with Plan of Care Patient;Family  member/caregiver    Family Member Consulted pt's daughter             Patient will benefit from skilled therapeutic intervention in order to improve the following deficits and impairments:   Aphasia  Cerebrovascular accident (CVA) due to thrombosis of left middle cerebral artery (HCC)  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 [name]   [place], [name] 11/03/2021, 11:44 AM  [place]  [facility] MAIN [facility] SERVICES [address] [place], [place], [address] Phone: [phone]   Fax:  [phone]   Name: Peter Lucas MRN: [id number] Date of Birth: December 19, 1965

## 2021-11-03 NOTE — Patient Instructions (Signed)
Educated re: nodding when family/conversational partners know what patient means or is trying to talk about--Now that patient has improved verbal expression trained use of expansion questions to reduce patient skipping over key words. EX: "You know they want me to do it (gestures to the face mask for cpap)" Family response: "who's they" provide as y/n if needed "the doctor wants you to?" And expand "do what?"

## 2021-11-04 ENCOUNTER — Ambulatory Visit: Payer: BC Managed Care – PPO

## 2021-11-04 ENCOUNTER — Encounter: Payer: BC Managed Care – PPO | Admitting: Speech Pathology

## 2021-11-08 ENCOUNTER — Ambulatory Visit: Payer: BC Managed Care – PPO

## 2021-11-08 ENCOUNTER — Ambulatory Visit: Payer: BC Managed Care – PPO | Admitting: Speech Pathology

## 2021-11-08 ENCOUNTER — Other Ambulatory Visit: Payer: Self-pay

## 2021-11-08 DIAGNOSIS — I63312 Cerebral infarction due to thrombosis of left middle cerebral artery: Secondary | ICD-10-CM

## 2021-11-08 DIAGNOSIS — R4701 Aphasia: Secondary | ICD-10-CM | POA: Diagnosis not present

## 2021-11-08 DIAGNOSIS — I639 Cerebral infarction, unspecified: Secondary | ICD-10-CM

## 2021-11-08 NOTE — Therapy (Signed)
[place] [facility] MAIN [facility] SERVICES [address] [place], [place], [address] Phone: [phone]   Fax:  [phone]  Speech Language Pathology Treatment  Patient Details  Name: Peter Lucas MRN: [id number] Date of Birth: 1966-06-13 Referring Provider (SLP): Claudette Laws   Encounter Date: 11/08/2021   End of Session - 11/08/21 1458     Visit Number 23    Number of Visits 45    Date for SLP Re-Evaluation 01/25/22    Authorization Type [employer]    Authorization Time Period 11/01/2021 thru 01/25/2022    Authorization - Visit Number 3    Progress Note Due on Visit 10    SLP Start Time 0900    SLP Stop Time  1000    SLP Time Calculation (min) 60 min    Activity Tolerance Patient tolerated treatment well             Past Medical History:  Diagnosis Date   DM (diabetes mellitus) (HCC)    Hyperlipidemia    Hypertension     Past Surgical History:  Procedure Laterality Date   BACK SURGERY     cyst removal   IR CT HEAD LTD  05/07/2021   IR PERCUTANEOUS ART THROMBECTOMY/INFUSION INTRACRANIAL INC DIAG ANGIO  05/07/2021   RADIOLOGY WITH ANESTHESIA N/A 05/07/2021   Procedure: IR WITH ANESTHESIA;  Surgeon: Radiologist, Medication, MD;  [facility]: [facility] OR;  Service: Radiology;  Laterality: N/A;    There were no vitals filed for this visit.   Subjective Assessment - 11/08/21 1455     Subjective Patient pleasant and motivated for therapy, accompanied by sister    Patient is accompained by: Family member    Currently in Pain? No/denies                   ADULT SLP TREATMENT - 11/08/21 0001       Treatment Provided   Treatment provided Cognitive-Linquistic      Cognitive-Linquistic Treatment   Treatment focused on Aphasia;Patient/family/caregiver education    Skilled Treatment Skilled treatment session targeted re-evaluation using the Western Aphasia Battery. See results in clinical impression statement.               SLP Education - 11/08/21 1456     Education Details progress made    Person(s) Educated Patient;Other (comment)   pt's sister   Methods Explanation;Demonstration;Handout;Verbal cues    Comprehension Verbalized understanding;Need further instruction              SLP Short Term Goals - 11/02/21 1832       SLP SHORT TERM GOAL #2   Title Pt will improve speech approximation of functional language at the phrase level during conversational language use.    Status On-going      SLP SHORT TERM GOAL #3   Title The pt will identify the correct word given 2 choices at 80% accuracy given frequent visual cues in order to increase ability to comprehend simple instructions.    Status On-going      SLP SHORT TERM GOAL #4   Title The pt will identify body parts at 80% accuracy given minimal cues.    Status On-going      SLP SHORT TERM GOAL #5   Title The pt will identify the correct picture in a field of 4 when present with the word auditorily at 80% accuracy given minimal cues.    Status On-going  SLP Long Term Goals - 11/02/21 1833       SLP LONG TERM GOAL #1   Title Pt will use multi-modal communication to communitcate basic wants and needs.    Time 12    Period Weeks    Status On-going    Target Date 01/25/22              Plan - 11/08/21 1459     Clinical Impression Statement Pt presents with improving communication abilities as evidenced by the following scores on the Western Aphasia Battery.   Spontaneous Speech Information content 7/10 Fluency 7/10 Comprehension Yes/No questions 0/60 Auditory Word Recognition 36/60 Sequential Commands 0/80 Repetition 12/100 Naming Object Naming 26/60 Word Fluency 0/20 Sentence Completion 0/10 Responsive Speech 2/10 Aphasia Quotient 40/100 Reading and Writing Reading 70/100 Writing To be completed next session Language Quotient to be completed next session  Scores were used to determine the WAB-R  Aphasia Classification: WERNICKE'S APHASIA.  Pt has demonstrated great improvement when compared to pt's score of 10 during assessment on August 10, 2021.       Speech Therapy Frequency 2x / week    Duration 12 weeks    Treatment/Interventions Language facilitation;Cueing hierarchy;Multimodal communcation approach;Compensatory strategies;SLP instruction and feedback;Functional tasks;Patient/family education    Potential to Achieve Goals Good    Potential Considerations Severity of impairments    Consulted and Agree with Plan of Care Patient;Family member/caregiver    Family Member Consulted pt's sister             Patient will benefit from skilled therapeutic intervention in order to improve the following deficits and impairments:   Aphasia  Cerebrovascular accident (CVA) due to thrombosis of left middle cerebral artery (HCC)  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 Dreama Saa M.S., [name], [facility] Central Pathologist Rehabilitation Services Office [phone]  [place], [place] 11/08/2021, 3:00 PM  [place] [facility] MAIN [facility] SERVICES [address] [place], [place], [address] Phone: [phone]   Fax:  [phone]   Name: Peter Lucas MRN: [id number] Date of Birth: 13-Jul-1966

## 2021-11-11 ENCOUNTER — Ambulatory Visit: Payer: BC Managed Care – PPO | Admitting: Speech Pathology

## 2021-11-11 ENCOUNTER — Telehealth: Payer: Self-pay | Admitting: *Deleted

## 2021-11-11 ENCOUNTER — Ambulatory Visit: Payer: BC Managed Care – PPO

## 2021-11-11 ENCOUNTER — Other Ambulatory Visit: Payer: Self-pay

## 2021-11-11 DIAGNOSIS — R4701 Aphasia: Secondary | ICD-10-CM

## 2021-11-11 DIAGNOSIS — I639 Cerebral infarction, unspecified: Secondary | ICD-10-CM

## 2021-11-11 DIAGNOSIS — I63312 Cerebral infarction due to thrombosis of left middle cerebral artery: Secondary | ICD-10-CM

## 2021-11-11 DIAGNOSIS — I6602 Occlusion and stenosis of left middle cerebral artery: Secondary | ICD-10-CM

## 2021-11-11 NOTE — Telephone Encounter (Signed)
Referral placed to either Dr Pearlean Brownie or Dr Everlena Cooper fist available.

## 2021-11-11 NOTE — Therapy (Signed)
[facility] MAIN [facility] SERVICES [address] [place], [place], [address] Phone: [phone]   Fax:  [phone]  Speech Language Pathology Treatment  Patient Details  Name: DELAWRENCE FRIDMAN MRN: [id number] Date of Birth: 09/10/1966 Referring Provider (SLP): Claudette Laws   Encounter Date: 11/11/2021   End of Session - 11/11/21 1214     Visit Number 24    Number of Visits 45    Date for SLP Re-Evaluation 01/25/22    Authorization Type [employer]    Authorization Time Period 11/01/2021 thru 01/25/2022    Authorization - Visit Number 4    Progress Note Due on Visit 10    SLP Start Time 0902    SLP Stop Time  1000    SLP Time Calculation (min) 58 min    Activity Tolerance Patient tolerated treatment well             Past Medical History:  Diagnosis Date   DM (diabetes mellitus) (HCC)    Hyperlipidemia    Hypertension     Past Surgical History:  Procedure Laterality Date   BACK SURGERY     cyst removal   IR CT HEAD LTD  05/07/2021   IR PERCUTANEOUS ART THROMBECTOMY/INFUSION INTRACRANIAL INC DIAG ANGIO  05/07/2021   RADIOLOGY WITH ANESTHESIA N/A 05/07/2021   Procedure: IR WITH ANESTHESIA;  Surgeon: Radiologist, Medication, MD;  Location: [facility] OR;  Service: Radiology;  Laterality: N/A;    There were no vitals filed for this visit.   Subjective Assessment - 11/11/21 1208     Subjective Patient pleasant and motivated for therapy, accompanied by sister    Patient is accompained by: Family member    Currently in Pain? No/denies                   ADULT SLP TREATMENT - 11/11/21 0001       Treatment Provided   Treatment provided Cognitive-Linquistic      Cognitive-Linquistic Treatment   Treatment focused on Aphasia;Patient/family/caregiver education    Skilled Treatment Skilled treatment session focused on pt's receptive language abilities. SLP faciltiated conversational understanding by writing down several words  related to topic as well as verbal repetition. Pt relied on SLP nonverbal's to judge appropriateness of his reponse and then began listing multiple responses in hopes of providing correct response. Pt with severely decreased receptive abilities during today's session. SLP made recording of common phrases for pt to play and read at the same time to increase pt's receptive ability regarding information related to his children.              SLP Education - 11/11/21 1214     Education Details need for increased awareness of errors    Person(s) Educated Patient;Other (comment)   session   Methods Explanation;Demonstration;Verbal cues;Handout    Comprehension Verbalized understanding;Need further instruction              SLP Short Term Goals - 11/02/21 1832       SLP SHORT TERM GOAL #2   Title Pt will improve speech approximation of functional language at the phrase level during conversational language use.    Status On-going      SLP SHORT TERM GOAL #3   Title The pt will identify the correct word given 2 choices at 80% accuracy given frequent visual cues in order to increase ability to comprehend simple instructions.    Status On-going      SLP SHORT TERM GOAL #  4   Title The pt will identify body parts at 80% accuracy given minimal cues.    Status On-going      SLP SHORT TERM GOAL #5   Title The pt will identify the correct picture in a field of 4 when present with the word auditorily at 80% accuracy given minimal cues.    Status On-going              SLP Long Term Goals - 11/02/21 1833       SLP LONG TERM GOAL #1   Title Pt will use multi-modal communication to communitcate basic wants and needs.    Time 12    Period Weeks    Status On-going    Target Date 01/25/22              Plan - 11/11/21 1215     Clinical Impression Statement Given the severity of pt's language impairments, he has difficulty conditioning to tasks. Likewise, he doesn't have any  awareness of errors and is unable to understand comparison of what he said as incorrect. During today's session he was unable to read basic words or comprehend them. Skilled ST intervention is impearative for improved language comprehension/expression to increase pt's functional independence and reduce caregiver burden.    Speech Therapy Frequency 2x / week    Duration 12 weeks    Treatment/Interventions Language facilitation;Cueing hierarchy;Multimodal communcation approach;Compensatory strategies;SLP instruction and feedback;Functional tasks;Patient/family education    Potential to Achieve Goals Good    Potential Considerations Severity of impairments    SLP Home Exercise Plan provided, see pt instructions section    Consulted and Agree with Plan of Care Patient;Family member/caregiver    Family Member Consulted pt's sister             Patient will benefit from skilled therapeutic intervention in order to improve the following deficits and impairments:   Aphasia  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 Dreama Saa M.S., [name], [facility] Pathologist Rehabilitation Services Office [phone], [place] 11/11/2021, 12:19 PM  [facility] MAIN [facility] SERVICES [address] [place], [place], [address] Phone: [phone]   Fax:  [phone]   Name: AUDRIC VENN MRN: [id number] Date of Birth: [birth year]

## 2021-11-11 NOTE — Patient Instructions (Signed)
Listen to recording while reading list of sentences

## 2021-11-15 ENCOUNTER — Ambulatory Visit: Payer: BC Managed Care – PPO | Admitting: Speech Pathology

## 2021-11-15 ENCOUNTER — Ambulatory Visit: Payer: BC Managed Care – PPO

## 2021-11-15 ENCOUNTER — Other Ambulatory Visit: Payer: Self-pay

## 2021-11-15 DIAGNOSIS — I63312 Cerebral infarction due to thrombosis of left middle cerebral artery: Secondary | ICD-10-CM

## 2021-11-15 DIAGNOSIS — R4701 Aphasia: Secondary | ICD-10-CM | POA: Diagnosis not present

## 2021-11-15 DIAGNOSIS — I639 Cerebral infarction, unspecified: Secondary | ICD-10-CM

## 2021-11-16 NOTE — Therapy (Signed)
[facility] MAIN [facility] SERVICES [address] [place], [place], [address] Phone: [phone]   Fax:  [phone]  Speech Language Pathology Treatment  Patient Details  Name: Peter Lucas MRN: [id number] Date of Birth: 07/12/1966 Referring Provider (SLP): Claudette Laws   Encounter Date: 11/15/2021   End of Session - 11/16/21 1335     Visit Number 25    Number of Visits 45    Date for SLP Re-Evaluation 01/25/22    Authorization Type [employer]    Authorization Time Period 11/01/2021 thru 01/25/2022    Authorization - Visit Number 5    Progress Note Due on Visit 10    SLP Start Time 0900    SLP Stop Time  1000    SLP Time Calculation (min) 60 min    Activity Tolerance Patient tolerated treatment well             Past Medical History:  Diagnosis Date   DM (diabetes mellitus) (HCC)    Hyperlipidemia    Hypertension     Past Surgical History:  Procedure Laterality Date   BACK SURGERY     cyst removal   IR CT HEAD LTD  05/07/2021   IR PERCUTANEOUS ART THROMBECTOMY/INFUSION INTRACRANIAL INC DIAG ANGIO  05/07/2021   RADIOLOGY WITH ANESTHESIA N/A 05/07/2021   Procedure: IR WITH ANESTHESIA;  Surgeon: Radiologist, Medication, MD;  [facility]: [facility] OR;  Service: Radiology;  Laterality: N/A;    There were no vitals filed for this visit.   Subjective Assessment - 11/16/21 1325     Subjective "What did I say?"    Patient is accompained by: Family member    Currently in Pain? No/denies                   ADULT SLP TREATMENT - 11/16/21 0001       Treatment Provided   Treatment provided Cognitive-Linquistic      Cognitive-Linquistic Treatment   Treatment focused on Aphasia;Patient/family/caregiver education    Skilled Treatment Skilled treatment session focused on developing pt's awareness of verbal errors. Pt's communication breakdowns frequently occur d/t incorrect pronoun usage and semantic paraphasias when having  conversation. Pt was independent in using correct pronoun "he" when reading previously introduced sentences to describe his children. To further develop pronoun usage, sentence completion tasks were written with pt choosing correct proonoun in 100% of opportunities. However, pt frequently choose the correct pronoun, he intermittently said the incorrect pronoun with no awareness. SLP further facilitated session by providing strategy to stop and bring awareness to what pt said rather than correcting pt with appropriate word. After several times pt began to understand that awareness was being brought to his verbal errors. He then began demonstrating increased conscious attempts to self-monitor.              SLP Education - 11/16/21 1335     Education Details ways to create awareness for pt of his errors    Person(s) Educated Patient;Other (comment)   sister   Methods Explanation;Verbal cues;Handout    Comprehension Verbalized understanding;Need further instruction              SLP Short Term Goals - 11/02/21 1832       SLP SHORT TERM GOAL #2   Title Pt will improve speech approximation of functional language at the phrase level during conversational language use.    Status On-going      SLP SHORT TERM GOAL #3   Title The  pt will identify the correct word given 2 choices at 80% accuracy given frequent visual cues in order to increase ability to comprehend simple instructions.    Status On-going      SLP SHORT TERM GOAL #4   Title The pt will identify body parts at 80% accuracy given minimal cues.    Status On-going      SLP SHORT TERM GOAL #5   Title The pt will identify the correct picture in a field of 4 when present with the word auditorily at 80% accuracy given minimal cues.    Status On-going              SLP Long Term Goals - 11/02/21 1833       SLP LONG TERM GOAL #1   Title Pt will use multi-modal communication to communitcate basic wants and needs.    Time 12     Period Weeks    Status On-going    Target Date 01/25/22              Plan - 11/16/21 1336     Clinical Impression Statement Given the severity of pt's language impairments, he has difficulty conditioning to tasks. Likewise, he doesn't have any awareness of errors and is unable to understand comparison of what he said as incorrect. During today's session, pt began demonstrating some awareness with total assistance from SLP. Skilled ST intervention is impearative for improved language comprehension/expression to increase pt's functional independence and reduce caregiver burden.    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 Dreama Saa M.S., [name], [facility] Speech-Language Pathologist Rehabilitation Services Office [phone]  [place], [place] 11/16/2021, 1:37 PM  [facility] MAIN [facility] SERVICES [address] [place], [place], [address] Phone: [phone]   Fax:   [phone]   Name: Peter Lucas MRN: [id number] Date of Birth: 06/25/1966

## 2021-11-18 ENCOUNTER — Ambulatory Visit: Payer: BC Managed Care – PPO | Admitting: Speech Pathology

## 2021-11-18 ENCOUNTER — Ambulatory Visit: Payer: BC Managed Care – PPO

## 2021-11-18 ENCOUNTER — Other Ambulatory Visit: Payer: Self-pay

## 2021-11-18 DIAGNOSIS — R4701 Aphasia: Secondary | ICD-10-CM

## 2021-11-18 DIAGNOSIS — I639 Cerebral infarction, unspecified: Secondary | ICD-10-CM

## 2021-11-18 DIAGNOSIS — I63312 Cerebral infarction due to thrombosis of left middle cerebral artery: Secondary | ICD-10-CM

## 2021-11-18 NOTE — Patient Instructions (Signed)
Pt's daughter instructed in clarification vs correcting

## 2021-11-18 NOTE — Therapy (Signed)
[facility] MAIN [facility] SERVICES [address] [place], [place], [address] Phone: [phone]   Fax:  [phone]  Speech Language Pathology Treatment  Patient Details  Name: Peter Lucas MRN: [id number] Date of Birth: 1966-01-11 Referring Provider (SLP): Claudette Laws   Encounter Date: 11/18/2021   End of Session - 11/18/21 1259     Visit Number 26    Number of Visits 45    Date for SLP Re-Evaluation 01/25/22    Authorization Type [employer]    Authorization Time Period 11/01/2021 thru 01/25/2022    Authorization - Visit Number 6    Progress Note Due on Visit 10    SLP Start Time 0900    SLP Stop Time  1000    SLP Time Calculation (min) 60 min    Activity Tolerance Patient tolerated treatment well             Past Medical History:  Diagnosis Date   DM (diabetes mellitus) (HCC)    Hyperlipidemia    Hypertension     Past Surgical History:  Procedure Laterality Date   BACK SURGERY     cyst removal   IR CT HEAD LTD  05/07/2021   IR PERCUTANEOUS ART THROMBECTOMY/INFUSION INTRACRANIAL INC DIAG ANGIO  05/07/2021   RADIOLOGY WITH ANESTHESIA N/A 05/07/2021   Procedure: IR WITH ANESTHESIA;  Surgeon: Radiologist, Medication, MD;  [facility]: [facility] OR;  Service: Radiology;  Laterality: N/A;    There were no vitals filed for this visit.   Subjective Assessment - 11/18/21 1255     Subjective pt pleasant, accompanied by daughter    Patient is accompained by: Family member    Currently in Pain? No/denies                   ADULT SLP TREATMENT - 11/18/21 0001       Treatment Provided   Treatment provided Cognitive-Linquistic      Cognitive-Linquistic Treatment   Treatment focused on Aphasia;Patient/family/caregiver education    Skilled Treatment Skilled treatment session focused on developing pt's awareness of verbal errors. Pt's daughter accompanied him today, opportunity taken to demonstrated ways to clarify  message vs correcting message to increase pt's awareness of verbal errors. Moderate assistance required d/t habitual nature of correcting pt's utterances to increase listener understanding during initial stage of recovery. Pt was supervision level with structured pronoun activities and responded well to clarification x 1 during simple conversation when using incorrect pronoun.              SLP Education - 11/18/21 1259     Education Details creating awareness of verbal errors    Person(s) Educated Patient;Child(ren)    Methods Explanation;Verbal cues;Demonstration    Comprehension Verbalized understanding;Need further instruction              SLP Short Term Goals - 11/02/21 1832       SLP SHORT TERM GOAL #2   Title Pt will improve speech approximation of functional language at the phrase level during conversational language use.    Status On-going      SLP SHORT TERM GOAL #3   Title The pt will identify the correct word given 2 choices at 80% accuracy given frequent visual cues in order to increase ability to comprehend simple instructions.    Status On-going      SLP SHORT TERM GOAL #4   Title The pt will identify body parts at 80% accuracy given minimal cues.  Status On-going      SLP SHORT TERM GOAL #5   Title The pt will identify the correct picture in a field of 4 when present with the word auditorily at 80% accuracy given minimal cues.    Status On-going              SLP Long Term Goals - 11/02/21 1833       SLP LONG TERM GOAL #1   Title Pt will use multi-modal communication to communitcate basic wants and needs.    Time 12    Period Weeks    Status On-going    Target Date 01/25/22              Plan - 11/18/21 1300     Clinical Impression Statement Pt with improved carryover of correct pronoun usage and increased understanding of listener clarification. In response he paused to correct himself instead of continually talking. Pt's daughter would  beneift from further education and opportunities to ways to promote pt's self-awareness of errors. Skilled ST intervention is impearative for improved language comprehension/expression to increase pt's functional independence and reduce caregiver burden.    Speech Therapy Frequency 2x / week    Duration 12 weeks    Treatment/Interventions Language facilitation;Cueing hierarchy;Multimodal communcation approach;Compensatory strategies;SLP instruction and feedback;Functional tasks;Patient/family education    Potential to Achieve Goals Good    Potential Considerations Severity of impairments    SLP Home Exercise Plan provided, see pt instructions section    Consulted and Agree with Plan of Care Patient;Family member/caregiver    Family Member Consulted pt's daughter             Patient will benefit from skilled therapeutic intervention in order to improve the following deficits and impairments:   Aphasia  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 Dreama Saa M.S., [name], [facility] Pathologist Rehabilitation Services Office [phone]  [place], [place] 11/18/2021, 1:03 PM  [facility] MAIN [facility] SERVICES [address] [place], [place], [address] Phone: [phone]   Fax:  [phone]   Name: Peter Lucas MRN: [id number] Date of Birth: [birth year]

## 2021-11-22 ENCOUNTER — Ambulatory Visit: Payer: BC Managed Care – PPO | Admitting: Speech Pathology

## 2021-11-22 ENCOUNTER — Ambulatory Visit: Payer: BC Managed Care – PPO

## 2021-11-22 ENCOUNTER — Other Ambulatory Visit: Payer: Self-pay

## 2021-11-22 DIAGNOSIS — R4701 Aphasia: Secondary | ICD-10-CM | POA: Diagnosis not present

## 2021-11-22 DIAGNOSIS — I63312 Cerebral infarction due to thrombosis of left middle cerebral artery: Secondary | ICD-10-CM

## 2021-11-22 DIAGNOSIS — I639 Cerebral infarction, unspecified: Secondary | ICD-10-CM

## 2021-11-24 NOTE — Therapy (Signed)
[facility] MAIN [facility] SERVICES [address] [place], [place], [address] Phone: [phone]   Fax:  [phone]  Speech Language Pathology Treatment  Patient Details  Name: Peter Lucas MRN: [id number] Date of Birth: 12/08/1965 Referring Provider (SLP): Claudette Laws   Encounter Date: 11/22/2021   End of Session - 11/24/21 0816     Visit Number 27    Number of Visits 45    Date for SLP Re-Evaluation 01/25/22    Authorization Type [employer]    Authorization Time Period 11/01/2021 thru 01/25/2022    Authorization - Visit Number 7    Progress Note Due on Visit 10    SLP Start Time 0900    SLP Stop Time  1000    SLP Time Calculation (min) 60 min    Activity Tolerance Patient tolerated treatment well             Past Medical History:  Diagnosis Date   DM (diabetes mellitus) (HCC)    Hyperlipidemia    Hypertension     Past Surgical History:  Procedure Laterality Date   BACK SURGERY     cyst removal   IR CT HEAD LTD  05/07/2021   IR PERCUTANEOUS ART THROMBECTOMY/INFUSION INTRACRANIAL INC DIAG ANGIO  05/07/2021   RADIOLOGY WITH ANESTHESIA N/A 05/07/2021   Procedure: IR WITH ANESTHESIA;  Surgeon: Radiologist, Medication, MD;  [facility]: [facility] OR;  Service: Radiology;  Laterality: N/A;    There were no vitals filed for this visit.          ADULT SLP TREATMENT - 11/24/21 0001       Treatment Provided   Treatment provided Cognitive-Linquistic      Cognitive-Linquistic Treatment   Treatment focused on Aphasia;Patient/family/caregiver education    Skilled Treatment Skilled treatment session focused on pt's receptive and expressive language abilities. Maximal multimodal assistance with response elaboration to work thru frustration with word finding using written support to aid in word retrieval. Communication breakdown occurred with use of incorrect pronouns during conversational speech, maximal written, verbal and  gestural support required to bring awareness to verbal errors.              SLP Education - 11/24/21 [phone]     Education Details creating awareness of verbal errors    Person(s) Educated Patient;Other (comment)   sister   Methods Explanation;Demonstration;Verbal cues;Handout    Comprehension Verbalized understanding;Need further instruction              SLP Short Term Goals - 11/02/21 1832       SLP SHORT TERM GOAL #2   Title Pt will improve speech approximation of functional language at the phrase level during conversational language use.    Status On-going      SLP SHORT TERM GOAL #3   Title The pt will identify the correct word given 2 choices at 80% accuracy given frequent visual cues in order to increase ability to comprehend simple instructions.    Status On-going      SLP SHORT TERM GOAL #4   Title The pt will identify body parts at 80% accuracy given minimal cues.    Status On-going      SLP SHORT TERM GOAL #5   Title The pt will identify the correct picture in a field of 4 when present with the word auditorily at 80% accuracy given minimal cues.    Status On-going  SLP Long Term Goals - 11/02/21 1833       SLP LONG TERM GOAL #1   Title Pt will use multi-modal communication to communitcate basic wants and needs.    Time 12    Period Weeks    Status On-going    Target Date 01/25/22              Plan - 11/24/21 0816     Clinical Impression Statement Pt with improved carryover of correct pronoun usage and increased understanding of listener clarification. In response he paused to correct himself instead of continually talking. Pt's daughter would beneift from further education and opportunities to ways to promote pt's self-awareness of errors. Skilled ST intervention is impearative for improved language comprehension/expression to increase pt's functional independence and reduce caregiver burden.    Speech Therapy Frequency 2x / week     Duration 12 weeks    Treatment/Interventions Language facilitation;Cueing hierarchy;Multimodal communcation approach;Compensatory strategies;SLP instruction and feedback;Functional tasks;Patient/family education    Potential to Achieve Goals Good    Potential Considerations Severity of impairments    Consulted and Agree with Plan of Care Patient;Family member/caregiver    Family Member Consulted pt's sister             Patient will benefit from skilled therapeutic intervention in order to improve the following deficits and impairments:   Aphasia  Cerebrovascular accident (CVA) due to thrombosis of left middle cerebral artery (HCC)  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 Dreama Saa M.S., [name], [facility] Pathologist Rehabilitation Services Office [phone]  [place], [place] 11/24/2021, 8:17 AM  [facility] MAIN [facility] SERVICES [address] [place], [place], [address] Phone: [phone]   Fax:  [phone]   Name: VUK SKILLERN MRN: [id number] Date of Birth: September 18, 1966

## 2021-11-25 ENCOUNTER — Ambulatory Visit: Payer: BC Managed Care – PPO

## 2021-11-25 ENCOUNTER — Ambulatory Visit: Payer: BC Managed Care – PPO | Admitting: Speech Pathology

## 2021-11-29 ENCOUNTER — Ambulatory Visit: Payer: BC Managed Care – PPO

## 2021-11-29 ENCOUNTER — Ambulatory Visit: Payer: BC Managed Care – PPO | Admitting: Speech Pathology

## 2021-11-29 ENCOUNTER — Encounter: Payer: Self-pay | Admitting: Speech Pathology

## 2021-11-29 ENCOUNTER — Other Ambulatory Visit: Payer: Self-pay

## 2021-11-29 DIAGNOSIS — I63312 Cerebral infarction due to thrombosis of left middle cerebral artery: Secondary | ICD-10-CM

## 2021-11-29 DIAGNOSIS — R4701 Aphasia: Secondary | ICD-10-CM

## 2021-11-29 NOTE — Patient Instructions (Signed)
Patient: Continue asking clarifying questions, asking for repetition, and communicating with partner when you don't understanding what they said. Communication partners: correct verbal errors and clarify vague language (pronouns, name of places/people).

## 2021-11-29 NOTE — Therapy (Signed)
[place] [facility] MAIN [facility] SERVICES [address] [place], [place], [address] Phone: [phone]   Fax:  [phone]  Speech Language Pathology Treatment  Patient Details  Name: Peter Lucas MRN: [id number] Date of Birth: [birth year] Referring Provider (SLP): Claudette Laws   Encounter Date: 11/29/2021   End of Session - 11/29/21 1035     Visit Number 28    Number of Visits 45    Date for SLP Re-Evaluation 01/25/22    Authorization Type [employer]    Authorization Time Period 11/01/2021 thru 01/25/2022    Authorization - Visit Number 8    Authorization - Number of Visits 17    Progress Note Due on Visit 10    SLP Start Time 0900    SLP Stop Time  1000    SLP Time Calculation (min) 60 min    Activity Tolerance Patient tolerated treatment well             Past Medical History:  Diagnosis Date   DM (diabetes mellitus) (HCC)    Hyperlipidemia    Hypertension     Past Surgical History:  Procedure Laterality Date   BACK SURGERY     cyst removal   IR CT HEAD LTD  05/07/2021   IR PERCUTANEOUS ART THROMBECTOMY/INFUSION INTRACRANIAL INC DIAG ANGIO  05/07/2021   RADIOLOGY WITH ANESTHESIA N/A 05/07/2021   Procedure: IR WITH ANESTHESIA;  Surgeon: Radiologist, Medication, MD;  Location: [facility] OR;  Service: Radiology;  Laterality: N/A;    There were no vitals filed for this visit.          ADULT SLP TREATMENT - 11/29/21 0001       Treatment Provided   Treatment provided Cognitive-Linquistic      Cognitive-Linquistic Treatment   Treatment focused on Aphasia;Patient/family/caregiver education    Skilled Treatment Skilled treatment session targeted receptive and expressive language abilities in conversation with moderate-maximun breakdown for response elaboration (reduce skipping words/using vague words for improved communicatory success) and moderate visual and written modalities to check auditory comprehension (as patient tends  to nod along without thorough understanding). Patient demonstrated functional expressive language with phrase level utterances with 70% accuracy across 10 verbalizations. Patient demonstrated auditory comprehension at the sentence level with 59% accuracy per x10 confirmed understanding via open questions, improved with F2 responses x2 and with visual cue (watching lips) x1 and with written modality x1 for total improvement of 14/17 82% accuracy improvement. Patient identified correct written single words F2 with 90% accuracy x10. Patient demonstrated auditory comprehension by identifying objects in F2 with 100% acc x10 and F4 with 90% accuracy x10. Significant improvement in auditory comprehension with structured and concrete words/items.      Assessment / Recommendations / Plan   Plan Continue with current plan of care              SLP Education - 11/29/21 1034     Education Details Increased checking for understanding and error identification/correction    Person(s) Educated Patient    Methods Explanation;Demonstration;Verbal cues    Comprehension Verbalized understanding;Need further instruction              SLP Short Term Goals - 11/02/21 1832       SLP SHORT TERM GOAL #2   Title Pt will improve speech approximation of functional language at the phrase level during conversational language use.    Status On-going      SLP SHORT TERM GOAL #3   Title The pt  will identify the correct word given 2 choices at 80% accuracy given frequent visual cues in order to increase ability to comprehend simple instructions.    Status On-going      SLP SHORT TERM GOAL #4   Title The pt will identify body parts at 80% accuracy given minimal cues.    Status On-going      SLP SHORT TERM GOAL #5   Title The pt will identify the correct picture in a field of 4 when present with the word auditorily at 80% accuracy given minimal cues.    Status On-going              SLP Long Term Goals -  11/02/21 1833       SLP LONG TERM GOAL #1   Title Pt will use multi-modal communication to communitcate basic wants and needs.    Time 12    Period Weeks    Status On-going    Target Date 01/25/22              Plan - 11/29/21 1037     Clinical Impression Statement SLP treatment targeted expressive and receptive language in conversation with moderate-maximum multimodal assist to improve auditory comprehension and increase specificity of verbal output for full communicatory success. Patient improved with structured tasks on the single word level and is ready to advance to phrase level structured auditory comprehension tasks. Educated patient/sister re: increasing accountability/practice in the home environment with clarifying questions instead of moving on in conversation with assumptions of meaning.  Family continues to beneift from further education and opportunities to ways to promote pt's self-awareness of errors. Skilled ST intervention is imperative for improved language comprehension/expression to increase pt's functional independence and reduce caregiver burden. Patient remains an excellent rehab candidate with good prognosis given demonstrated gains thus far.    Speech Therapy Frequency 2x / week    Duration 12 weeks    Treatment/Interventions Language facilitation;Cueing hierarchy;Multimodal communcation approach;Compensatory strategies;SLP instruction and feedback;Functional tasks;Patient/family education    Potential to Achieve Goals Good    Potential Considerations Severity of impairments;Family/community support;Ability to learn/carryover information    SLP Home Exercise Plan provided, see pt instructions section    Consulted and Agree with Plan of Care Patient;Family member/caregiver    Family Member Consulted pt's sister             Patient will benefit from skilled therapeutic intervention in order to improve the following deficits and impairments:    Aphasia  Cerebrovascular accident (CVA) due to thrombosis of left middle cerebral artery [facility])    Problem List Patient Active Problem List   Diagnosis Date Noted   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 Aviel Davalos, M.A. [name] Adult-based Speech Language Pathologist [facility] [phone]  Peggye Ley [place], [place] 11/29/2021, 10:42 AM  [place] [facility] MAIN [facility] SERVICES [address] [place], [place], [address] Phone: [phone]   Fax:  [phone]   Name: JUNO BOZARD MRN: [id number] Date of Birth: Jul 24, 1966

## 2021-12-02 ENCOUNTER — Other Ambulatory Visit: Payer: Self-pay

## 2021-12-02 ENCOUNTER — Ambulatory Visit: Payer: BC Managed Care – PPO

## 2021-12-02 ENCOUNTER — Ambulatory Visit: Payer: BC Managed Care – PPO | Admitting: Speech Pathology

## 2021-12-02 DIAGNOSIS — R4701 Aphasia: Secondary | ICD-10-CM

## 2021-12-02 DIAGNOSIS — I63312 Cerebral infarction due to thrombosis of left middle cerebral artery: Secondary | ICD-10-CM

## 2021-12-02 DIAGNOSIS — I63512 Cerebral infarction due to unspecified occlusion or stenosis of left middle cerebral artery: Secondary | ICD-10-CM

## 2021-12-02 NOTE — Patient Instructions (Signed)
Continue TPT at home (patient demonstrated good progress in app despite expressing concern for trouble completing tasks).  Continue family/communication partners encouraging expansion and specificity in speech.

## 2021-12-02 NOTE — Therapy (Signed)
[facility] MAIN [facility] SERVICES [address] [place], [place], [address] Phone: [phone]   Fax:  [phone]  Speech Language Pathology Treatment  Patient Details  Name: Peter Lucas MRN: [id number] Date of Birth: [birth year] Referring Provider (SLP): Claudette Laws   Encounter Date: 12/02/2021   End of Session - 12/02/21 1047     Visit Number 29    Number of Visits 45    Date for SLP Re-Evaluation 01/25/22    Authorization Type [employer]    Authorization Time Period 11/01/2021 thru 01/25/2022    Authorization - Visit Number 9    Authorization - Number of Visits 17    Progress Note Due on Visit 10    SLP Start Time 0900    SLP Stop Time  1000    SLP Time Calculation (min) 60 min    Activity Tolerance Patient tolerated treatment well             Past Medical History:  Diagnosis Date   DM (diabetes mellitus) (HCC)    Hyperlipidemia    Hypertension     Past Surgical History:  Procedure Laterality Date   BACK SURGERY     cyst removal   IR CT HEAD LTD  05/07/2021   IR PERCUTANEOUS ART THROMBECTOMY/INFUSION INTRACRANIAL INC DIAG ANGIO  05/07/2021   RADIOLOGY WITH ANESTHESIA N/A 05/07/2021   Procedure: IR WITH ANESTHESIA;  Surgeon: Radiologist, Medication, MD;  Location: [facility] OR;  Service: Radiology;  Laterality: N/A;    There were no vitals filed for this visit.   Subjective Assessment - 12/02/21 1028     Subjective Patient presents highly motivated with sister, showed SLP compliance with home exercise program TPT app    Patient is accompained by: Family member    Currently in Pain? No/denies    Pain Score 0-No pain                   ADULT SLP TREATMENT - 12/02/21 0001       General Information   Behavior/Cognition Alert;Cooperative;Pleasant mood      Treatment Provided   Treatment provided Cognitive-Linquistic      Cognitive-Linquistic Treatment   Treatment focused on  Aphasia;Patient/family/caregiver education    Skilled Treatment Skilled treatment session targeted receptive and expressive language abilities in conversation with mod-max breakdown for response elaboration (reduce skipping words/using vague words for improved communicatory success) and moderate visual and written modalities to check auditory comprehension (as patient tends to nod along without thorough understanding). Patient demonstrated functional expressive language with phrase level utterances with 80% accuracy across 20 verbalizations in conversion. Patient demonstrated auditory comprehension at the conversational level with 60% accuracy per x20 appropriate responses to questions in conv. Improved to 100% accuracy with repetition and visual cue (looking at speakers lips) x2 and with visual cue (gesture) x1 and with written modality.  Structured auditory comprehension tasks completed with spoken single verbs and matching picture selection in a F4 with 73% accuracy x15 improved to 100% accuracy with repetition, cue to watch lips and reduction in field to 2 choices. Patient demonsrated auditory comprehsnion at the phrase level via picture selection in a F4 (utilizing same pictures from verb task) with 90% accuracy x20 indicative of carryover and improved auditory recognition with continued practice and context. Completed reading tasks for improved mutlimodalities of language. Patient demonstrated reading at single word level (category) via identifying appropriate item that fits from a F4 with 100% accuracy x5.  Patient demonstrated reading  with simple  sentence completion with F3 response choice with 40% accuracy x10, improved to 70% accuracy given visual and verbal cues.      Assessment / Recommendations / Plan   Plan Continue with current plan of care              SLP Education - 12/02/21 1046     Education Details expansion, ask for details, allow opportunities for increased output despite  challenging moments    Person(s) Educated Patient    Methods Explanation;Demonstration    Comprehension Verbalized understanding;Need further instruction;Returned demonstration              SLP Short Term Goals - 11/02/21 1832       SLP SHORT TERM GOAL #2   Title Pt will improve speech approximation of functional language at the phrase level during conversational language use.    Status On-going      SLP SHORT TERM GOAL #3   Title The pt will identify the correct word given 2 choices at 80% accuracy given frequent visual cues in order to increase ability to comprehend simple instructions.    Status On-going      SLP SHORT TERM GOAL #4   Title The pt will identify body parts at 80% accuracy given minimal cues.    Status On-going      SLP SHORT TERM GOAL #5   Title The pt will identify the correct picture in a field of 4 when present with the word auditorily at 80% accuracy given minimal cues.    Status On-going              SLP Long Term Goals - 11/02/21 1833       SLP LONG TERM GOAL #1   Title Pt will use multi-modal communication to communitcate basic wants and needs.    Time 12    Period Weeks    Status On-going    Target Date 01/25/22              Plan - 12/02/21 1047     Clinical Impression Statement SLP treatment targeted expressive and receptive language in conversation with moderate-maximum multimodal assist to improve auditory comprehension and increase specificity of verbal output for full communicatory success. Patient improved with phrase level structured auditory comprehension tasks see data above. Family continues to benefit from further education and opportunities to ways to promote pt's self-awareness of errors and increased meaningful verbal output. Skilled ST intervention is imperative for improved language comprehension/expression to increase pt's functional independence and reduce caregiver burden. Patient remains an excellent rehab candidate  with good prognosis given demonstrated gains thus far.    Speech Therapy Frequency 2x / week    Duration 12 weeks    Treatment/Interventions Language facilitation;Cueing hierarchy;Multimodal communcation approach;Compensatory strategies;SLP instruction and feedback;Functional tasks;Patient/family education    Potential to Achieve Goals Good    Potential Considerations Severity of impairments;Family/community support;Ability to learn/carryover information    SLP Home Exercise Plan provided, see pt instructions section    Consulted and Agree with Plan of Care Patient;Family member/caregiver    Family Member Consulted pt's sister             Patient will benefit from skilled therapeutic intervention in order to improve the following deficits and impairments:   Aphasia  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 Schae Cando, M.A. [name] Adult-based Speech Language Pathologist [facility] [phone]  Peggye Ley [place], [place] 12/02/2021, 10:51 AM  [facility] MAIN [facility] SERVICES [address] [place], [place], [address] Phone: [phone]   Fax:  [phone]   Name: Peter Lucas MRN: [id number] Date of Birth: 10-Feb-1966

## 2021-12-06 ENCOUNTER — Ambulatory Visit: Payer: BC Managed Care – PPO

## 2021-12-06 ENCOUNTER — Other Ambulatory Visit: Payer: Self-pay

## 2021-12-06 ENCOUNTER — Ambulatory Visit: Payer: BC Managed Care – PPO | Attending: Physical Medicine & Rehabilitation | Admitting: Speech Pathology

## 2021-12-06 DIAGNOSIS — R4701 Aphasia: Secondary | ICD-10-CM | POA: Diagnosis present

## 2021-12-06 DIAGNOSIS — I63312 Cerebral infarction due to thrombosis of left middle cerebral artery: Secondary | ICD-10-CM | POA: Diagnosis present

## 2021-12-06 DIAGNOSIS — I639 Cerebral infarction, unspecified: Secondary | ICD-10-CM | POA: Diagnosis present

## 2021-12-08 ENCOUNTER — Ambulatory Visit: Payer: BC Managed Care – PPO | Admitting: Speech Pathology

## 2021-12-08 ENCOUNTER — Ambulatory Visit: Payer: BC Managed Care – PPO | Admitting: Physical Therapy

## 2021-12-08 NOTE — Therapy (Signed)
[facility] MAIN [facility] SERVICES [address] [place], [place], [address] Phone: [phone]   Fax:  [phone]  Speech Language Pathology Treatment  Speech Therapy Progress Note   Dates of reporting period   10/21/2022  to  12/06/2021  Patient Details  Name: Peter Lucas MRN: [id number] Date of Birth: July 12, 1966 Referring Provider (SLP): Claudette Laws   Encounter Date: 12/06/2021  Speech Therapy Progress Note   Dates of Reporting Period: 10/21/2022 to 12/06/2021   Objective: Patient has been seen for 10 speech therapy sessions this reporting period targeting aphasia, reading and writing. Patient continues to make slow progress towards his STGs and LTGs this reporting period. See skilled intervention, clinical impressions, and goals below for details.   End of Session - 12/08/21 0816     Visit Number 30    Number of Visits 45    Date for SLP Re-Evaluation 01/25/22    Authorization Type [employer]    Authorization Time Period 11/01/2021 thru 01/25/2022    Authorization - Visit Number 10    Progress Note Due on Visit 10    SLP Start Time 1400    SLP Stop Time  1500    SLP Time Calculation (min) 60 min    Activity Tolerance Patient tolerated treatment well             Past Medical History:  Diagnosis Date   DM (diabetes mellitus) (HCC)    Hyperlipidemia    Hypertension     Past Surgical History:  Procedure Laterality Date   BACK SURGERY     cyst removal   IR CT HEAD LTD  05/07/2021   IR PERCUTANEOUS ART THROMBECTOMY/INFUSION INTRACRANIAL INC DIAG ANGIO  05/07/2021   RADIOLOGY WITH ANESTHESIA N/A 05/07/2021   Procedure: IR WITH ANESTHESIA;  Surgeon: Radiologist, Medication, MD;  [facility]: [facility] OR;  Service: Radiology;  Laterality: N/A;    There were no vitals filed for this visit.   Subjective Assessment - 12/08/21 0815     Subjective "It is good having you back"    Patient is accompained by: Family member     Currently in Pain? No/denies                   ADULT SLP TREATMENT - 12/08/21 0001       Treatment Provided   Treatment provided Cognitive-Linquistic      Cognitive-Linquistic Treatment   Treatment focused on Aphasia;Patient/family/caregiver education    Skilled Treatment Skilled treatment session targeted receptive and expressive language abilities in conversation with mod-max breakdown for response elaboration (reduce skipping words/using vague words for improved communicatory success) and moderate visual and written modalities to check auditory comprehension (as patient tends to nod along without thorough understanding). Patient demonstrated functional expressive language with phrase level utterances with 80% accuracy across 20 verbalizations in conversion. Patient demonstrated auditory comprehension at the conversational level with 60% accuracy per x20 appropriate responses to questions in conv. Improved to 100% accuracy with repetition and visual cue (looking at speakers lips) x2 and with visual cue (gesture) x1 and with written modality.  Structured auditory comprehension tasks completed with spoken single verbs and matching picture selection in a F4 with 73% accuracy x15 improved to 100% accuracy with repetition, cue to watch lips and reduction in field to 2 choices. Patient demonsrated auditory comprehsnion at the phrase level via picture selection in a F4 (utilizing same pictures from verb task) with 90% accuracy x20 indicative of carryover and improved auditory  recognition with continued practice and context. Completed reading tasks for improved mutlimodalities of language. Patient demonstrated reading at single word level (category) via identifying appropriate item that fits from a F4 with 100% accuracy x5.  Patient demonstrated reading with simple  sentence completion with F3 response choice with 40% accuracy x10, improved to 70% accuracy given visual and verbal cues.               SLP Education - 12/08/21 [phone]     Education Details allow opportunities for increased output despite challenging moments    Person(s) Educated Patient    Methods Explanation;Demonstration;Verbal cues    Comprehension Verbalized understanding;Need further instruction              SLP Short Term Goals - 12/08/21 0818       SLP SHORT TERM GOAL #1   Title The pt will answer simple biographical and orientation yes/no questions presented auditorily at 80% accuracy given moderate visual cues.    Time 10    Period --   sessions   Status On-going      SLP SHORT TERM GOAL #3   Title The pt will identify the correct word given 2 choices at 80% accuracy given frequent visual cues in order to increase ability to comprehend simple instructions.    Time 10    Period --   sessions   Status On-going      SLP SHORT TERM GOAL #4   Title The pt will identify body parts at 80% accuracy given minimal cues.    Time 10    Status On-going      SLP SHORT TERM GOAL #5   Title The pt will identify the correct picture in a field of 4 when present with the word auditorily at 80% accuracy given minimal cues.    Time 10    Status On-going              SLP Long Term Goals - 12/08/21 5053       SLP LONG TERM GOAL #1   Title Pt will use multi-modal communication to communitcate basic wants and needs.    Time 12    Period Weeks    Status On-going    Target Date 01/25/22              Plan - 12/08/21 0817     Clinical Impression Statement SLP treatment targeted expressive and receptive language in conversation with moderate-maximum multimodal assist to improve auditory comprehension and increase specificity of verbal output for full communicatory success. Patient improved with phrase level structured auditory comprehension tasks see data above. Family continues to benefit from further education and opportunities to ways to promote pt's self-awareness of errors and increased meaningful  verbal output. Skilled ST intervention is imperative for improved language comprehension/expression to increase pt's functional independence and reduce caregiver burden. Patient remains an excellent rehab candidate with good prognosis given demonstrated gains thus far.    Speech Therapy Frequency 2x / week    Duration 12 weeks    Treatment/Interventions Language facilitation;Cueing hierarchy;Multimodal communcation approach;Compensatory strategies;SLP instruction and feedback;Functional tasks;Patient/family education    Potential to Achieve Goals Good    Potential Considerations Severity of impairments;Family/community support;Ability to learn/carryover information    Consulted and Agree with Plan of Care Patient;Family member/caregiver    Family Member Consulted pt's sister             Patient will benefit from skilled therapeutic intervention in order to improve the  following deficits and impairments:   Aphasia  Cerebrovascular accident (CVA) due to thrombosis of left middle cerebral artery (HCC)  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 [facility](HCC)    Problem List Patient Active Problem List   Diagnosis Date Noted   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 Dreama Saaverton, M.S., [name], [facility] Speech-Language Pathologist Rehabilitation Services Office [phone]  [place], [place] 12/08/2021, 8:20 AM  [facility] [address] [place], [place], [address] Phone: [phone]   Fax:  [phone]   Name: Colin Bentonony R Donado MRN: [id number] Date of Birth: February 09, 1966

## 2021-12-09 ENCOUNTER — Other Ambulatory Visit: Payer: Self-pay

## 2021-12-09 ENCOUNTER — Ambulatory Visit: Payer: BC Managed Care – PPO | Admitting: Speech Pathology

## 2021-12-09 DIAGNOSIS — R4701 Aphasia: Secondary | ICD-10-CM | POA: Diagnosis not present

## 2021-12-09 DIAGNOSIS — I639 Cerebral infarction, unspecified: Secondary | ICD-10-CM

## 2021-12-09 DIAGNOSIS — I63312 Cerebral infarction due to thrombosis of left middle cerebral artery: Secondary | ICD-10-CM

## 2021-12-09 NOTE — Therapy (Signed)
[facility] MAIN [facility] SERVICES [address] [place], [place], [address] Phone: [phone]   Fax:  [phone]  Speech Language Pathology Treatment  Patient Details  Name: Peter Lucas MRN: [id number] Date of Birth: 08/18/1966 Referring Provider (SLP): Claudette Laws   Encounter Date: 12/09/2021   End of Session - 12/09/21 1545     Visit Number 31    Number of Visits 45    Date for SLP Re-Evaluation 01/25/22    Authorization Type [employer]    Authorization Time Period 11/01/2021 thru 01/25/2022    Authorization - Visit Number 1    Progress Note Due on Visit 10    SLP Start Time 0900    SLP Stop Time  1000    SLP Time Calculation (min) 60 min    Activity Tolerance Patient tolerated treatment well             Past Medical History:  Diagnosis Date   DM (diabetes mellitus) (HCC)    Hyperlipidemia    Hypertension     Past Surgical History:  Procedure Laterality Date   BACK SURGERY     cyst removal   IR CT HEAD LTD  05/07/2021   IR PERCUTANEOUS ART THROMBECTOMY/INFUSION INTRACRANIAL INC DIAG ANGIO  05/07/2021   RADIOLOGY WITH ANESTHESIA N/A 05/07/2021   Procedure: IR WITH ANESTHESIA;  Surgeon: Radiologist, Medication, MD;  [facility] OR;  Service: Radiology;  Laterality: N/A;    There were no vitals filed for this visit.   Subjective Assessment - 12/09/21 1538     Subjective "how did your son do?" referring to previous conversation    Patient is accompained by: Family member    Currently in Pain? No/denies                   ADULT SLP TREATMENT - 12/09/21 0001       Treatment Provided   Treatment provided Cognitive-Linquistic      Cognitive-Linquistic Treatment   Treatment focused on Aphasia;Patient/family/caregiver education    Skilled Treatment Skilled treatment sesison focused on pt's expressive and language deficits. SLP facilitated session by providing functional language exercises to promote  awareness of errors and vocabulary for response elaboration. [facility]: Aphasia REhab, Unit 5 - Functional language pp 164-165: 75% accuracy when presented with auditory repetition of sentence x 2 and auditory of 3 words that were choices; p 170 increased ability with auditory only of sentence to achieve 60%.              SLP Education - 12/09/21 1544     Education Details progress    Person(s) Educated Patient;Other (comment)    Methods Explanation;Demonstration;Verbal cues    Comprehension Verbalized understanding              SLP Short Term Goals - 12/08/21 0818       SLP SHORT TERM GOAL #1   Title The pt will answer simple biographical and orientation yes/no questions presented auditorily at 80% accuracy given moderate visual cues.    Time 10    Period --   sessions   Status On-going      SLP SHORT TERM GOAL #3   Title The pt will identify the correct word given 2 choices at 80% accuracy given frequent visual cues in order to increase ability to comprehend simple instructions.    Time 10    Period --   sessions   Status On-going      SLP  SHORT TERM GOAL #4   Title The pt will identify body parts at 80% accuracy given minimal cues.    Time 10    Status On-going      SLP SHORT TERM GOAL #5   Title The pt will identify the correct picture in a field of 4 when present with the word auditorily at 80% accuracy given minimal cues.    Time 10    Status On-going              SLP Long Term Goals - 12/08/21 6010       SLP LONG TERM GOAL #1   Title Pt will use multi-modal communication to communitcate basic wants and needs.    Time 12    Period Weeks    Status On-going    Target Date 01/25/22              Plan - 12/09/21 1545     Clinical Impression Statement Pt displayed improved abilities during today's session when completing structured language tasks as well as conversation. During spontaneous conversation, pt exhibit improved islands of intelligible  speech when talking about his twin boys. Pt with correct use of their names and details for 75% listener understanding. 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 Skilled ST service is required to target the above mentioned deficits to improve functional communication and reduce caregiver burden.    Speech Therapy Frequency 2x / week    Duration 12 weeks    Treatment/Interventions Language facilitation;Cueing hierarchy;Multimodal communcation approach;Compensatory strategies;SLP instruction and feedback;Functional tasks;Patient/family education    Potential to Achieve Goals Good    Potential Considerations Severity of impairments;Family/community support;Ability to learn/carryover information    SLP Home Exercise Plan provided, see pt instructions section    Consulted and Agree with Plan of Care Patient;Family member/caregiver    Family Member Consulted pt's sister             Patient will benefit from skilled therapeutic intervention in order to improve the following deficits and impairments:   Aphasia  Cerebrovascular accident (CVA) due to thrombosis of left middle cerebral artery (HCC)  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 Dreama Saa M.S., [name], [facility] Speech-Language Pathologist Rehabilitation Services Office [phone]  Reuel [place], [place] 12/09/2021, 3:57 PM  [place] [facility] MAIN [facility] SERVICES [address] [place], [place], [address] Phone: [phone]   Fax:  [phone]   Name: Peter Lucas MRN: [id number] Date of Birth: [birth year]

## 2021-12-13 ENCOUNTER — Ambulatory Visit: Payer: BC Managed Care – PPO | Admitting: Speech Pathology

## 2021-12-13 ENCOUNTER — Ambulatory Visit: Payer: BC Managed Care – PPO | Admitting: Physical Therapy

## 2021-12-13 DIAGNOSIS — R4701 Aphasia: Secondary | ICD-10-CM

## 2021-12-13 DIAGNOSIS — I63312 Cerebral infarction due to thrombosis of left middle cerebral artery: Secondary | ICD-10-CM

## 2021-12-13 DIAGNOSIS — I639 Cerebral infarction, unspecified: Secondary | ICD-10-CM

## 2021-12-14 NOTE — Therapy (Signed)
[place] [facility] MAIN [facility] SERVICES [address] [place], [place], [address] Phone: [phone]   Fax:  [phone]  Speech Language Pathology Treatment  Patient Details  Name: Peter Lucas MRN: [id number] Date of Birth: 09/16/1966 Referring Provider (SLP): Claudette Laws   Encounter Date: 12/13/2021   End of Session - 12/14/21 1604     Visit Number 32    Number of Visits 45    Date for SLP Re-Evaluation 01/25/22    Authorization Type [employer]    Authorization Time Period 11/01/2021 thru 01/25/2022    Authorization - Visit Number 2    Authorization - Number of Visits 60    Progress Note Due on Visit 10    SLP Start Time 1400    SLP Stop Time  1500    SLP Time Calculation (min) 60 min    Activity Tolerance Patient tolerated treatment well             Past Medical History:  Diagnosis Date   DM (diabetes mellitus) (HCC)    Hyperlipidemia    Hypertension     Past Surgical History:  Procedure Laterality Date   BACK SURGERY     cyst removal   IR CT HEAD LTD  05/07/2021   IR PERCUTANEOUS ART THROMBECTOMY/INFUSION INTRACRANIAL INC DIAG ANGIO  05/07/2021   RADIOLOGY WITH ANESTHESIA N/A 05/07/2021   Procedure: IR WITH ANESTHESIA;  Surgeon: Radiologist, Medication, MD;  [facility];  Service: Radiology;  Laterality: N/A;    There were no vitals filed for this visit.   Subjective Assessment - 12/14/21 1603     Subjective "how are you?"    Patient is accompained by: Family member    Currently in Pain? No/denies                   ADULT SLP TREATMENT - 12/14/21 0001       Treatment Provided   Treatment provided Cognitive-Linquistic      Cognitive-Linquistic Treatment   Treatment focused on Aphasia;Patient/family/caregiver education    Skilled Treatment Skilled treatment session focused on pt's receptive language deficits. SLP facilitated session by providing moderate assistance to achieve 80% accuracy when  selecting correct word to compete auditory sentence.  Maximal multimodal assistance including written cues for conversational topics. However pt required multiple repetitions and attempts to help pt comprehend conversational topic. Once he demonstrated understanding, he had no awareness of decreased receptive abilities as he said, "why didn't you say that to start with?"              SLP Education - 12/14/21 1603     Education Details ways to build awareness of expressive language errors    Person(s) Educated Patient;Other (comment)   pt's sister   Methods Explanation;Demonstration;Verbal cues    Comprehension Verbalized understanding              SLP Short Term Goals - 12/08/21 0818       SLP SHORT TERM GOAL #1   Title The pt will answer simple biographical and orientation yes/no questions presented auditorily at 80% accuracy given moderate visual cues.    Time 10    Period --   sessions   Status On-going      SLP SHORT TERM GOAL #3   Title The pt will identify the correct word given 2 choices at 80% accuracy given frequent visual cues in order to increase ability to comprehend simple instructions.    Time 10  Period --   sessions   Status On-going      SLP SHORT TERM GOAL #4   Title The pt will identify body parts at 80% accuracy given minimal cues.    Time 10    Status On-going      SLP SHORT TERM GOAL #5   Title The pt will identify the correct picture in a field of 4 when present with the word auditorily at 80% accuracy given minimal cues.    Time 10    Status On-going              SLP Long Term Goals - 12/08/21 9518       SLP LONG TERM GOAL #1   Title Pt will use multi-modal communication to communitcate basic wants and needs.    Time 12    Period Weeks    Status On-going    Target Date 01/25/22              Plan - 12/14/21 1606     Clinical Impression Statement Pt continues to present with severely impairing aphasia that results in  inability understand and communicate even basic wants, needs.  Deficits result in inability to use compensatory strategies such as gestures, writing or drawing d/t inability to understand instruction for use and inability to write. Skilled ST intervention is recommended to target the above mentioned deficits to increase pt's ability to regain functional independence.    Speech Therapy Frequency 2x / week    Duration 12 weeks    Treatment/Interventions Language facilitation;Cueing hierarchy;Multimodal communcation approach;Compensatory strategies;SLP instruction and feedback;Functional tasks;Patient/family education    Potential to Achieve Goals Good    Potential Considerations Severity of impairments;Family/community support;Ability to learn/carryover information    SLP Home Exercise Plan provided, see pt instructions section    Consulted and Agree with Plan of Care Patient;Family member/caregiver    Family Member Consulted pt's sister             Patient will benefit from skilled therapeutic intervention in order to improve the following deficits and impairments:   Aphasia  Cerebrovascular accident (CVA) due to thrombosis of left middle cerebral artery (HCC)  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 Dreama Saa M.S., [name], [facility] Pathologist Rehabilitation Services Office [phone]  [place], [place] 12/14/2021, 4:07 PM  [place] [facility] MAIN [facility] SERVICES [address] [place], [place], [address] Phone: [phone]   Fax:  [phone]   Name: Peter Lucas MRN: [id number] Date of Birth: 1966-11-21

## 2021-12-15 ENCOUNTER — Ambulatory Visit: Payer: BC Managed Care – PPO | Admitting: Physical Therapy

## 2021-12-15 ENCOUNTER — Encounter: Payer: BC Managed Care – PPO | Admitting: Speech Pathology

## 2021-12-16 ENCOUNTER — Other Ambulatory Visit: Payer: Self-pay

## 2021-12-16 ENCOUNTER — Ambulatory Visit: Payer: BC Managed Care – PPO | Admitting: Speech Pathology

## 2021-12-16 DIAGNOSIS — R4701 Aphasia: Secondary | ICD-10-CM

## 2021-12-16 DIAGNOSIS — I639 Cerebral infarction, unspecified: Secondary | ICD-10-CM

## 2021-12-16 DIAGNOSIS — I63312 Cerebral infarction due to thrombosis of left middle cerebral artery: Secondary | ICD-10-CM

## 2021-12-16 NOTE — Therapy (Signed)
[place] [facility] MAIN [facility] SERVICES [address] [place], [place], [address] Phone: [phone]   Fax:  [phone]  Speech Language Pathology Treatment  Patient Details  Name: Peter Lucas MRN: [id number] Date of Birth: [birth year] Referring Provider (SLP): Claudette Laws   Encounter Date: 12/16/2021   End of Session - 12/16/21 1207     Visit Number 33    Number of Visits 45    Date for SLP Re-Evaluation 01/25/22    Authorization Type [employer]    Authorization Time Period 11/01/2021 thru 01/25/2022    Authorization - Visit Number 3    Progress Note Due on Visit 10    SLP Start Time 1005    SLP Stop Time  1100    SLP Time Calculation (min) 55 min    Activity Tolerance Patient tolerated treatment well             Past Medical History:  Diagnosis Date   DM (diabetes mellitus) (HCC)    Hyperlipidemia    Hypertension     Past Surgical History:  Procedure Laterality Date   BACK SURGERY     cyst removal   IR CT HEAD LTD  05/07/2021   IR PERCUTANEOUS ART THROMBECTOMY/INFUSION INTRACRANIAL INC DIAG ANGIO  05/07/2021   RADIOLOGY WITH ANESTHESIA N/A 05/07/2021   Procedure: IR WITH ANESTHESIA;  Surgeon: Radiologist, Medication, MD;  [facility] OR;  Service: Radiology;  Laterality: N/A;    There were no vitals filed for this visit.   Subjective Assessment - 12/16/21 1206     Subjective "when are you leaving?" referring to SLP leaving for trip    Patient is accompained by: Family member    Currently in Pain? No/denies                   ADULT SLP TREATMENT - 12/16/21 0001       Treatment Provided   Treatment provided Cognitive-Linquistic      Cognitive-Linquistic Treatment   Treatment focused on Aphasia;Patient/family/caregiver education    Skilled Treatment Skilled treatment session targeted receptive and expressive language abilities in conversation with moderate written cues to improve understanding of  verbal questions. Pt's verbal expression continues to improve as evidenced pt intermittent correct use of pronouns as well as increased self-monitoring and carryover after correction. Pt continues to require maximal multi-modal cues during moments of expressive inaccuracies. He benefits from having statement written down and verbalized. Pt is occasionally able to understand his mistakes.              SLP Education - 12/16/21 1207     Education Details written cues to aid in receptive ability, written cues to aid in developing awareness of expressive mistakes    Person(s) Educated Patient;Other (comment)   pt's sister   Methods Explanation;Demonstration;Verbal cues;Handout    Comprehension Verbalized understanding;Need further instruction              SLP Short Term Goals - 12/08/21 0818       SLP SHORT TERM GOAL #1   Title The pt will answer simple biographical and orientation yes/no questions presented auditorily at 80% accuracy given moderate visual cues.    Time 10    Period --   sessions   Status On-going      SLP SHORT TERM GOAL #3   Title The pt will identify the correct word given 2 choices at 80% accuracy given frequent visual cues in order to increase ability to comprehend  simple instructions.    Time 10    Period --   sessions   Status On-going      SLP SHORT TERM GOAL #4   Title The pt will identify body parts at 80% accuracy given minimal cues.    Time 10    Status On-going      SLP SHORT TERM GOAL #5   Title The pt will identify the correct picture in a field of 4 when present with the word auditorily at 80% accuracy given minimal cues.    Time 10    Status On-going              SLP Long Term Goals - 12/08/21 1423       SLP LONG TERM GOAL #1   Title Pt will use multi-modal communication to communitcate basic wants and needs.    Time 12    Period Weeks    Status On-going    Target Date 01/25/22              Plan - 12/16/21 1208      Clinical Impression Statement Pt continues to present with severely impairing aphasia that results in inability understand and communicate even basic wants, needs.  Deficits result in inability to use compensatory strategies such as gestures, writing or drawing d/t inability to understand instruction for use and inability to write. Skilled ST intervention is recommended to target the above mentioned deficits to increase pt's ability to regain functional independence.    Speech Therapy Frequency 2x / week    Duration 12 weeks    Treatment/Interventions Language facilitation;Cueing hierarchy;Multimodal communcation approach;Compensatory strategies;SLP instruction and feedback;Functional tasks;Patient/family education    Potential to Achieve Goals Good    Potential Considerations Severity of impairments;Family/community support;Ability to learn/carryover information    SLP Home Exercise Plan provided, see pt instructions section    Consulted and Agree with Plan of Care Patient;Family member/caregiver    Family Member Consulted pt's sister             Patient will benefit from skilled therapeutic intervention in order to improve the following deficits and impairments:   Aphasia  Cerebrovascular accident (CVA) due to thrombosis of left middle cerebral artery (HCC)  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 Dreama Saa M.S., [name], [facility] Pathologist Rehabilitation Services Office [phone]  [place], [place] 12/16/2021, 12:09 PM   [facility] MAIN [facility] SERVICES [address] [place], [place], [address] Phone: [phone]   Fax:   [phone]   Name: KAMEL HAVEN MRN: [id number] Date of Birth: 1966-01-11

## 2021-12-19 ENCOUNTER — Ambulatory Visit: Payer: BC Managed Care – PPO | Admitting: Speech Pathology

## 2021-12-20 ENCOUNTER — Other Ambulatory Visit: Payer: Self-pay

## 2021-12-20 ENCOUNTER — Ambulatory Visit: Payer: BC Managed Care – PPO | Admitting: Physical Therapy

## 2021-12-20 ENCOUNTER — Ambulatory Visit: Payer: BC Managed Care – PPO | Admitting: Speech Pathology

## 2021-12-20 DIAGNOSIS — I639 Cerebral infarction, unspecified: Secondary | ICD-10-CM

## 2021-12-20 DIAGNOSIS — R4701 Aphasia: Secondary | ICD-10-CM | POA: Diagnosis not present

## 2021-12-20 DIAGNOSIS — I63312 Cerebral infarction due to thrombosis of left middle cerebral artery: Secondary | ICD-10-CM

## 2021-12-21 NOTE — Therapy (Signed)
[place] MAIN [facility] SERVICES [address] [place], [place], [address] Phone: [phone]   Fax:  [phone]  Speech Language Pathology Treatment  Patient Details  Name: Peter Lucas MRN: [id number] Date of Birth: 31-Jul-1966 Referring Provider (SLP): Alysia Penna   Encounter Date: 12/20/2021   End of Session - 12/21/21 1824     Visit Number 34    Number of Visits 23    Date for SLP Re-Evaluation 01/25/22    Authorization Type [employer]    Authorization Time Period 11/01/2021 thru 01/25/2022    Authorization - Visit Number 4    Progress Note Due on Visit 10    SLP Start Time 1300    SLP Stop Time  1400    SLP Time Calculation (min) 60 min    Activity Tolerance Patient tolerated treatment well             Past Medical History:  Diagnosis Date   DM (diabetes mellitus) ([place])    Hyperlipidemia    Hypertension     Past Surgical History:  Procedure Laterality Date   BACK SURGERY     cyst removal   IR CT HEAD LTD  05/07/2021   IR PERCUTANEOUS ART THROMBECTOMY/INFUSION INTRACRANIAL INC DIAG ANGIO  05/07/2021   RADIOLOGY WITH ANESTHESIA N/A 05/07/2021   Procedure: IR WITH ANESTHESIA;  Surgeon: Radiologist, Medication, MD;  Location: [place];  Service: Radiology;  Laterality: N/A;    There were no vitals filed for this visit.   Subjective Assessment - 12/21/21 1612     Subjective "how was it?' referring to therapist's daughter's volleyball tournament    Patient is accompained by: Family member    Currently in Pain? No/denies                   ADULT SLP TREATMENT - 12/21/21 0001       Treatment Provided   Treatment provided Cognitive-Linquistic      Cognitive-Linquistic Treatment   Treatment focused on Aphasia;Patient/family/caregiver education    Skilled Treatment Skilled treatment session targeted receptive and expressive language abilities in conversation with written cues required to improve  understanding of verbal questions. In addition, pt required additional verbal cues to understand written cues during today's session when engaging in less predictible conversational topics. Pt also required picture support recall of his neighbors' names.  Pt continues to require maximal multi-modal cues during moments of expressive inaccuracies. He rewuired from having statement written down and verbalized.              SLP Education - 12/21/21 1823     Education Details written cues to aid in receptive ability, written cues to aid in developing awareness of expressive mistakes    Person(s) Educated Patient;Other (comment)   pt's sister   Methods Explanation;Demonstration;Verbal cues;Handout    Comprehension Need further instruction              SLP Short Term Goals - 12/08/21 0818       SLP SHORT TERM GOAL #1   Title The pt will answer simple biographical and orientation yes/no questions presented auditorily at 80% accuracy given moderate visual cues.    Time 10    Period --   sessions   Status On-going      SLP SHORT TERM GOAL #3   Title The pt will identify the correct word given 2 choices at 80% accuracy given frequent visual cues in order to increase ability to comprehend simple  instructions.    Time 10    Period --   sessions   Status On-going      SLP SHORT TERM GOAL #4   Title The pt will identify body parts at 80% accuracy given minimal cues.    Time 10    Status On-going      SLP SHORT TERM GOAL #5   Title The pt will identify the correct picture in a field of 4 when present with the word auditorily at 80% accuracy given minimal cues.    Time 10    Status On-going              SLP Long Term Goals - 12/08/21 [id number]       SLP LONG TERM GOAL #1   Title Pt will use multi-modal communication to communitcate basic wants and needs.    Time 12    Period Weeks    Status On-going    Target Date 01/25/22              Plan - 12/21/21 1824     Clinical  Impression Statement Pt continues to benefit from contextual based language intervention to increase receptive and expressive communication. In addition, written cues are also targeting pt's reading comprehension which continues to be severely impaired. Skilled St intervention is recommended to target pt's severe receptive language deficits to increase pt's abiity to regain functional independence, return to work and community activities.    Speech Therapy Frequency 2x / week    Duration 12 weeks    Treatment/Interventions Language facilitation;Cueing hierarchy;Multimodal communcation approach;Compensatory strategies;SLP instruction and feedback;Functional tasks;Patient/family education    Potential to Achieve Goals Good    Potential Considerations Severity of impairments;Family/community support;Ability to learn/carryover information    Consulted and Agree with Plan of Care Patient;Family member/caregiver    Family Member Consulted pt's sister             Patient will benefit from skilled therapeutic intervention in order to improve the following deficits and impairments:   Aphasia  Cerebrovascular accident (CVA) due to thrombosis of left middle cerebral artery ([place])  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 Rutherford Nail M.S., [name], [facility] Pathologist Rehabilitation Services Office [phone], [place] 12/21/2021, 6:27 PM  [place] MAIN [facility] SERVICES [address] [place], [place], [address] Phone: [phone]   Fax:  [phone]   Name: Peter Lucas MRN: [id number] Date of Birth: 06-02-1966

## 2021-12-22 ENCOUNTER — Other Ambulatory Visit: Payer: Self-pay

## 2021-12-22 ENCOUNTER — Ambulatory Visit: Payer: BC Managed Care – PPO | Admitting: Speech Pathology

## 2021-12-22 ENCOUNTER — Ambulatory Visit: Payer: BC Managed Care – PPO | Admitting: Physical Therapy

## 2021-12-22 DIAGNOSIS — I639 Cerebral infarction, unspecified: Secondary | ICD-10-CM

## 2021-12-22 DIAGNOSIS — I63312 Cerebral infarction due to thrombosis of left middle cerebral artery: Secondary | ICD-10-CM

## 2021-12-22 DIAGNOSIS — R4701 Aphasia: Secondary | ICD-10-CM | POA: Diagnosis not present

## 2021-12-23 NOTE — Therapy (Signed)
[place] [facility] MAIN [facility] SERVICES [address] [place], [place], [address] Phone: [phone]   Fax:  [phone]  Speech Language Pathology Treatment  Patient Details  Name: Peter Lucas MRN: [id number] Date of Birth: 03/02/1966 Referring Provider (SLP): Claudette Laws   Encounter Date: 12/22/2021   End of Session - 12/23/21 1041     Visit Number 35    Number of Visits 45    Date for SLP Re-Evaluation 01/25/22    Authorization Type [employer]    Authorization Time Period 11/01/2021 thru 01/25/2022    Authorization - Visit Number 5    Authorization - Number of Visits 60    Progress Note Due on Visit 10    SLP Start Time 0900    SLP Stop Time  1000    SLP Time Calculation (min) 60 min    Activity Tolerance Patient tolerated treatment well             Past Medical History:  Diagnosis Date   DM (diabetes mellitus) (HCC)    Hyperlipidemia    Hypertension     Past Surgical History:  Procedure Laterality Date   BACK SURGERY     cyst removal   IR CT HEAD LTD  05/07/2021   IR PERCUTANEOUS ART THROMBECTOMY/INFUSION INTRACRANIAL INC DIAG ANGIO  05/07/2021   RADIOLOGY WITH ANESTHESIA N/A 05/07/2021   Procedure: IR WITH ANESTHESIA;  Surgeon: Radiologist, Medication, MD;  [facility]: [facility] OR;  Service: Radiology;  Laterality: N/A;    There were no vitals filed for this visit.   Subjective Assessment - 12/23/21 1021     Subjective pt pleasant, motivated, drove himself to haircut appt last evening    Patient is accompained by: Family member    Currently in Pain? No/denies                   ADULT SLP TREATMENT - 12/23/21 0001       Treatment Provided   Treatment provided Cognitive-Linquistic      Cognitive-Linquistic Treatment   Treatment focused on Aphasia;Patient/family/caregiver education    Skilled Treatment Skilled treatment session focused on pt's receptive language deficits. SLP facilitated session by having  selectig correct numbers 1-10 and 11-19; DOW and MOY. Pt used clinic's SGD to select the requested icon in an effort to auditorially bombard to increase self-monitoring/self-correction.     NUMBERS: #1-10; 50% accuracy increasing to 100% with repetition; #11-19; 50% improving to 100% with repetition;   DOW: 43% improving to 90% with repetition and heavy SLP cues;   MOY: 43% improving to 80% with repetition and heavy SLP cues;   CONVERSATIONAL CARRYOVER; heavy context cues required for listener understanding d/t word finding difficulty related to pt's hobby              SLP Education - 12/23/21 1041     Education Details receptive language deficits    Person(s) Educated Patient;Other (comment)   pt's sister   Methods Explanation;Demonstration;Verbal cues    Comprehension Need further instruction              SLP Short Term Goals - 12/08/21 0818       SLP SHORT TERM GOAL #1   Title The pt will answer simple biographical and orientation yes/no questions presented auditorily at 80% accuracy given moderate visual cues.    Time 10    Period --   sessions   Status On-going      SLP SHORT TERM GOAL #3  Title The pt will identify the correct word given 2 choices at 80% accuracy given frequent visual cues in order to increase ability to comprehend simple instructions.    Time 10    Period --   sessions   Status On-going      SLP SHORT TERM GOAL #4   Title The pt will identify body parts at 80% accuracy given minimal cues.    Time 10    Status On-going      SLP SHORT TERM GOAL #5   Title The pt will identify the correct picture in a field of 4 when present with the word auditorily at 80% accuracy given minimal cues.    Time 10    Status On-going              SLP Long Term Goals - 12/08/21 3419       SLP LONG TERM GOAL #1   Title Pt will use multi-modal communication to communitcate basic wants and needs.    Time 12    Period Weeks    Status On-going    Target  Date 01/25/22              Plan - 12/23/21 1042     Clinical Impression Statement Pt responds well to therapy task but continues with moderate Wernicke's aphasia. Skilled ST intervention continues to be required to increase pt's receptive language abilities and increase self-monitoring of verbal expression.    Speech Therapy Frequency 2x / week    Duration 12 weeks    Treatment/Interventions Language facilitation;Cueing hierarchy;Multimodal communcation approach;Compensatory strategies;SLP instruction and feedback;Functional tasks;Patient/family education    Potential to Achieve Goals Good    Potential Considerations Severity of impairments;Family/community support;Ability to learn/carryover information    Consulted and Agree with Plan of Care Patient;Family member/caregiver    Family Member Consulted pt's sister             Patient will benefit from skilled therapeutic intervention in order to improve the following deficits and impairments:   Aphasia  Cerebrovascular accident (CVA) due to thrombosis of left middle cerebral artery (HCC)  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 Dreama Saa M.S., [name], [facility] Pathologist Rehabilitation Services Office [phone], [place] 12/23/2021, 10:44 AM  [place] [facility] MAIN [facility] SERVICES [address] [place], [place], [address] Phone: [phone]   Fax:  [phone]   Name: Peter Lucas MRN: [id number] Date of Birth: 1966/03/19

## 2021-12-27 ENCOUNTER — Ambulatory Visit: Payer: BC Managed Care – PPO | Admitting: Speech Pathology

## 2021-12-27 ENCOUNTER — Other Ambulatory Visit: Payer: Self-pay

## 2021-12-27 ENCOUNTER — Ambulatory Visit: Payer: BC Managed Care – PPO

## 2021-12-27 DIAGNOSIS — R4701 Aphasia: Secondary | ICD-10-CM

## 2021-12-27 DIAGNOSIS — I639 Cerebral infarction, unspecified: Secondary | ICD-10-CM

## 2021-12-27 DIAGNOSIS — I63312 Cerebral infarction due to thrombosis of left middle cerebral artery: Secondary | ICD-10-CM

## 2021-12-28 NOTE — Therapy (Signed)
[place] [facility]IN [facility] SERVICES [address] [place], [place], [address] Phone: [phone]   Fax:  [phone]  Speech Language Pathology Treatment  Patient Details  Name: Peter Lucas MRN: [id number] Date of Birth: 1966/04/26 Referring Provider (SLP): Alysia Penna   Encounter Date: 12/27/2021   End of Session - 12/28/21 1907     Visit Number 44    Number of Visits 2    Date for SLP Re-Evaluation 01/25/22    Authorization Type [employer]    Authorization Time Period 11/01/2021 thru 01/25/2022    Authorization - Visit Number 6    Progress Note Due on Visit 10    SLP Start Time 0900    SLP Stop Time  1000    SLP Time Calculation (min) 60 min    Activity Tolerance Patient tolerated treatment well             Past Medical History:  Diagnosis Date   DM (diabetes mellitus) ([place])    Hyperlipidemia    Hypertension     Past Surgical History:  Procedure Laterality Date   BACK SURGERY     cyst removal   IR CT HEAD LTD  05/07/2021   IR PERCUTANEOUS ART THROMBECTOMY/INFUSION INTRACRANIAL INC DIAG ANGIO  05/07/2021   RADIOLOGY WITH ANESTHESIA N/A 05/07/2021   Procedure: IR WITH ANESTHESIA;  Surgeon: Radiologist, Medication, MD;  Location: [place];  Service: Radiology;  Laterality: N/A;    There were no vitals filed for this visit.   Subjective Assessment - 12/28/21 1906     Subjective pt pleasant, had recognized someone in hallway    Patient is accompained by: Family member    Currently in Pain? No/denies                   ADULT SLP TREATMENT - 12/28/21 0001       Treatment Provided   Treatment provided Cognitive-Linquistic      Cognitive-Linquistic Treatment   Treatment focused on Aphasia;Patient/family/caregiver education    Skilled Treatment Skilled treatment session focused on improving pt's receptive language abilities as well as improving his awareness of verbal errors. SLP facilitated session by  engaging pt in telling SLP about his weekend that included his sister, niece (and her husband), his daughter and son coming over to clean out pt's attic. Pt required written cue "weekend?" to improve comprehension of verbal question "What did you do this weekend?" Pt used gestures effectively to help supplement his verbal expression. However, pt's frequent use of incorrect names and pronouns resulted in significantly decreased listener understanding. SLP facilitated by writing down names of people present - pt able to read names accurately. Continued reference to written list was needed throughout remainder of description. At the end of description, pt verbalized 2-3 sentences describing something his son did and during this group of sentences he used incorrect name Sharyn Lull) as well as incorrect pronouns (she/her). Opportunity was sought to build awareness. Pt's utterances were written down to which pt stated "that's not right" but he strongly disagreed with fact that he produced all of those errors with no awareness. [name] supportive and helped coach pt through his poor frustration tolerance.              SLP Education - 12/28/21 1907     Education Details verbal errors    Person(s) Educated Patient;Other (comment)   pt's sister   Methods Explanation;Demonstration;Verbal cues    Comprehension Need further instruction  SLP Short Term Goals - 12/08/21 0818       SLP SHORT TERM GOAL #1   Title The pt will answer simple biographical and orientation yes/no questions presented auditorily at 80% accuracy given moderate visual cues.    Time 10    Period --   sessions   Status On-going      SLP SHORT TERM GOAL #3   Title The pt will identify the correct word given 2 choices at 80% accuracy given frequent visual cues in order to increase ability to comprehend simple instructions.    Time 10    Period --   sessions   Status On-going      SLP SHORT TERM GOAL #4   Title  The pt will identify body parts at 80% accuracy given minimal cues.    Time 10    Status On-going      SLP SHORT TERM GOAL #5   Title The pt will identify the correct picture in a field of 4 when present with the word auditorily at 80% accuracy given minimal cues.    Time 10    Status On-going              SLP Long Term Goals - 12/08/21 [id number]       SLP LONG TERM GOAL #1   Title Pt will use multi-modal communication to communitcate basic wants and needs.    Time 12    Period Weeks    Status On-going    Target Date 01/25/22              Plan - 12/28/21 1908     Clinical Impression Statement Pt responds well to therapy task but continues with moderate Wernicke's aphasia. Skilled ST intervention continues to be required to increase pt's receptive language abilities and increase self-monitoring of verbal expression.    Speech Therapy Frequency 2x / week    Duration 12 weeks    Treatment/Interventions Language facilitation;Cueing hierarchy;Multimodal communcation approach;Compensatory strategies;SLP instruction and feedback;Functional tasks;Patient/family education    Potential to Achieve Goals Good    Potential Considerations Severity of impairments;Family/community support;Ability to learn/carryover information    Consulted and Agree with Plan of Care Patient;Family member/caregiver    Family Member Consulted pt's sister             Patient will benefit from skilled therapeutic intervention in order to improve the following deficits and impairments:   Aphasia  Cerebrovascular accident (CVA) due to thrombosis of left middle cerebral artery ([place])  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 Rutherford Nail M.S., [name], [facility] Pathologist Rehabilitation Services Office [phone], [place] 12/28/2021, 7:08 PM  [place] MAIN [facility] SERVICES [address] [place], [place], [address] Phone: [phone]   Fax:  [phone]   Name: Peter Lucas MRN: [id number] Date of Birth: 1966-04-01

## 2021-12-29 ENCOUNTER — Other Ambulatory Visit: Payer: Self-pay

## 2021-12-29 ENCOUNTER — Ambulatory Visit: Payer: BC Managed Care – PPO | Admitting: Physical Therapy

## 2021-12-29 ENCOUNTER — Ambulatory Visit: Payer: BC Managed Care – PPO | Admitting: Speech Pathology

## 2021-12-29 DIAGNOSIS — I639 Cerebral infarction, unspecified: Secondary | ICD-10-CM

## 2021-12-29 DIAGNOSIS — I63312 Cerebral infarction due to thrombosis of left middle cerebral artery: Secondary | ICD-10-CM

## 2021-12-29 DIAGNOSIS — R4701 Aphasia: Secondary | ICD-10-CM | POA: Diagnosis not present

## 2021-12-30 NOTE — Therapy (Signed)
[place] [facility] MAIN [facility] SERVICES [address] [place], [place], [address] Phone: [phone]   Fax:  [phone]  Speech Language Pathology Treatment  Patient Details  Name: Peter Lucas MRN: [id number] Date of Birth: 01/21/1966 Referring Provider (SLP): Claudette Laws   Encounter Date: 12/29/2021   End of Session - 12/30/21 1832     Visit Number 37    Number of Visits 45    Date for SLP Re-Evaluation 01/25/22    Authorization Type [employer]    Authorization Time Period 11/01/2021 thru 01/25/2022    Authorization - Visit Number 7    Progress Note Due on Visit 10    SLP Start Time 0900    SLP Stop Time  1000    SLP Time Calculation (min) 60 min    Activity Tolerance Patient tolerated treatment well             Past Medical History:  Diagnosis Date   DM (diabetes mellitus) (HCC)    Hyperlipidemia    Hypertension     Past Surgical History:  Procedure Laterality Date   BACK SURGERY     cyst removal   IR CT HEAD LTD  05/07/2021   IR PERCUTANEOUS ART THROMBECTOMY/INFUSION INTRACRANIAL INC DIAG ANGIO  05/07/2021   RADIOLOGY WITH ANESTHESIA N/A 05/07/2021   Procedure: IR WITH ANESTHESIA;  Surgeon: Radiologist, Medication, MD;  [facility];  Service: Radiology;  Laterality: N/A;    There were no vitals filed for this visit.   Subjective Assessment - 12/30/21 1138     Subjective "I need your help with something"    Patient is accompained by: Family member    Currently in Pain? No/denies                   ADULT SLP TREATMENT - 12/30/21 0001       Treatment Provided   Treatment provided Cognitive-Linquistic      Cognitive-Linquistic Treatment   Treatment focused on Aphasia;Patient/family/caregiver education    Skilled Treatment Skilled treatment session focused on contextual conversation regarding pt's request for help when entering ST office. With use of verbal communication supplemented by gestures  and demonstration on his cell phone, he was able to effectively communicate that he couldn't find any of his apps. While SLP assisted pt with transferring apps to his home screen, pt answered 75% of SLP's questions accurately without requiring repetition and he was able to intelligibly describe various apps. Pt with 2 semantic paraphasias throughout.              SLP Education - 12/30/21 1832     Education Details progress towards goals    Person(s) Educated Patient;Other (comment)   sister   Methods Explanation;Demonstration;Verbal cues    Comprehension Need further instruction              SLP Short Term Goals - 12/08/21 0818       SLP SHORT TERM GOAL #1   Title The pt will answer simple biographical and orientation yes/no questions presented auditorily at 80% accuracy given moderate visual cues.    Time 10    Period --   sessions   Status On-going      SLP SHORT TERM GOAL #3   Title The pt will identify the correct word given 2 choices at 80% accuracy given frequent visual cues in order to increase ability to comprehend simple instructions.    Time 10    Period --  sessions   Status On-going      SLP SHORT TERM GOAL #4   Title The pt will identify body parts at 80% accuracy given minimal cues.    Time 10    Status On-going      SLP SHORT TERM GOAL #5   Title The pt will identify the correct picture in a field of 4 when present with the word auditorily at 80% accuracy given minimal cues.    Time 10    Status On-going              SLP Long Term Goals - 12/08/21 3151       SLP LONG TERM GOAL #1   Title Pt will use multi-modal communication to communitcate basic wants and needs.    Time 12    Period Weeks    Status On-going    Target Date 01/25/22              Plan - 12/30/21 1833     Clinical Impression Statement Pt responds well to therapy tasks with great progress with receptive and expressive language use in today's session. However, he still  continues with moderate Wernicke's aphasia. Skilled ST intervention continues to be required to increase pt's receptive language abilities and increase self-monitoring of verbal expression.    Speech Therapy Frequency 2x / week    Duration 12 weeks    Treatment/Interventions Language facilitation;Cueing hierarchy;Multimodal communcation approach;Compensatory strategies;SLP instruction and feedback;Functional tasks;Patient/family education    Potential Considerations Severity of impairments;Family/community support;Ability to learn/carryover information    Consulted and Agree with Plan of Care Patient;Family member/caregiver    Family Member Consulted pt's sister             Patient will benefit from skilled therapeutic intervention in order to improve the following deficits and impairments:   Aphasia  Cerebrovascular accident (CVA) due to thrombosis of left middle cerebral artery (HCC)  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 Dreama Saa M.S., [name], [facility] Speech-Language Pathologist Rehabilitation Services Office [phone], [place] 12/30/2021, 6:34 PM  [place] [facility] MAIN [facility] SERVICES [address] [place], [place], [address] Phone: [phone]   Fax:  [phone]   Name: TOBIN WITUCKI MRN: [id number] Date of Birth: 10-01-1966

## 2022-01-03 ENCOUNTER — Ambulatory Visit: Payer: BC Managed Care – PPO | Admitting: Speech Pathology

## 2022-01-03 ENCOUNTER — Ambulatory Visit: Payer: BC Managed Care – PPO | Admitting: Physical Therapy

## 2022-01-05 ENCOUNTER — Ambulatory Visit: Payer: BC Managed Care – PPO | Attending: Physical Medicine & Rehabilitation | Admitting: Speech Pathology

## 2022-01-05 ENCOUNTER — Ambulatory Visit: Payer: BC Managed Care – PPO

## 2022-01-05 ENCOUNTER — Other Ambulatory Visit: Payer: Self-pay

## 2022-01-05 DIAGNOSIS — I63512 Cerebral infarction due to unspecified occlusion or stenosis of left middle cerebral artery: Secondary | ICD-10-CM | POA: Diagnosis present

## 2022-01-05 DIAGNOSIS — I63312 Cerebral infarction due to thrombosis of left middle cerebral artery: Secondary | ICD-10-CM | POA: Insufficient documentation

## 2022-01-05 DIAGNOSIS — I639 Cerebral infarction, unspecified: Secondary | ICD-10-CM | POA: Diagnosis present

## 2022-01-05 DIAGNOSIS — R482 Apraxia: Secondary | ICD-10-CM | POA: Diagnosis present

## 2022-01-05 DIAGNOSIS — I6602 Occlusion and stenosis of left middle cerebral artery: Secondary | ICD-10-CM | POA: Diagnosis present

## 2022-01-05 DIAGNOSIS — R4701 Aphasia: Secondary | ICD-10-CM | POA: Insufficient documentation

## 2022-01-05 NOTE — Therapy (Signed)
[place] [facility] MAIN [facility] SERVICES [address] [place], [place], [address] Phone: [phone]   Fax:  [phone]  Speech Language Pathology Treatment  Patient Details  Name: Peter Lucas MRN: [id number] Date of Birth: [birth year] Referring Provider (SLP): Claudette Laws   Encounter Date: 01/05/2022   End of Session - 01/05/22 1246     Visit Number 38    Number of Visits 45    Date for SLP Re-Evaluation 01/25/22    Authorization Type [employer]    Authorization Time Period 11/01/2021 thru 01/25/2022    Authorization - Visit Number 8    Authorization - Number of Visits 60    Progress Note Due on Visit 10    SLP Start Time 0900    SLP Stop Time  1000    SLP Time Calculation (min) 60 min    Activity Tolerance Patient tolerated treatment well             Past Medical History:  Diagnosis Date   DM (diabetes mellitus) (HCC)    Hyperlipidemia    Hypertension     Past Surgical History:  Procedure Laterality Date   BACK SURGERY     cyst removal   IR CT HEAD LTD  05/07/2021   IR PERCUTANEOUS ART THROMBECTOMY/INFUSION INTRACRANIAL INC DIAG ANGIO  05/07/2021   RADIOLOGY WITH ANESTHESIA N/A 05/07/2021   Procedure: IR WITH ANESTHESIA;  Surgeon: Radiologist, Medication, MD;  [facility];  Service: Radiology;  Laterality: N/A;    There were no vitals filed for this visit.   Subjective Assessment - 01/05/22 1244     Subjective "so I was looking on here I want to ask you a questions"  showing SLP a video on his phone    Patient is accompained by: Family member    Currently in Pain? 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; however, when cued, pt was able to state the appropriate word without having written cues x 4 - but needs less help no written cues to bring awareness; pt's conversational language contained several sematnic paraphasias (Christmas for Easter) as well as emerging alternate use of words (my ex-wife instead of listing girls names that were verbal errors)              SLP Education - 01/05/22 1246     Education Details improved speech intelligibility; sleep apnea and stroke risks    Person(s) Educated Patient;Other (comment)   pt's sister   Methods Explanation;Demonstration;Verbal cues    Comprehension Verbalized understanding;Need further instruction              SLP Short Term Goals - 12/08/21 0818       SLP SHORT TERM GOAL #1   Title The pt will answer simple biographical and orientation yes/no questions presented auditorily at 80% accuracy given moderate visual cues.    Time 10    Period --   sessions   Status On-going      SLP SHORT TERM GOAL #3   Title The pt will identify  the correct word given 2 choices at 80% accuracy given frequent visual cues in order to increase ability to comprehend simple instructions.    Time 10    Period --   sessions   Status On-going      SLP SHORT TERM GOAL #4   Title The pt will identify body parts at 80% accuracy given minimal cues.    Time 10    Status On-going      SLP SHORT TERM GOAL #5   Title The pt will identify the correct picture in a field of 4 when present with the word auditorily at 80% accuracy given minimal cues.    Time 10    Status On-going              SLP Long Term Goals - 12/08/21 8182       SLP LONG TERM GOAL #1   Title Pt will use multi-modal communication to communitcate basic wants and needs.    Time 12    Period Weeks    Status On-going    Target Date 01/25/22              Plan - 01/05/22  1247     Clinical Impression Statement Pt's speech intelligibility was greatly improved in today's session. Pt said, "see it's stuck" showing SLP his phone with pictures that were not sent,  "what do the kids play in the morning" describing recent puzzle of cartoons,  "so I was looking on here I want to ask you a question." Despite this progress, pt was not able to answer any basic yes/no questions about his therapy schedule. Skilled ST intervention continues to be required to target pt's moderate global aphasia to increase his functional independence for return to work, community and leisure activities.    Speech Therapy Frequency 2x / week    Duration 12 weeks    Treatment/Interventions Language facilitation;Cueing hierarchy;Multimodal communcation approach;Compensatory strategies;SLP instruction and feedback;Functional tasks;Patient/family education    Potential to Achieve Goals Good    Potential Considerations Severity of impairments;Family/community support;Ability to learn/carryover information    Consulted and Agree with Plan of Care Patient;Family member/caregiver    Family Member Consulted pt's sister             Patient will benefit from skilled therapeutic intervention in order to improve the following deficits and impairments:   Aphasia  Apraxia  Cerebrovascular accident (CVA) due to thrombosis of left middle cerebral artery (HCC)  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 Dreama Saa M.S., [name], [facility] Pathologist Rehabilitation Services Office [phone]  [place], [place] 01/05/2022, 12:57 PM  [place] [facility] MAIN  [facility] SERVICES [address] [place], [place], [address] Phone: [phone]   Fax:  [phone]   Name: Peter Lucas MRN: [id number] Date of Birth: 1966/10/15

## 2022-01-10 ENCOUNTER — Ambulatory Visit: Payer: BC Managed Care – PPO | Admitting: Speech Pathology

## 2022-01-10 ENCOUNTER — Ambulatory Visit: Payer: BC Managed Care – PPO

## 2022-01-10 ENCOUNTER — Other Ambulatory Visit: Payer: Self-pay

## 2022-01-10 DIAGNOSIS — R4701 Aphasia: Secondary | ICD-10-CM | POA: Diagnosis not present

## 2022-01-10 DIAGNOSIS — I63312 Cerebral infarction due to thrombosis of left middle cerebral artery: Secondary | ICD-10-CM

## 2022-01-10 DIAGNOSIS — I639 Cerebral infarction, unspecified: Secondary | ICD-10-CM

## 2022-01-11 NOTE — Therapy (Signed)
[facility] MAIN [facility] SERVICES [address] [place], [place], [address] Phone: [phone]   Fax:  [phone]  Speech Language Pathology Treatment  Patient Details  Name: Peter Lucas MRN: [id number] Date of Birth: [birth year] Referring Provider (SLP): Claudette Laws   Encounter Date: 01/10/2022   End of Session - 01/11/22 1148     Visit Number 39    Number of Visits 45    Date for SLP Re-Evaluation 01/25/22    Authorization Type [employer]    Authorization Time Period 11/01/2021 thru 01/25/2022    Authorization - Visit Number 9    Authorization - Number of Visits 51    Progress Note Due on Visit 10    SLP Start Time 0900    SLP Stop Time  1000    SLP Time Calculation (min) 60 min    Activity Tolerance Patient tolerated treatment well             Past Medical History:  Diagnosis Date   DM (diabetes mellitus) (HCC)    Hyperlipidemia    Hypertension     Past Surgical History:  Procedure Laterality Date   BACK SURGERY     cyst removal   IR CT HEAD LTD  05/07/2021   IR PERCUTANEOUS ART THROMBECTOMY/INFUSION INTRACRANIAL INC DIAG ANGIO  05/07/2021   RADIOLOGY WITH ANESTHESIA N/A 05/07/2021   Procedure: IR WITH ANESTHESIA;  Surgeon: Radiologist, Medication, MD;  Location: [facility];  Service: Radiology;  Laterality: N/A;    There were no vitals filed for this visit.   Subjective Assessment - 01/11/22 1139     Subjective "how did your games go?"    Patient is accompained by: Family member    Currently in Pain? No/denies                   ADULT SLP TREATMENT - 01/11/22 0001       Treatment Provided   Treatment provided Cognitive-Linquistic      Cognitive-Linquistic Treatment   Treatment focused on Aphasia;Patient/family/caregiver education    Skilled Treatment Targeted language regarding orientation info and times of therapy sessions using printed February calendar. Questions were initially presented to pt  with written only - What month are we in?; What year is it?; What day of the week is it?; What time is pseech today?; What is tomorrow?; What was yesterday? Pt was not able to comprehend using written only. With verbal and written, pt produced semantic paraphasias in 7 out of 8 opportunities making his responses incorrect. Pt's responses were written down for pt to see to improve awareness. He was surprised that he had said incorrect words. With increased repetition, pt was able to produce the correct answers. SLP made a video of herself asking each question and modeling each answer to allow pt opportunity to hear and practice the correct responses.              SLP Education - 01/11/22 1148     Education Details incorrect verbal responses    Person(s) Educated Patient;Other (comment)   pt's sister   Methods Explanation;Demonstration;Verbal cues;Handout    Comprehension Verbalized understanding;Need further instruction              SLP Short Term Goals - 12/08/21 0818       SLP SHORT TERM GOAL #1   Title The pt will answer simple biographical and orientation yes/no questions presented auditorily at 80% accuracy given moderate visual cues.  Time 10    Period --   sessions   Status On-going      SLP SHORT TERM GOAL #3   Title The pt will identify the correct word given 2 choices at 80% accuracy given frequent visual cues in order to increase ability to comprehend simple instructions.    Time 10    Period --   sessions   Status On-going      SLP SHORT TERM GOAL #4   Title The pt will identify body parts at 80% accuracy given minimal cues.    Time 10    Status On-going      SLP SHORT TERM GOAL #5   Title The pt will identify the correct picture in a field of 4 when present with the word auditorily at 80% accuracy given minimal cues.    Time 10    Status On-going              SLP Long Term Goals - 12/08/21 7425       SLP LONG TERM GOAL #1   Title Pt will use  multi-modal communication to communitcate basic wants and needs.    Time 12    Period Weeks    Status On-going    Target Date 01/25/22              Plan - 01/11/22 1150     Clinical Impression Statement Pt continues with moderate to severe deficits in reading as well as comprehending spoken language. Thus he is unable to answer orientation questions as well as discuss words such as DOW, MOY, year. Skilled ST intervention continues to be required to target the above mentioned deficits in an effort to improve pt's functional independence and reduce caregiver burden.    Speech Therapy Frequency 2x / week    Duration 12 weeks    Treatment/Interventions Language facilitation;Cueing hierarchy;Multimodal communcation approach;Compensatory strategies;SLP instruction and feedback;Functional tasks;Patient/family education    Potential to Achieve Goals Good    Potential Considerations Severity of impairments;Family/community support;Ability to learn/carryover information    SLP Home Exercise Plan provided, see pt instructions section    Consulted and Agree with Plan of Care Patient;Family member/caregiver    Family Member Consulted pt's sister             Patient will benefit from skilled therapeutic intervention in order to improve the following deficits and impairments:   Aphasia  Cerebrovascular accident (CVA) due to thrombosis of left middle cerebral artery (HCC)  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 Dreama Saa M.S., [name], [facility] Pathologist Rehabilitation Services Office [phone]  Reuel [place], [place] 01/11/2022, 11:52 AM  [facility] MAIN [facility] SERVICES [address] [place], [place], [address] Phone: [phone]   Fax:  [phone]   Name: Peter Lucas MRN: [id number] Date of Birth: 1966/03/24

## 2022-01-11 NOTE — Patient Instructions (Signed)
Watch video and say corresponding information

## 2022-01-12 ENCOUNTER — Ambulatory Visit: Payer: BC Managed Care – PPO

## 2022-01-12 ENCOUNTER — Other Ambulatory Visit: Payer: Self-pay

## 2022-01-12 ENCOUNTER — Ambulatory Visit: Payer: BC Managed Care – PPO | Admitting: Speech Pathology

## 2022-01-12 DIAGNOSIS — R4701 Aphasia: Secondary | ICD-10-CM | POA: Diagnosis not present

## 2022-01-12 DIAGNOSIS — I639 Cerebral infarction, unspecified: Secondary | ICD-10-CM

## 2022-01-12 DIAGNOSIS — I63512 Cerebral infarction due to unspecified occlusion or stenosis of left middle cerebral artery: Secondary | ICD-10-CM

## 2022-01-12 DIAGNOSIS — I6602 Occlusion and stenosis of left middle cerebral artery: Secondary | ICD-10-CM

## 2022-01-13 NOTE — Therapy (Signed)
[place] MAIN [facility] SERVICES [address] [place], [place], [address] Phone: [phone]   Fax:  [phone]  Speech Language Pathology Treatment  Speech Therapy Progress Note   Dates of reporting period  12/06/2021  to  01/12/2022    Patient Details  Name: Peter Lucas MRN: [id number] Date of Birth: [birth year] Referring Provider (SLP): Alysia Penna   Encounter Date: 01/12/2022  Speech Therapy Progress Note     Dates of Reporting Period: 12/06/2021 to 01/12/2022     Objective: Patient has been seen for 10 speech therapy sessions this reporting period targeting aphasia, cognitive communication, and dysarthria. Patient is making progress toward LTGs and met 2/4 STGs this reporting period. See skilled intervention, clinical impressions, and goals below for details.    End of Session - 01/13/22 0842     Visit Number 40    Number of Visits 50    Date for SLP Re-Evaluation 01/25/22    Authorization Type [employer]    Authorization Time Period 11/01/2021 thru 01/25/2022    Authorization - Visit Number 10    Authorization - Number of Visits 50    Progress Note Due on Visit 10    SLP Start Time 0900    SLP Stop Time  1000    SLP Time Calculation (min) 60 min    Activity Tolerance Patient tolerated treatment well             Past Medical History:  Diagnosis Date   DM (diabetes mellitus) ([facility])    Hyperlipidemia    Hypertension     Past Surgical History:  Procedure Laterality Date   BACK SURGERY     cyst removal   IR CT HEAD LTD  05/07/2021   IR PERCUTANEOUS ART THROMBECTOMY/INFUSION INTRACRANIAL INC DIAG ANGIO  05/07/2021   RADIOLOGY WITH ANESTHESIA N/A 05/07/2021   Procedure: IR WITH ANESTHESIA;  Surgeon: Radiologist, Medication, MD;  Location: [facility];  Service: Radiology;  Laterality: N/A;    There were no vitals filed for this visit.   Subjective Assessment - 01/13/22 0825     Subjective "I can't find  your pictures anymore, do you have, so pretty"    Patient is accompained by: Family member    Currently in Pain? No/denies                   ADULT SLP TREATMENT - 01/13/22 0001       Treatment Provided   Treatment provided Cognitive-Linquistic      Cognitive-Linquistic Treatment   Treatment focused on Aphasia;Patient/family/caregiver education    Skilled Treatment Skilled treatment session focused on understanding words surrounding bill paying. Pt requests desire to be able to understand the names of his bills. Pt showed SLP some texts, phone messages to write down list of bills and due dates. In addition to names of bills, key vocabulary words were written down to help with future bill paying - "payment due, make payment, amount due"              SLP Education - 01/13/22 0834     Education Details language around bill paying    Person(s) Educated Patient;Other (comment)   pt's sister   Methods Explanation;Demonstration;Verbal cues    Comprehension Verbalized understanding;Need further instruction              SLP Short Term Goals - 01/13/22 0902       SLP SHORT TERM GOAL #1   Title The pt will  answer simple biographical and orientation yes/no questions presented auditorily at 80% accuracy given minimal multimodal cues.    Baseline goal met, revised to reflect pt progress    Time 10    Period --   sessions   Status Revised      SLP SHORT TERM GOAL #2   Title Pt will improve speech approximation of functional language at the sentence level during 50% of conversation.    Baseline goal met, revised to reflect pt progress    Time 10    Period --   sessions   Status Revised      SLP SHORT TERM GOAL #3   Title The pt will identify the correct word given 2 choices at 80% accuracy given frequent visual cues in order to comprehend his bills.    Baseline goal met, revised to reflect pt progress    Time 10    Period --   sessions   Status Revised      SLP  SHORT TERM GOAL #4   Title Pt will copy numbers with 80% accuracy given minimal cues.    Baseline goal not met during reporting period, revised to be more functional for pt's goal of understanding his bills    Time 10    Period --   sessions   Status Revised      SLP SHORT TERM GOAL #5   Title Pt will copy name of bill with 80% accuracy.    Baseline goal not met during reporting period, revised to be more functional for pt's goal of bill paying    Time 10    Period --   sessions   Status Revised              SLP Long Term Goals - 01/13/22 0908       SLP LONG TERM GOAL #1   Title Pt will use multi-modal communication to communitcate basic wants and needs.    Target Date 01/25/22              Plan - 01/13/22 0843     Clinical Impression Statement Pt expresses desire to understanding the language around his bills, due dates and amounts. As such vocabulary was created surrounding company names and bill paying. Additionally, this Probation officer reached out via email to pt's daughter to get a list of pt's bills and their dues dates to help pt with comprehension of company names. Skilled ST intervention continues to be required to increase pt's functional independence and reduce caregiver burden.    Speech Therapy Frequency 2x / week    Duration 12 weeks    Treatment/Interventions Language facilitation;Cueing hierarchy;Multimodal communcation approach;Compensatory strategies;SLP instruction and feedback;Functional tasks;Patient/family education    Potential to Achieve Goals Good    Potential Considerations Severity of impairments;Family/community support;Ability to learn/carryover information    SLP Home Exercise Plan provided, see pt instructions section    Consulted and Agree with Plan of Care Patient;Family member/caregiver    Family Member Consulted pt's sister             Patient will benefit from skilled therapeutic intervention in order to improve the following deficits and  impairments:   Acute ischemic left MCA stroke [facility])  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 Rutherford Nail M.S., [name], [facility] Pathologist Rehabilitation Services Office [phone], [place] 01/13/2022, 9:08 AM  [place] MAIN [facility] SERVICES [address] [place], [place], [address] Phone: [phone]   Fax:  [phone]   Name: Peter Lucas MRN: [id number] Date of Birth: [birth year]

## 2022-01-17 ENCOUNTER — Other Ambulatory Visit: Payer: Self-pay

## 2022-01-17 ENCOUNTER — Ambulatory Visit: Payer: BC Managed Care – PPO

## 2022-01-17 ENCOUNTER — Ambulatory Visit: Payer: BC Managed Care – PPO | Admitting: Speech Pathology

## 2022-01-17 DIAGNOSIS — I63312 Cerebral infarction due to thrombosis of left middle cerebral artery: Secondary | ICD-10-CM

## 2022-01-17 DIAGNOSIS — R4701 Aphasia: Secondary | ICD-10-CM | POA: Diagnosis not present

## 2022-01-17 DIAGNOSIS — I639 Cerebral infarction, unspecified: Secondary | ICD-10-CM

## 2022-01-18 NOTE — Patient Instructions (Signed)
Watch video that SLP made using pt's phone, focusing on listening to each company name as well as selecting names from field of 4

## 2022-01-18 NOTE — Therapy (Signed)
[place] MAIN [facility] SERVICES [address] [place], [place], [address] Phone: [phone]   Fax:  [phone]  Speech Language Pathology Treatment  Patient Details  Name: Peter Lucas MRN: [id number] Date of Birth: [birth year] Referring Provider (SLP): Alysia Penna   Encounter Date: 01/17/2022   End of Session - 01/18/22 1224     Visit Number 72    Number of Visits 53    Date for SLP Re-Evaluation 01/25/22    Authorization Type [employer]    Authorization Time Period 11/01/2021 thru 01/25/2022    Authorization - Visit Number 1    Authorization - Number of Visits 49    Progress Note Due on Visit 10    SLP Start Time 0900    SLP Stop Time  1000    SLP Time Calculation (min) 60 min    Activity Tolerance Patient tolerated treatment well             Past Medical History:  Diagnosis Date   DM (diabetes mellitus) ([place])    Hyperlipidemia    Hypertension     Past Surgical History:  Procedure Laterality Date   BACK SURGERY     cyst removal   IR CT HEAD LTD  05/07/2021   IR PERCUTANEOUS ART THROMBECTOMY/INFUSION INTRACRANIAL INC DIAG ANGIO  05/07/2021   RADIOLOGY WITH ANESTHESIA N/A 05/07/2021   Procedure: IR WITH ANESTHESIA;  Surgeon: Radiologist, Medication, MD;  Location: [facility];  Service: Radiology;  Laterality: N/A;    There were no vitals filed for this visit.   Subjective Assessment - 01/18/22 1215     Subjective pt expressed frustration with inability to understand his doctor at his visit yesterday and frustration by not saying what he thinks he is saying.    Patient is accompained by: Family member    Currently in Pain? No/denies                   ADULT SLP TREATMENT - 01/18/22 0001       Treatment Provided   Treatment provided Cognitive-Linquistic      Cognitive-Linquistic Treatment   Treatment focused on Aphasia;Patient/family/caregiver education    Skilled Treatment Skilled treatment session  focused on understanding and expressing the language around bill paying with the specific names of his bills used. Pt required maximal mulitmodal assistance to understand therapy task - selecting business name in field of 3. He continued to respond "I don't know what you want me to do" After conditioning to task, pt required moderate assistance to [place] name from field 3.              SLP Education - 01/18/22 1224     Education Details receptive language abilities are required to be able to "talk" about his bills    Person(s) Educated Patient;Other (comment)   sister   Methods Explanation;Demonstration;Tactile cues;Verbal cues;Handout    Comprehension Need further instruction              SLP Short Term Goals - 01/13/22 0902       SLP SHORT TERM GOAL #1   Title The pt will answer simple biographical and orientation yes/no questions presented auditorily at 80% accuracy given minimal multimodal cues.    Baseline goal met, revised to reflect pt progress    Time 10    Period --   sessions   Status Revised      SLP SHORT TERM GOAL #2   Title Pt will  improve speech approximation of functional language at the sentence level during 50% of conversation.    Baseline goal met, revised to reflect pt progress    Time 10    Period --   sessions   Status Revised      SLP SHORT TERM GOAL #3   Title The pt will identify the correct word given 2 choices at 80% accuracy given frequent visual cues in order to comprehend his bills.    Baseline goal met, revised to reflect pt progress    Time 10    Period --   sessions   Status Revised      SLP SHORT TERM GOAL #4   Title Pt will copy numbers with 80% accuracy given minimal cues.    Baseline goal not met during reporting period, revised to be more functional for pt's goal of understanding his bills    Time 10    Period --   sessions   Status Revised      SLP SHORT TERM GOAL #5   Title Pt will copy name of bill with 80% accuracy.     Baseline goal not met during reporting period, revised to be more functional for pt's goal of bill paying    Time 10    Period --   sessions   Status Revised              SLP Long Term Goals - 01/13/22 0908       SLP LONG TERM GOAL #1   Title Pt will use multi-modal communication to communitcate basic wants and needs.    Target Date 01/25/22              Plan - 01/18/22 1225     Clinical Impression Statement Pt demonstrates little ability to comprehend the names of companies as well as understand due dates when written on a calendar. As such skilled ST intervention continues to be required to increase pt's functional independence and reduce caregiver burden.    Speech Therapy Frequency 2x / week    Duration 12 weeks    Treatment/Interventions Language facilitation;Cueing hierarchy;Multimodal communcation approach;Compensatory strategies;SLP instruction and feedback;Functional tasks;Patient/family education    Potential to Achieve Goals Good    Potential Considerations Severity of impairments;Family/community support;Ability to learn/carryover information    SLP Home Exercise Plan provided, see pt instructions section    Consulted and Agree with Plan of Care Patient;Family member/caregiver    Family Member Consulted pt's sister             Patient will benefit from skilled therapeutic intervention in order to improve the following deficits and impairments:   Aphasia  Cerebrovascular accident (CVA) due to thrombosis of left middle cerebral artery ([place])  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 Rutherford Nail M.S., [name], [facility]  Pathologist Rehabilitation Services Office [phone]  [name], [place] 01/18/2022, 12:27 PM  [place] MAIN [facility] SERVICES [address] [place], [place], [address] Phone: [phone]   Fax:  [phone]   Name: Peter Lucas MRN: [id number] Date of Birth: [birth year]

## 2022-01-19 ENCOUNTER — Other Ambulatory Visit: Payer: Self-pay

## 2022-01-19 ENCOUNTER — Ambulatory Visit: Payer: BC Managed Care – PPO

## 2022-01-19 ENCOUNTER — Ambulatory Visit: Payer: BC Managed Care – PPO | Admitting: Speech Pathology

## 2022-01-19 DIAGNOSIS — R4701 Aphasia: Secondary | ICD-10-CM | POA: Diagnosis not present

## 2022-01-19 DIAGNOSIS — I63312 Cerebral infarction due to thrombosis of left middle cerebral artery: Secondary | ICD-10-CM

## 2022-01-19 DIAGNOSIS — I639 Cerebral infarction, unspecified: Secondary | ICD-10-CM

## 2022-01-19 NOTE — Therapy (Signed)
[place] MAIN [facility] SERVICES [address] [place], [place], [address] Phone: [phone]   Fax:  [phone]  Speech Language Pathology Treatment  Patient Details  Name: Peter Lucas MRN: [id number] Date of Birth: 03-16-1966 Referring Provider (SLP): Alysia Penna   Encounter Date: 01/19/2022   End of Session - 01/19/22 1112     Visit Number 51    Number of Visits 24    Date for SLP Re-Evaluation 01/25/22    Authorization Type [employer]    Authorization Time Period 11/01/2021 thru 01/25/2022    Authorization - Visit Number 2    Authorization - Number of Visits 48    Progress Note Due on Visit 10    SLP Start Time 1000    SLP Stop Time  1100    SLP Time Calculation (min) 60 min    Activity Tolerance Patient tolerated treatment well             Past Medical History:  Diagnosis Date   DM (diabetes mellitus) ([place])    Hyperlipidemia    Hypertension     Past Surgical History:  Procedure Laterality Date   BACK SURGERY     cyst removal   IR CT HEAD LTD  05/07/2021   IR PERCUTANEOUS ART THROMBECTOMY/INFUSION INTRACRANIAL INC DIAG ANGIO  05/07/2021   RADIOLOGY WITH ANESTHESIA N/A 05/07/2021   Procedure: IR WITH ANESTHESIA;  Surgeon: Radiologist, Medication, MD;  Location: [place];  Service: Radiology;  Laterality: N/A;    There were no vitals filed for this visit.   Subjective Assessment - 01/19/22 1110     Subjective pt appears in better mood    Patient is accompained by: Family member    Currently in Pain? No/denies                   ADULT SLP TREATMENT - 01/19/22 0001       Treatment Provided   Treatment provided Cognitive-Linquistic      Cognitive-Linquistic Treatment   Treatment focused on Aphasia;Patient/family/caregiver education    Skilled Treatment Pt arrived to session with handouts and written notes about his recent bloodwork. Pt's daughter had written information about pt's elevated  bloodwork along with directions for taking his blood sugar. Pt stated, "this morning, it was bad, it was 99, it was really bad." With pt's permission, we called his daughter and spoke with her via speaker phone. She provides that his blood glucose meter records the reading each time it is taken and she plans to check his readings on a regular basis. While speaking with her, introduced recommendation to begin paying pt's bills in his presence for the most functional comprehension of the language surrounding his bills (names, dates, amounts). SLP further facilitated session by having pt select names of his bills from printed names in field of 8. Pt was 90% accurate, great improvement over previous session.              SLP Education - 01/19/22 1111     Education Details functaional ways to promote understanding of the language surrounding bill pay    Person(s) Educated Patient;Other (comment)   sister   Methods Explanation;Demonstration;Verbal cues;Handout    Comprehension Verbalized understanding;Need further instruction              SLP Short Term Goals - 01/13/22 0902       SLP SHORT TERM GOAL #1   Title The pt will answer simple biographical and orientation yes/no  questions presented auditorily at 80% accuracy given minimal multimodal cues.    Baseline goal met, revised to reflect pt progress    Time 10    Period --   sessions   Status Revised      SLP SHORT TERM GOAL #2   Title Pt will improve speech approximation of functional language at the sentence level during 50% of conversation.    Baseline goal met, revised to reflect pt progress    Time 10    Period --   sessions   Status Revised      SLP SHORT TERM GOAL #3   Title The pt will identify the correct word given 2 choices at 80% accuracy given frequent visual cues in order to comprehend his bills.    Baseline goal met, revised to reflect pt progress    Time 10    Period --   sessions   Status Revised      SLP  SHORT TERM GOAL #4   Title Pt will copy numbers with 80% accuracy given minimal cues.    Baseline goal not met during reporting period, revised to be more functional for pt's goal of understanding his bills    Time 10    Period --   sessions   Status Revised      SLP SHORT TERM GOAL #5   Title Pt will copy name of bill with 80% accuracy.    Baseline goal not met during reporting period, revised to be more functional for pt's goal of bill paying    Time 10    Period --   sessions   Status Revised              SLP Long Term Goals - 01/13/22 0908       SLP LONG TERM GOAL #1   Title Pt will use multi-modal communication to communitcate basic wants and needs.    Target Date 01/25/22              Plan - 01/19/22 1112     Clinical Impression Statement Pt presents great improvement in understanding the names of his bills. Very evident that pt completed his homework. Pt recently dx with very high blood sugar with new medicines added. While pt had improved islands of speech, < 25% of what he said was effective d/t paraphasias. As such skilled ST intervention continues to be required to improve pt's functional communication and reduce caregiver burden.    Speech Therapy Frequency 2x / week    Duration 12 weeks    Treatment/Interventions Language facilitation;Cueing hierarchy;Multimodal communcation approach;Compensatory strategies;SLP instruction and feedback;Functional tasks;Patient/family education    Potential to Achieve Goals Good    Potential Considerations Severity of impairments;Family/community support;Ability to learn/carryover information    SLP Home Exercise Plan provided, see pt instructions section    Consulted and Agree with Plan of Care Patient;Family member/caregiver    Family Member Consulted pt's sister             Patient will benefit from skilled therapeutic intervention in order to improve the following deficits and impairments:    Aphasia  Cerebrovascular accident (CVA) due to thrombosis of left middle cerebral artery ([place])  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 Rutherford Nail M.S., [name], [facility] Pathologist Rehabilitation Services Office [phone]  [name], [place] 01/19/2022, 11:16 AM  [place] MAIN [facility] SERVICES [address] [place], [place], [address] Phone: [phone]   Fax:  [phone]   Name: KLYDE BANKA MRN: [id number] Date of Birth: January 24, 1966

## 2022-01-24 ENCOUNTER — Ambulatory Visit: Payer: BC Managed Care – PPO

## 2022-01-24 ENCOUNTER — Other Ambulatory Visit: Payer: Self-pay

## 2022-01-24 ENCOUNTER — Ambulatory Visit: Payer: BC Managed Care – PPO | Admitting: Speech Pathology

## 2022-01-24 ENCOUNTER — Encounter: Payer: BC Managed Care – PPO | Admitting: Speech Pathology

## 2022-01-24 DIAGNOSIS — I639 Cerebral infarction, unspecified: Secondary | ICD-10-CM

## 2022-01-24 DIAGNOSIS — R4701 Aphasia: Secondary | ICD-10-CM | POA: Diagnosis not present

## 2022-01-24 DIAGNOSIS — I63312 Cerebral infarction due to thrombosis of left middle cerebral artery: Secondary | ICD-10-CM

## 2022-01-26 ENCOUNTER — Other Ambulatory Visit: Payer: Self-pay

## 2022-01-26 ENCOUNTER — Ambulatory Visit: Payer: BC Managed Care – PPO

## 2022-01-26 ENCOUNTER — Ambulatory Visit: Payer: BC Managed Care – PPO | Admitting: Speech Pathology

## 2022-01-26 DIAGNOSIS — R4701 Aphasia: Secondary | ICD-10-CM

## 2022-01-26 DIAGNOSIS — I639 Cerebral infarction, unspecified: Secondary | ICD-10-CM

## 2022-01-26 DIAGNOSIS — I63312 Cerebral infarction due to thrombosis of left middle cerebral artery: Secondary | ICD-10-CM

## 2022-01-26 DIAGNOSIS — R482 Apraxia: Secondary | ICD-10-CM

## 2022-01-26 LAB — GLUCOSE, CAPILLARY: Glucose-Capillary: 286 mg/dL — ABNORMAL HIGH (ref 70–99)

## 2022-01-26 NOTE — Therapy (Signed)
[place] [facility] MAIN [facility] SERVICES [address] [place], [place], [address] Phone: [phone]   Fax:  [phone]  Speech Language Pathology Treatment  Patient Details  Name: Peter Lucas MRN: [id number] Date of Birth: 10-Jan-1966 Referring Provider (SLP): Peter Lucas   Encounter Date: 01/26/2022   End of Session - 01/26/22 2131     Visit Number 44    Number of Visits 67    Date for SLP Re-Evaluation 04/19/22    Authorization Type [employer]    Authorization Time Period 01/24/2022 thru 04/19/2022    Authorization - Visit Number 4    Authorization - Number of Visits 45    Progress Note Due on Visit 10    SLP Start Time 1000    SLP Stop Time  1100    SLP Time Calculation (min) 60 min    Activity Tolerance Patient tolerated treatment well             Past Medical History:  Diagnosis Date   DM (diabetes mellitus) (HCC)    Hyperlipidemia    Hypertension     Past Surgical History:  Procedure Laterality Date   BACK SURGERY     cyst removal   IR CT HEAD LTD  05/07/2021   IR PERCUTANEOUS ART THROMBECTOMY/INFUSION INTRACRANIAL INC DIAG ANGIO  05/07/2021   RADIOLOGY WITH ANESTHESIA N/A 05/07/2021   Procedure: IR WITH ANESTHESIA;  Surgeon: Radiologist, Medication, MD;  [facility];  Service: Radiology;  Laterality: N/A;    There were no vitals filed for this visit.   Subjective Assessment - 01/26/22 2124     Subjective "It is broken" pt brought in his BG meter    Patient is accompained by: Family member    Currently in Pain? No/denies                   ADULT SLP TREATMENT - 01/26/22 0001       Treatment Provided   Treatment provided Cognitive-Linquistic      Cognitive-Linquistic Treatment   Treatment focused on Aphasia;Patient/family/caregiver education    Skilled Treatment Skilled treatment session focused on improving pt's comprehension of words on his blood glucose meter. Pt continued to state "it  is broken, got to be broken." Pt scanned his device and it read 276. Pt voiced that he knew this was high. He has been extremely deligint about his diet so he continued to state that it was broken and he appeared concerned that his level "has always been high, the whole time." Looking at the average, pt was correct. In an effort to help pt's comprehension, I asked a cardiac rehab nurse to take pt's blood sugar using a finger stick. Pt's blood glucose was 286. SLP spoke with pt's daughter via phone to make aware of this information.              SLP Education - 01/26/22 2131     Education Details blood glucose    Person(s) Educated Patient;Other (comment);Child(ren)   pt's sister [name] and pt's daughter [name] via phone   Methods Explanation;Demonstration;Verbal cues    Comprehension Verbalized understanding;Returned demonstration              SLP Short Term Goals - 01/25/22 1240       SLP SHORT TERM GOAL #1   Title The pt will answer simple biographical and orientation yes/no questions presented auditorily at 80% accuracy given minimal multimodal cues.    Status On-going  SLP SHORT TERM GOAL #2   Title Pt will improve speech approximation of functional language at the sentence level during 50% of conversation.    Status On-going      SLP SHORT TERM GOAL #3   Title The pt will identify the correct word given 2 choices at 80% accuracy given frequent visual cues in order to comprehend his bills.    Status On-going      SLP SHORT TERM GOAL #4   Title Pt will copy numbers with 80% accuracy given minimal cues.    Status On-going      SLP SHORT TERM GOAL #5   Title Pt will copy name of bill with 80% accuracy.    Status On-going              SLP Long Term Goals - 01/25/22 1251       SLP LONG TERM GOAL #1   Title Pt will use multi-modal communication to communitcate basic wants and needs.    Time 12    Period Weeks    Status On-going    Target Date 04/19/22               Plan - 01/26/22 2132     Clinical Impression Statement Pt demonstrated improving comprehension of    Speech Therapy Frequency 2x / week    Duration 12 weeks    Treatment/Interventions Language facilitation;Cueing hierarchy;Multimodal communcation approach;Compensatory strategies;SLP instruction and feedback;Functional tasks;Patient/family education    Potential to Achieve Goals Good    Potential Considerations Severity of impairments;Family/community support;Ability to learn/carryover information    SLP Home Exercise Plan provided, see pt instructions section    Consulted and Agree with Plan of Care Patient;Family member/caregiver    Family Member Consulted pt's sister, pt's daughter via phone             Patient will benefit from skilled therapeutic intervention in order to improve the following deficits and impairments:   Aphasia  Apraxia  Cerebrovascular accident (CVA) due to thrombosis of left middle cerebral artery (HCC)  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 Dreama Saa M.S., [name], [facility] Pathologist Rehabilitation Services Office [phone]  Reuel [place], [place] 01/26/2022, 9:46 PM  [place] [facility] MAIN [facility] SERVICES [address] [place], [place], [address] Phone: [phone]   Fax:  [phone]   Name: Peter Lucas MRN: [id number] Date of Birth: 10-31-1966

## 2022-01-26 NOTE — Therapy (Signed)
[place] [facility] MAIN [facility] SERVICES [address] [place], [place], [address] Phone: [phone]   Fax:  [phone]  Speech Language Pathology Treatment  Patient Details  Name: Peter Lucas MRN: [id number] Date of Birth: 1966-01-25 Referring Provider (SLP): Claudette Laws   Encounter Date: 01/24/2022   End of Session - 01/25/22 1238     Visit Number 43    Number of Visits 67    Date for SLP Re-Evaluation 04/19/22    Authorization Type [employer]    Authorization Time Period 01/24/2022 thru 04/19/2022    Authorization - Visit Number 3    Authorization - Number of Visits 46    Progress Note Due on Visit 10    SLP Start Time 0900    SLP Stop Time  1000    SLP Time Calculation (min) 60 min    Activity Tolerance Patient tolerated treatment well             Past Medical History:  Diagnosis Date   DM (diabetes mellitus) (HCC)    Hyperlipidemia    Hypertension     Past Surgical History:  Procedure Laterality Date   BACK SURGERY     cyst removal   IR CT HEAD LTD  05/07/2021   IR PERCUTANEOUS ART THROMBECTOMY/INFUSION INTRACRANIAL INC DIAG ANGIO  05/07/2021   RADIOLOGY WITH ANESTHESIA N/A 05/07/2021   Procedure: IR WITH ANESTHESIA;  Surgeon: Radiologist, Medication, MD;  [facility];  Service: Radiology;  Laterality: N/A;    There were no vitals filed for this visit.   Subjective Assessment - 01/25/22 1218     Subjective pt talkative, "she did bills with me"    Patient is accompained by: Family member    Currently in Pain? No/denies                   ADULT SLP TREATMENT - 01/25/22 0001       Treatment Provided   Treatment provided Cognitive-Linquistic      Cognitive-Linquistic Treatment   Treatment focused on Aphasia;Patient/family/caregiver education    Skilled Treatment Skilled treatment session focused on pt functional communication. SLP facilitated session by providing context driven conversation  with written support at the word level to help with receptive language. Pt's speech intelligibility was improved to ~ 50%. SLP further facilitated session by asking pt orientation questions with monthly calendar in front of pt. Pt was independent with all answers including what day is tomorrow and what day was yesterday.              SLP Education - 01/25/22 1237     Education Details provided    Person(s) Educated Patient;Other (comment)   pt's sister   Methods Explanation;Demonstration;Verbal cues    Comprehension Verbalized understanding;Need further instruction              SLP Short Term Goals - 01/25/22 1240       SLP SHORT TERM GOAL #1   Title The pt will answer simple biographical and orientation yes/no questions presented auditorily at 80% accuracy given minimal multimodal cues.    Status On-going      SLP SHORT TERM GOAL #2   Title Pt will improve speech approximation of functional language at the sentence level during 50% of conversation.    Status On-going      SLP SHORT TERM GOAL #3   Title The pt will identify the correct word given 2 choices at 80% accuracy given frequent visual cues  in order to comprehend his bills.    Status On-going      SLP SHORT TERM GOAL #4   Title Pt will copy numbers with 80% accuracy given minimal cues.    Status On-going      SLP SHORT TERM GOAL #5   Title Pt will copy name of bill with 80% accuracy.    Status On-going              SLP Long Term Goals - 01/25/22 1251       SLP LONG TERM GOAL #1   Title Pt will use multi-modal communication to communitcate basic wants and needs.    Time 12    Period Weeks    Status On-going    Target Date 04/19/22              Plan - 01/26/22 0704     Clinical Impression Statement Pt with great improvement when answering orientation questions. He continues to be very motivated and eager. He reports difficulty understanding his information related to new dx of diabetes and  will bring in his BGM device for assistance with understanding the language surrounding the information provided.    Speech Therapy Frequency 2x / week    Duration 12 weeks    Treatment/Interventions Language facilitation;Cueing hierarchy;Multimodal communcation approach;Compensatory strategies;SLP instruction and feedback;Functional tasks;Patient/family education    Potential to Achieve Goals Good    Potential Considerations Severity of impairments;Family/community support;Ability to learn/carryover information    SLP Home Exercise Plan provided, see pt instructions section    Consulted and Agree with Plan of Care Patient;Family member/caregiver    Family Member Consulted pt's sister             Patient will benefit from skilled therapeutic intervention in order to improve the following deficits and impairments:   Aphasia  Cerebrovascular accident (CVA) due to thrombosis of left middle cerebral artery (HCC)  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 Dreama Saa M.S., [name], [facility] Pathologist Rehabilitation Services Office [phone], [place] 01/26/2022, 7:05 AM  [place] [facility] MAIN [facility] SERVICES [address] [place], [place], [address] Phone: [phone]   Fax:  [phone]   Name: Peter Lucas MRN: [id number] Date of Birth: 12-Feb-1966

## 2022-01-31 ENCOUNTER — Emergency Department: Payer: BC Managed Care – PPO

## 2022-01-31 ENCOUNTER — Other Ambulatory Visit: Payer: Self-pay

## 2022-01-31 ENCOUNTER — Emergency Department
Admission: EM | Admit: 2022-01-31 | Discharge: 2022-01-31 | Disposition: A | Discharge status: Home / Self Care | Payer: BC Managed Care – PPO | Attending: Emergency Medicine | Admitting: Emergency Medicine

## 2022-01-31 ENCOUNTER — Ambulatory Visit: Payer: BC Managed Care – PPO | Admitting: Speech Pathology

## 2022-01-31 ENCOUNTER — Ambulatory Visit: Payer: BC Managed Care – PPO

## 2022-01-31 DIAGNOSIS — R791 Abnormal coagulation profile: Secondary | ICD-10-CM | POA: Diagnosis not present

## 2022-01-31 DIAGNOSIS — H538 Other visual disturbances: Secondary | ICD-10-CM | POA: Diagnosis not present

## 2022-01-31 DIAGNOSIS — E119 Type 2 diabetes mellitus without complications: Secondary | ICD-10-CM | POA: Insufficient documentation

## 2022-01-31 DIAGNOSIS — R4701 Aphasia: Secondary | ICD-10-CM

## 2022-01-31 DIAGNOSIS — I10 Essential (primary) hypertension: Secondary | ICD-10-CM | POA: Diagnosis not present

## 2022-01-31 DIAGNOSIS — I639 Cerebral infarction, unspecified: Secondary | ICD-10-CM

## 2022-01-31 DIAGNOSIS — I63312 Cerebral infarction due to thrombosis of left middle cerebral artery: Secondary | ICD-10-CM

## 2022-01-31 LAB — DIFFERENTIAL
Abs Immature Granulocytes: 0.03 10*3/uL (ref 0.00–0.07)
Basophils Absolute: 0.1 10*3/uL (ref 0.0–0.1)
Basophils Relative: 1 %
Eosinophils Absolute: 0.1 10*3/uL (ref 0.0–0.5)
Eosinophils Relative: 1 %
Immature Granulocytes: 0 %
Lymphocytes Relative: 20 %
Lymphs Abs: 1.4 10*3/uL (ref 0.7–4.0)
Monocytes Absolute: 0.5 10*3/uL (ref 0.1–1.0)
Monocytes Relative: 7 %
Neutro Abs: 5.3 10*3/uL (ref 1.7–7.7)
Neutrophils Relative %: 71 %

## 2022-01-31 LAB — CBC
HCT: 46.2 % (ref 39.0–52.0)
Hemoglobin: 15.4 g/dL (ref 13.0–17.0)
MCH: 27.7 pg (ref 26.0–34.0)
MCHC: 33.3 g/dL (ref 30.0–36.0)
MCV: 83.1 fL (ref 80.0–100.0)
Platelets: 268 10*3/uL (ref 150–400)
RBC: 5.56 MIL/uL (ref 4.22–5.81)
RDW: 14 % (ref 11.5–15.5)
WBC: 7.4 10*3/uL (ref 4.0–10.5)
nRBC: 0 % (ref 0.0–0.2)

## 2022-01-31 LAB — COMPREHENSIVE METABOLIC PANEL
ALT: 43 U/L (ref 0–44)
AST: 28 U/L (ref 15–41)
Albumin: 4.5 g/dL (ref 3.5–5.0)
Alkaline Phosphatase: 103 U/L (ref 38–126)
Anion gap: 12 (ref 5–15)
BUN: 24 mg/dL — ABNORMAL HIGH (ref 6–20)
CO2: 23 mmol/L (ref 22–32)
Calcium: 10 mg/dL (ref 8.9–10.3)
Chloride: 101 mmol/L (ref 98–111)
Creatinine, Ser: 1.35 mg/dL — ABNORMAL HIGH (ref 0.61–1.24)
GFR, Estimated: >60 mL/min (ref >60)
Glucose, Bld: 191 mg/dL — ABNORMAL HIGH (ref 70–99)
Potassium: 4.1 mmol/L (ref 3.5–5.1)
Sodium: 136 mmol/L (ref 135–145)
Total Bilirubin: 0.8 mg/dL (ref 0.3–1.2)
Total Protein: 9.2 g/dL — ABNORMAL HIGH (ref 6.5–8.1)

## 2022-01-31 LAB — PROTIME-INR
INR: 1.1 (ref 0.8–1.2)
Prothrombin Time: 13.8 seconds (ref 11.4–15.2)

## 2022-01-31 LAB — APTT: aPTT: 33 seconds (ref 24–36)

## 2022-01-31 MED ORDER — SODIUM CHLORIDE 0.9% FLUSH
3.0000 mL | Freq: Once | INTRAVENOUS | Status: AC
  Administered 2022-01-31: 3 mL via INTRAVENOUS

## 2022-01-31 NOTE — ED Triage Notes (Signed)
Pt was walked up with speech therapist who reports the pt has a hx of multiple strokes and is unable to understand or speak due to previous strokes, states he reports having blurred vision in the past 2 week and is unable to driver now, lives independently , ambulatory with a steady gait.

## 2022-01-31 NOTE — ED Notes (Signed)
Pt back from imaging. Pt laying calmly on stretcher; alert; visitor at bedside.

## 2022-01-31 NOTE — ED Provider Notes (Signed)
[facility] Provider Note    Event Date/Time   First MD Initiated Contact with Patient 01/31/22 1039     (approximate)  History   Chief Complaint: Blurred Vision  HPI  AMAR SIPPEL is a 56 y.o. male with a past medical history of diabetes, hypertension, hyperlipidemia who presents to the emergency department for blurred vision.  Patient has a history of a prior stroke in June of this past year complicated by aphasia.  Patient lives alone and is very high functioning despite his aphasia.  For the past 2 weeks has been complaining of difficulty seeing with blurred vision out of both eyes.  Daughter is here with the patient states recently they did change his diabetic medications due to a high A1c several weeks ago.  Daughter was concerned because the blurred vision was one of the major symptoms of the patient's stroke in June but had resolved afterwards.  Here patient is awake alert, no distress.  Does have difficulty communicating which is baseline per daughter due to the aphasia.  No distress.  Physical Exam   Triage Vital Signs: ED Triage Vitals  Enc Vitals Group     BP      Pulse      Resp      Temp      Temp src      SpO2      Weight      Height      Head Circumference      Peak Flow      Pain Score      Pain Loc      Pain Edu?      Excl. in GC?     Most recent vital signs: There were no vitals filed for this visit.  General: Awake, no distress.  CV:  Good peripheral perfusion.  Regular rate and rhythm  Resp:  Normal effort.  Equal breath sounds bilaterally.  Abd:  No distention.  Soft, nontender.  No rebound or guarding.    ED Results / Procedures / Treatments   EKG  EKG viewed and interpreted by myself shows a normal sinus rhythm at 71 bpm with a narrow QRS, normal axis, normal intervals, no concerning ST changes.  RADIOLOGY  I have personally reviewed the CT images appears to have a large MCA territory remote infarct but I do not  see any bleed or acute abnormality. Radiology is read the CT as no acute finding.  Large old left posterior MCA distribution infarct.   MEDICATIONS ORDERED IN ED: Medications  sodium chloride flush (NS) 0.9 % injection 3 mL (3 mLs Intravenous Given 01/31/22 1059)     IMPRESSION / MDM / ASSESSMENT AND PLAN / ED COURSE  I reviewed the triage vital signs and the nursing notes.  Patient presents to the emergency department for blurred vision over the past 2 weeks.  No other new neurological deficits per daughter.  Patient lives alone, and is very highly functioning despite his aphasia.  CT scan shows no significant findings.  We will check labs given his recent elevated A1c to ensure he is not suffering from significant hyperglycemia.  We will also obtain MR brain to rule out CVA.  Patient and daughter agreeable to plan of care.  Patient lab work is overall reassuring.  Normal CBC.  Chemistry showing no significant abnormality.  Glucose of 191, creatinine 1.35 which is actually improved from baseline.  Normal INR.  Pending MR imaging.  Patient's MRI is  negative for acute abnormality.  Lab work is reassuring.  Given negative MRI believe the patient would be safe for discharge home.  I discussed with the patient and daughter to follow-up with ophthalmology tomorrow for recheck and eye examination.  Patient has been taking 2 metformin pills at night and he states his symptoms seem to occur when he started this.  I did discuss with the patient to split that and take 1 in the morning 1 in the evening.  Patient has follow-up with his doctor.  FINAL CLINICAL IMPRESSION(S) / ED DIAGNOSES   blurred vision   Note:  This document was prepared using Dragon voice recognition software and may include unintentional dictation errors.   Minna Antis, MD 01/31/22 1321

## 2022-01-31 NOTE — ED Notes (Addendum)
Pt reports has taken his BP meds for today. EDP Paduchowski notified of pt's elevated BP. No new orders.

## 2022-01-31 NOTE — ED Notes (Signed)
EDP Paduchowski updating pt and visitor.

## 2022-01-31 NOTE — Therapy (Signed)
[facility] MAIN [facility] SERVICES [address] [place], [place], [address] Phone: [phone]   Fax:  [phone]  Speech Language Pathology Treatment  Patient Details  Name: Peter Lucas MRN: [id number] Date of Birth: 1966/07/18 Referring Provider (SLP): Claudette Laws   Encounter Date: 01/31/2022   End of Session - 01/31/22 1009     Visit Number 45    Number of Visits 67    Date for SLP Re-Evaluation 04/19/22    Authorization Type [employer]    Authorization Time Period 01/24/2022 thru 04/19/2022    Authorization - Visit Number 5    Authorization - Number of Visits 44    Progress Note Due on Visit 10    SLP Start Time 0900    SLP Stop Time  1000    SLP Time Calculation (min) 60 min    Activity Tolerance Patient tolerated treatment well             Past Medical History:  Diagnosis Date   DM (diabetes mellitus) (HCC)    Hyperlipidemia    Hypertension     Past Surgical History:  Procedure Laterality Date   BACK SURGERY     cyst removal   IR CT HEAD LTD  05/07/2021   IR PERCUTANEOUS ART THROMBECTOMY/INFUSION INTRACRANIAL INC DIAG ANGIO  05/07/2021   RADIOLOGY WITH ANESTHESIA N/A 05/07/2021   Procedure: IR WITH ANESTHESIA;  Surgeon: Radiologist, Medication, MD;  Location: [facility] OR;  Service: Radiology;  Laterality: N/A;    There were no vitals filed for this visit.   Subjective Assessment - 01/31/22 0958     Subjective "I can't see, if you ask me to read that (pointing to large print certification on wall), I can't see it, all of a sudden it happened, I can't see to drive, 2 weeks now"    Patient is accompained by: Family member    Currently in Pain? No/denies                   ADULT SLP TREATMENT - 01/31/22 0001       Treatment Provided   Treatment provided Cognitive-Linquistic      Cognitive-Linquistic Treatment   Treatment focused on Aphasia;Patient/family/caregiver education    Skilled Treatment Pt  arrived to session and after sitting down he continued to swith his eye glasses between his readers and prescription glasses. This is unusual for pt. He explains that he has been experiencing a sudden vision loss for ~ 2 weeks, he is unable to drive d/t acute vision deficits.              SLP Education - 01/31/22 1008     Education Details spoke with patient's daughter    Person(s) Educated Patient;Other (comment)    Methods Explanation;Demonstration;Verbal cues    Comprehension Verbalized understanding;Need further instruction              SLP Short Term Goals - 01/25/22 1240       SLP SHORT TERM GOAL #1   Title The pt will answer simple biographical and orientation yes/no questions presented auditorily at 80% accuracy given minimal multimodal cues.    Status On-going      SLP SHORT TERM GOAL #2   Title Pt will improve speech approximation of functional language at the sentence level during 50% of conversation.    Status On-going      SLP SHORT TERM GOAL #3   Title The pt will identify the correct word  given 2 choices at 80% accuracy given frequent visual cues in order to comprehend his bills.    Status On-going      SLP SHORT TERM GOAL #4   Title Pt will copy numbers with 80% accuracy given minimal cues.    Status On-going      SLP SHORT TERM GOAL #5   Title Pt will copy name of bill with 80% accuracy.    Status On-going              SLP Long Term Goals - 01/25/22 1251       SLP LONG TERM GOAL #1   Title Pt will use multi-modal communication to communitcate basic wants and needs.    Time 12    Period Weeks    Status On-going    Target Date 04/19/22              Plan - 01/31/22 1625     Clinical Impression Statement Out of an abundance caution, pt taken to ED for further evaluation.    Speech Therapy Frequency 2x / week    Duration 12 weeks    Treatment/Interventions Language facilitation;Cueing hierarchy;Multimodal communcation  approach;Compensatory strategies;SLP instruction and feedback;Functional tasks;Patient/family education    Potential to Achieve Goals Good    Potential Considerations Severity of impairments;Family/community support;Ability to learn/carryover information    Consulted and Agree with Plan of Care Patient;Family member/caregiver    Family Member Consulted pt's sister, pt's daughter via phone             Patient will benefit from skilled therapeutic intervention in order to improve the following deficits and impairments:   Aphasia  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 Dreama Saa M.S., [name], [facility] Pathologist Rehabilitation Services Office [phone], [place] 01/31/2022, 4:28 PM  [place] [facility] MAIN [facility] SERVICES [address] [place], [place], [address] Phone: [phone]   Fax:  [phone]   Name: Peter Lucas MRN: [id number] Date of Birth: [birth year]

## 2022-02-02 ENCOUNTER — Other Ambulatory Visit: Payer: Self-pay

## 2022-02-02 ENCOUNTER — Ambulatory Visit: Payer: BC Managed Care – PPO

## 2022-02-02 ENCOUNTER — Encounter: Payer: BC Managed Care – PPO | Admitting: Speech Pathology

## 2022-02-02 ENCOUNTER — Ambulatory Visit: Payer: BC Managed Care – PPO | Attending: Physical Medicine & Rehabilitation | Admitting: Speech Pathology

## 2022-02-02 DIAGNOSIS — I639 Cerebral infarction, unspecified: Secondary | ICD-10-CM | POA: Insufficient documentation

## 2022-02-02 DIAGNOSIS — R4701 Aphasia: Secondary | ICD-10-CM | POA: Diagnosis present

## 2022-02-02 DIAGNOSIS — I63312 Cerebral infarction due to thrombosis of left middle cerebral artery: Secondary | ICD-10-CM | POA: Insufficient documentation

## 2022-02-02 DIAGNOSIS — R482 Apraxia: Secondary | ICD-10-CM | POA: Insufficient documentation

## 2022-02-03 NOTE — Therapy (Signed)
[place] ?[facility]AB SERVICES ?[address] ?[place], [place], [address] ?Phone: [phone]   Fax:  [phone] ? ?Speech Language Pathology Treatment ? ?Patient Details  ?Name: Peter Lucas ?MRN: [id number] ?Date of Birth: Dec 30, 1965 ?Referring Provider (SLP): Claudette Laws ? ? ?Encounter Date: 02/02/2022 ? ? End of Session - 02/03/22 0953   ? ? Visit Number 46   ? Number of Visits 67   ? Date for SLP Re-Evaluation 04/19/22   ? Authorization Type [employer]   ? Authorization Time Period 01/24/2022 thru 04/19/2022   ? Authorization - Visit Number 6   ? Authorization - Number of Visits 43   ? Progress Note Due on Visit 10   ? SLP Start Time 1000   ? SLP Stop Time  1100   ? SLP Time Calculation (min) 60 min   ? Activity Tolerance Patient tolerated treatment well   ? ?  ?  ? ?  ? ? ?Past Medical History:  ?Diagnosis Date  ? DM (diabetes mellitus) (HCC)   ? Hyperlipidemia   ? Hypertension   ? ? ?Past Surgical History:  ?Procedure Laterality Date  ? BACK SURGERY    ? cyst removal  ? IR CT HEAD LTD  05/07/2021  ? IR PERCUTANEOUS ART THROMBECTOMY/INFUSION INTRACRANIAL INC DIAG ANGIO  05/07/2021  ? RADIOLOGY WITH ANESTHESIA N/A 05/07/2021  ? Procedure: IR WITH ANESTHESIA;  Surgeon: Radiologist, Medication, MD;  Location: [facility] OR;  Service: Radiology;  Laterality: N/A;  ? ? ?There were no vitals filed for this visit. ? ? Subjective Assessment - 02/03/22 0934   ? ? Subjective Pt had sent pictures of making Easter candy with his church to SLP and arrived with a sampling of the chocolates for SLP   ? Patient is accompained by: Family member   ? Currently in Pain? No/denies   ? ?  ?  ? ?  ? ? ? ? ? ? ? ? ADULT SLP TREATMENT - 02/03/22 0001   ? ?  ? Treatment Provided  ? Treatment provided Cognitive-Linquistic   ?  ? Cognitive-Linquistic Treatment  ? Treatment focused on Aphasia;Patient/family/caregiver education   ? Skilled Treatment Skilled treatment session focused on facilitation of  receptive and expressive language skills through context based familiar and unfamiliar conversational topics. Pt used his phone to locate the flavors of each chocolate as his daughter had texted it to him. 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 He also self-corrected when he stated "church" instead of "hospital" and he correctly recall and explained that ED physician recommended splitting dose of metformin rather than taking a double dose as previously prescribed. SLP further facilitated session by engaging pt in unfamiliar conversational topic. Pt required initial 2 word written aid but able to comprehend SLP's topic. In response, pt able to express comments with ~ 75% intelligibility with clarification clues from his sister.   ? ?  ?  ? ?  ? ? ? SLP Education - 02/03/22 0952   ? ? Education Details provided   ? Person(s) Educated Patient;Other (comment)   sister  ? Methods Explanation;Demonstration;Verbal cues   ? Comprehension Verbalized understanding;Returned demonstration;Need further instruction   ? ?  ?  ? ?  ? ? ? SLP Short Term Goals - 01/25/22 1240   ? ?  ? SLP SHORT TERM GOAL #1  ? Title The pt will answer simple biographical and orientation yes/no  questions presented auditorily at 80% accuracy given minimal multimodal cues.   ? Status On-going   ?  ? SLP SHORT TERM GOAL #2  ? Title Pt will improve speech approximation of functional language at the sentence level during 50% of conversation.   ? Status On-going   ?  ? SLP SHORT TERM GOAL #3  ? Title The pt will identify the correct word given 2 choices at 80% accuracy given frequent visual cues in order to comprehend his bills.   ? Status On-going   ?  ? SLP SHORT TERM GOAL #4  ? Title Pt will copy numbers with 80% accuracy given minimal cues.   ? Status On-going   ?  ? SLP SHORT TERM GOAL #5  ? Title Pt will copy name of bill with 80% accuracy.   ? Status On-going   ? ?  ?  ? ?  ? ? ? SLP  Long Term Goals - 01/25/22 1251   ? ?  ? SLP LONG TERM GOAL #1  ? Title Pt will use multi-modal communication to communitcate basic wants and needs.   ? Time 12   ? Period Weeks   ? Status On-going   ? Target Date 04/19/22   ? ?  ?  ? ?  ? ? ? Plan - 02/03/22 0953   ? ? Clinical Impression Statement Pt expresses gratitude for staff and treatment received while in ED. He continues to benefit from context based therapy approach as well as functional drill with video support for practice at home.   ? Speech Therapy Frequency 2x / week   ? Duration 12 weeks   ? Treatment/Interventions Language facilitation;Cueing hierarchy;Multimodal communcation approach;Compensatory strategies;SLP instruction and feedback;Functional tasks;Patient/family education   ? Potential to Achieve Goals Good   ? Potential Considerations Severity of impairments;Family/community support;Ability to learn/carryover information   ? SLP Home Exercise Plan provided, see pt instructions section   ? Consulted and Agree with Plan of Care Patient;Family member/caregiver   ? Family Member Consulted pt's sister   ? ?  ?  ? ?  ? ? ?Patient will benefit from skilled therapeutic intervention in order to improve the following deficits and impairments:   ?Aphasia ? ?Apraxia ? ?Cerebrovascular accident (CVA) due to thrombosis of left middle cerebral artery (HCC) ? 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 ? ? ?Problem List ?Patient Active Problem List  ? Diagnosis Date Noted  ? 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 Stroke (HCC) 05/07/2021  ? Acute ischemic left MCA stroke (HCC) 05/07/2021  ? 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 ?Kristle Wesch B. Dreama Saa, M.S., [name], CBIS ?Speech-Language Pathologist ?Rehabilitation Services ?Office [phone] ? ?Adhvik Canady, [name] ?02/03/2022, 9:55 AM ? ?[place] ?[facility] MA[facility] REHAB SERVICES ?[address] ?[place],  [place], [address] ?Phone: [phone]   Fax:  [phone] ? ? ?Name: Peter Lucas ?MRN: [id number] ?Date of Birth: 08/27/1966 ? ?

## 2022-02-06 ENCOUNTER — Ambulatory Visit: Payer: BC Managed Care – PPO | Admitting: Speech Pathology

## 2022-02-06 ENCOUNTER — Other Ambulatory Visit: Payer: Self-pay

## 2022-02-06 DIAGNOSIS — R4701 Aphasia: Secondary | ICD-10-CM | POA: Diagnosis not present

## 2022-02-06 DIAGNOSIS — I639 Cerebral infarction, unspecified: Secondary | ICD-10-CM

## 2022-02-06 DIAGNOSIS — I63312 Cerebral infarction due to thrombosis of left middle cerebral artery: Secondary | ICD-10-CM

## 2022-02-07 NOTE — Therapy (Signed)
[place] ?[facility] REHAB SERVICES ?[address] ?[place], [place], [address] ?Phone: [phone]   Fax:  [phone] ? ?Speech Language Pathology Treatment ? ?Patient Details  ?Name: Peter Lucas ?MRN: [id number] ?Date of Birth: Dec 22, 1965 ?Referring Provider (SLP): Claudette Laws ? ? ?Encounter Date: 02/06/2022 ? ? ? ?Past Medical History:  ?Diagnosis Date  ? DM (diabetes mellitus) (HCC)   ? Hyperlipidemia   ? Hypertension   ? ? ?Past Surgical History:  ?Procedure Laterality Date  ? BACK SURGERY    ? cyst removal  ? IR CT HEAD LTD  05/07/2021  ? IR PERCUTANEOUS ART THROMBECTOMY/INFUSION INTRACRANIAL INC DIAG ANGIO  05/07/2021  ? RADIOLOGY WITH ANESTHESIA N/A 05/07/2021  ? Procedure: IR WITH ANESTHESIA;  Surgeon: Radiologist, Medication, MD;  [facility]: [facility] OR;  Service: Radiology;  Laterality: N/A;  ? ? ?There were no vitals filed for this visit. ? ? Subjective Assessment - 02/06/22 1541   ? ? Subjective "how are you?"   ? Patient is accompained by: Family member   ? Currently in Pain? No/denies   ? ?  ?  ? ?  ? ? ? ? ? ? ? ? ADULT SLP TREATMENT - 02/07/22 0001   ? ?  ? Treatment Provided  ? Treatment provided Cognitive-Linquistic   ?  ? Cognitive-Linquistic Treatment  ? Treatment focused on Aphasia;Patient/family/caregiver education   ? Skilled Treatment Skilled treatment session focused on improving pt's receptive and expressive language skills.   ? ?  ?  ? ?  ? ? ? ? ? SLP Short Term Goals - 01/25/22 1240   ? ?  ? SLP SHORT TERM GOAL #1  ? Title The pt will answer simple biographical and orientation yes/no questions presented auditorily at 80% accuracy given minimal multimodal cues.   ? Status On-going   ?  ? SLP SHORT TERM GOAL #2  ? Title Pt will improve speech approximation of functional language at the sentence level during 50% of conversation.   ? Status On-going   ?  ? SLP SHORT TERM GOAL #3  ? Title The pt will identify the correct word given 2 choices at 80% accuracy given  frequent visual cues in order to comprehend his bills.   ? Status On-going   ?  ? SLP SHORT TERM GOAL #4  ? Title Pt will copy numbers with 80% accuracy given minimal cues.   ? Status On-going   ?  ? SLP SHORT TERM GOAL #5  ? Title Pt will copy name of bill with 80% accuracy.   ? Status On-going   ? ?  ?  ? ?  ? ? ? SLP Long Term Goals - 01/25/22 1251   ? ?  ? SLP LONG TERM GOAL #1  ? Title Pt will use multi-modal communication to communitcate basic wants and needs.   ? Time 12   ? Period Weeks   ? Status On-going   ? Target Date 04/19/22   ? ?  ?  ? ?  ? ? ? ? ?Patient will benefit from skilled therapeutic intervention in order to improve the following deficits and impairments:   ?Aphasia ? ?Cerebrovascular accident (CVA) due to thrombosis of left middle cerebral artery (HCC) ? 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 ? ? ?Problem List ?Patient Active Problem List  ? Diagnosis Date Noted  ? 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 Stroke (HCC) 05/07/2021  ? Acute ischemic  left MCA stroke (HCC) 05/07/2021  ? 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 ? ?Keatyn Luck Dreama Saa, [name] ?02/07/2022, 6:58 PM ? ?[place] ?[facility] MAIN REHAB SERVICES ?[address] ?[place], [place], [address] ?Phone: [phone]   Fax:  [phone] ? ? ?Name: Peter Lucas ?MRN: [id number] ?Date of Birth: Jan 23, 1966 ? ?

## 2022-02-08 ENCOUNTER — Other Ambulatory Visit: Payer: Self-pay

## 2022-02-08 ENCOUNTER — Ambulatory Visit: Payer: BC Managed Care – PPO | Admitting: Speech Pathology

## 2022-02-08 DIAGNOSIS — R4701 Aphasia: Secondary | ICD-10-CM | POA: Diagnosis not present

## 2022-02-08 DIAGNOSIS — I639 Cerebral infarction, unspecified: Secondary | ICD-10-CM

## 2022-02-08 DIAGNOSIS — I63312 Cerebral infarction due to thrombosis of left middle cerebral artery: Secondary | ICD-10-CM

## 2022-02-08 NOTE — Therapy (Signed)
[place] ?[facility] MA[facility] REHAB SERVICES ?[address] ?[place], [place], [address] ?Phone: [phone]   Fax:  [phone] ? ?Speech Language Pathology Treatment ? ?Patient Details  ?Name: Peter Lucas ?MRN: [id number] ?Date of Birth: 07/13/1966 ?Referring Provider (SLP): Claudette Laws ? ? ?Encounter Date: 02/08/2022 ? ? End of Session - 02/08/22 1435   ? ? Visit Number 48   ? Number of Visits 67   ? Date for SLP Re-Evaluation 04/19/22   ? Authorization Type [employer]   ? Authorization Time Period 01/24/2022 thru 04/19/2022   ? Authorization - Visit Number 8   ? Authorization - Number of Visits 41   ? Progress Note Due on Visit 10   ? SLP Start Time 0900   ? SLP Stop Time  1000   ? SLP Time Calculation (min) 60 min   ? Activity Tolerance Patient tolerated treatment well   ? ?  ?  ? ?  ? ? ?Past Medical History:  ?Diagnosis Date  ? DM (diabetes mellitus) (HCC)   ? Hyperlipidemia   ? Hypertension   ? ? ?Past Surgical History:  ?Procedure Laterality Date  ? BACK SURGERY    ? cyst removal  ? IR CT HEAD LTD  05/07/2021  ? IR PERCUTANEOUS ART THROMBECTOMY/INFUSION INTRACRANIAL INC DIAG ANGIO  05/07/2021  ? RADIOLOGY WITH ANESTHESIA N/A 05/07/2021  ? Procedure: IR WITH ANESTHESIA;  Surgeon: Radiologist, Medication, MD;  [facility]: [facility] OR;  Service: Radiology;  Laterality: N/A;  ? ? ?There were no vitals filed for this visit. ? ? Subjective Assessment - 02/08/22 1430   ? ? Subjective "hey, what's up?"   ? Patient is accompained by: Family member   ? Currently in Pain? No/denies   ? ?  ?  ? ?  ? ? ? ? ? ? ? ? ADULT SLP TREATMENT - 02/08/22 0001   ? ?  ? Treatment Provided  ? Treatment provided Cognitive-Linquistic   ?  ? Cognitive-Linquistic Treatment  ? Treatment focused on Aphasia;Patient/family/caregiver education   ? Skilled Treatment Skilled treatment session focused on context based language conversation. SLP initially facilitated session by using verbal expression only to ask pt "do  you have pictures of the candy?" (Pt was going to take pictures of the candy making process at his church). Despite multiple alternative ways to describe pictures, SLP finally had to say, "Let me see your phone" for pt to understand that I was asking about pictures. Pt provided the following responses in description of the candy making process: There are 3 girls and you had a mixer, they start making them, mixing them,     And how they make it, roll it out, and they just start cutting them (using gestures), cookie cutter    They keep them hot, one woman brings the hot one and takes the cold one    How many did they make them? 2000 last week, required cues - last week or yesterday, no 2000 yesterday." Continued written cues were required throughout session to increase receptive language abilities.   ? ?  ?  ? ?  ? ? ? SLP Education - 02/08/22 1434   ? ? Education Details improved islands of speech   ? Person(s) Educated Patient   sister  ? Methods Explanation;Demonstration;Verbal cues   ? Comprehension Verbalized understanding;Returned demonstration;Need further instruction   ? ?  ?  ? ?  ? ? ? SLP Short Term Goals - 01/25/22 1240   ? ?  ?  SLP SHORT TERM GOAL #1  ? Title The pt will answer simple biographical and orientation yes/no questions presented auditorily at 80% accuracy given minimal multimodal cues.   ? Status On-going   ?  ? SLP SHORT TERM GOAL #2  ? Title Pt will improve speech approximation of functional language at the sentence level during 50% of conversation.   ? Status On-going   ?  ? SLP SHORT TERM GOAL #3  ? Title The pt will identify the correct word given 2 choices at 80% accuracy given frequent visual cues in order to comprehend his bills.   ? Status On-going   ?  ? SLP SHORT TERM GOAL #4  ? Title Pt will copy numbers with 80% accuracy given minimal cues.   ? Status On-going   ?  ? SLP SHORT TERM GOAL #5  ? Title Pt will copy name of bill with 80% accuracy.   ? Status On-going   ? ?  ?  ? ?   ? ? ? SLP Long Term Goals - 01/25/22 1251   ? ?  ? SLP LONG TERM GOAL #1  ? Title Pt will use multi-modal communication to communitcate basic wants and needs.   ? Time 12   ? Period Weeks   ? Status On-going   ? Target Date 04/19/22   ? ?  ?  ? ?  ? ? ? Plan - 02/08/22 1435   ? ? Clinical Impression Statement Pt continues to present with moderate Wernicke's aphasia but improved islands of speech intelligibility. During this session, pt was describing recent Pulmnology appt and after cued he began to self-correcthis incorrect use of pronouns. Pt ocntinues to require written prompts to supplement context based therapy. Continued skilled ST intervention is recommended to treat the above impairments to increase pt's functional independence and reduce caregiver burden and possible return to work.   ? Speech Therapy Frequency 2x / week   ? Duration 12 weeks   ? Treatment/Interventions Language facilitation;Cueing hierarchy;Multimodal communcation approach;Compensatory strategies;SLP instruction and feedback;Functional tasks;Patient/family education   ? Potential to Achieve Goals Good   ? Potential Considerations Severity of impairments;Family/community support;Ability to learn/carryover information   ? Consulted and Agree with Plan of Care Patient;Family member/caregiver   ? Family Member Consulted pt's sister   ? ?  ?  ? ?  ? ? ?Patient will benefit from skilled therapeutic intervention in order to improve the following deficits and impairments:   ?Aphasia ? ?Cerebrovascular accident (CVA) due to thrombosis of left middle cerebral artery (HCC) ? 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 ? ? ?Problem List ?Patient Active Problem List  ? Diagnosis Date Noted  ? 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 Stroke (HCC) 05/07/2021  ? Acute ischemic left MCA stroke (HCC) 05/07/2021  ? 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 ?Claramae Rigdon B. Dreama Saa, M.S., [name],  CBIS ?Speech-Language Pathologist ?Rehabilitation Services ?Office [phone] ? ?Yisroel Mullendore, [name] ?02/08/2022, 2:39 PM ? ?[place] ?[facility] ?[address] ?[place], [place], [address] ?Phone: [phone]   Fax:  [phone] ? ? ?Name: Peter Lucas ?MRN: [id number] ?Date of Birth: 12/05/1965 ? ?

## 2022-02-13 ENCOUNTER — Other Ambulatory Visit: Payer: Self-pay

## 2022-02-13 ENCOUNTER — Ambulatory Visit: Payer: BC Managed Care – PPO | Admitting: Speech Pathology

## 2022-02-13 DIAGNOSIS — R4701 Aphasia: Secondary | ICD-10-CM | POA: Diagnosis not present

## 2022-02-13 DIAGNOSIS — I63312 Cerebral infarction due to thrombosis of left middle cerebral artery: Secondary | ICD-10-CM

## 2022-02-13 DIAGNOSIS — I639 Cerebral infarction, unspecified: Secondary | ICD-10-CM

## 2022-02-14 NOTE — Therapy (Signed)
[place] ?[facility] ?[address] ?[place], [place], [address] ?Phone: [phone]   Fax:  [phone] ? ?Speech Language Pathology Treatment ? ?Patient Details  ?Name: Peter Lucas ?MRN: [id number] ?Date of Birth: 1966/11/11 ?Referring Provider (SLP): Claudette Laws ? ? ?Encounter Date: 02/13/2022 ? ? End of Session - 02/14/22 0951   ? ? Visit Number 49   ? Number of Visits 67   ? Date for SLP Re-Evaluation 04/19/22   ? Authorization Type [employer]   ? Authorization Time Period 01/24/2022 thru 04/19/2022   ? Authorization - Visit Number 9   ? Authorization - Number of Visits 40   ? Progress Note Due on Visit 10   ? SLP Start Time 0900   ? SLP Stop Time  1000   ? SLP Time Calculation (min) 60 min   ? Activity Tolerance Patient tolerated treatment well   ? ?  ?  ? ?  ? ? ?Past Medical History:  ?Diagnosis Date  ? DM (diabetes mellitus) (HCC)   ? Hyperlipidemia   ? Hypertension   ? ? ?Past Surgical History:  ?Procedure Laterality Date  ? BACK SURGERY    ? cyst removal  ? IR CT HEAD LTD  05/07/2021  ? IR PERCUTANEOUS ART THROMBECTOMY/INFUSION INTRACRANIAL INC DIAG ANGIO  05/07/2021  ? RADIOLOGY WITH ANESTHESIA N/A 05/07/2021  ? Procedure: IR WITH ANESTHESIA;  Surgeon: Radiologist, Medication, MD;  [facility]: [facility];  Service: Radiology;  Laterality: N/A;  ? ? ?There were no vitals filed for this visit. ? ? Subjective Assessment - 02/14/22 0947   ? ? Subjective "what's up"   ? Patient is accompained by: Family member   ? Currently in Pain? No/denies   ? ?  ?  ? ?  ? ? ? ? ? ? ? ? ADULT SLP TREATMENT - 02/14/22 0001   ? ?  ? Treatment Provided  ? Treatment provided Cognitive-Linquistic   ?  ? Cognitive-Linquistic Treatment  ? Treatment focused on Aphasia;Patient/family/caregiver education   ? Skilled Treatment Skilled treatment session focused on pt's language impairments, specifically functional expressive and receptive language during contextually based  conversation.  ? ?SLP facilitated session by providing the following interventions:     ? ?Comprehension of verbal statements/questions by SLP:   2 out of 10; improving to 4 out of 10 with written word level cue; improving to 9 out of 10 with phrase level written cue     ? ?Verbal Expression: incorrect use of pronouns and semantic paraphasias resulted in 50% listener understanding, improving to 75% listener understanding after written cues to build awareness to the above errors.      ? ?Strategies to help during moments of word finding difficulty: despite maximal multimodal cues, pt did appear to comprehend strategies such as writing down, visualization   ? ?  ?  ? ?  ? ? ? SLP Education - 02/14/22 0950   ? ? Education Details strategies to use during moments of word finding difficulty   ? Person(s) Educated Patient;Other (comment)   pt's sister  ? Methods Explanation;Demonstration;Verbal cues   ? Comprehension Need further instruction   ? ?  ?  ? ?  ? ? ? SLP Short Term Goals - 01/25/22 1240   ? ?  ? SLP SHORT TERM GOAL #1  ? Title The pt will answer simple biographical and orientation yes/no questions presented auditorily at 80% accuracy given minimal multimodal cues.   ? Status  On-going   ?  ? SLP SHORT TERM GOAL #2  ? Title Pt will improve speech approximation of functional language at the sentence level during 50% of conversation.   ? Status On-going   ?  ? SLP SHORT TERM GOAL #3  ? Title The pt will identify the correct word given 2 choices at 80% accuracy given frequent visual cues in order to comprehend his bills.   ? Status On-going   ?  ? SLP SHORT TERM GOAL #4  ? Title Pt will copy numbers with 80% accuracy given minimal cues.   ? Status On-going   ?  ? SLP SHORT TERM GOAL #5  ? Title Pt will copy name of bill with 80% accuracy.   ? Status On-going   ? ?  ?  ? ?  ? ? ? SLP Long Term Goals - 01/25/22 1251   ? ?  ? SLP LONG TERM GOAL #1  ? Title Pt will use multi-modal communication to communitcate basic  wants and needs.   ? Time 12   ? Period Weeks   ? Status On-going   ? Target Date 04/19/22   ? ?  ?  ? ?  ? ? ? Plan - 02/14/22 0951   ? ? Clinical Impression Statement Pt continues with moderate Wernicke's aphasia. Pt benefitted from written cues during contextual conversation to bring awareness to expressive errors. I recommend continued skilled ST to improve intelligibility, verbal expression and swallowing safety necessary for increased communicatory success and independence in the home and community environments.   ? Speech Therapy Frequency 2x / week   ? Duration 12 weeks   ? Treatment/Interventions Language facilitation;Cueing hierarchy;Multimodal communcation approach;Compensatory strategies;SLP instruction and feedback;Functional tasks;Patient/family education   ? Potential to Achieve Goals Good   ? Potential Considerations Severity of impairments;Family/community support;Ability to learn/carryover information   ? Consulted and Agree with Plan of Care Patient;Family member/caregiver   ? Family Member Consulted pt's sister   ? ?  ?  ? ?  ? ? ?Patient will benefit from skilled therapeutic intervention in order to improve the following deficits and impairments:   ?Aphasia ? ?Cerebrovascular accident (CVA) due to thrombosis of left middle cerebral artery (HCC) ? 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 ? ? ?Problem List ?Patient Active Problem List  ? Diagnosis Date Noted  ? 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 Stroke (HCC) 05/07/2021  ? Acute ischemic left MCA stroke (HCC) 05/07/2021  ? 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 ?Peter Philyaw B. Peter Lucas, M.S., [name], CBIS ?Speech-Language Pathologist ?Rehabilitation Services ?Office [phone] ? ?Loyd Marhefka, [name] ?02/14/2022, 9:52 AM ? ?[place] ?[facility] ?[address] ?[place], [place], [address] ?Phone: [phone]   Fax:   [phone] ? ? ?Name: Peter Lucas ?MRN: [id number] ?Date of Birth: 1966/10/17 ? ?

## 2022-02-15 ENCOUNTER — Other Ambulatory Visit: Payer: Self-pay

## 2022-02-15 ENCOUNTER — Ambulatory Visit: Payer: BC Managed Care – PPO | Admitting: Speech Pathology

## 2022-02-15 DIAGNOSIS — R4701 Aphasia: Secondary | ICD-10-CM | POA: Diagnosis not present

## 2022-02-16 NOTE — Therapy (Signed)
[place] ?[facility] ?[place], [place], [address] ?Phone: [phone]   Fax:  [phone] ? ?Speech Language Pathology Treatment ?Speech Therapy Progress Note ? ? ?Dates of reporting period  01/12/2022   to   02/15/2022 ? ? ?Patient Details  ?Name: Peter Lucas ?MRN: [id number] ?Date of Birth: 02-28-1966 ?Referring Provider (SLP): Alysia Penna ? ? ?Encounter Date: 02/15/2022 ? ? End of Session - 02/16/22 1812   ? ? Visit Number 50   ? Number of Visits 67   ? Date for SLP Re-Evaluation 04/19/22   ? Authorization Type [employer]   ? Authorization Time Period 01/24/2022 thru 04/19/2022   ? Authorization - Visit Number 10   ? Authorization - Number of Visits 39   ? Progress Note Due on Visit 10   ? SLP Start Time 0900   ? SLP Stop Time  1000   ? SLP Time Calculation (min) 60 min   ? Activity Tolerance Patient tolerated treatment well   ? ?  ?  ? ?  ? ? ?Past Medical History:  ?Diagnosis Date  ? DM (diabetes mellitus) ([place])   ? Hyperlipidemia   ? Hypertension   ? ? ?Past Surgical History:  ?Procedure Laterality Date  ? BACK SURGERY    ? cyst removal  ? IR CT HEAD LTD  05/07/2021  ? IR PERCUTANEOUS ART THROMBECTOMY/INFUSION INTRACRANIAL INC DIAG ANGIO  05/07/2021  ? RADIOLOGY WITH ANESTHESIA N/A 05/07/2021  ? Procedure: IR WITH ANESTHESIA;  Surgeon: Radiologist, Medication, MD;  Location: [facility];  Service: Radiology;  Laterality: N/A;  ? ? ?There were no vitals filed for this visit. ? ? Subjective Assessment - 02/16/22 1806   ? ? Subjective :"how are you?"   ? Patient is accompained by: Family member   ? Currently in Pain? No/denies   ? ?  ?  ? ?  ? ? ? ? ? ? ? ? ADULT SLP TREATMENT - 02/16/22 0001   ? ?  ? Cognitive-Linquistic Treatment  ? Treatment focused on Aphasia;Patient/family/caregiver education   ? Skilled Treatment Skilled treatment session focused on pt's language impairments, specifically functional expressive and receptive language during  contextually based conversation.  ? ?SLP facilitated session by providing the following interventions:     ? ?Comprehension of verbal statements/questions by SLP:   5 out of 10; improving to 7 out of 10 with written word level cue ? ?Verbal Expression: intermittent cues for correct pronoun use resulting in 75% listener understanding after written cues to build awareness to the above errors.      ? ?  ? ?  ?  ? ?  ? ? ? SLP Education - 02/16/22 1807   ? ? Education Details provided   ? Person(s) Educated Patient;Other (comment)   pt's sister  ? Methods Explanation;Demonstration;Verbal cues   ? Comprehension Verbalized understanding;Need further instruction   ? ?  ?  ? ?  ? ? ? SLP Short Term Goals - 02/16/22 1812   ? ?  ? SLP SHORT TERM GOAL #1  ? Title The pt will answer simple biographical and orientation yes/no questions presented auditorily at 80% accuracy given minimal multimodal cues.   ? Time 10   ? Period --   sessions  ? Status Partially Met   ?  ? SLP SHORT TERM GOAL #2  ? Title Pt will improve speech approximation of functional language at the sentence level during 50% of conversation.   ?  Time 10   ? Period --   sessions  ? Status Partially Met   ?  ? SLP SHORT TERM GOAL #3  ? Title The pt will identify the correct word given 2 choices at 80% accuracy given frequent visual cues in order to comprehend his bills.   ? Status Achieved   ?  ? SLP SHORT TERM GOAL #4  ? Title Pt will copy numbers with 80% accuracy given minimal cues.   ? Time 10   ? Period --   sessions  ? Status On-going   ?  ? SLP SHORT TERM GOAL #5  ? Title Pt will copy name of bill with 80% accuracy.   ? Time 10   ? Period --   sessions  ? Status On-going   ? ?  ?  ? ?  ? ? ? SLP Long Term Goals - 02/16/22 1815   ? ?  ? SLP LONG TERM GOAL #1  ? Title Pt will use multi-modal communication to communitcate basic wants and needs.   ? Status On-going   ? Target Date 04/19/22   ? ?  ?  ? ?  ? ? ? Plan - 02/16/22 1816   ? ? Clinical Impression  Statement Pt continues to be eager and participates fully in all sessions. As a result, he is making great progress towards his STG and LTGs. Despite thise, he continues with moderate Wernicke's aphasia. Pt benefits from written cues during contextual conversation to bring awareness to expressive errors. I recommend continued skilled ST to improve intelligibility, verbal expression and swallowing safety necessary for increased communicatory success and independence in the home and community environments.   ? Speech Therapy Frequency 2x / week   ? Duration 12 weeks   ? Treatment/Interventions Language facilitation;Cueing hierarchy;Multimodal communcation approach;Compensatory strategies;SLP instruction and feedback;Functional tasks;Patient/family education   ? Potential to Achieve Goals Good   ? Potential Considerations Severity of impairments;Family/community support;Ability to learn/carryover information   ? Consulted and Agree with Plan of Care Patient;Family member/caregiver   ? Family Member Consulted pt's sister   ? ?  ?  ? ?  ? ? ?Patient will benefit from skilled therapeutic intervention in order to improve the following deficits and impairments:   ?Aphasia ? ?Apraxia ? ?Cerebrovascular accident (CVA) due to thrombosis of left middle cerebral artery ([place]) ? 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 ? ? ?Problem List ?Patient Active Problem List  ? Diagnosis Date Noted  ? 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 ([place]) 05/12/2021  ? Stroke ([place]) 05/07/2021  ? Acute ischemic left MCA stroke ([place]) 05/07/2021  ? 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 ?Ahliyah Nienow B. Rutherford Nail, M.S., [name], CBIS ?Speech-Language Pathologist ?Rehabilitation Services ?Office [phone] ? ?Tanayah Squitieri Rutherford Nail, [name] ?02/16/2022, 6:17 PM ? ?[place] ?[facility] ?[place], [place], [address] ?Phone: [phone]   Fax:   [phone] ? ? ?Name: Peter Lucas ?MRN: [id number] ?Date of Birth: [birth year] ? ?

## 2022-02-20 ENCOUNTER — Ambulatory Visit: Payer: BC Managed Care – PPO | Admitting: Speech Pathology

## 2022-02-20 ENCOUNTER — Other Ambulatory Visit: Payer: Self-pay

## 2022-02-20 ENCOUNTER — Ambulatory Visit: Payer: BC Managed Care – PPO | Admitting: Adult Health

## 2022-02-20 DIAGNOSIS — R4701 Aphasia: Secondary | ICD-10-CM

## 2022-02-20 DIAGNOSIS — I63312 Cerebral infarction due to thrombosis of left middle cerebral artery: Secondary | ICD-10-CM

## 2022-02-20 DIAGNOSIS — I639 Cerebral infarction, unspecified: Secondary | ICD-10-CM

## 2022-02-20 DIAGNOSIS — R482 Apraxia: Secondary | ICD-10-CM

## 2022-02-21 NOTE — Patient Instructions (Signed)
Copy the alphabet and the names of his family members ?

## 2022-02-21 NOTE — Therapy (Signed)
?[facility] ?[place], [place], [address] ?Phone: [phone]   Fax:  [phone] ? ?Speech Language Pathology Treatment ? ?Patient Details  ?Name: Peter Lucas ?MRN: [id number] ?Date of Birth: 21-Dec-1965 ?Referring Provider (SLP): Alysia Penna ? ? ?Encounter Date: 02/20/2022 ? ? End of Session - 02/21/22 1900   ? ? Visit Number 51   ? Number of Visits 67   ? Date for SLP Re-Evaluation 04/19/22   ? Authorization Type [employer]   ? Authorization Time Period 01/24/2022 thru 04/19/2022   ? Authorization - Visit Number 1   ? Authorization - Number of Visits 38   ? Progress Note Due on Visit 10   ? SLP Start Time 0900   ? SLP Stop Time  1000   ? SLP Time Calculation (min) 60 min   ? Activity Tolerance Patient tolerated treatment well   ? ?  ?  ? ?  ? ? ?Past Medical History:  ?Diagnosis Date  ? DM (diabetes mellitus) ([place])   ? Hyperlipidemia   ? Hypertension   ? ? ?Past Surgical History:  ?Procedure Laterality Date  ? BACK SURGERY    ? cyst removal  ? IR CT HEAD LTD  05/07/2021  ? IR PERCUTANEOUS ART THROMBECTOMY/INFUSION INTRACRANIAL INC DIAG ANGIO  05/07/2021  ? RADIOLOGY WITH ANESTHESIA N/A 05/07/2021  ? Procedure: IR WITH ANESTHESIA;  Surgeon: Radiologist, Medication, MD;  Location: [facility];  Service: Radiology;  Laterality: N/A;  ? ? ?There were no vitals filed for this visit. ? ? Subjective Assessment - 02/21/22 1859   ? ? Subjective "you got your phone"   ? Patient is accompained by: Family member   ? ?  ?  ? ?  ? ? ? ? ? ? ? ? ADULT SLP TREATMENT - 02/21/22 0001   ? ?  ? Treatment Provided  ? Treatment provided Cognitive-Linquistic   ?  ? Cognitive-Linquistic Treatment  ? Treatment focused on Aphasia;Patient/family/caregiver education   ? Skilled Treatment Skilled treatment session focused on pt's language impairments, specifically functional expressive and receptive language during contextually based conversation. SLP facilitated  session by providing the following interventions:    Comprehension of verbal statements/questions by SLP:   5 out of 10; improving to 8 out of 10 with written word level cue; improving to 10 out of 10 with phrase level written cue    Written language: Maximal assistance to spell and write his children's names; SLP facilitated by having pt copy letters of the alphabet to establish motor pattern with Mod I assistance to copy names of family members   ? ?  ?  ? ?  ? ? ? SLP Education - 02/21/22 1859   ? ? Education Details writting   ? Person(s) Educated Patient;Other (comment)   pt's sister  ? Methods Explanation;Demonstration;Verbal cues;Handout   ? Comprehension Verbalized understanding;Returned demonstration;Need further instruction   ? ?  ?  ? ?  ? ? ? SLP Short Term Goals - 02/16/22 1812   ? ?  ? SLP SHORT TERM GOAL #1  ? Title The pt will answer simple biographical and orientation yes/no questions presented auditorily at 80% accuracy given minimal multimodal cues.   ? Time 10   ? Period --   sessions  ? Status Partially Met   ?  ? SLP SHORT TERM GOAL #2  ? Title Pt will improve speech approximation of functional language at the sentence level during 50%  of conversation.   ? Time 10   ? Period --   sessions  ? Status Partially Met   ?  ? SLP SHORT TERM GOAL #3  ? Title The pt will identify the correct word given 2 choices at 80% accuracy given frequent visual cues in order to comprehend his bills.   ? Status Achieved   ?  ? SLP SHORT TERM GOAL #4  ? Title Pt will copy numbers with 80% accuracy given minimal cues.   ? Time 10   ? Period --   sessions  ? Status On-going   ?  ? SLP SHORT TERM GOAL #5  ? Title Pt will copy name of bill with 80% accuracy.   ? Time 10   ? Period --   sessions  ? Status On-going   ? ?  ?  ? ?  ? ? ? SLP Long Term Goals - 02/16/22 1815   ? ?  ? SLP LONG TERM GOAL #1  ? Title Pt will use multi-modal communication to communitcate basic wants and needs.   ? Status On-going   ? Target Date  04/19/22   ? ?  ?  ? ?  ? ? ? Plan - 02/21/22 1900   ? ? Clinical Impression Statement Pt continues with moderate Wernicke's aphasia. In addition, pt has severe deficits in spelling and written language. I recommend continued skilled ST to improve intelligibility, verbal expression and swallowing safety necessary for increased communicatory success and independence in the home and community environments.   ? Speech Therapy Frequency 2x / week   ? Duration 12 weeks   ? Treatment/Interventions Language facilitation;Cueing hierarchy;Multimodal communcation approach;Compensatory strategies;SLP instruction and feedback;Functional tasks;Patient/family education   ? Potential to Achieve Goals Good   ? SLP Home Exercise Plan provided, see pt instructions section   ? Consulted and Agree with Plan of Care Patient;Family member/caregiver   ? Family Member Consulted pt's sister   ? ?  ?  ? ?  ? ? ?Patient will benefit from skilled therapeutic intervention in order to improve the following deficits and impairments:   ?Aphasia ? ?Apraxia ? ?Cerebrovascular accident (CVA) due to thrombosis of left middle cerebral artery ([place]) ? 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 ? ? ?Problem List ?Patient Active Problem List  ? Diagnosis Date Noted  ? 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 ([place]) 05/12/2021  ? Stroke ([place]) 05/07/2021  ? Acute ischemic left MCA stroke ([place]) 05/07/2021  ? 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 ?Karlee Staff B. Rutherford Nail, M.S., [name], CBIS ?Speech-Language Pathologist ?Rehabilitation Services ?Office [phone] ? ?[place], [place] ?02/21/2022, 7:02 PM ? ?[place] ?[facility] ?[place], [place], [address] ?Phone: [phone]   Fax:  [phone] ? ? ?Name: Peter Lucas ?MRN: [id number] ?Date of Birth: [birth year] ? ?

## 2022-02-22 ENCOUNTER — Ambulatory Visit: Payer: BC Managed Care – PPO | Admitting: Speech Pathology

## 2022-02-22 ENCOUNTER — Other Ambulatory Visit: Payer: Self-pay

## 2022-02-22 DIAGNOSIS — R482 Apraxia: Secondary | ICD-10-CM

## 2022-02-22 DIAGNOSIS — R4701 Aphasia: Secondary | ICD-10-CM

## 2022-02-22 DIAGNOSIS — I63312 Cerebral infarction due to thrombosis of left middle cerebral artery: Secondary | ICD-10-CM

## 2022-02-22 DIAGNOSIS — I639 Cerebral infarction, unspecified: Secondary | ICD-10-CM

## 2022-02-23 NOTE — Therapy (Signed)
[place] ?[facility] ?[place], [place], [address] ?Phone: [phone]   Fax:  [phone] ? ?Speech Language Pathology Treatment ? ?Patient Details  ?Name: Peter Lucas ?MRN: [id number] ?Date of Birth: 16-Jul-1966 ?Referring Provider (SLP): Alysia Penna ? ? ?Encounter Date: 02/22/2022 ? ? End of Session - 02/23/22 0946   ? ? Visit Number 51   ? Number of Visits 67   ? Date for SLP Re-Evaluation 04/19/22   ? Authorization Type [employer]   ? Authorization Time Period 01/24/2022 thru 04/19/2022   ? Authorization - Visit Number 2   ? Authorization - Number of Visits 37   ? Progress Note Due on Visit 10   ? SLP Start Time 0900   ? SLP Stop Time  1000   ? SLP Time Calculation (min) 60 min   ? Activity Tolerance Patient tolerated treatment well   ? ?  ?  ? ?  ? ? ?Past Medical History:  ?Diagnosis Date  ? DM (diabetes mellitus) ([place])   ? Hyperlipidemia   ? Hypertension   ? ? ?Past Surgical History:  ?Procedure Laterality Date  ? BACK SURGERY    ? cyst removal  ? IR CT HEAD LTD  05/07/2021  ? IR PERCUTANEOUS ART THROMBECTOMY/INFUSION INTRACRANIAL INC DIAG ANGIO  05/07/2021  ? RADIOLOGY WITH ANESTHESIA N/A 05/07/2021  ? Procedure: IR WITH ANESTHESIA;  Surgeon: Radiologist, Medication, MD;  Location: [facility];  Service: Radiology;  Laterality: N/A;  ? ? ?There were no vitals filed for this visit. ? ? Subjective Assessment - 02/23/22 0944   ? ? Subjective "wierdest thing" referring to inability to spell his children's names   ? Patient is accompained by: Family member   ? Currently in Pain? No/denies   ? ?  ?  ? ?  ? ? ? ? ? ? ? ? ADULT SLP TREATMENT - 02/23/22 0001   ? ?  ? Treatment Provided  ? Treatment provided Cognitive-Linquistic   ?  ? Cognitive-Linquistic Treatment  ? Treatment focused on Aphasia;Apraxia;Patient/family/caregiver education   ? Skilled Treatment Skilled treatment session focused on pt's language impairments, specifically functional  expressive and receptive language during contextually based conversation. SLP facilitated session by providing the following interventions:    Comprehension of verbal statements/questions by SLP:   5 of 5    Written language: letter scramble - moderate assist to physically arrange in correct order to spell his children's names - Charlies Silvers, progressed to arranging letters in correct order when writing the above names, when physical letters removed, pt unable to write names   ? ?  ?  ? ?  ? ? ? SLP Education - 02/23/22 0946   ? ? Education Details writing impairment s/p stroke   ? Person(s) Educated Patient;Other (comment)   pt's sister  ? Methods Explanation;Demonstration;Verbal cues;Handout   ? Comprehension Verbalized understanding;Returned demonstration;Need further instruction   ? ?  ?  ? ?  ? ? ? SLP Short Term Goals - 02/16/22 1812   ? ?  ? SLP SHORT TERM GOAL #1  ? Title The pt will answer simple biographical and orientation yes/no questions presented auditorily at 80% accuracy given minimal multimodal cues.   ? Time 10   ? Period --   sessions  ? Status Partially Met   ?  ? SLP SHORT TERM GOAL #2  ? Title Pt will improve speech approximation of functional language at the sentence level during  50% of conversation.   ? Time 10   ? Period --   sessions  ? Status Partially Met   ?  ? SLP SHORT TERM GOAL #3  ? Title The pt will identify the correct word given 2 choices at 80% accuracy given frequent visual cues in order to comprehend his bills.   ? Status Achieved   ?  ? SLP SHORT TERM GOAL #4  ? Title Pt will copy numbers with 80% accuracy given minimal cues.   ? Time 10   ? Period --   sessions  ? Status On-going   ?  ? SLP SHORT TERM GOAL #5  ? Title Pt will copy name of bill with 80% accuracy.   ? Time 10   ? Period --   sessions  ? Status On-going   ? ?  ?  ? ?  ? ? ? SLP Long Term Goals - 02/16/22 1815   ? ?  ? SLP LONG TERM GOAL #1  ? Title Pt will use multi-modal communication to communitcate  basic wants and needs.   ? Status On-going   ? Target Date 04/19/22   ? ?  ?  ? ?  ? ? ? Plan - 02/23/22 0947   ? ? Clinical Impression Statement Pt continues with moderate Wernicke's aphasia. In addition, pt has severe deficits in spelling and written language. This Probation officer made a video of the above activities for pt to utilize for reiteration of letters/spelling. I recommend continued skilled ST to improve intelligibility, verbal expression and swallowing safety necessary for increased communicatory success and independence in the home and community environments.   ? Speech Therapy Frequency 2x / week   ? Duration 12 weeks   ? Treatment/Interventions Language facilitation;Cueing hierarchy;Multimodal communcation approach;Compensatory strategies;SLP instruction and feedback;Functional tasks;Patient/family education   ? Potential to Achieve Goals Good   ? Potential Considerations Severity of impairments;Family/community support;Ability to learn/carryover information   ? SLP Home Exercise Plan provided, see pt instructions section   ? Consulted and Agree with Plan of Care Patient;Family member/caregiver   ? Family Member Consulted pt's sister   ? ?  ?  ? ?  ? ? ?Patient will benefit from skilled therapeutic intervention in order to improve the following deficits and impairments:   ?Aphasia ? ?Apraxia ? ?Cerebrovascular accident (CVA) due to thrombosis of left middle cerebral artery ([place]) ? 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 ? ? ?Problem List ?Patient Active Problem List  ? Diagnosis Date Noted  ? 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 ([place]) 05/12/2021  ? Stroke ([place]) 05/07/2021  ? Acute ischemic left MCA stroke ([place]) 05/07/2021  ? 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 ?Maika Mcelveen B. Rutherford Nail, M.S., [name], CBIS ?Speech-Language Pathologist ?Rehabilitation Services ?Office [phone] ? ?[place], [name] ?02/23/2022, 9:47 AM ? ?[place] ?[facility] ?[place], [place], [address] ?Phone: [phone]   Fax:  [phone] ? ? ?Name: Peter Lucas ?MRN: [id number] ?Date of Birth: 22-Dec-1965 ? ?

## 2022-02-23 NOTE — Patient Instructions (Signed)
Practice writing his children's names using letter scramble and writing - utilize video for correct letters/sounds/spelling ?

## 2022-02-27 ENCOUNTER — Ambulatory Visit: Payer: BC Managed Care – PPO | Admitting: Speech Pathology

## 2022-02-27 ENCOUNTER — Other Ambulatory Visit: Payer: Self-pay

## 2022-02-27 DIAGNOSIS — R4701 Aphasia: Secondary | ICD-10-CM

## 2022-02-27 DIAGNOSIS — I63312 Cerebral infarction due to thrombosis of left middle cerebral artery: Secondary | ICD-10-CM

## 2022-02-27 DIAGNOSIS — I639 Cerebral infarction, unspecified: Secondary | ICD-10-CM

## 2022-02-27 DIAGNOSIS — R482 Apraxia: Secondary | ICD-10-CM

## 2022-03-01 ENCOUNTER — Ambulatory Visit: Payer: BC Managed Care – PPO | Admitting: Speech Pathology

## 2022-03-01 ENCOUNTER — Other Ambulatory Visit: Payer: Self-pay

## 2022-03-01 DIAGNOSIS — R482 Apraxia: Secondary | ICD-10-CM

## 2022-03-01 DIAGNOSIS — R4701 Aphasia: Secondary | ICD-10-CM

## 2022-03-01 DIAGNOSIS — I63312 Cerebral infarction due to thrombosis of left middle cerebral artery: Secondary | ICD-10-CM

## 2022-03-01 DIAGNOSIS — I639 Cerebral infarction, unspecified: Secondary | ICD-10-CM

## 2022-03-01 NOTE — Therapy (Signed)
[facility] ?[place], [place], [address] ?Phone: [phone]   Fax:  [phone] ? ?Speech Language Pathology Treatment ? ?Patient Details  ?Name: Peter Lucas ?MRN: [id number] ?Date of Birth: 1966/07/21 ?Referring Provider (SLP): Alysia Penna ? ? ?Encounter Date: 02/27/2022 ? ? End of Session - 03/01/22 8937   ? ? Visit Number 99   ? Number of Visits 67   ? Date for SLP Re-Evaluation 04/19/22   ? Authorization Type [employer]   ? Authorization Time Period 01/24/2022 thru 04/19/2022   ? Authorization - Visit Number 3   ? Authorization - Number of Visits 36   ? Progress Note Due on Visit 10   ? SLP Start Time 0900   ? SLP Stop Time  1000   ? SLP Time Calculation (min) 60 min   ? Activity Tolerance Patient tolerated treatment well   ? ?  ?  ? ?  ? ? ?Past Medical History:  ?Diagnosis Date  ? DM (diabetes mellitus) ([place])   ? Hyperlipidemia   ? Hypertension   ? ? ?Past Surgical History:  ?Procedure Laterality Date  ? BACK SURGERY    ? cyst removal  ? IR CT HEAD LTD  05/07/2021  ? IR PERCUTANEOUS ART THROMBECTOMY/INFUSION INTRACRANIAL INC DIAG ANGIO  05/07/2021  ? RADIOLOGY WITH ANESTHESIA N/A 05/07/2021  ? Procedure: IR WITH ANESTHESIA;  Surgeon: Radiologist, Medication, MD;  Location: [facility];  Service: Radiology;  Laterality: N/A;  ? ? ?There were no vitals filed for this visit. ? ? Subjective Assessment - 02/28/22 1459   ? ? Subjective "whasup?"   ? Patient is accompained by: Family member   ? Currently in Pain? No/denies   ? ?  ?  ? ?  ? ? ? ? ? ? ? ? ADULT SLP TREATMENT - 02/28/22 0001   ? ?  ? Treatment Provided  ? Treatment provided Cognitive-Linquistic   ?  ? Cognitive-Linquistic Treatment  ? Treatment focused on Aphasia;Apraxia;Patient/family/caregiver education   ? Skilled Treatment Skilled treatment session focused on pt's language impairments, specifically functional expressive and receptive language during contextually based  conversation. SLP facilitated session by providing the following interventions:    Comprehension of verbal statements/questions by SLP:   2 of 10 with verbal only improved to 10 out of 10 with written cues; during moments of communication breakdown/listener misunderstanding, written cues were used to facilitate listener understanding - pt was surprised at the words he had spoken and had difficulty correcting to improve listener understanding   ? ?  ?  ? ?  ? ? ? SLP Education - 03/01/22 0620   ? ? Education Details communication breakdowns   ? Person(s) Educated Patient;Other (comment)   sister  ? Methods Explanation;Demonstration;Verbal cues   ? Comprehension Verbalized understanding;Returned demonstration;Need further instruction   ? ?  ?  ? ?  ? ? ? SLP Short Term Goals - 02/16/22 1812   ? ?  ? SLP SHORT TERM GOAL #1  ? Title The pt will answer simple biographical and orientation yes/no questions presented auditorily at 80% accuracy given minimal multimodal cues.   ? Time 10   ? Period --   sessions  ? Status Partially Met   ?  ? SLP SHORT TERM GOAL #2  ? Title Pt will improve speech approximation of functional language at the sentence level during 50% of conversation.   ? Time 10   ? Period --  sessions  ? Status Partially Met   ?  ? SLP SHORT TERM GOAL #3  ? Title The pt will identify the correct word given 2 choices at 80% accuracy given frequent visual cues in order to comprehend his bills.   ? Status Achieved   ?  ? SLP SHORT TERM GOAL #4  ? Title Pt will copy numbers with 80% accuracy given minimal cues.   ? Time 10   ? Period --   sessions  ? Status On-going   ?  ? SLP SHORT TERM GOAL #5  ? Title Pt will copy name of bill with 80% accuracy.   ? Time 10   ? Period --   sessions  ? Status On-going   ? ?  ?  ? ?  ? ? ? SLP Long Term Goals - 02/16/22 1815   ? ?  ? SLP LONG TERM GOAL #1  ? Title Pt will use multi-modal communication to communitcate basic wants and needs.   ? Status On-going   ? Target Date  04/19/22   ? ?  ?  ? ?  ? ? ? Plan - 03/01/22 4098   ? ? 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 As a result, listeners have difficulty understanding his verbal communication and pt with limited means to use any compensatory word finding strategies d/t severity of aphasia. I recommend continued skilled ST to improve intelligibility, verbal expression and swallowing safety necessary for increased communicatory success and independence in the home and community environments.   ? Speech Therapy Frequency 2x / week   ? Duration 12 weeks   ? Treatment/Interventions Language facilitation;Cueing hierarchy;Multimodal communcation approach;Compensatory strategies;SLP instruction and feedback;Functional tasks;Patient/family education   ? Potential to Achieve Goals Good   ? Consulted and Agree with Plan of Care Patient;Family member/caregiver   ? Family Member Consulted pt's sister   ? ?  ?  ? ?  ? ? ?Patient will benefit from skilled therapeutic intervention in order to improve the following deficits and impairments:   ?Aphasia ? ?Apraxia ? ?Cerebrovascular accident (CVA) due to thrombosis of left middle cerebral artery ([place]) ? 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 ? ? ?Problem List ?Patient Active Problem List  ? Diagnosis Date Noted  ? 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 Stroke ([place]) 05/07/2021  ? Acute ischemic left MCA stroke ([place]) 05/07/2021  ? 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 ?Halford Goetzke B. Rutherford Nail, M.S., [name], CBIS ?Speech-Language Pathologist ?Rehabilitation Services ?Office [phone] ? ?[place] ?03/01/2022, 6:23 AM ? ?[facility] ?[place], [place], [address] ?Phone: [phone]   Fax:  [phone] ? ? ?Name: Peter Lucas ?MRN: [id number] ?Date of Birth: 1966-04-17 ? ?

## 2022-03-02 NOTE — Patient Instructions (Signed)
Complete provided writing homework ?

## 2022-03-02 NOTE — Therapy (Signed)
[facility] ?[facility] ?[place], [place], [address] ?Phone: [phone]   Fax:  [phone] ? ?Speech Language Pathology Treatment ? ?Patient Details  ?Name: Peter Lucas ?MRN: [id number] ?Date of Birth: 09-18-1966 ?Referring Provider (SLP): Alysia Penna ? ? ?Encounter Date: 03/01/2022 ? ? End of Session - 03/02/22 2209   ? ? Visit Number 57   ? Number of Visits 67   ? Date for SLP Re-Evaluation 04/19/22   ? Authorization Type [employer]   ? Authorization Time Period 01/24/2022 thru 04/19/2022   ? Authorization - Visit Number 4   ? Authorization - Number of Visits 35   ? Progress Note Due on Visit 10   ? SLP Start Time 0900   ? SLP Stop Time  1000   ? SLP Time Calculation (min) 60 min   ? Activity Tolerance Patient tolerated treatment well   ? ?  ?  ? ?  ? ? ?Past Medical History:  ?Diagnosis Date  ? DM (diabetes mellitus) ([place])   ? Hyperlipidemia   ? Hypertension   ? ? ?Past Surgical History:  ?Procedure Laterality Date  ? BACK SURGERY    ? cyst removal  ? IR CT HEAD LTD  05/07/2021  ? IR PERCUTANEOUS ART THROMBECTOMY/INFUSION INTRACRANIAL INC DIAG ANGIO  05/07/2021  ? RADIOLOGY WITH ANESTHESIA N/A 05/07/2021  ? Procedure: IR WITH ANESTHESIA;  Surgeon: Radiologist, Medication, MD;  Location: [facility];  Service: Radiology;  Laterality: N/A;  ? ? ?There were no vitals filed for this visit. ? ? Subjective Assessment - 03/02/22 2200   ? ? Subjective improved self-awareness and self-correcting of verbal errors   ? Patient is accompained by: Family member   ? Currently in Pain? No/denies   ? ?  ?  ? ?  ? ? ? ? ? ? ? ? ADULT SLP TREATMENT - 03/02/22 0001   ? ?  ? Treatment Provided  ? Treatment provided Cognitive-Linquistic   ?  ? Cognitive-Linquistic Treatment  ? Treatment focused on Aphasia;Apraxia;Patient/family/caregiver education   ? Skilled Treatment Skilled treatment session focused on pt's language impairments, specifically functional expressive and  receptive language during contextually based conversation. SLP facilitated session by providing the following interventions:    Comprehension of verbal statements/questions by SLP:   3 of 4 ; Written language: pt was supervision when unscrambling lettersto write his children's names; pt progressed to filling in misisng letters wihtin name with moderate faded to minimal assistance   ? ?  ?  ? ?  ? ? ? SLP Education - 03/02/22 2208   ? ? Education Details progress observed in session   ? Person(s) Educated Patient;Other (comment)   pt's sister  ? Methods Explanation;Demonstration;Verbal cues;Handout   ? Comprehension Verbalized understanding;Returned demonstration;Need further instruction   ? ?  ?  ? ?  ? ? ? SLP Short Term Goals - 02/16/22 1812   ? ?  ? SLP SHORT TERM GOAL #1  ? Title The pt will answer simple biographical and orientation yes/no questions presented auditorily at 80% accuracy given minimal multimodal cues.   ? Time 10   ? Period --   sessions  ? Status Partially Met   ?  ? SLP SHORT TERM GOAL #2  ? Title Pt will improve speech approximation of functional language at the sentence level during 50% of conversation.   ? Time 10   ? Period --   sessions  ? Status Partially Met   ?  ?  SLP SHORT TERM GOAL #3  ? Title The pt will identify the correct word given 2 choices at 80% accuracy given frequent visual cues in order to comprehend his bills.   ? Status Achieved   ?  ? SLP SHORT TERM GOAL #4  ? Title Pt will copy numbers with 80% accuracy given minimal cues.   ? Time 10   ? Period --   sessions  ? Status On-going   ?  ? SLP SHORT TERM GOAL #5  ? Title Pt will copy name of bill with 80% accuracy.   ? Time 10   ? Period --   sessions  ? Status On-going   ? ?  ?  ? ?  ? ? ? SLP Long Term Goals - 02/16/22 1815   ? ?  ? SLP LONG TERM GOAL #1  ? Title Pt will use multi-modal communication to communitcate basic wants and needs.   ? Status On-going   ? Target Date 04/19/22   ? ?  ?  ? ?  ? ? ? Plan - 03/02/22  2209   ? ? 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 As a result, listeners have difficulty understanding his verbal communication and pt with limited means to use any compensatory word finding strategies d/t severity of aphasia. I recommend continued skilled ST to improve intelligibility, verbal expression and swallowing safety necessary for increased communicatory success and independence in the home and community environments.   ? Speech Therapy Frequency 2x / week   ? Duration 12 weeks   ? Treatment/Interventions Language facilitation;Cueing hierarchy;Multimodal communcation approach;Compensatory strategies;SLP instruction and feedback;Functional tasks;Patient/family education   ? Potential to Achieve Goals Good   ? Potential Considerations Severity of impairments;Family/community support;Ability to learn/carryover information   ? SLP Home Exercise Plan provided, see pt instructions section   ? Consulted and Agree with Plan of Care Patient;Family member/caregiver   ? Family Member Consulted pt's sister   ? ?  ?  ? ?  ? ? ?Patient will benefit from skilled therapeutic intervention in order to improve the following deficits and impairments:   ?Aphasia ? ?Apraxia ? ?Cerebrovascular accident (CVA) due to thrombosis of left middle cerebral artery ([place]) ? 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 ? ? ?Problem List ?Patient Active Problem List  ? Diagnosis Date Noted  ? 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 Stroke ([place]) 05/07/2021  ? Acute ischemic left MCA stroke ([place]) 05/07/2021  ? 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 ? ?Sally-Ann Cutbirth B. Rutherford Nail, M.S., [name], CBIS ?Speech-Language Pathologist ?Rehabilitation Services ?Office [phone] ? ?[place], [name] ?03/02/2022, 10:10 PM ? ?[facility] ?[place], [place],  [address] ?Phone: [phone]   Fax:  [phone] ? ? ?Name: Peter Lucas ?MRN: [id number] ?Date of Birth: 21-May-1966 ? ?

## 2022-03-06 ENCOUNTER — Ambulatory Visit: Payer: BC Managed Care – PPO | Attending: Physical Medicine & Rehabilitation | Admitting: Speech Pathology

## 2022-03-06 DIAGNOSIS — I63512 Cerebral infarction due to unspecified occlusion or stenosis of left middle cerebral artery: Secondary | ICD-10-CM | POA: Insufficient documentation

## 2022-03-06 DIAGNOSIS — I639 Cerebral infarction, unspecified: Secondary | ICD-10-CM

## 2022-03-06 DIAGNOSIS — R482 Apraxia: Secondary | ICD-10-CM | POA: Diagnosis present

## 2022-03-06 DIAGNOSIS — I63312 Cerebral infarction due to thrombosis of left middle cerebral artery: Secondary | ICD-10-CM

## 2022-03-06 DIAGNOSIS — R4701 Aphasia: Secondary | ICD-10-CM | POA: Diagnosis present

## 2022-03-06 DIAGNOSIS — R41841 Cognitive communication deficit: Secondary | ICD-10-CM | POA: Insufficient documentation

## 2022-03-06 NOTE — Patient Instructions (Signed)
Complete writing exercises ?

## 2022-03-06 NOTE — Therapy (Signed)
[facility] ?[facility] ?[place], [place], [address] ?Phone: [phone]   Fax:  [phone] ? ?Speech Language Pathology Treatment ? ?Patient Details  ?Name: Peter Lucas ?MRN: [id number] ?Date of Birth: Jul 09, 1966 ?Referring Provider (SLP): Alysia Penna ? ? ?Encounter Date: 03/06/2022 ? ? End of Session - 03/06/22 1614   ? ? Visit Number 59   ? Number of Visits 67   ? Date for SLP Re-Evaluation 04/19/22   ? Authorization Type [employer]   ? Authorization Time Period 01/24/2022 thru 04/19/2022   ? Authorization - Visit Number 5   ? Authorization - Number of Visits 34   ? Progress Note Due on Visit 10   ? SLP Start Time 0900   ? SLP Stop Time  1000   ? SLP Time Calculation (min) 60 min   ? Activity Tolerance Patient tolerated treatment well   ? ?  ?  ? ?  ? ? ?Past Medical History:  ?Diagnosis Date  ? DM (diabetes mellitus) ([place])   ? Hyperlipidemia   ? Hypertension   ? ? ?Past Surgical History:  ?Procedure Laterality Date  ? BACK SURGERY    ? cyst removal  ? IR CT HEAD LTD  05/07/2021  ? IR PERCUTANEOUS ART THROMBECTOMY/INFUSION INTRACRANIAL INC DIAG ANGIO  05/07/2021  ? RADIOLOGY WITH ANESTHESIA N/A 05/07/2021  ? Procedure: IR WITH ANESTHESIA;  Surgeon: Radiologist, Medication, MD;  Location: [facility];  Service: Radiology;  Laterality: N/A;  ? ? ?There were no vitals filed for this visit. ? ? Subjective Assessment - 03/06/22 1613   ? ? Subjective improved self-awareness and self-correcting of verbal errors   ? Patient is accompained by: Family member   ? Currently in Pain? No/denies   ? ?  ?  ? ?  ? ? ? ? ? ? ? ? ADULT SLP TREATMENT - 03/06/22 0001   ? ?  ? Treatment Provided  ? Treatment provided Cognitive-Linquistic   ?  ? Cognitive-Linquistic Treatment  ? Treatment focused on Aphasia;Apraxia;Patient/family/caregiver education   ? Skilled Treatment Skilled treatment session focused on pt's language impairments, specifically functional expressive and  receptive language during contextually based conversation.  ? ?SLP facilitated session by providing the following interventions:     ? ?Comprehension of verbal statements/questions by SLP:   Much improved, pt didn't require any written information but heavy verbal and gestural cues     ? ?Written language: SLP provided maximal assistance for writing his children's names when provided with first letter only, specifically pairing letters with their sound within the names - pt had immense difficulty as he continued saying the name instead of imitating the sound/letter itself   ? ?  ?  ? ?  ? ? ? SLP Education - 03/06/22 1614   ? ? Education Details sounding out words   ? Person(s) Educated Patient;Other (comment)   pt's sister  ? Methods Explanation;Demonstration;Verbal cues;Handout   ? Comprehension Need further instruction   ? ?  ?  ? ?  ? ? ? SLP Short Term Goals - 02/16/22 1812   ? ?  ? SLP SHORT TERM GOAL #1  ? Title The pt will answer simple biographical and orientation yes/no questions presented auditorily at 80% accuracy given minimal multimodal cues.   ? Time 10   ? Period --   sessions  ? Status Partially Met   ?  ? SLP SHORT TERM GOAL #2  ? Title Pt will  improve speech approximation of functional language at the sentence level during 50% of conversation.   ? Time 10   ? Period --   sessions  ? Status Partially Met   ?  ? SLP SHORT TERM GOAL #3  ? Title The pt will identify the correct word given 2 choices at 80% accuracy given frequent visual cues in order to comprehend his bills.   ? Status Achieved   ?  ? SLP SHORT TERM GOAL #4  ? Title Pt will copy numbers with 80% accuracy given minimal cues.   ? Time 10   ? Period --   sessions  ? Status On-going   ?  ? SLP SHORT TERM GOAL #5  ? Title Pt will copy name of bill with 80% accuracy.   ? Time 10   ? Period --   sessions  ? Status On-going   ? ?  ?  ? ?  ? ? ? SLP Long Term Goals - 02/16/22 1815   ? ?  ? SLP LONG TERM GOAL #1  ? Title Pt will use multi-modal  communication to communitcate basic wants and needs.   ? Status On-going   ? Target Date 04/19/22   ? ?  ?  ? ?  ? ? ? Plan - 03/06/22 1615   ? ? 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 In addition, pt has severe deficits in spelling and written language.  I recommend continued skilled ST to improve intelligibility, verbal expression and swallowing safety necessary for increased communicatory success and independence in the home and community environments.   ? Speech Therapy Frequency 2x / week   ? Duration 12 weeks   ? Treatment/Interventions Language facilitation;Cueing hierarchy;Multimodal communcation approach;Compensatory strategies;SLP instruction and feedback;Functional tasks;Patient/family education   ? Potential to Achieve Goals Good   ? SLP Home Exercise Plan provided, see pt instructions section   ? Consulted and Agree with Plan of Care Patient;Family member/caregiver   ? Family Member Consulted pt's sister   ? ?  ?  ? ?  ? ? ?Patient will benefit from skilled therapeutic intervention in order to improve the following deficits and impairments:   ?Aphasia ? ?Apraxia ? ?Cerebrovascular accident (CVA) due to thrombosis of left middle cerebral artery ([place]) ? 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 ? ? ?Problem List ?Patient Active Problem List  ? Diagnosis Date Noted  ? 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 Stroke ([place]) 05/07/2021  ? Acute ischemic left MCA stroke ([place]) 05/07/2021  ? 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 ?Elani Delph B. Rutherford Nail, M.S., [name], CBIS ?Speech-Language Pathologist ?Rehabilitation Services ?Office [phone] ? ? ?Anajah Sterbenz, [name] ?03/06/2022, 4:16 PM ? ? ?[facility] ?[place], [place], [address] ?Phone: [phone]   Fax:  [phone] ? ? ?Name: NASIM HABEEB ?MRN:  [id number] ?Date of Birth: [birth year] ? ?

## 2022-03-08 ENCOUNTER — Ambulatory Visit: Payer: BC Managed Care – PPO | Admitting: Speech Pathology

## 2022-03-08 ENCOUNTER — Encounter: Payer: Self-pay | Admitting: Adult Health

## 2022-03-08 ENCOUNTER — Ambulatory Visit (INDEPENDENT_AMBULATORY_CARE_PROVIDER_SITE_OTHER): Payer: BC Managed Care – PPO | Admitting: Adult Health

## 2022-03-08 VITALS — BP 163/97 | HR 72 | Ht 72.0 in | Wt 288.0 lb

## 2022-03-08 DIAGNOSIS — R482 Apraxia: Secondary | ICD-10-CM

## 2022-03-08 DIAGNOSIS — I639 Cerebral infarction, unspecified: Secondary | ICD-10-CM

## 2022-03-08 DIAGNOSIS — I69320 Aphasia following cerebral infarction: Secondary | ICD-10-CM

## 2022-03-08 DIAGNOSIS — R4701 Aphasia: Secondary | ICD-10-CM

## 2022-03-08 DIAGNOSIS — I63312 Cerebral infarction due to thrombosis of left middle cerebral artery: Secondary | ICD-10-CM

## 2022-03-08 NOTE — Patient Instructions (Signed)
Continue working with speech therapy for hopeful ongoing recovery ? ?Continue aspirin 81 mg daily  and atorvastatin for secondary stroke prevention ? ?Continue to follow up with PCP regarding cholesterol, blood pressure and diabetes management  ?Maintain strict control of hypertension with blood pressure goal below 130/90, diabetes with hemoglobin A1c goal below 7.0 % and cholesterol with LDL cholesterol (bad cholesterol) goal below 70 mg/dL.  ? ?Signs of a Stroke? Follow the BEFAST method:  ?Balance Watch for a sudden loss of balance, trouble with coordination or vertigo ?Eyes Is there a sudden loss of vision in one or both eyes? Or double vision?  ?Face: Ask the person to smile. Does one side of the face droop or is it numb?  ?Arms: Ask the person to raise both arms. Does one arm drift downward? Is there weakness or numbness of a leg? ?Speech: Ask the person to repeat a simple phrase. Does the speech sound slurred/strange? Is the person confused ? ?Time: If you observe any of these signs, call 911. ? ? ? ? ? ? ?Thank you for coming to see [place] at [facility]. I hope we have been able to provide you high quality care today. ? ?You may receive a patient satisfaction survey over the next few weeks. We would appreciate your feedback and comments so that we may continue to improve ourselves and the health of our patients ?

## 2022-03-08 NOTE — Progress Notes (Signed)
?[facility] ?[address] ?[place]. [place] [address] ?[phone] ? ?     STROKE FOLLOW UP NOTE ? ?[name] ?Date of Birth:  25-Dec-1965 ?Medical Record Number:  [id number]  ? ?Reason for Referral: stroke follow up ? ? ? ?SUBJECTIVE: ? ? ?CHIEF COMPLAINT:  ?Chief Complaint  ?Patient presents with  ? Follow-up  ?  Rm 3 with daughter Peter Lucas ?Pt is well, daughter states he has been doing well. Still working on comprehension and speech but overall stable   ? ? ? ?HPI:  ? ?Update 03/08/2022 JM: Patient returns for stroke follow-up after prior visit 5 months ago.  Stable from stroke standpoint without new stroke/TIA symptoms.  Continues to work with SLP for residual aphasia which has been gradually improving since prior visit. Has since returned back to driving without difficulty, will avoid night time driving and driving in bad weather. Compliant on aspirin and atorvastatin, denies side effects.  Blood pressure today elevated, does not monitor at home.  A1c 12.9 (2/14) with adjustments made to diabetic regimen by PCP - reports improvement of levels since that time and plans on repeat A1c next month. Was started on metformin and experienced bil blurred vision shortly after - seen at ED 2/28, work up largely unremarkable including MRI and felt likely due to metformin, rec'd taking metformin twice daily instead of once daily and vision returned back to baseline after a few days. He has not had any recurrence or new vision issues.  He did complete 30-day cardiac monitor without evidence of atrial fibrillation.  Follows with pulmonology for sleep apnea management on CPAP but having difficulty tolerating - plans to having pressure setting changed soon in hopes of improved tolerance.  Continued tobacco cessation.  No further concerns at this time. ? ? ? ? ? ?History provided for reference purposes only ?Update 10/20/2022: Patient returns for stroke follow up accompanied by daughter Peter Lucas   ? ?Has  been doing well since prior visit  ?Continues working with SLP for residual expressive and receptive language deficit.  He has been making great improvement since prior visit. He does still have some difficulty understanding what is being said to him if too long of a sentence or if spoke to too quickly but able to understand/read written language without difficulty.  Moderate aphasia persists although improved since prior visit.  No cognitive concerns.  He is currently living alone but does have very supportive family checking on him frequently.  Able to maintain ADLs and majority of IADLs including doing home medications - daughter does assist with bill paying.  Daughter does report occasional irritability.  Denies residual functional motor deficits.  Completed PT/OT back in September.  He does question return to driving ability. Spoke to his current speech therapist regarding driving (see telephone note from 10/19/2021). Per daughter, he has been driving some but always accompanied by a family member - daughter has no concerns regarding his driving. ? ?Continuing his Aspirin and Atorvastatin without side effects.  Blood pressure today elevated with similar reading on recheck, asymptomatic- not routinely monitored at home but typically stable provider appointments.  Remains on Holter monitor until 12/1 for total of 30 days.  Completed 72-hour cardiac monitor which was negative for A. fib.   ? ?No further concerns at this time ? ?Update 07/12/2021, Peter Lucas is being seen for hospital follow-up accompanied by his son, Peter Lucas.  Reports residual language difficulties - word finding or saying inappropriate word and mild comprehension difficulty-  does report improvement daily. No residual physical deficits. Does ADLs and majority of IADLs independently although family continues to provide 24-hour supervision as instructed at discharge. Currently working with OT/SLP at [facility] and plans on transitioning to outpatient  therapies once completed. Denies new stroke/TIA symptoms.  Compliant on aspirin, Plavix and atorvastatin -mild bruising but no other side effects.  Blood pressure today 147/99. Not routinely monitored at home.  Routinely monitors glucose levels which have been stable.  Reports complete tobacco cessation with use of nicotine patches.  No further concerns at this time. ? ?Stroke admission 05/07/2021 ?Peter Lucas is a 56 year old male w/pmh of smoking who presented on 05/07/2021 with dense aphasia. Personally reviewed hospitalization from progress notes, lab work and imaging summary provided.  Evaluated by Dr. 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  EF 55 to 60%.  LDL 86.  A1c 10.1 - no prior DM hx.  Recommended DAPT for 3 months then aspirin alone due to reocclusion during thrombectomy as well as atorvastatin 40 mg daily.  Close monitoring of BP initially on Cleviprex - resumed home meds at discharge.  Other stroke risk factors include tobacco use, obesity and suspected sleep apnea.  No prior stroke history.  Residual aphasia, right hemiparesis and decreased functional mobility.  Discharged to [facility] on 05/12/2021 - 05/26/2021.  ? ? ? ? ? ?Diagnostic imaging ? ?05/07/21 CT Head  ?1. Posterior left MCA infarct with fairly well-developed cytotoxic ?edema. ASPECTS 7. ?2. No hemorrhagic transformation or mass effect. Suspicion of a ?posterior left MCA branch occlusion at the posterior sylvian ?fissure. ?  ?05/07/21 CTA Head and Neck  ?1. Positive for occlusion of a posterior Left MCA branch, distal M2 ?versus proximal M3. ?Associated posterior left MCA territory infarct core of 42 mL and 70 ?mL estimated penumbra by CTP (mismatch of 28 mL). ?2. Minimal atherosclerosis in the neck but positive for intracranial ?atherosclerosis including: ?- severe stenosis at  the Right Vertebrobasilar junction. ?- moderate to severe stenoses of the Right PCA P1/P2 junction. ?- moderate to severe stenosis of the Left Pericallosal Artery (ACA ?territory). ?  ?05/09/21 MRI Brain WO Contrast  ?1. Large, confluent acute posterior left MCA territory infarct with ?smaller acute infarcts scattered throughout the left frontotemporal ?lobes and left insular region. Associated edema and regional mass ?effect without midline shift. Areas of petechial hemorrhage along ?the posteromedial aspect of the confluent hemorrhage. ?2. Curvilinear susceptibility artifact within the posterior aspect ?of the left sylvian fissure, extending into the region of confluent ?infarct, most likely representing distal left MCA thrombus that was ?better characterized on prior vascular studies. ?3. Paranasal sinus mucosal thickening and bilateral mastoid ?effusions. ?  ?05/08/21 Echo Complete w/Bubble  ?1. Left ventricular ejection fraction, by estimation, is 55 to 60%. The left ventricle has normal function. The left ventricle has no regional wall motion abnormalities. There is moderate left ventricular hypertrophy.  ?Left ventricular diastolic parameters are indeterminate.  ? 2. Right ventricular systolic function is normal. The right ventricular size is normal. Tricuspid regurgitation signal is inadequate for assessing PA pressure.  ? 3. The mitral valve is grossly normal. Trivial mitral valve regurgitation.  ? 4. The aortic valve is tricuspid. Aortic valve regurgitation is not visualized.  ? 5. The inferior vena cava is normal in size with greater than 50% respiratory variability, suggesting right atrial  pressure of 3 mmHg.  ? 6. No visualization of agitated saline bubbles in right heart with reported injection from IV access both upper extremities, possibly inadequate agitation prior to injection. Consider repeat study if clinically indicated.  ? ? ? ?ROS:   ?14 system review of systems performed and negative with  exception of those listed in HPI ? ?PMH:  ?Past Medical History:  ?Diagnosis Date  ? DM (diabetes mellitus) ([place])   ? Hyperlipidemia   ? Hypertension   ? ? ?PSH:  ?Past Surgical History:  ?Procedure Laterality Date  ?

## 2022-03-09 NOTE — Patient Instructions (Signed)
Continue practicing writing exercises ?

## 2022-03-09 NOTE — Therapy (Signed)
[facility] ?[place], [place], [address] ?Phone: [phone]   Fax:  [phone] ? ?Speech Language Pathology Treatment ? ?Patient Details  ?Name: Peter Lucas ?MRN: [id number] ?Date of Birth: Jul 22, 1966 ?Referring Provider (SLP): Alysia Penna ? ? ?Encounter Date: 03/08/2022 ? ? End of Session - 03/09/22 1352   ? ? Visit Number 31   ? Number of Visits 67   ? Date for SLP Re-Evaluation 04/19/22   ? Authorization Type [employer]   ? Authorization Time Period 01/24/2022 thru 04/19/2022   ? Authorization - Visit Number 6   ? Authorization - Number of Visits 33   ? Progress Note Due on Visit 10   ? SLP Start Time 0900   ? SLP Stop Time  1000   ? SLP Time Calculation (min) 60 min   ? Activity Tolerance Patient tolerated treatment well   ? ?  ?  ? ?  ? ? ?Past Medical History:  ?Diagnosis Date  ? DM (diabetes mellitus) ([place])   ? Hyperlipidemia   ? Hypertension   ? ? ?Past Surgical History:  ?Procedure Laterality Date  ? BACK SURGERY    ? cyst removal  ? IR CT HEAD LTD  05/07/2021  ? IR PERCUTANEOUS ART THROMBECTOMY/INFUSION INTRACRANIAL INC DIAG ANGIO  05/07/2021  ? RADIOLOGY WITH ANESTHESIA N/A 05/07/2021  ? Procedure: IR WITH ANESTHESIA;  Surgeon: Radiologist, Medication, MD;  Location: [facility];  Service: Radiology;  Laterality: N/A;  ? ? ?There were no vitals filed for this visit. ? ? Subjective Assessment - 03/09/22 1350   ? ? Subjective pt frustrated with difficulty of writing   ? Patient is accompained by: Family member   ? Currently in Pain? No/denies   ? ?  ?  ? ?  ? ? ? ? ? ? ? ? ADULT SLP TREATMENT - 03/09/22 0001   ? ?  ? Treatment Provided  ? Treatment provided Cognitive-Linquistic   ?  ? Cognitive-Linquistic Treatment  ? Treatment focused on Aphasia;Apraxia;Patient/family/caregiver education   ? Skilled Treatment Skilled treatment session focused on pt's language impairments, specifically functional expressive and receptive language  during contextually based conversation.  ? ?SLP facilitated session by providing the following interventions:     ? ?Comprehension of verbal statements/questions by SLP:   Much improved, pt didn't require any written information but heavy verbal and gestural cues     ? ?Written language: SLP provided maximal assistance for writing his children's names when provided with first letter only - despite maximal cues, pt with difficulty filling in misisng letters within his name   ? ?Unaware of semantic paraphasias during task, when pt produces that target correctly he is able to write target correctly   ? ?  ?  ? ?  ? ? ? SLP Education - 03/09/22 1351   ? ? Education Details difficulty within Wernicke's aphasia with assigning sounds with letters   ? Person(s) Educated Patient;Other (comment)   pt's sister  ? Methods Explanation;Demonstration;Verbal cues;Handout   ? Comprehension Need further instruction   ? ?  ?  ? ?  ? ? ? SLP Short Term Goals - 02/16/22 1812   ? ?  ? SLP SHORT TERM GOAL #1  ? Title The pt will answer simple biographical and orientation yes/no questions presented auditorily at 80% accuracy given minimal multimodal cues.   ? Time 10   ? Period --   sessions  ? Status Partially Met   ?  ?  SLP SHORT TERM GOAL #2  ? Title Pt will improve speech approximation of functional language at the sentence level during 50% of conversation.   ? Time 10   ? Period --   sessions  ? Status Partially Met   ?  ? SLP SHORT TERM GOAL #3  ? Title The pt will identify the correct word given 2 choices at 80% accuracy given frequent visual cues in order to comprehend his bills.   ? Status Achieved   ?  ? SLP SHORT TERM GOAL #4  ? Title Pt will copy numbers with 80% accuracy given minimal cues.   ? Time 10   ? Period --   sessions  ? Status On-going   ?  ? SLP SHORT TERM GOAL #5  ? Title Pt will copy name of bill with 80% accuracy.   ? Time 10   ? Period --   sessions  ? Status On-going   ? ?  ?  ? ?  ? ? ? SLP Long Term Goals -  02/16/22 1815   ? ?  ? SLP LONG TERM GOAL #1  ? Title Pt will use multi-modal communication to communitcate basic wants and needs.   ? Status On-going   ? Target Date 04/19/22   ? ?  ?  ? ?  ? ? ? Plan - 03/09/22 1352   ? ? 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 In addition, pt has severe deficits in spelling and written language and is unable to spell or write his name.  I recommend continued skilled ST to improve intelligibility, verbal expression and swallowing safety necessary for increased communicatory success and independence in the home and community environments.   ? Speech Therapy Frequency 2x / week   ? Duration 12 weeks   ? Treatment/Interventions Language facilitation;Cueing hierarchy;Multimodal communcation approach;Compensatory strategies;SLP instruction and feedback;Functional tasks;Patient/family education   ? Potential to Achieve Goals Good   ? Potential Considerations Severity of impairments;Family/community support;Ability to learn/carryover information   ? SLP Home Exercise Plan provided, see pt instructions section   ? Consulted and Agree with Plan of Care Patient;Family member/caregiver   ? Family Member Consulted pt's sister   ? ?  ?  ? ?  ? ? ?Patient will benefit from skilled therapeutic intervention in order to improve the following deficits and impairments:   ?Aphasia ? ?Apraxia ? ?Cerebrovascular accident (CVA) due to thrombosis of left middle cerebral artery ([place]) ? 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 ? ? ?Problem List ?Patient Active Problem List  ? Diagnosis Date Noted  ? 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 Stroke ([place]) 05/07/2021  ? Acute ischemic left MCA stroke ([place]) 05/07/2021  ? 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 ?Nefi Musich B. Rutherford Nail, M.S., [name], CBIS ?Speech-Language Pathologist ?Rehabilitation Services ?Office [phone] ? ?Lakia Gritton, [name] ?03/09/2022, 1:54 PM ? ?[facility] ?[place], [place], [address] ?Phone: [phone]   Fax:  [phone] ? ? ?Name: ANIKET PAYE ?MRN: [id number] ?Date of Birth: 1966-11-18 ? ?

## 2022-03-13 ENCOUNTER — Ambulatory Visit: Payer: BC Managed Care – PPO | Admitting: Speech Pathology

## 2022-03-13 DIAGNOSIS — I63312 Cerebral infarction due to thrombosis of left middle cerebral artery: Secondary | ICD-10-CM

## 2022-03-13 DIAGNOSIS — I639 Cerebral infarction, unspecified: Secondary | ICD-10-CM

## 2022-03-13 DIAGNOSIS — R482 Apraxia: Secondary | ICD-10-CM

## 2022-03-13 DIAGNOSIS — R4701 Aphasia: Secondary | ICD-10-CM

## 2022-03-14 NOTE — Therapy (Signed)
[facility] ?[place], [place], [address] ?Phone: [phone]   Fax:  [phone] ? ?Speech Language Pathology Treatment ? ?Patient Details  ?Name: Peter Lucas ?MRN: [id number] ?Date of Birth: 10-15-1966 ?Referring Provider (SLP): Alysia Penna ? ? ?Encounter Date: 03/13/2022 ? ? End of Session - 03/14/22 0841   ? ? Visit Number 57   ? Number of Visits 67   ? Date for SLP Re-Evaluation 04/19/22   ? Authorization Type [employer]   ? Authorization Time Period 01/24/2022 thru 04/19/2022   ? Authorization - Visit Number 7   ? Authorization - Number of Visits 32   ? Progress Note Due on Visit 10   ? SLP Start Time 0900   ? SLP Stop Time  1000   ? SLP Time Calculation (min) 60 min   ? Activity Tolerance Patient tolerated treatment well   ? ?  ?  ? ?  ? ? ?Past Medical History:  ?Diagnosis Date  ? DM (diabetes mellitus) ([place])   ? Hyperlipidemia   ? Hypertension   ? ? ?Past Surgical History:  ?Procedure Laterality Date  ? BACK SURGERY    ? cyst removal  ? IR CT HEAD LTD  05/07/2021  ? IR PERCUTANEOUS ART THROMBECTOMY/INFUSION INTRACRANIAL INC DIAG ANGIO  05/07/2021  ? RADIOLOGY WITH ANESTHESIA N/A 05/07/2021  ? Procedure: IR WITH ANESTHESIA;  Surgeon: Radiologist, Medication, MD;  Location: [facility];  Service: Radiology;  Laterality: N/A;  ? ? ?There were no vitals filed for this visit. ? ? Subjective Assessment - 03/14/22 0840   ? ? Subjective "no games this weekend" referring to rainy weekend and SLP's daughter didn't have a softball tournment   ? Patient is accompained by: Family member   ? Currently in Pain? No/denies   ? ?  ?  ? ?  ? ? ? ? ? ? ? ? ADULT SLP TREATMENT - 03/14/22 0001   ? ?  ? Treatment Provided  ? Treatment provided Cognitive-Linquistic   ?  ? Cognitive-Linquistic Treatment  ? Treatment focused on Aphasia;Apraxia;Patient/family/caregiver education   ? Skilled Treatment Skilled treatment session focused on pt's language  impairments, specifically functional expressive and receptive language during contextually based conversation.  ? ?SLP facilitated session by providing the following interventions:     ? ?Comprehension of verbal statements/questions by SLP:   Much improved, pt didn't require any written information but heavy verbal and gestural cues     ? ?Receptive Language/Written language at the single letter: given printed lower case alphabet board, pt require Maximal multimodal cues to select letters in his children's names given verbal letter/sound - he required maximal assistance to condition to this novel task of imitating letter vs incorrectly sounding out the syllables within each name     ? ?Letter identification- Continual cues to look at SLP for articulatory cues   ? ?  ?  ? ?  ? ? ? SLP Education - 03/14/22 0841   ? ? Education Details letter identification, spelling   ? Person(s) Educated Patient;Other (comment)   pt's sister  ? Methods Explanation;Demonstration;Tactile cues;Verbal cues;Handout   ? Comprehension Verbalized understanding;Need further instruction   ? ?  ?  ? ?  ? ? ? SLP Short Term Goals - 02/16/22 1812   ? ?  ? SLP SHORT TERM GOAL #1  ? Title The pt will answer simple biographical and orientation yes/no questions presented auditorily at 80% accuracy given minimal  multimodal cues.   ? Time 10   ? Period --   sessions  ? Status Partially Met   ?  ? SLP SHORT TERM GOAL #2  ? Title Pt will improve speech approximation of functional language at the sentence level during 50% of conversation.   ? Time 10   ? Period --   sessions  ? Status Partially Met   ?  ? SLP SHORT TERM GOAL #3  ? Title The pt will identify the correct word given 2 choices at 80% accuracy given frequent visual cues in order to comprehend his bills.   ? Status Achieved   ?  ? SLP SHORT TERM GOAL #4  ? Title Pt will copy numbers with 80% accuracy given minimal cues.   ? Time 10   ? Period --   sessions  ? Status On-going   ?  ? SLP SHORT  TERM GOAL #5  ? Title Pt will copy name of bill with 80% accuracy.   ? Time 10   ? Period --   sessions  ? Status On-going   ? ?  ?  ? ?  ? ? ? SLP Long Term Goals - 02/16/22 1815   ? ?  ? SLP LONG TERM GOAL #1  ? Title Pt will use multi-modal communication to communitcate basic wants and needs.   ? Status On-going   ? Target Date 04/19/22   ? ?  ?  ? ?  ? ? ? Plan - 03/14/22 0842   ? ? 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 In addition, pt has severe deficits in spelling and written language and is unable to spell or write his name.  I recommend continued skilled ST to improve intelligibility, verbal expression and swallowing safety necessary for increased communicatory success and independence in the home and community environments.   ? Speech Therapy Frequency 2x / week   ? Duration 12 weeks   ? Treatment/Interventions Language facilitation;Cueing hierarchy;Multimodal communcation approach;Compensatory strategies;SLP instruction and feedback;Functional tasks;Patient/family education   ? Potential to Achieve Goals Good   ? Consulted and Agree with Plan of Care Patient;Family member/caregiver   ? Family Member Consulted pt's sister   ? ?  ?  ? ?  ? ? ?Patient will benefit from skilled therapeutic intervention in order to improve the following deficits and impairments:   ?Aphasia ? ?Apraxia ? ?Cerebrovascular accident (CVA) due to thrombosis of left middle cerebral artery ([place]) ? 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 ? ? ?Problem List ?Patient Active Problem List  ? Diagnosis Date Noted  ? 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 Stroke ([place]) 05/07/2021  ? Acute ischemic left MCA stroke ([place]) 05/07/2021  ? 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 ?Collyn Ribas B. Rutherford Nail, M.S., [name], CBIS ?Speech-Language Pathologist ?Rehabilitation Services ?Office [phone] ? ?[place],  [name] ?03/14/2022, 8:42 AM ? ?[facility] ?[place], [place], [address] ?Phone: [phone]   Fax:  [phone] ? ? ?Name: Peter Lucas ?MRN: [id number] ?Date of Birth: [birth year] ? ?

## 2022-03-15 ENCOUNTER — Ambulatory Visit: Payer: BC Managed Care – PPO | Admitting: Speech Pathology

## 2022-03-15 DIAGNOSIS — R4701 Aphasia: Secondary | ICD-10-CM

## 2022-03-15 DIAGNOSIS — I639 Cerebral infarction, unspecified: Secondary | ICD-10-CM

## 2022-03-15 DIAGNOSIS — I63312 Cerebral infarction due to thrombosis of left middle cerebral artery: Secondary | ICD-10-CM

## 2022-03-15 DIAGNOSIS — R482 Apraxia: Secondary | ICD-10-CM

## 2022-03-16 NOTE — Therapy (Signed)
[facility] ?[place], [place], [address] ?Phone: [phone]   Fax:  [phone] ? ?Speech Language Pathology Treatment ? ?Patient Details  ?Name: Peter Lucas ?MRN: [id number] ?Date of Birth: [birth year] ?Referring Provider (SLP): Alysia Penna ? ? ?Encounter Date: 03/15/2022 ? ? End of Session - 03/16/22 1316   ? ? Visit Number 58   ? Number of Visits 67   ? Date for SLP Re-Evaluation 04/19/22   ? Authorization Type [employer]   ? Authorization Time Period 01/24/2022 thru 04/19/2022   ? Authorization - Visit Number 8   ? Authorization - Number of Visits 31   ? Progress Note Due on Visit 10   ? SLP Start Time 0900   ? SLP Stop Time  1000   ? SLP Time Calculation (min) 60 min   ? Activity Tolerance Patient tolerated treatment well   ? ?  ?  ? ?  ? ? ?Past Medical History:  ?Diagnosis Date  ? DM (diabetes mellitus) ([place])   ? Hyperlipidemia   ? Hypertension   ? ? ?Past Surgical History:  ?Procedure Laterality Date  ? BACK SURGERY    ? cyst removal  ? IR CT HEAD LTD  05/07/2021  ? IR PERCUTANEOUS ART THROMBECTOMY/INFUSION INTRACRANIAL INC DIAG ANGIO  05/07/2021  ? RADIOLOGY WITH ANESTHESIA N/A 05/07/2021  ? Procedure: IR WITH ANESTHESIA;  Surgeon: Radiologist, Medication, MD;  Location: [facility];  Service: Radiology;  Laterality: N/A;  ? ? ?There were no vitals filed for this visit. ? ? Subjective Assessment - 03/16/22 1312   ? ? Subjective "doing good"   ? Patient is accompained by: Family member   ? Currently in Pain? No/denies   ? ?  ?  ? ?  ? ? ? ? ? ? ? ? ADULT SLP TREATMENT - 03/16/22 0001   ? ?  ? Treatment Provided  ? Treatment provided Cognitive-Linquistic   ?  ? Cognitive-Linquistic Treatment  ? Treatment focused on Aphasia;Apraxia;Patient/family/caregiver education   ? Skilled Treatment Skilled treatment session focused on pt's language impairments, specifically functional expressive and receptive language during contextually based  conversation.  ? ?SLP facilitated session by providing the following interventions:     ? ?Comprehension of verbal statements/questions by SLP: pt able to comprehend with heavy verbal cues      ? ?Receptive Language/Written language at the single letter: given printed lower case alphabet board, pt require minimal  cues to select letters in his children's names given verbal letter/sound -      ? ?Letter identification - minimal cues to look at SLP for articulatory cues     ? ?Word finding: continued instruction provided via multimodal means to have pt create alternative ways to help during moments of communication breakdowns such as continuing to describe/talk about the word that is eluding him   ? ?  ?  ? ?  ? ? ? SLP Education - 03/16/22 1315   ? ? Education Details wrod finding strategies to use during communication breakdowns   ? Person(s) Educated Patient;Other (comment)   pt's sister  ? Methods Explanation;Demonstration;Verbal cues   ? Comprehension Need further instruction   ? ?  ?  ? ?  ? ? ? SLP Short Term Goals - 02/16/22 1812   ? ?  ? SLP SHORT TERM GOAL #1  ? Title The pt will answer simple biographical and orientation yes/no questions presented auditorily at 80% accuracy given minimal  multimodal cues.   ? Time 10   ? Period --   sessions  ? Status Partially Met   ?  ? SLP SHORT TERM GOAL #2  ? Title Pt will improve speech approximation of functional language at the sentence level during 50% of conversation.   ? Time 10   ? Period --   sessions  ? Status Partially Met   ?  ? SLP SHORT TERM GOAL #3  ? Title The pt will identify the correct word given 2 choices at 80% accuracy given frequent visual cues in order to comprehend his bills.   ? Status Achieved   ?  ? SLP SHORT TERM GOAL #4  ? Title Pt will copy numbers with 80% accuracy given minimal cues.   ? Time 10   ? Period --   sessions  ? Status On-going   ?  ? SLP SHORT TERM GOAL #5  ? Title Pt will copy name of bill with 80% accuracy.   ? Time 10   ?  Period --   sessions  ? Status On-going   ? ?  ?  ? ?  ? ? ? SLP Long Term Goals - 02/16/22 1815   ? ?  ? SLP LONG TERM GOAL #1  ? Title Pt will use multi-modal communication to communitcate basic wants and needs.   ? Status On-going   ? Target Date 04/19/22   ? ?  ?  ? ?  ? ? ? Plan - 03/16/22 1316   ? ? 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 In addition, pt has severe deficits in spelling and written language and is unable to spell or write his name.  I recommend continued skilled ST to improve intelligibility, verbal expression and swallowing safety necessary for increased communicatory success and independence in the home and community environments.   ? Speech Therapy Frequency 2x / week   ? Duration 12 weeks   ? Treatment/Interventions Language facilitation;Cueing hierarchy;Multimodal communcation approach;Compensatory strategies;SLP instruction and feedback;Functional tasks;Patient/family education   ? Potential to Achieve Goals Good   ? Consulted and Agree with Plan of Care Patient;Family member/caregiver   ? Family Member Consulted pt's sister   ? ?  ?  ? ?  ? ? ?Patient will benefit from skilled therapeutic intervention in order to improve the following deficits and impairments:   ?Aphasia ? ?Apraxia ? ?Cerebrovascular accident (CVA) due to thrombosis of left middle cerebral artery ([place]) ? 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 ? ? ?Problem List ?Patient Active Problem List  ? Diagnosis Date Noted  ? 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 Stroke ([place]) 05/07/2021  ? Acute ischemic left MCA stroke ([place]) 05/07/2021  ? 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 ?Barnie Sopko B. Rutherford Nail, M.S., [name], CBIS ?Speech-Language Pathologist ?Rehabilitation Services ?Office [phone] ? ?Alvah Lagrow, [name] ?03/16/2022, 1:17 PM ? ?[facility] ?[place], [place], [address] ?Phone: [phone]   Fax:  [phone] ? ? ?Name: Peter Lucas ?MRN: [id number] ?Date of Birth: 03/28/1966 ? ?

## 2022-03-20 ENCOUNTER — Ambulatory Visit: Payer: BC Managed Care – PPO | Admitting: Speech Pathology

## 2022-03-20 DIAGNOSIS — R4701 Aphasia: Secondary | ICD-10-CM | POA: Diagnosis not present

## 2022-03-20 DIAGNOSIS — R41841 Cognitive communication deficit: Secondary | ICD-10-CM

## 2022-03-20 NOTE — Patient Instructions (Signed)
Check with daughter and pastor re: getting printed sermon to assist with comprehension of service during church as well as for further practice at home and may bring in to session for breakdown of reading for improve comprehension  ?

## 2022-03-20 NOTE — Therapy (Signed)
[facility] ?[facility] ?[place], [place], [address] ?Phone: [phone]   Fax:  [phone] ? ?Speech Language Pathology Treatment ? ?Patient Details  ?Name: Peter Lucas ?MRN: [id number] ?Date of Birth: 1966/10/02 ?Referring Provider (SLP): Alysia Penna ? ? ?Encounter Date: 03/20/2022 ? ? End of Session - 03/20/22 1308   ? ? Visit Number 58   ? Number of Visits 67   ? Date for SLP Re-Evaluation 04/19/22   ? Authorization Type [employer]   ? Authorization Time Period 01/24/2022 thru 04/19/2022   ? Authorization - Visit Number 9   ? Authorization - Number of Visits 31   ? Progress Note Due on Visit 10   ? SLP Start Time 0900   ? SLP Stop Time  1000   ? SLP Time Calculation (min) 60 min   ? Activity Tolerance Patient tolerated treatment well   ? ?  ?  ? ?  ? ? ?Past Medical History:  ?Diagnosis Date  ? DM (diabetes mellitus) ([place])   ? Hyperlipidemia   ? Hypertension   ? ? ?Past Surgical History:  ?Procedure Laterality Date  ? BACK SURGERY    ? cyst removal  ? IR CT HEAD LTD  05/07/2021  ? IR PERCUTANEOUS ART THROMBECTOMY/INFUSION INTRACRANIAL INC DIAG ANGIO  05/07/2021  ? RADIOLOGY WITH ANESTHESIA N/A 05/07/2021  ? Procedure: IR WITH ANESTHESIA;  Surgeon: Radiologist, Medication, MD;  Location: [facility];  Service: Radiology;  Laterality: N/A;  ? ? ?There were no vitals filed for this visit. ? ? Subjective Assessment - 03/20/22 1259   ? ? Subjective "its good to see you!"   ? Patient is accompained by: Family member   ? Currently in Pain? No/denies   ? ?  ?  ? ?  ? ? ? ? ? ? ? ? ADULT SLP TREATMENT - 03/20/22 0001   ? ?  ? Treatment Provided  ? Treatment provided Cognitive-Linquistic   ?  ? Cognitive-Linquistic Treatment  ? Treatment focused on Aphasia;Apraxia;Patient/family/caregiver education   ? Skilled Treatment Skilled treatment session focused on pt's language impairments, specifically functional expressive and receptive language during contextually  based conversation. SLP facilitated session by providing the following interventions:    Comprehension of verbal statements by SLP: pt able to comprehend with mod-max verbal cues.     Comprehension of questions with improved 60% accuracy x20 IND, improved to 100% accuracy with written questions.    Word finding: continued instruction provided via multimodal means to have pt create alternative ways to help during moments of communication breakdowns such as continuing to describe/talk about the word that is eluding him which resulted in 100% accuacy of expression of x5 personal stories.   ?  ? Assessment / Recommendations / Plan  ? Plan Continue with current plan of care   ? ?  ?  ? ?  ? ? ? SLP Education - 03/20/22 1307   ? ? Education Details improvement and contiued engagement   ? Person(s) Educated Patient;Other (comment)   ? Methods Explanation;Demonstration;Verbal cues   ? Comprehension Verbalized understanding;Returned demonstration;Need further instruction   ? ?  ?  ? ?  ? ? ? SLP Short Term Goals - 02/16/22 1812   ? ?  ? SLP SHORT TERM GOAL #1  ? Title The pt will answer simple biographical and orientation yes/no questions presented auditorily at 80% accuracy given minimal multimodal cues.   ? Time 10   ? Period --  sessions  ? Status Partially Met   ?  ? SLP SHORT TERM GOAL #2  ? Title Pt will improve speech approximation of functional language at the sentence level during 50% of conversation.   ? Time 10   ? Period --   sessions  ? Status Partially Met   ?  ? SLP SHORT TERM GOAL #3  ? Title The pt will identify the correct word given 2 choices at 80% accuracy given frequent visual cues in order to comprehend his bills.   ? Status Achieved   ?  ? SLP SHORT TERM GOAL #4  ? Title Pt will copy numbers with 80% accuracy given minimal cues.   ? Time 10   ? Period --   sessions  ? Status On-going   ?  ? SLP SHORT TERM GOAL #5  ? Title Pt will copy name of bill with 80% accuracy.   ? Time 10   ? Period --    sessions  ? Status On-going   ? ?  ?  ? ?  ? ? ? SLP Long Term Goals - 02/16/22 1815   ? ?  ? SLP LONG TERM GOAL #1  ? Title Pt will use multi-modal communication to communitcate basic wants and needs.   ? Status On-going   ? Target Date 04/19/22   ? ?  ?  ? ?  ? ? ? Plan - 03/20/22 1308   ? ? 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 In addition, pt has severe deficits in spelling and written language and is unable to spell or write his name. Patient has demonstrated significant iiprovement in verbal expression at the conversational level as well as improved comprehension of verbal questions and only requires minimal assist for repairing communication breakdown by identifying misunderstanding and need for writing.   I recommend continued skilled ST to improve intelligibility, verbal expression and swallowing safety necessary for increased communicatory success and independence in the home and community environments.   ? Speech Therapy Frequency 2x / week   ? Duration 12 weeks   ? Treatment/Interventions Language facilitation;Cueing hierarchy;Multimodal communcation approach;Compensatory strategies;SLP instruction and feedback;Functional tasks;Patient/family education   ? Potential to Achieve Goals Good   ? Potential Considerations Severity of impairments;Family/community support;Ability to learn/carryover information;Cooperation/participation level;Previous level of function   ? SLP Home Exercise Plan provided, see pt instructions section   ? Consulted and Agree with Plan of Care Patient;Family member/caregiver   ? Family Member Consulted pt's sister   ? ?  ?  ? ?  ? ? ?Patient will benefit from skilled therapeutic intervention in order to improve the following deficits and impairments:   ?Aphasia ? ?Cognitive communication deficit ? ? ? ?Problem List ?Patient Active Problem List  ? Diagnosis Date Noted  ? Ischemic cerebrovascular accident (CVA) of frontal  lobe ([place]) 05/12/2021  ? Stroke ([place]) 05/07/2021  ? Acute ischemic left MCA stroke ([place]) 05/07/2021  ? 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 ?T[place]ania L. Tannar Broker, M.A. [name] ?Adult-based Speech Language Pathologist ?[facility] ?([phone] ? ?Dalbert Batman, [name] ?03/20/2022, 1:11 PM ? ?[facility] ?[place], [place], [address] ?Phone: [phone]   Fax:  [phone] ? ? ?Name: TAYON PAREKH ?MRN: [id number] ?Date of Birth: Nov 21, 1966 ? ?

## 2022-03-22 ENCOUNTER — Ambulatory Visit: Payer: BC Managed Care – PPO | Admitting: Speech Pathology

## 2022-03-22 DIAGNOSIS — R4701 Aphasia: Secondary | ICD-10-CM

## 2022-03-22 DIAGNOSIS — R41841 Cognitive communication deficit: Secondary | ICD-10-CM

## 2022-03-22 DIAGNOSIS — I63512 Cerebral infarction due to unspecified occlusion or stenosis of left middle cerebral artery: Secondary | ICD-10-CM

## 2022-03-22 NOTE — Therapy (Signed)
[facility] ?[facility] ?[place], [place], [address] ?Phone: [phone]   Fax:  [phone] ? ?Speech Language Pathology Treatment ?Speech Therapy Progress Note ? ?Dates of Reporting Period: 02/20/2022 to 03/22/2022 ? ?Objective: Patient has been seen for 10 speech therapy visits this reporting period targeting aphasia. Patient is making excellent progress, LTG revised to reflect progress and has met 1/5 STGs and partially met 2/5 STG. See skilled intervention, clinical impressions and goals below for details.   ? ?Patient Details  ?Name: Peter Lucas ?MRN: [id number] ?Date of Birth: 1966/01/13 ?Referring Provider (SLP): Alysia Penna ? ? ?Encounter Date: 03/22/2022 ? ? End of Session - 03/22/22 1243   ? ? Visit Number 55   ? Number of Visits 67   ? Date for SLP Re-Evaluation 04/19/22   ? Authorization Type [employer]   ? Authorization Time Period 01/24/2022 thru 04/19/2022   ? Authorization - Visit Number 10   ? Authorization - Number of Visits 31   ? Progress Note Due on Visit 10   ? SLP Start Time 0900   ? SLP Stop Time  1000   ? SLP Time Calculation (min) 60 min   ? Activity Tolerance Patient tolerated treatment well   ? ?  ?  ? ?  ? ? ?Past Medical History:  ?Diagnosis Date  ? DM (diabetes mellitus) ([place])   ? Hyperlipidemia   ? Hypertension   ? ? ?Past Surgical History:  ?Procedure Laterality Date  ? BACK SURGERY    ? cyst removal  ? IR CT HEAD LTD  05/07/2021  ? IR PERCUTANEOUS ART THROMBECTOMY/INFUSION INTRACRANIAL INC DIAG ANGIO  05/07/2021  ? RADIOLOGY WITH ANESTHESIA N/A 05/07/2021  ? Procedure: IR WITH ANESTHESIA;  Surgeon: Radiologist, Medication, MD;  Location: [facility];  Service: Radiology;  Laterality: N/A;  ? ? ?There were no vitals filed for this visit. ? ? Subjective Assessment - 03/22/22 1235   ? ? Subjective Patient motivated and pleasantly participatory with sister present   ? Patient is accompained by: Family member   ? Currently in  Pain? No/denies   ? ?  ?  ? ?  ? ? ? ? ? ? ? ? ADULT SLP TREATMENT - 03/22/22 0001   ? ?  ? Treatment Provided  ? Treatment provided Cognitive-Linquistic   ?  ? Cognitive-Linquistic Treatment  ? Treatment focused on Aphasia;Apraxia;Patient/family/caregiver education   ? Skilled Treatment Skilled treatment session focused on pt's language impairments, specifically functional expressive and receptive language during contextually based conversation. SLP facilitated session by providing the following interventions:    Comprehension of verbal statements by SLP: pt able to comprehend with mod-max verbal cues.     Comprehension of questions with improved 70% accuracy x10 IND, improved to 100% accuracy with written questions x2 and verbal repetition x1. Patient provided an appropriate response to SLP statement x6 this session and demonstrated improved iniation of questions with x4 questions asked. Patient demonstrated auditory discrimination and comprehension via selecting a picture that matches the verbal single word with 100% accuracy x3 in F2 pictures. Increased difficulty to [facility] pictures with 100% on initial set x10 though 80% accuracy on more complex pictures/words in second set of x10, improved to 100% with repetition and visualized SLP production of word.   ?  ? Assessment / Recommendations / Plan  ? Plan Continue with current plan of care   ? ?  ?  ? ?  ? ? ?  SLP Education - 03/22/22 1242   ? ? Education Details new task, auditory comp   ? Person(s) Educated Patient;Other (comment)   ? Methods Explanation;Demonstration;Verbal cues   ? Comprehension Verbalized understanding;Returned demonstration;Need further instruction;Verbal cues required   ? ?  ?  ? ?  ? ? ? SLP Short Term Goals - 03/22/22 1244   ? ?  ? SLP SHORT TERM GOAL #1  ? Title The pt will answer simple biographical and orientation yes/no questions presented auditorily at 80% accuracy given minimal multimodal cues.   ? Status Partially Met   ?  ? SLP SHORT  TERM GOAL #2  ? Title Pt will improve speech approximation of functional language at the sentence level during 50% of conversation. MET, NEW GOAL during 80% of conversation   ? Status Achieved   ?  ? SLP SHORT TERM GOAL #3  ? Title The pt will identify the correct word given 2 choices at 80% accuracy given frequent visual cues in order to comprehend his bills.   ? Status Partially Met   ?  ? SLP SHORT TERM GOAL #4  ? Title Pt will copy numbers with 80% accuracy given minimal cues.   ? Status On-going   ?  ? SLP SHORT TERM GOAL #5  ? Title Pt will copy name of bill with 80% accuracy.   ? Status On-going   ? ?  ?  ? ?  ? ? ? SLP Long Term Goals - 03/22/22 1247   ? ?  ? SLP LONG TERM GOAL #1  ? Title Pt will use multi-modal communication to communitcate basic wants and needs. Met, NEW GOAL Patient will use multi-modal communication to communitcate complex wants and needs necessary for IADLS   ? Status Revised   ? ?  ?  ? ?  ? ? ? Plan - 03/22/22 1244   ? ? 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 In addition, pt has severe deficits in spelling and written language and is unable to spell or write his name. Patient has demonstrated significant improvement in verbal expression at the conversational level as well as improved comprehension of verbal questions and only requires minimal assist for repairing communication breakdown by identifying misunderstanding and need for writing.   I recommend continued skilled ST to improve intelligibility, verbal expression and swallowing safety necessary for increased communicatory success and independence in the home and community environments.   ? Speech Therapy Frequency 2x / week   ? Duration 12 weeks   ? Treatment/Interventions Language facilitation;Cueing hierarchy;Multimodal communcation approach;Compensatory strategies;SLP instruction and feedback;Functional tasks;Patient/family education   ? Potential Considerations  Severity of impairments;Family/community support;Ability to learn/carryover information;Cooperation/participation level;Previous level of function   ? SLP Home Exercise Plan provided   ? Consulted and Agree with Plan of Care Patient;Family member/caregiver   ? Family Member Consulted pt's sister   ? ?  ?  ? ?  ? ? ?Patient will benefit from skilled therapeutic intervention in order to improve the following deficits and impairments:   ?Aphasia ? ?Acute ischemic left MCA stroke ([place]) ? ?Cognitive communication deficit ? ? ? ?Problem List ?Patient Active Problem List  ? Diagnosis Date Noted  ? 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 Stroke ([place]) 05/07/2021  ? Acute ischemic left MCA stroke ([place]) 05/07/2021  ? 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 ? ?Dalbert Batman, [name] ?03/22/2022, 12:50 PM ? ?[facility] ?[place], [place], [address] ?Phone: [phone]   Fax:  [phone] ? ? ?Name: OAKES MCCREADY ?MRN: [id number] ?Date of Birth: [birth year] ? ?

## 2022-03-22 NOTE — Patient Instructions (Signed)
Continue conversation for practice at home ? ?

## 2022-03-27 ENCOUNTER — Ambulatory Visit: Payer: BC Managed Care – PPO | Admitting: Speech Pathology

## 2022-03-27 DIAGNOSIS — R4701 Aphasia: Secondary | ICD-10-CM | POA: Diagnosis not present

## 2022-03-27 DIAGNOSIS — I639 Cerebral infarction, unspecified: Secondary | ICD-10-CM

## 2022-03-27 DIAGNOSIS — R482 Apraxia: Secondary | ICD-10-CM

## 2022-03-27 DIAGNOSIS — I63312 Cerebral infarction due to thrombosis of left middle cerebral artery: Secondary | ICD-10-CM

## 2022-03-27 NOTE — Therapy (Signed)
[place] ?[facility] ?[place], [place], [address] ?Phone: [phone]   Fax:  [phone] ? ?Speech Language Pathology Treatment ? ?Patient Details  ?Name: Peter Lucas ?MRN: [id number] ?Date of Birth: 03/25/1966 ?Referring Provider (SLP): Peter Lucas ? ? ?Encounter Date: 03/27/2022 ? ? End of Session - 03/27/22 1326   ? ? Visit Number 28   ? Number of Visits 67   ? Date for SLP Re-Evaluation 04/19/22   ? Authorization Type [employer]   ? Authorization Time Period 01/24/2022 thru 04/19/2022   ? Authorization - Visit Number 1   ? Progress Note Due on Visit 10   ? SLP Start Time 0900   ? SLP Stop Time  1000   ? SLP Time Calculation (min) 60 min   ? Activity Tolerance Patient tolerated treatment well   ? ?  ?  ? ?  ? ? ?Past Medical History:  ?Diagnosis Date  ? DM (diabetes mellitus) ([place])   ? Hyperlipidemia   ? Hypertension   ? ? ?Past Surgical History:  ?Procedure Laterality Date  ? BACK SURGERY    ? cyst removal  ? IR CT HEAD LTD  05/07/2021  ? IR PERCUTANEOUS ART THROMBECTOMY/INFUSION INTRACRANIAL INC DIAG ANGIO  05/07/2021  ? RADIOLOGY WITH ANESTHESIA N/A 05/07/2021  ? Procedure: IR WITH ANESTHESIA;  Surgeon: Radiologist, Medication, MD;  Location: [facility];  Service: Radiology;  Laterality: N/A;  ? ? ?There were no vitals filed for this visit. ? ? Subjective Assessment - 03/27/22 1321   ? ? Subjective "Do you remember where you found this (pointing to medical alert bracelet) I need another style"   ? Patient is accompained by: Family member   ? Currently in Pain? No/denies   ? ?  ?  ? ?  ? ? ? ? ? ? ? ? ADULT SLP TREATMENT - 03/27/22 0001   ? ?  ? Treatment Provided  ? Treatment provided Cognitive-Linquistic   ?  ? Cognitive-Linquistic Treatment  ? Treatment focused on Aphasia;Apraxia;Patient/family/caregiver education   ? Skilled Treatment Skilled treatment session focused on pt's language impairments, specifically functional expressive and  receptive language during contextually based conversation and functional spelling task.      ? ?SLP facilitated session by providing the following interventions:         ? ?Comprehension of verbal statements by SLP: Heavy verbal cues x 5     ? ?Letter Identification: using alphabet board pt correctly selected letters with 68% improving 95% with min-mod cues     ? ?Speech Intelligibility during letter selection task: 81% when imitating SLP model for each letter and pt said each letter correctly under his breath as he was problem solving order of letters within each name   ? ?  ?  ? ?  ? ? ? SLP Education - 03/27/22 1325   ? ? Education Details progress with speech approximation   ? Person(s) Educated Patient;Other (comment)   pt's sister  ? Methods Explanation;Demonstration;Verbal cues   ? ?  ?  ? ?  ? ? ? SLP Short Term Goals - 03/22/22 1244   ? ?  ? SLP SHORT TERM GOAL #1  ? Title The pt will answer simple biographical and orientation yes/no questions presented auditorily at 80% accuracy given minimal multimodal cues.   ? Status Partially Met   ?  ? SLP SHORT TERM GOAL #2  ? Title Pt will improve speech approximation of functional  language at the sentence level during 50% of conversation. MET, NEW GOAL during 80% of conversation   ? Status Achieved   ?  ? SLP SHORT TERM GOAL #3  ? Title The pt will identify the correct word given 2 choices at 80% accuracy given frequent visual cues in order to comprehend his bills.   ? Status Partially Met   ?  ? SLP SHORT TERM GOAL #4  ? Title Pt will copy numbers with 80% accuracy given minimal cues.   ? Status On-going   ?  ? SLP SHORT TERM GOAL #5  ? Title Pt will copy name of bill with 80% accuracy.   ? Status On-going   ? ?  ?  ? ?  ? ? ? SLP Long Term Goals - 03/22/22 1247   ? ?  ? SLP LONG TERM GOAL #1  ? Title Pt will use multi-modal communication to communitcate basic wants and needs. Met, NEW GOAL Patient will use multi-modal communication to communitcate complex wants  and needs necessary for IADLS   ? Status Revised   ? ?  ?  ? ?  ? ? ? Plan - 03/27/22 1328   ? ? 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 Pt's verbal imitation of each letters was much improved during today's session. He also reports that [name] called pt's pastor and pastor is now sending a copy of her message/sermon to Peter Lucas. He reports being able to read it and he even knows the answers to questions within the sermon "I know the answers before she asks them, it is helping with my reading too." I recommend continued skilled ST to improve intelligibility, verbal expression and swallowing safety necessary for increased communicatory success and independence in the home and community environments.   ? Speech Therapy Frequency 2x / week   ? Duration 12 weeks   ? Treatment/Interventions Language facilitation;Cueing hierarchy;Multimodal communcation approach;Compensatory strategies;SLP instruction and feedback;Functional tasks;Patient/family education   ? Potential to Achieve Goals Good   ? Potential Considerations Severity of impairments;Family/community support;Ability to learn/carryover information;Cooperation/participation level;Previous level of function   ? Consulted and Agree with Plan of Care Patient;Family member/caregiver   ? Family Member Consulted pt's sister   ? ?  ?  ? ?  ? ? ?Patient will benefit from skilled therapeutic intervention in order to improve the following deficits and impairments:   ?Aphasia ? ?Apraxia ? ?Cerebrovascular accident (CVA) due to thrombosis of left middle cerebral artery ([place]) ? 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 ? ? ?Problem List ?Patient Active Problem List  ? Diagnosis Date Noted  ? 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 Stroke ([place]) 05/07/2021  ? Acute ischemic left MCA stroke ([place]) 05/07/2021  ? 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 ?Peter Lucas  B. Rutherford Nail, M.S., [name], CBIS ?Speech-Language Pathologist ?Rehabilitation Services ?Office [phone] ? ?[place], [name] ?03/27/2022, 1:30 PM ? ?[place] ?[facility] ?[place], [place], [address] ?Phone: [phone]   Fax:  [phone] ? ? ?Name: RONON FERGER ?MRN: [id number] ?Date of Birth: Apr 23, 1966 ? ?

## 2022-03-28 ENCOUNTER — Encounter: Payer: BC Managed Care – PPO | Admitting: Physical Medicine & Rehabilitation

## 2022-03-29 ENCOUNTER — Ambulatory Visit: Payer: BC Managed Care – PPO | Admitting: Speech Pathology

## 2022-03-29 DIAGNOSIS — I63312 Cerebral infarction due to thrombosis of left middle cerebral artery: Secondary | ICD-10-CM

## 2022-03-29 DIAGNOSIS — I639 Cerebral infarction, unspecified: Secondary | ICD-10-CM

## 2022-03-29 DIAGNOSIS — R482 Apraxia: Secondary | ICD-10-CM

## 2022-03-29 DIAGNOSIS — R4701 Aphasia: Secondary | ICD-10-CM | POA: Diagnosis not present

## 2022-03-31 NOTE — Therapy (Signed)
[facility] ?[facility] ?[place], [place], [address] ?Phone: [phone]   Fax:  [phone] ? ?Speech Language Pathology Treatment ? ?Patient Details  ?Name: Peter Lucas ?MRN: [id number] ?Date of Birth: [birth year] ?Referring Provider (SLP): Alysia Penna ? ? ?Encounter Date: 03/29/2022 ? ? End of Session - 03/31/22 0604   ? ? Visit Number 32   ? Number of Visits 67   ? Date for SLP Re-Evaluation 04/19/22   ? Authorization Type [employer]   ? Authorization Time Period 01/24/2022 thru 04/19/2022   ? Authorization - Visit Number 2   ? Progress Note Due on Visit 10   ? SLP Start Time 0900   ? SLP Stop Time  1000   ? SLP Time Calculation (min) 60 min   ? Activity Tolerance Patient tolerated treatment well   ? ?  ?  ? ?  ? ? ?Past Medical History:  ?Diagnosis Date  ? DM (diabetes mellitus) ([place])   ? Hyperlipidemia   ? Hypertension   ? ? ?Past Surgical History:  ?Procedure Laterality Date  ? BACK SURGERY    ? cyst removal  ? IR CT HEAD LTD  05/07/2021  ? IR PERCUTANEOUS ART THROMBECTOMY/INFUSION INTRACRANIAL INC DIAG ANGIO  05/07/2021  ? RADIOLOGY WITH ANESTHESIA N/A 05/07/2021  ? Procedure: IR WITH ANESTHESIA;  Surgeon: Radiologist, Medication, MD;  Location: [facility];  Service: Radiology;  Laterality: N/A;  ? ? ?There were no vitals filed for this visit. ? ? Subjective Assessment - 03/31/22 0602   ? ? Subjective pt pleasant, motivated, eager   ? Patient is accompained by: Family member   ? Currently in Pain? No/denies   ? ?  ?  ? ?  ? ? ? ? ? ? ? ? ADULT SLP TREATMENT - 03/31/22 0001   ? ?  ? Treatment Provided  ? Treatment provided Cognitive-Linquistic   ?  ? Cognitive-Linquistic Treatment  ? Treatment focused on Aphasia;Apraxia;Patient/family/caregiver education   ? Skilled Treatment Skilled treatment session focused on pt's language impairments, specifically his reading and writing deficits.      ? ?SLP facilitated session by providing the following  interventions:        ? ?Letter Identification: using alphabet board pt correctly selected letters with 62% improving 95% with rare min A      ? ?Speech Intelligibility when spelling name after writing each name: 85%      ? ?Reading Comprehension: [employer] 6: Functional Language; Reading phoneme discrimination ([id number]) 100% accuracy; Sentence Discrimination [phone]) 100%   ? ?  ?  ? ?  ? ? ? SLP Education - 03/31/22 0603   ? ? Education Details reading comprehension, speech approximation   ? Person(s) Educated Patient;Other (comment)   pt's sister  ? Methods Explanation;Demonstration;Handout   ? Comprehension Verbalized understanding;Returned demonstration;Need further instruction   ? ?  ?  ? ?  ? ? ? SLP Short Term Goals - 03/22/22 1244   ? ?  ? SLP SHORT TERM GOAL #1  ? Title The pt will answer simple biographical and orientation yes/no questions presented auditorily at 80% accuracy given minimal multimodal cues.   ? Status Partially Met   ?  ? SLP SHORT TERM GOAL #2  ? Title Pt will improve speech approximation of functional language at the sentence level during 50% of conversation. MET, NEW GOAL during 80% of conversation   ? Status Achieved   ?  ? SLP SHORT TERM  GOAL #3  ? Title The pt will identify the correct word given 2 choices at 80% accuracy given frequent visual cues in order to comprehend his bills.   ? Status Partially Met   ?  ? SLP SHORT TERM GOAL #4  ? Title Pt will copy numbers with 80% accuracy given minimal cues.   ? Status On-going   ?  ? SLP SHORT TERM GOAL #5  ? Title Pt will copy name of bill with 80% accuracy.   ? Status On-going   ? ?  ?  ? ?  ? ? ? SLP Long Term Goals - 03/22/22 1247   ? ?  ? SLP LONG TERM GOAL #1  ? Title Pt will use multi-modal communication to communitcate basic wants and needs. Met, NEW GOAL Patient will use multi-modal communication to communitcate complex wants and needs necessary for IADLS   ? Status Revised   ? ?  ?  ? ?  ? ? ? Plan - 03/31/22 0605   ? ? Clinical  Impression Statement Pt presents with improved speech intelligibility and reading comprehension. He continues to make progress towards his goals and is experiencing increased functional independence. As a result, he continues to be a good canidate for skilled ST intervention.   ? Speech Therapy Frequency 2x / week   ? Duration 12 weeks   ? Treatment/Interventions Language facilitation;Cueing hierarchy;Multimodal communcation approach;Compensatory strategies;SLP instruction and feedback;Functional tasks;Patient/family education   ? Potential to Achieve Goals Good   ? SLP Home Exercise Plan provided, see pt instructions section   ? Consulted and Agree with Plan of Care Patient;Family member/caregiver   ? Family Member Consulted pt's sister   ? ?  ?  ? ?  ? ? ?Patient will benefit from skilled therapeutic intervention in order to improve the following deficits and impairments:   ?Aphasia ? ?Apraxia ? ?Cerebrovascular accident (CVA) due to thrombosis of left middle cerebral artery ([place]) ? 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 ? ? ?Problem List ?Patient Active Problem List  ? Diagnosis Date Noted  ? 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 ([place]) 05/12/2021  ? Stroke ([place]) 05/07/2021  ? Acute ischemic left MCA stroke ([place]) 05/07/2021  ? 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 ?Fidencio Duddy B. Rutherford Nail, M.S., [name], CBIS ?Speech-Language Pathologist ?Rehabilitation Services ?Office [phone] ? ?[place], [place] ?03/31/2022, 6:07 AM ? ?[facility] ?[facility] ?[place], [place], [address] ?Phone: [phone]   Fax:  [phone] ? ? ?Name: Peter Lucas ?MRN: [id number] ?Date of Birth: Nov 15, 1966 ? ?

## 2022-03-31 NOTE — Patient Instructions (Signed)
Additional copies of alphabet board provided for pt to practice spelling his name and those of his children ?

## 2022-04-03 ENCOUNTER — Ambulatory Visit: Payer: BC Managed Care – PPO | Attending: Physical Medicine & Rehabilitation | Admitting: Speech Pathology

## 2022-04-03 DIAGNOSIS — I639 Cerebral infarction, unspecified: Secondary | ICD-10-CM | POA: Insufficient documentation

## 2022-04-03 DIAGNOSIS — R4701 Aphasia: Secondary | ICD-10-CM | POA: Insufficient documentation

## 2022-04-03 DIAGNOSIS — I6602 Occlusion and stenosis of left middle cerebral artery: Secondary | ICD-10-CM | POA: Diagnosis present

## 2022-04-03 DIAGNOSIS — R482 Apraxia: Secondary | ICD-10-CM | POA: Insufficient documentation

## 2022-04-03 DIAGNOSIS — I63312 Cerebral infarction due to thrombosis of left middle cerebral artery: Secondary | ICD-10-CM | POA: Insufficient documentation

## 2022-04-03 NOTE — Therapy (Signed)
[facility] ?[facility] ?[place], [place], [address] ?Phone: [phone]   Fax:  [phone] ? ?Speech Language Pathology Treatment ? ?Patient Details  ?Name: Peter Lucas ?MRN: [id number] ?Date of Birth: Jan 27, 1966 ?Referring Provider (SLP): Alysia Penna ? ? ?Encounter Date: 04/03/2022 ? ? End of Session - 04/03/22 1338   ? ? Visit Number 47   ? Number of Visits 67   ? Date for SLP Re-Evaluation 04/19/22   ? Authorization Type [employer]   ? Authorization Time Period 01/24/2022 thru 04/19/2022   ? Authorization - Visit Number 3   ? Progress Note Due on Visit 10   ? SLP Start Time 0900   ? SLP Stop Time  1000   ? SLP Time Calculation (min) 60 min   ? Activity Tolerance Patient tolerated treatment well   ? ?  ?  ? ?  ? ? ?Past Medical History:  ?Diagnosis Date  ? DM (diabetes mellitus) ([place])   ? Hyperlipidemia   ? Hypertension   ? ? ?Past Surgical History:  ?Procedure Laterality Date  ? BACK SURGERY    ? cyst removal  ? IR CT HEAD LTD  05/07/2021  ? IR PERCUTANEOUS ART THROMBECTOMY/INFUSION INTRACRANIAL INC DIAG ANGIO  05/07/2021  ? RADIOLOGY WITH ANESTHESIA N/A 05/07/2021  ? Procedure: IR WITH ANESTHESIA;  Surgeon: Radiologist, Medication, MD;  Location: [facility];  Service: Radiology;  Laterality: N/A;  ? ? ?There were no vitals filed for this visit. ? ? Subjective Assessment - 04/03/22 1330   ? ? Subjective pt pleasant, motivated, eager   ? Patient is accompained by: Family member   ? Currently in Pain? No/denies   ? ?  ?  ? ?  ? ? ? ? ? ? ? ? ADULT SLP TREATMENT - 04/03/22 0001   ? ?  ? Treatment Provided  ? Treatment provided Cognitive-Linquistic   ?  ? Cognitive-Linquistic Treatment  ? Treatment focused on Aphasia;Apraxia;Patient/family/caregiver education   ? Skilled Treatment Skilled treatment intervention targeted language impairments, specifically his reading deficits to improve multimodal communication and independence in community.     SLP  facilitated session by providing the following interventions:       Reading Comprehension: [place] 6: Functional Language; Reading word discrimination ([id number]) by matching word to picture scened with F16. Patient  found the appropriate matching written word with 60% accuracy improved to 100% with progressive reduction of field of words. Paitent read a simple sentence describing and object and demonstrated reading comprehension by identifying the object with 100% accuracy x8 indicating readiness for advancement in task   ? ?  ?  ? ?  ? ? ? SLP Education - 04/03/22 1338   ? ? Education Details reading comprehension improvement   ? Person(s) Educated Patient   ? Methods Explanation;Demonstration   ? Comprehension Verbalized understanding;Need further instruction   ? ?  ?  ? ?  ? ? ? SLP Short Term Goals - 03/22/22 1244   ? ?  ? SLP SHORT TERM GOAL #1  ? Title The pt will answer simple biographical and orientation yes/no questions presented auditorily at 80% accuracy given minimal multimodal cues.   ? Status Partially Met   ?  ? SLP SHORT TERM GOAL #2  ? Title Pt will improve speech approximation of functional language at the sentence level during 50% of conversation. MET, NEW GOAL during 80% of conversation   ? Status Achieved   ?  ?  SLP SHORT TERM GOAL #3  ? Title The pt will identify the correct word given 2 choices at 80% accuracy given frequent visual cues in order to comprehend his bills.   ? Status Partially Met   ?  ? SLP SHORT TERM GOAL #4  ? Title Pt will copy numbers with 80% accuracy given minimal cues.   ? Status On-going   ?  ? SLP SHORT TERM GOAL #5  ? Title Pt will copy name of bill with 80% accuracy.   ? Status On-going   ? ?  ?  ? ?  ? ? ? SLP Long Term Goals - 03/22/22 1247   ? ?  ? SLP LONG TERM GOAL #1  ? Title Pt will use multi-modal communication to communitcate basic wants and needs. Met, NEW GOAL Patient will use multi-modal communication to communitcate complex wants and needs necessary for  IADLS   ? Status Revised   ? ?  ?  ? ?  ? ? ? Plan - 04/03/22 1339   ? ? Clinical Impression Statement Pt presents with improved speech intelligibility and reading comprehension. He continues to make progress towards his goals and is experiencing increased functional independence. As a result, he continues to be a good canidate for skilled ST intervention.   ? Speech Therapy Frequency 2x / week   ? Duration 12 weeks   ? Treatment/Interventions Language facilitation;Cueing hierarchy;Multimodal communcation approach;Compensatory strategies;SLP instruction and feedback;Functional tasks;Patient/family education   ? Potential to Achieve Goals Good   ? Potential Considerations Severity of impairments;Family/community support;Ability to learn/carryover information;Cooperation/participation level;Previous level of function   ? Consulted and Agree with Plan of Care Patient;Family member/caregiver   ? Family Member Consulted pt's sister   ? ?  ?  ? ?  ? ? ?Patient will benefit from skilled therapeutic intervention in order to improve the following deficits and impairments:   ?Aphasia ? ? ? ?Problem List ?Patient Active Problem List  ? Diagnosis Date Noted  ? 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 Stroke ([place]) 05/07/2021  ? Acute ischemic left MCA stroke ([place]) 05/07/2021  ? 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 ?T[place]ania L. Marium Ragan, M.A. [name] ?Adult-based Speech Language Pathologist ?[facility] ?([phone] ? ?Tanzania L Richerd Grime, [name] ?04/03/2022, 1:40 PM ? ?[place] ?[facility] ?[place], [place], [address] ?Phone: [phone]   Fax:  [phone] ? ? ?Name: Peter Lucas ?MRN: [id number] ?Date of Birth: November 01, 1966 ? ?

## 2022-04-05 ENCOUNTER — Ambulatory Visit: Payer: BC Managed Care – PPO | Admitting: Speech Pathology

## 2022-04-05 DIAGNOSIS — R4701 Aphasia: Secondary | ICD-10-CM

## 2022-04-05 NOTE — Therapy (Signed)
[facility] ?[facility] ?[place], [place], [address] ?Phone: [phone]   Fax:  [phone] ? ?Speech Language Pathology Treatment ? ?Patient Details  ?Name: Peter Lucas ?MRN: [id number] ?Date of Birth: 05/06/1966 ?Referring Provider (SLP): Alysia Penna ? ? ?Encounter Date: 04/05/2022 ? ? End of Session - 04/05/22 1752   ? ? Visit Number 11   ? Number of Visits 67   ? Date for SLP Re-Evaluation 04/19/22   ? Authorization Type [employer]   ? Authorization Time Period 01/24/2022 thru 04/19/2022   ? Authorization - Visit Number 4   ? Authorization - Number of Visits 31   ? Progress Note Due on Visit 10   ? SLP Start Time 0900   ? SLP Stop Time  1000   ? SLP Time Calculation (min) 60 min   ? Activity Tolerance Patient tolerated treatment well   ? ?  ?  ? ?  ? ? ?Past Medical History:  ?Diagnosis Date  ? DM (diabetes mellitus) ([place])   ? Hyperlipidemia   ? Hypertension   ? ? ?Past Surgical History:  ?Procedure Laterality Date  ? BACK SURGERY    ? cyst removal  ? IR CT HEAD LTD  05/07/2021  ? IR PERCUTANEOUS ART THROMBECTOMY/INFUSION INTRACRANIAL INC DIAG ANGIO  05/07/2021  ? RADIOLOGY WITH ANESTHESIA N/A 05/07/2021  ? Procedure: IR WITH ANESTHESIA;  Surgeon: Radiologist, Medication, MD;  Location: [facility];  Service: Radiology;  Laterality: N/A;  ? ? ?There were no vitals filed for this visit. ? ? Subjective Assessment - 04/05/22 1746   ? ? Subjective pt pleasant, motivated, eager   ? Patient is accompained by: Family member   ? Currently in Pain? No/denies   ? ?  ?  ? ?  ? ? ? ? ? ? ? ? ADULT SLP TREATMENT - 04/05/22 0001   ? ?  ? Treatment Provided  ? Treatment provided Cognitive-Linquistic   ?  ? Cognitive-Linquistic Treatment  ? Treatment focused on Aphasia;Apraxia;Patient/family/caregiver education   ? Skilled Treatment Skilled treatment intervention targeted language impairments, specifically his reading deficits to improve multimodal communication and  independence in community.     SLP facilitated session by providing the following interventions: Auditory comprehension within conversational speech with 62% accuracy with unfamiliar partner (clinician) improved to 100% accuracy with written component. Patient demonstrated 100% accuracy with familiar partner (sister) given only minimal assist by sister repeating herself 1-2x. Reading Comprehension targeted by selecting the appropriate written word in a F2 when verbalized by SLP (required auditory discrimination to aid auditory comprehension as well) which patient demonstrated 73% accuracy x15 with increased improvement for the latter half of trial indicative of improvement with continued practice to task.   ? ?  ?  ? ?  ? ? ? SLP Education - 04/05/22 1752   ? ? Education Details multimodal communication   ? Person(s) Educated Patient   ? Methods Explanation;Demonstration   ? Comprehension Verbalized understanding;Need further instruction   ? ?  ?  ? ?  ? ? ? SLP Short Term Goals - 03/22/22 1244   ? ?  ? SLP SHORT TERM GOAL #1  ? Title The pt will answer simple biographical and orientation yes/no questions presented auditorily at 80% accuracy given minimal multimodal cues.   ? Status Partially Met   ?  ? SLP SHORT TERM GOAL #2  ? Title Pt will improve speech approximation of functional language at the sentence  level during 50% of conversation. MET, NEW GOAL during 80% of conversation   ? Status Achieved   ?  ? SLP SHORT TERM GOAL #3  ? Title The pt will identify the correct word given 2 choices at 80% accuracy given frequent visual cues in order to comprehend his bills.   ? Status Partially Met   ?  ? SLP SHORT TERM GOAL #4  ? Title Pt will copy numbers with 80% accuracy given minimal cues.   ? Status On-going   ?  ? SLP SHORT TERM GOAL #5  ? Title Pt will copy name of bill with 80% accuracy.   ? Status On-going   ? ?  ?  ? ?  ? ? ? SLP Long Term Goals - 03/22/22 1247   ? ?  ? SLP LONG TERM GOAL #1  ? Title Pt will  use multi-modal communication to communitcate basic wants and needs. Met, NEW GOAL Patient will use multi-modal communication to communitcate complex wants and needs necessary for IADLS   ? Status Revised   ? ?  ?  ? ?  ? ? ? Plan - 04/05/22 1753   ? ? Clinical Impression Statement Pt presents with improved speech intelligibility and reading comprehension. He continues to make progress towards his goals and is experiencing increased functional independence. As a result, he continues to be a good canidate for skilled ST intervention.   ? ?  ?  ? ?  ? ? ?Patient will benefit from skilled therapeutic intervention in order to improve the following deficits and impairments:   ?Aphasia ? ? ? ?Problem List ?Patient Active Problem List  ? Diagnosis Date Noted  ? 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 ([place]) 05/12/2021  ? Stroke ([place]) 05/07/2021  ? Acute ischemic left MCA stroke ([place]) 05/07/2021  ? 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 ?Tanzania L. Marabeth Melland, M.A. [name] ?Adult-based Speech Language Pathologist ?[facility] ?[phone] ? ?Dalbert Batman, [name] ?04/05/2022, 5:53 PM ? ?[place] ?[facility] ?[place], [place], [address] ?Phone: [phone]   Fax:  [phone] ? ? ?Name: Peter Lucas ?MRN: [id number] ?Date of Birth: Sep 24, 1966 ? ?

## 2022-04-05 NOTE — Patient Instructions (Signed)
Continue conversational practice with family with use of written/reading for increased comprehension  ?

## 2022-04-10 ENCOUNTER — Ambulatory Visit: Payer: BC Managed Care – PPO | Admitting: Speech Pathology

## 2022-04-10 DIAGNOSIS — R4701 Aphasia: Secondary | ICD-10-CM

## 2022-04-10 DIAGNOSIS — I63312 Cerebral infarction due to thrombosis of left middle cerebral artery: Secondary | ICD-10-CM

## 2022-04-10 DIAGNOSIS — I639 Cerebral infarction, unspecified: Secondary | ICD-10-CM

## 2022-04-10 DIAGNOSIS — R482 Apraxia: Secondary | ICD-10-CM

## 2022-04-11 ENCOUNTER — Encounter: Payer: Self-pay | Admitting: Physical Medicine & Rehabilitation

## 2022-04-11 ENCOUNTER — Encounter
Payer: BC Managed Care – PPO | Attending: Physical Medicine & Rehabilitation | Admitting: Physical Medicine & Rehabilitation

## 2022-04-11 VITALS — BP 157/83 | HR 67 | Ht 72.0 in | Wt 289.0 lb

## 2022-04-11 DIAGNOSIS — R4701 Aphasia: Secondary | ICD-10-CM | POA: Diagnosis present

## 2022-04-11 NOTE — Therapy (Signed)
[facility] ?[facility] ?[place], [place], [address] ?Phone: [phone]   Fax:  [phone] ? ?Speech Language Pathology Treatment ? ?Patient Details  ?Name: Peter Lucas ?MRN: [id number] ?Date of Birth: 17-Mar-1966 ?Referring Provider (SLP): Alysia Penna ? ? ?Encounter Date: 04/10/2022 ? ? End of Session - 04/11/22 2107   ? ? Visit Number 6   ? Number of Visits 67   ? Date for SLP Re-Evaluation 04/19/22   ? Authorization Type [employer]   ? Authorization Time Period 01/24/2022 thru 04/19/2022   ? Authorization - Visit Number 5   ? Progress Note Due on Visit 10   ? SLP Start Time 0900   ? SLP Stop Time  1000   ? SLP Time Calculation (min) 60 min   ? Activity Tolerance Patient tolerated treatment well   ? ?  ?  ? ?  ? ? ?Past Medical History:  ?Diagnosis Date  ? DM (diabetes mellitus) ([facility])   ? Hyperlipidemia   ? Hypertension   ? ? ?Past Surgical History:  ?Procedure Laterality Date  ? BACK SURGERY    ? cyst removal  ? IR CT HEAD LTD  05/07/2021  ? IR PERCUTANEOUS ART THROMBECTOMY/INFUSION INTRACRANIAL INC DIAG ANGIO  05/07/2021  ? RADIOLOGY WITH ANESTHESIA N/A 05/07/2021  ? Procedure: IR WITH ANESTHESIA;  Surgeon: Radiologist, Medication, MD;  Location: [facility];  Service: Radiology;  Laterality: N/A;  ? ? ?There were no vitals filed for this visit. ? ? Subjective Assessment - 04/11/22 2105   ? ? Subjective Do you have a lot to talk about (referencing SLP's trip with daughter's school)   ? Patient is accompained by: Family member   ? Currently in Pain? No/denies   ? ?  ?  ? ?  ? ? ? ? ? ? ? ? ADULT SLP TREATMENT - 04/11/22 0001   ? ?  ? Treatment Provided  ? Treatment provided Cognitive-Linquistic   ?  ? Cognitive-Linquistic Treatment  ? Treatment focused on Aphasia;Apraxia;Patient/family/caregiver education   ? Skilled Treatment Skilled treatment session focused on pt's language impairments, specifically his reading and writing deficits.     SLP  facilitated session by providing the following interventions:        Receptive Language: Mod I use of strategies as pt said "write that down" x 1 during decreased understanding of conversational statements    Written language spelling: Pt independently wrote his name, children's names, his birthdate November 24, 1966) x 3 separate occasions    Speech Intelligibility: maximal assistance to say age (10); unaware of incorrect responses of 88, 98; Pt with extensive apraxia and neologisms when reading aloud phrase length 1-step directions resulting in 3 of 20 utterances being intelligible    Reading Comprehension: [facility] 6: Reading Comprehension, Following Written Directions (p 262) independently achieved 17 out of 20 accuracy;  Pages 43-44 from [facility] 1: Aphasia Rehab, Unit 1 - matching and identification were used for reading comprehension - 93% at the word level and 73% at the phrase level   ? ?  ?  ? ?  ? ? ? SLP Education - 04/11/22 2106   ? ? Education Details great progress with writing   ? Person(s) Educated Patient;Other (comment)   pt's sister  ? Methods Explanation;Demonstration   ? Comprehension Verbalized understanding   ? ?  ?  ? ?  ? ? ? SLP Short Term Goals - 03/22/22 1244   ? ?  ?  SLP SHORT TERM GOAL #1  ? Title The pt will answer simple biographical and orientation yes/no questions presented auditorily at 80% accuracy given minimal multimodal cues.   ? Status Partially Met   ?  ? SLP SHORT TERM GOAL #2  ? Title Pt will improve speech approximation of functional language at the sentence level during 50% of conversation. MET, NEW GOAL during 80% of conversation   ? Status Achieved   ?  ? SLP SHORT TERM GOAL #3  ? Title The pt will identify the correct word given 2 choices at 80% accuracy given frequent visual cues in order to comprehend his bills.   ? Status Partially Met   ?  ? SLP SHORT TERM GOAL #4  ? Title Pt will copy numbers with 80% accuracy given minimal cues.   ? Status On-going   ?  ? SLP SHORT TERM  GOAL #5  ? Title Pt will copy name of bill with 80% accuracy.   ? Status On-going   ? ?  ?  ? ?  ? ? ? SLP Long Term Goals - 03/22/22 1247   ? ?  ? SLP LONG TERM GOAL #1  ? Title Pt will use multi-modal communication to communitcate basic wants and needs. Met, NEW GOAL Patient will use multi-modal communication to communitcate complex wants and needs necessary for IADLS   ? Status Revised   ? ?  ?  ? ?  ? ? ? Plan - 04/11/22 2108   ? ? Clinical Impression Statement Pt presents with improved speech intelligibility and reading comprehension. He continues to make progress towards his goals and is experiencing increased functional independence. As a result, he continues to be a good canidate for skilled ST intervention.   ? Speech Therapy Frequency 2x / week   ? Duration 12 weeks   ? Treatment/Interventions Language facilitation;Cueing hierarchy;Multimodal communcation approach;Compensatory strategies;SLP instruction and feedback;Functional tasks;Patient/family education   ? Potential to Achieve Goals Good   ? Potential Considerations Severity of impairments;Family/community support;Ability to learn/carryover information;Cooperation/participation level;Previous level of function   ? Consulted and Agree with Plan of Care Patient;Family member/caregiver   ? Family Member Consulted pt's sister   ? ?  ?  ? ?  ? ? ?Patient will benefit from skilled therapeutic intervention in order to improve the following deficits and impairments:   ?Aphasia ? ?Apraxia ? ?Cerebrovascular accident (CVA) due to thrombosis of left middle cerebral artery ([place]) ? 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 ? ? ?Problem List ?Patient Active Problem List  ? Diagnosis Date Noted  ? 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 Stroke ([facility]) 05/07/2021  ? Acute ischemic left MCA stroke ([place]) 05/07/2021  ? 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 ?Xylon Croom B. Rutherford Nail, M.S., [name],  CBIS ?Speech-Language Pathologist ?Rehabilitation Services ?Office [phone] ? ?[place], [place] ?04/11/2022, 9:09 PM ? ?[place] ?[facility] ?[place], [place], [address] ?Phone: [phone]   Fax:  [phone] ? ? ?Name: FRANZ SVEC ?MRN: [id number] ?Date of Birth: [birth year] ? ?

## 2022-04-11 NOTE — Progress Notes (Signed)
? ?Subjective:  ? ? Patient ID: Peter Lucas, male    DOB: 09-09-1966, 56 y.o.   MRN: [id number] ? 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  He continues to receive speech therapy services at [facility] outpatient.  The patient is independent with all self-care and mobility. ?The patient has no new issues or falls.  He is back to driving.  His main issue he just is not able to understand verbal language and express himself very well.  He does a little bit better with reading and his pastor has printed out sermon so he can follow along in church.  No falls or injuries recently.  No longer is followed by neurology.  Still sees primary care on a regular basis. ?Pain Inventory ?Average Pain 0 ?Pain Right Now 0 ?My pain is  no pain ? ?LOCATION OF PAIN  no pain ? ?BOWEL ?Number of stools per week: 14 ?Oral laxative use No  ?Type of laxative na ?Enema or suppository use No  ?History of colostomy No  ?Incontinent No  ? ?BLADDER ?Normal ?In and out cath, frequency na ?Able to self cath  . ?Bladder incontinence No  ?Frequent urination No  ?Leakage with coughing No  ?Difficulty starting stream No  ?Incomplete bladder emptying No  ? ? ?Mobility ?ability to climb steps?  yes ?do you drive?  yes ? ?Function ?disabled: date disabled 05/07/21 ? ?Neuro/Psych ?No problems in this area ? ?Prior Studies ?Any changes since last visit?  yes ? ?Physicians involved in your care ?Any changes since last visit?  yes ?Primary care Danielle Carter,PA ? ? ?Family History  ?Problem Relation Age of Onset  ? Heart failure Mother   ? Stroke Mother   ? ?Social History  ? ?Socioeconomic History  ? Marital status: Single  ?  Spouse name: Not on file  ? Number of children: Not on file  ? Years of education: Not on file  ? Highest education level: Not on file  ?Occupational History  ? Not on file  ?Tobacco Use  ? Smoking status: Former  ?  Types: Cigarettes  ? Smokeless tobacco:  Never  ?Vaping Use  ? Vaping Use: Never used  ?Substance and Sexual Activity  ? Alcohol use: Not Currently  ? Drug use: Never  ? Sexual activity: Not on file  ?Other Topics Concern  ? Not on file  ?Social History Narrative  ? Not on file  ? ?Social Determinants of Health  ? ?Financial Resource Strain: Not on file  ?Food Insecurity: Not on file  ?Transportation Needs: Not on file  ?Physical Activity: Not on file  ?Stress: Not on file  ?Social Connections: Not on file  ? ?Past Surgical History:  ?Procedure Laterality Date  ? BACK SURGERY    ? cyst removal  ? IR CT HEAD LTD  05/07/2021  ? IR PERCUTANEOUS ART THROMBECTOMY/INFUSION INTRACRANIAL INC DIAG ANGIO  05/07/2021  ? RADIOLOGY WITH ANESTHESIA N/A 05/07/2021  ? Procedure: IR WITH ANESTHESIA;  Surgeon: Radiologist, Medication, MD;  Location: [facility];  Service: Radiology;  Laterality: N/A;  ? ?Past Medical History:  ?Diagnosis Date  ? DM (diabetes mellitus) ([facility])   ? Hyperlipidemia   ? Hypertension   ? ?BP (!) 157/83   Pulse 67   Ht 6' (1.829 m)   Wt 289 lb (131.1 kg)   SpO2 98%   BMI 39.20 kg/m?  ? ?Opioid Risk Score:   ?Fall Risk Score:  `  1 ? ?Depression screen PHQ 2/9 ? ? ?  04/11/2022  ? 11:50 AM 06/08/2021  ? 10:21 AM  ?Depression screen PHQ 2/9  ?Decreased Interest 0 1  ?Down, Depressed, Hopeless 0 0  ?PHQ - 2 Score 0 1  ?Altered sleeping  0  ?Tired, decreased energy  0  ?Change in appetite  0  ?Feeling bad or failure about yourself   0  ?Trouble concentrating  1  ?Moving slowly or fidgety/restless  0  ?Suicidal thoughts  0  ?PHQ-9 Score  2  ?  ? ?Review of Systems ? ?   ?Objective:  ? Physical Exam ?Constitutional:   ?   Appearance: He is obese.  ?HENT:  ?   Head: Normocephalic and atraumatic.  ?Eyes:  ?   Extraocular Movements: Extraocular movements intact.  ?   Conjunctiva/sclera: Conjunctivae normal.  ?   Pupils: Pupils are equal, round, and reactive to light.  ?Musculoskeletal:  ?   Right lower leg: No edema.  ?   Left lower leg: No edema.  ?   Comments: No pain  with upper extremity or lower extremity range of motion  ?Skin: ?   General: Skin is warm and dry.  ?Neurological:  ?   Mental Status: He is alert and oriented to person, place, and time.  ?   Comments: Motor strength is 5/5 bilateral deltoid bicep tricep grip ?The patient is able to answer simple questions with yes no.  Has difficulty following commands such as raise his right arm or his left leg.  He has difficulty with imitating gestures other than very simple gestures. ?He is ambulating relating without assistive device no evidence of toe drag or knee instability standing thing balance is good. ?Tone is normal in the upper and lower limbs ?Normal sensation upper and lower limbs  ?Psychiatric:     ?   Mood and Affect: Mood normal.     ?   Behavior: Behavior normal.  ? ? ? ? ? ?   ?Assessment & Plan:  ? ?1.  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  Do not think that his long-term prognosis for return to work is good.  His communication is very limited.  From a motor standpoint he is doing fairly well however. ?I will see him back in 6 months ?Continue follow-up with PCP ?Continue speech therapy efforts he is 1 year post stroke so any improvements at this point would likely be minimal ? ?Discussed long-term disability with patient and his daughter. ?

## 2022-04-12 ENCOUNTER — Ambulatory Visit: Payer: BC Managed Care – PPO | Admitting: Speech Pathology

## 2022-04-12 DIAGNOSIS — R482 Apraxia: Secondary | ICD-10-CM

## 2022-04-12 DIAGNOSIS — I63312 Cerebral infarction due to thrombosis of left middle cerebral artery: Secondary | ICD-10-CM

## 2022-04-12 DIAGNOSIS — R4701 Aphasia: Secondary | ICD-10-CM | POA: Diagnosis not present

## 2022-04-12 DIAGNOSIS — I639 Cerebral infarction, unspecified: Secondary | ICD-10-CM

## 2022-04-12 NOTE — Therapy (Signed)
[facility] ?[facility] ?[address], [place], [address] ?Phone: [phone]   Fax:  [phone] ? ?Speech Language Pathology Treatment ? ?Patient Details  ?Name: Peter Lucas ?MRN: [id number] ?Date of Birth: 17-Jul-1966 ?Referring Provider (SLP): Alysia Penna ? ? ?Encounter Date: 04/12/2022 ? ? End of Session - 04/12/22 2024   ? ? Visit Number 43   ? Number of Visits 67   ? Date for SLP Re-Evaluation 04/19/22   ? Authorization Type [employer]   ? Authorization Time Period 01/24/2022 thru 04/19/2022   ? Authorization - Visit Number 6   ? Progress Note Due on Visit 10   ? SLP Start Time 0900   ? SLP Stop Time  1000   ? SLP Time Calculation (min) 60 min   ? Activity Tolerance Patient tolerated treatment well   ? ?  ?  ? ?  ? ? ?Past Medical History:  ?Diagnosis Date  ? DM (diabetes mellitus) ([place])   ? Hyperlipidemia   ? Hypertension   ? ? ?Past Surgical History:  ?Procedure Laterality Date  ? BACK SURGERY    ? cyst removal  ? IR CT HEAD LTD  05/07/2021  ? IR PERCUTANEOUS ART THROMBECTOMY/INFUSION INTRACRANIAL INC DIAG ANGIO  05/07/2021  ? RADIOLOGY WITH ANESTHESIA N/A 05/07/2021  ? Procedure: IR WITH ANESTHESIA;  Surgeon: Radiologist, Medication, MD;  Location: [facility];  Service: Radiology;  Laterality: N/A;  ? ? ?There were no vitals filed for this visit. ? ? Subjective Assessment - 04/12/22 2019   ? ? Subjective today is pt's birthday   ? Patient is accompained by: Family member   ? Currently in Pain? No/denies   ? ?  ?  ? ?  ? ? ? ? ? ? ? ? ADULT SLP TREATMENT - 04/12/22 0001   ? ?  ? Treatment Provided  ? Treatment provided Cognitive-Linquistic   ?  ? Cognitive-Linquistic Treatment  ? Treatment focused on Aphasia;Apraxia;Patient/family/caregiver education   ? Skilled Treatment Skilled treatment session focused on pt's language impairments, specifically his reading and writing deficits.      ? ?SLP facilitated session by providing the following  interventions:         ? ?Receptive Language: Mod I use of strategies as pt said "write that down" x 1 during decreased understanding of conversational statements when asking about his daughter's boyfriend     ? ?Written language spelling:  ?Pt independently wrote his name, children's names, his birthdate November 04, 1966) x 3 separate occasions   ? ?Reading Comprehension: TalkPath Therapy utilized  Describe the Picture: Level 2 - 100%; Level 3 - 100%  Sentence Completion: Level 2 - 70% improving to 100% with SLP reading sentence and word choices; Level 3 - 80% improving to 100% with SLP reading sentence and choices   ? ?  ?  ? ?  ? ? ? SLP Education - 04/12/22 2021   ? ? Education Details improved reading ability   ? Person(s) Educated Patient;Other (comment)   pt's sister  ? Methods Explanation;Demonstration   ? Comprehension Verbalized understanding   ? ?  ?  ? ?  ? ? ? SLP Short Term Goals - 03/22/22 1244   ? ?  ? SLP SHORT TERM GOAL #1  ? Title The pt will answer simple biographical and orientation yes/no questions presented auditorily at 80% accuracy given minimal multimodal cues.   ? Status Partially Met   ?  ?  SLP SHORT TERM GOAL #2  ? Title Pt will improve speech approximation of functional language at the sentence level during 50% of conversation. MET, NEW GOAL during 80% of conversation   ? Status Achieved   ?  ? SLP SHORT TERM GOAL #3  ? Title The pt will identify the correct word given 2 choices at 80% accuracy given frequent visual cues in order to comprehend his bills.   ? Status Partially Met   ?  ? SLP SHORT TERM GOAL #4  ? Title Pt will copy numbers with 80% accuracy given minimal cues.   ? Status On-going   ?  ? SLP SHORT TERM GOAL #5  ? Title Pt will copy name of bill with 80% accuracy.   ? Status On-going   ? ?  ?  ? ?  ? ? ? SLP Long Term Goals - 03/22/22 1247   ? ?  ? SLP LONG TERM GOAL #1  ? Title Pt will use multi-modal communication to communitcate basic wants and needs. Met, NEW GOAL Patient  will use multi-modal communication to communitcate complex wants and needs necessary for IADLS   ? Status Revised   ? ?  ?  ? ?  ? ? ? Plan - 04/12/22 2024   ? ? Clinical Impression Statement Pt experienced more difficulty with reading without picture support. He continues to have more islands of intelligible speech. Skilled ST intervention continues to be required to improve pt's receptive language, writing and reading comprehension in an effort to increase pt's functional independence for return to family and leisure activities.   ? Speech Therapy Frequency 2x / week   ? Duration 12 weeks   ? Treatment/Interventions Language facilitation;Cueing hierarchy;Multimodal communcation approach;Compensatory strategies;SLP instruction and feedback;Functional tasks;Patient/family education   ? Potential to Achieve Goals Good   ? Consulted and Agree with Plan of Care Patient;Family member/caregiver   ? Family Member Consulted pt's sister   ? ?  ?  ? ?  ? ? ?Patient will benefit from skilled therapeutic intervention in order to improve the following deficits and impairments:   ?Aphasia ? ?Apraxia ? ?Cerebrovascular accident (CVA) due to thrombosis of left middle cerebral artery ([place]) ? 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 ? ? ?Problem List ?Patient Active Problem List  ? Diagnosis Date Noted  ? 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 Stroke ([place]) 05/07/2021  ? Acute ischemic left MCA stroke ([place]) 05/07/2021  ? 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 ?Myasia Sinatra B. Rutherford Nail, M.S., [name], CBIS ?Speech-Language Pathologist ?Rehabilitation Services ?Office [phone] ? ?[place], [place] ?04/12/2022, 8:28 PM ? ?[facility] ?[facility] ?[place], [place], [address] ?Phone: [phone]   Fax:  [phone] ? ? ?Name: Peter Lucas ?MRN: [id number] ?Date of Birth: Jun 20, 1966 ? ?

## 2022-04-17 ENCOUNTER — Ambulatory Visit: Payer: BC Managed Care – PPO | Admitting: Speech Pathology

## 2022-04-17 DIAGNOSIS — I639 Cerebral infarction, unspecified: Secondary | ICD-10-CM

## 2022-04-17 DIAGNOSIS — R4701 Aphasia: Secondary | ICD-10-CM

## 2022-04-17 DIAGNOSIS — R482 Apraxia: Secondary | ICD-10-CM

## 2022-04-17 DIAGNOSIS — I63312 Cerebral infarction due to thrombosis of left middle cerebral artery: Secondary | ICD-10-CM

## 2022-04-17 NOTE — Patient Instructions (Signed)
Practice using touch screen keyboard to type his name ?

## 2022-04-18 NOTE — Therapy (Signed)
[place] ?[facility] MAIN REHAB SERVICES ?[address] ?[place], [place], [address] ?Phone: [phone]   Fax:  [phone] ? ?Speech Language Pathology Treatment ?RE-CERTIFICATION REQUEST ?Patient Details  ?Name: Peter Lucas ?MRN: [id number] ?Date of Birth: 12/23/1965 ?Referring Provider (SLP): Andrew Kirsteins ? ? ?Encounter Date: 04/17/2022 ? ? End of Session - 04/17/22 1556   ? ? Visit Number 67   ? Number of Visits 91   ? Date for SLP Re-Evaluation 07/10/22   ? Authorization Type [employer]   ? Authorization Time Period 04/17/2022 thru 07/10/2022 - 60 visits calendar year (visit 38)   ? Authorization - Visit Number 7   ? Progress Note Due on Visit 10   ? SLP Start Time 0900   ? SLP Stop Time  1000   ? SLP Time Calculation (min) 60 min   ? Activity Tolerance Patient tolerated treatment well   ? ?  ?  ? ?  ? ? ?Past Medical History:  ?Diagnosis Date  ? DM (diabetes mellitus) (HCC)   ? Hyperlipidemia   ? Hypertension   ? ? ?Past Surgical History:  ?Procedure Laterality Date  ? BACK SURGERY    ? cyst removal  ? IR CT HEAD LTD  05/07/2021  ? IR PERCUTANEOUS ART THROMBECTOMY/INFUSION INTRACRANIAL INC DIAG ANGIO  05/07/2021  ? RADIOLOGY WITH ANESTHESIA N/A 05/07/2021  ? Procedure: IR WITH ANESTHESIA;  Surgeon: Radiologist, Medication, MD;  Location: [facility] OR;  Service: Radiology;  Laterality: N/A;  ? ? ?There were no vitals filed for this visit. ? ? Subjective Assessment - 04/17/22 1555   ? ? Subjective "Happy Mother's Day"   ? Patient is accompained by: Family member   ? Currently in Pain? No/denies   ? ?  ?  ? ?  ? ? ? ? ? ? ? ? ADULT SLP TREATMENT - 04/18/22 0001   ? ?  ? Treatment Provided  ? Treatment provided Cognitive-Linquistic   ?  ? Cognitive-Linquistic Treatment  ? Treatment focused on Aphasia;Apraxia;Patient/family/caregiver education   ? Skilled Treatment Skilled treatment session focused on pt's language impairments, specifically his reading and writing deficits.      ? ?SLP  facilitated session by providing the following interventions:         ? ?Written language using keyboard: pt communicated that when filling out forms on the computer, he has trouble identifying letters on keyboard as all letters are capital letters, SLP added touchscreen keyboard on his laptop, with moderate assistance, pt able to use stylus to write his name with 50% accuracy     ? ?Speech Intelligibility: maximal assistance to say age (56); helpful to have "49  6" written on paper to facilitate intelligibility   ? ?  ?  ? ?  ? ? ? SLP Education - 04/17/22 1555   ? ? Education Details touch screen keyboard, speech intelligibility strategies for stating his age   ? Person(s) Educated Patient;Other (comment)   sister  ? Methods Explanation;Demonstration   ? Comprehension Verbalized understanding   ? ?  ?  ? ?  ? ? ? SLP Short Term Goals - 04/18/22 0841   ? ?  ? SLP SHORT TERM GOAL #1  ? Title The pt will answer simple biographical and orientation yes/no questions presented auditorily at 80% accuracy given minimal multimodal cues.   ? Time 10   ? Period --   sessions  ? Status On-going   ?  ? SLP SHORT TERM GOAL #  2  ? Title Pt will improve speech approximation of functional language at the sentence level during 50% of conversation. MET, NEW GOAL during 80% of conversation   ? Time 10   ? Period --   sessions  ? Status On-going   ?  ? SLP SHORT TERM GOAL #3  ? Title The pt will identify the correct word given 2 choices at 80% accuracy given frequent visual cues in order to comprehend forms for disability, doctor's appts.   ? Baseline goal met, revised to reflect pt progress   ? Time 10   ? Status On-going   ?  ? SLP SHORT TERM GOAL #4  ? Title Pt will enter appropriate information into online forms using touch screen keyboard with 60% accuracy.   ? Baseline goal met, revised to reflect progress   ? Time 10   ? Period --   sessions  ? Status Revised   ?  ? SLP SHORT TERM GOAL #5  ? Title Pt will write appropriate  information on paper forms with 50% accuracy.   ? Baseline goal not met during reporting period, revised to be more functional for pt's goal independently filling out forms related to disability   ? Time 10   ? Period --   sessions  ? Status Revised   ? ?  ?  ? ?  ? ? ? SLP Long Term Goals - 04/18/22 0846   ? ?  ? SLP LONG TERM GOAL #1  ? Title Pt will use multi-modal communication to communitcate basic wants and needs. Met, NEW GOAL Patient will use multi-modal communication to communitcate complex wants and needs necessary for IADLS   ? Baseline moderate impairment, improved from severe impairment   ? Time 12   ? Period Weeks   ? Status Revised   ? Target Date 07/11/22   ?  ? SLP LONG TERM GOAL #2  ? Title Pt will write/type biographical information with 80% accuracy to gain independence with completing forms related to appts, disability, financial.   ? Baseline new goal   ? Time 12   ? Period Weeks   ? Status New   ? Target Date 07/11/22   ? ?  ?  ? ?  ? ? ? Plan - 04/17/22 1559   ? ? Clinical Impression Statement Pt continues to make progress towards goals of increased functional independence. His receptive language abilities are much improved with Mod I use of compensatory strategies such as asking communication partners to write down information. His expressive communication is improved as a result of improved understanding. He continues to have difficulty with self-awareness, reading and writing. 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 Will request re-certification for continued therapy.   ? Speech Therapy Frequency 2x / week   ? Duration 12 weeks   ? Treatment/Interventions Language facilitation;Cueing hierarchy;Multimodal communcation approach;Compensatory strategies;SLP instruction and feedback;Functional tasks;Patient/family education   ? Potential to Achieve Goals Good   ? Potential Considerations Severity of  impairments;Family/community support;Ability to learn/carryover information;Cooperation/participation level;Previous level of function   ? SLP Home Exercise Plan provided, see pt instructions section   ? Consulted and Agree with Plan of Care Patient;Family member/caregiver   ? Family Member Consulted pt's sister   ? ?  ?  ? ?  ? ? ?Patient will benefit from skilled therapeutic intervention in order to improve the following deficits and impairments:   ?Aphasia ? ?  Apraxia ? ?Cerebrovascular accident (CVA) due to thrombosis of left middle cerebral artery ([place]) ? 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 ? ? ?Problem List ?Patient Active Problem List  ? Diagnosis Date Noted  ? 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 Stroke ([place]) 05/07/2021  ? Acute ischemic left MCA stroke ([place]) 05/07/2021  ? 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 ?Agustus Mane B. Rutherford Nail, M.S., [name], CBIS ?Speech-Language Pathologist ?Rehabilitation Services ?Office [phone] ? ?[place], [place] ?04/18/2022, 8:56 AM ? ?[place] ?[facility] ?[place], [place], [address] ?Phone: [phone]   Fax:  [phone] ? ? ?Name: Peter Lucas ?MRN: [id number] ?Date of Birth: 12-15-1965 ? ?

## 2022-04-19 ENCOUNTER — Ambulatory Visit: Payer: BC Managed Care – PPO | Admitting: Speech Pathology

## 2022-04-19 DIAGNOSIS — I63312 Cerebral infarction due to thrombosis of left middle cerebral artery: Secondary | ICD-10-CM

## 2022-04-19 DIAGNOSIS — R4701 Aphasia: Secondary | ICD-10-CM

## 2022-04-19 DIAGNOSIS — R482 Apraxia: Secondary | ICD-10-CM

## 2022-04-19 DIAGNOSIS — I639 Cerebral infarction, unspecified: Secondary | ICD-10-CM

## 2022-04-21 NOTE — Therapy (Signed)
[place] MAIN [facility] SERVICES [address] [place], [place], [address] Phone: [phone]   Fax:  [phone]  Speech Language Pathology Treatment  Patient Details  Name: Peter Lucas MRN: [id number] Date of Birth: 09-14-1966 Referring Provider (SLP): Alysia Penna   Encounter Date: 04/19/2022   End of Session - 04/21/22 0819     Visit Number 10    Number of Visits 50    Date for SLP Re-Evaluation 07/10/22    Authorization Type [employer]    Authorization Time Period 04/17/2022 thru 07/10/2022 - 60 visits calendar year    Authorization - Visit Number 39    Authorization - Number of Visits 60    Progress Note Due on Visit 70    SLP Start Time 0900    SLP Stop Time  1000    SLP Time Calculation (min) 60 min    Activity Tolerance Patient tolerated treatment well             Past Medical History:  Diagnosis Date   DM (diabetes mellitus) ([place])    Hyperlipidemia    Hypertension     Past Surgical History:  Procedure Laterality Date   BACK SURGERY     cyst removal   IR CT HEAD LTD  05/07/2021   IR PERCUTANEOUS ART THROMBECTOMY/INFUSION INTRACRANIAL INC DIAG ANGIO  05/07/2021   RADIOLOGY WITH ANESTHESIA N/A 05/07/2021   Procedure: IR WITH ANESTHESIA;  Surgeon: Radiologist, Medication, MD;  Location: [place];  Service: Radiology;  Laterality: N/A;    There were no vitals filed for this visit.   Subjective Assessment - 04/21/22 0817     Subjective Pt pleasant, eager, motivated    Patient is accompained by: Family member    Currently in Pain? No/denies                   ADULT SLP TREATMENT - 04/21/22 0001       Treatment Provided   Treatment provided Cognitive-Linquistic      Cognitive-Linquistic Treatment   Treatment focused on Aphasia;Apraxia;Patient/family/caregiver education    Skilled Treatment Skilled treatment session focused on pt's language impairments, specifically his reading comprehension and  spelling deficits.       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[place])   Pt able to comprehend written information to fill-in form in 4 of 5 with physical cues to scan to right of form to locate more boxes to complete      Written language using keyboard:   Spelling requested information such as his name, address, phone number - pt with 28% accuracy improving to 90% with written information provided              SLP Education - 04/21/22 0818     Education Details modalities to locate information such as his driver's license    Person(s) Educated Patient;Other (comment)   sister   Methods Explanation;Demonstration;Verbal cues;Tactile cues    Comprehension Verbalized understanding              SLP Short Term Goals - 04/18/22 0841       SLP SHORT TERM GOAL #1   Title The pt will answer simple biographical and orientation yes/no questions presented auditorily at 80% accuracy given minimal multimodal cues.    Time  10    Period --   sessions   Status On-going      SLP SHORT TERM GOAL #2   Title Pt will improve speech approximation of functional language at the sentence level during 50% of conversation. MET, NEW GOAL during 80% of conversation    Time 10    Period --   sessions   Status On-going      SLP SHORT TERM GOAL #3   Title The pt will identify the correct word given 2 choices at 80% accuracy given frequent visual cues in order to comprehend forms for disability, doctor's appts.    Baseline goal met, revised to reflect pt progress    Time 10    Status On-going      SLP SHORT TERM GOAL #4   Title Pt will enter appropriate information into online forms using touch screen keyboard with 60% accuracy.    Baseline goal met, revised  to reflect progress    Time 10    Period --   sessions   Status Revised      SLP SHORT TERM GOAL #5   Title Pt will write appropriate information on paper forms with 50% accuracy.    Baseline goal not met during reporting period, revised to be more functional for pt's goal independently filling out forms related to disability    Time 10    Period --   sessions   Status Revised              SLP Long Term Goals - 04/18/22 0846       SLP LONG TERM GOAL #1   Title Pt will use multi-modal communication to communitcate basic wants and needs. Met, NEW GOAL Patient will use multi-modal communication to communitcate complex wants and needs necessary for IADLS    Baseline moderate impairment, improved from severe impairment    Time 12    Period Weeks    Status Revised    Target Date 07/11/22      SLP LONG TERM GOAL #2   Title Pt will write/type biographical information with 80% accuracy to gain independence with completing forms related to appts, disability, financial.    Baseline new goal    Time 12    Period Weeks    Status New    Target Date 07/11/22              Plan - 04/21/22 0819     Clinical Impression Statement Althought pt had moderate to severe difficulty filling in online forms, he enjoyed the challenge and will continue to benefit from skilled ST services to advance these skills in an effort to advance his functional independence with providing information.    Speech Therapy Frequency 2x / week    Duration 12 weeks    Treatment/Interventions Language facilitation;Cueing hierarchy;Multimodal communcation approach;Compensatory strategies;SLP instruction and feedback;Functional tasks;Patient/family education    Potential to Achieve Goals Good    Potential Considerations Severity of impairments;Family/community support;Ability to learn/carryover information;Cooperation/participation level;Previous level of function    Consulted and Agree with Plan of Care  Patient;Family member/caregiver    Family Member Consulted pt's sister             Patient will benefit from skilled therapeutic intervention in order to improve the following deficits and impairments:   Aphasia  Apraxia  Cerebrovascular accident (CVA) due to thrombosis of left middle cerebral artery ([place])  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 Rutherford Nail M.S., [name], [facility] Pathologist Rehabilitation Services Office [phone], [place] 04/21/2022, 8:22 AM  [place] MAIN [facility] SERVICES [address] [place], [place], [address] Phone: [phone]   Fax:  [phone]   Name: JERRET MCBANE MRN: [id number] Date of Birth: 1965/12/21

## 2022-04-24 ENCOUNTER — Ambulatory Visit: Payer: BC Managed Care – PPO | Admitting: Speech Pathology

## 2022-04-24 DIAGNOSIS — I639 Cerebral infarction, unspecified: Secondary | ICD-10-CM

## 2022-04-24 DIAGNOSIS — R4701 Aphasia: Secondary | ICD-10-CM | POA: Diagnosis not present

## 2022-04-24 DIAGNOSIS — I63312 Cerebral infarction due to thrombosis of left middle cerebral artery: Secondary | ICD-10-CM

## 2022-04-24 DIAGNOSIS — R482 Apraxia: Secondary | ICD-10-CM

## 2022-04-26 ENCOUNTER — Ambulatory Visit: Payer: BC Managed Care – PPO | Admitting: Speech Pathology

## 2022-04-26 DIAGNOSIS — R482 Apraxia: Secondary | ICD-10-CM

## 2022-04-26 DIAGNOSIS — R4701 Aphasia: Secondary | ICD-10-CM | POA: Diagnosis not present

## 2022-04-26 DIAGNOSIS — I639 Cerebral infarction, unspecified: Secondary | ICD-10-CM

## 2022-04-26 DIAGNOSIS — I6602 Occlusion and stenosis of left middle cerebral artery: Secondary | ICD-10-CM

## 2022-04-26 NOTE — Therapy (Addendum)
[place] MAIN [facility] SERVICES [address] [place], [place], [address] Phone: [phone]   Fax:  [phone]  Speech Language Pathology Treatment  Patient Details  Name: Peter Lucas MRN: [id number] Date of Birth: Nov 15, 1966 Referring Provider (SLP): Alysia Penna   Encounter Date: 04/24/2022   End of Session - 04/26/22 0924     Visit Number 61    Number of Visits 44    Date for SLP Re-Evaluation 07/10/22    Authorization Type [employer]    Authorization Time Period 04/17/2022 thru 07/10/2022 - 60 visits calendar year    Authorization - Visit Number 53    Authorization - Number of Visits 60    Progress Note Due on Visit 1    SLP Start Time 0900    SLP Stop Time  1000    SLP Time Calculation (min) 60 min    Activity Tolerance Patient tolerated treatment well             Past Medical History:  Diagnosis Date   DM (diabetes mellitus) ([place])    Hyperlipidemia    Hypertension     Past Surgical History:  Procedure Laterality Date   BACK SURGERY     cyst removal   IR CT HEAD LTD  05/07/2021   IR PERCUTANEOUS ART THROMBECTOMY/INFUSION INTRACRANIAL INC DIAG ANGIO  05/07/2021   RADIOLOGY WITH ANESTHESIA N/A 05/07/2021   Procedure: IR WITH ANESTHESIA;  Surgeon: Radiologist, Medication, MD;  Location: [place];  Service: Radiology;  Laterality: N/A;    There were no vitals filed for this visit.   Subjective Assessment - 04/26/22 0922     Subjective Pt pleasant, eager, motivated "how did the game go?"    Patient is accompained by: Family member    Currently in Pain? No/denies                   ADULT SLP TREATMENT - 04/26/22 0001       Treatment Provided   Treatment provided Cognitive-Linquistic      Cognitive-Linquistic Treatment   Treatment focused on Aphasia;Apraxia;Patient/family/caregiver education    Skilled Treatment Skilled treatment session focused on pt's language impairments, specifically his  reading comprehension and spelling deficits.       SLP facilitated session by providing the following interventions:          Reading Comprehension: Utilized online forms ([email or web address]) for functional practice, specifically had pt complete Citizenship  comprehend written information to fill-in form with accuracy of 7/9 for reading written words      Written language using keyboard: Spelling requested information such as his name, address, phone number - pt with 90% with Mod I use of written information from his license - time to complete spelling was significantly extended d/t spelling error that pt independently self-corrected   Rationale for Evaluation and Treatment Rehabilitation              SLP Education - 04/26/22 0923     Education Details improvement with self-monitoring and self-correcting    Person(s) Educated Patient;Other (comment)   pt's sister   Methods Explanation;Demonstration;Verbal cues;Tactile cues    Comprehension Verbalized understanding;Returned demonstration;Need further instruction              SLP Short Term Goals - 04/18/22 0841       SLP SHORT TERM GOAL #1   Title The pt will answer simple biographical and orientation yes/no questions presented auditorily at 80% accuracy given minimal multimodal cues.  Time 10    Period --   sessions   Status On-going      SLP SHORT TERM GOAL #2   Title Pt will improve speech approximation of functional language at the sentence level during 50% of conversation. MET, NEW GOAL during 80% of conversation    Time 10    Period --   sessions   Status On-going      SLP SHORT TERM GOAL #3   Title The pt will identify the correct word given 2 choices at 80% accuracy given frequent visual cues in order to comprehend forms for disability, doctor's appts.    Baseline goal met, revised to reflect pt progress    Time 10    Status On-going      SLP SHORT TERM GOAL #4   Title Pt will enter  appropriate information into online forms using touch screen keyboard with 60% accuracy.    Baseline goal met, revised to reflect progress    Time 10    Period --   sessions   Status Revised      SLP SHORT TERM GOAL #5   Title Pt will write appropriate information on paper forms with 50% accuracy.    Baseline goal not met during reporting period, revised to be more functional for pt's goal independently filling out forms related to disability    Time 10    Period --   sessions   Status Revised              SLP Long Term Goals - 04/18/22 0846       SLP LONG TERM GOAL #1   Title Pt will use multi-modal communication to communitcate basic wants and needs. Met, NEW GOAL Patient will use multi-modal communication to communitcate complex wants and needs necessary for IADLS    Baseline moderate impairment, improved from severe impairment    Time 12    Period Weeks    Status Revised    Target Date 07/11/22      SLP LONG TERM GOAL #2   Title Pt will write/type biographical information with 80% accuracy to gain independence with completing forms related to appts, disability, financial.    Baseline new goal    Time 12    Period Weeks    Status New    Target Date 07/11/22              Plan - 04/26/22 1194     Clinical Impression Statement Pt with improved ability to self-monitor and self-correct, when completing online form. He continues to have difficulty understanding what information is being requested and with spelling. Will continue to benefit from skilled ST services to advance these skills in an effort to advance his functional independence with providing information.    Speech Therapy Frequency 2x / week    Duration 12 weeks    Treatment/Interventions Language facilitation;Cueing hierarchy;Multimodal communcation approach;Compensatory strategies;SLP instruction and feedback;Functional tasks;Patient/family education    Potential to Achieve Goals Good    Potential  Considerations Severity of impairments;Family/community support;Ability to learn/carryover information;Cooperation/participation level;Previous level of function    SLP Home Exercise Plan provided, see pt instructions section    Consulted and Agree with Plan of Care Patient;Family member/caregiver    Family Member Consulted pt's sister             Patient will benefit from skilled therapeutic intervention in order to improve the following deficits and impairments:   Aphasia  Apraxia  Cerebrovascular accident (CVA) due to  thrombosis of left middle cerebral artery (HCC)  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 Rutherford Nail M.S., [name], [facility] Pathologist Rehabilitation Services Office [phone], [place] 04/26/2022, 9:26 AM  [place] MAIN [facility] SERVICES [address] [name], [place], [address] Phone: [phone]   Fax:  [phone]   Name: ALFONS SULKOWSKI MRN: [id number] Date of Birth: 14-Mar-1966

## 2022-04-27 NOTE — Patient Instructions (Signed)
Use forms to practice writing biographical information

## 2022-04-27 NOTE — Therapy (Signed)
[place] MAIN [facility] SERVICES [address] [place], [place], [address] Phone: [phone]   Fax:  [phone]  Speech Language Pathology Treatment 10th visit progress note    Patient Details  Name: Peter Lucas MRN: [id number] Date of Birth: 07-03-1966 Referring Provider (SLP): Alysia Penna   Encounter Date: 04/26/2022  Speech Therapy Progress Note  Dates of Reporting Period:  03/22/2022 to 04/26/2022  Objective: Patient has been seen for 10 speech therapy sessions this reporting period targeting aphasia, cognitive communication, and apraxia of speech. Patient is making progress toward LTGs and met 4/5 STGs this reporting period. See skilled intervention, clinical impressions, and goals below for details.   End of Session - 04/27/22 0712     Visit Number 70    Number of Visits 31    Date for SLP Re-Evaluation 07/10/22    Authorization Type [employer]    Authorization Time Period 04/17/2022 thru 07/10/2022 - 60 visits calendar year    Authorization - Visit Number 22    Authorization - Number of Visits 60    Progress Note Due on Visit 41    SLP Start Time 0900    SLP Stop Time  1000    SLP Time Calculation (min) 60 min    Activity Tolerance Patient tolerated treatment well             Past Medical History:  Diagnosis Date   DM (diabetes mellitus) ([place])    Hyperlipidemia    Hypertension     Past Surgical History:  Procedure Laterality Date   BACK SURGERY     cyst removal   IR CT HEAD LTD  05/07/2021   IR PERCUTANEOUS ART THROMBECTOMY/INFUSION INTRACRANIAL INC DIAG ANGIO  05/07/2021   RADIOLOGY WITH ANESTHESIA N/A 05/07/2021   Procedure: IR WITH ANESTHESIA;  Surgeon: Radiologist, Medication, MD;  Location: [place];  Service: Radiology;  Laterality: N/A;    There were no vitals filed for this visit.   Subjective Assessment - 04/27/22 0708     Subjective Pt pleasant, eager, showed picture of fish that he had  caught    Patient is accompained by: Family member    Currently in Pain? No/denies                   ADULT SLP TREATMENT - 04/27/22 0001       Treatment Provided   Treatment provided Cognitive-Linquistic      Cognitive-Linquistic Treatment   Treatment focused on Aphasia;Apraxia;Patient/family/caregiver education    Skilled Treatment Skilled treatment session focused on pt's language impairments, specifically his reading comprehension and spelling deficits.       SLP facilitated session by providing the following interventions:          Reading Comprehension: 5/9 when completing paper forms targeting biographical information; specifically pt required assistance to put correct information in appropriate section, pt needs to look ahead at blanks to have better understanding of what information is being requested      Written language using keyboard: Pt was Mod I for use of compensatory strategies to achieve 100% accuracy with spelling; continues to benefit from increased time to change errors              SLP Education - 04/27/22 0711     Education Details improvement with use of compensatory strategies/external aids; improved self-monitoring and self-correcting    Person(s) Educated Patient;Other (comment)   pt's sister   Methods Explanation;Demonstration;Verbal cues;Handout    Comprehension Verbalized  understanding;Returned demonstration;Need further instruction              SLP Short Term Goals - 04/27/22 2563       SLP SHORT TERM GOAL #1   Title Pt will accurately write/type simple biographical and orientation with 80% accuracy given minimal multimodal cues.    Baseline goal met, revised to reflect pt progress - currentyl moderate cues required    Time 10    Period --   sessions   Status Revised      SLP SHORT TERM GOAL #2   Title Pt will improve speech approximation of functional language at the simple conversation level in 5 of 7 utterances given  minimal verbal cues for correction.    Baseline goal met, revised to reflect pt progress - currently requires moderate cues    Time 10    Period --   sessions   Status Revised      SLP SHORT TERM GOAL #3   Title The pt will identify the correct word given 2 choices at 80% accuracy given frequent visual cues in order to comprehend forms for disability, doctor's appts.    Time 10    Period --   sessions   Status On-going      SLP SHORT TERM GOAL #4   Title Pt will improve reading comprehension by entering appropriate information into online forms with 90% accuracy given supervision cues.    Baseline goal met, revised to reflect progress - currently requires moderate to minimal cues    Time 10    Period --   sessions   Status Revised      SLP SHORT TERM GOAL #5   Title Pt will improve reading comprehension by entering appropriate information into paper forms with 90% accuracy given supervision cues.    Baseline revised to reflect progress, currently requires moderate to minimal cues    Time 10    Period --   sessions   Status Revised              SLP Long Term Goals - 04/27/22 0720       SLP LONG TERM GOAL #1   Title Patient will use multi-modal communication to communitcate complex wants and needs necessary for IADLS    Status On-going    Target Date 07/11/22      SLP LONG TERM GOAL #2   Title Pt will write/type biographical information with 80% accuracy to gain independence with completing forms related to appts, disability, financial.    Status On-going    Target Date 07/11/22              Plan - 04/27/22 0712     Clinical Impression Statement Pt continues to make great progress towards his goals. 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    Speech Therapy Frequency 2x / week    Duration 12 weeks    Treatment/Interventions Language  facilitation;Cueing hierarchy;Multimodal communcation approach;Compensatory strategies;SLP instruction and feedback;Functional tasks;Patient/family education    Potential to Achieve Goals Good    Potential Considerations Severity of impairments;Family/community support;Ability to learn/carryover information;Cooperation/participation level;Previous level of function    SLP Home Exercise Plan provided, see pt instructions section    Consulted and Agree with Plan of Care Patient;Family member/caregiver    Family Member Consulted pt's sister             Patient  will benefit from skilled therapeutic intervention in order to improve the following deficits and impairments:   Aphasia  Apraxia  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 Rutherford Nail M.S., [name], [place] Pathologist Certified Brain Injury [place]  [facility] [phone] Ascom [phone] Fax [phone]  Clarisa Fling 04/27/2022, 7:26 AM  [place] [address] [place], [place], [address] Phone: [phone]   Fax:  [phone]   Name: Peter Lucas MRN: [id number] Date of Birth: 04/01/1966

## 2022-05-03 ENCOUNTER — Ambulatory Visit: Payer: BC Managed Care – PPO | Admitting: Speech Pathology

## 2022-05-03 DIAGNOSIS — I63312 Cerebral infarction due to thrombosis of left middle cerebral artery: Secondary | ICD-10-CM

## 2022-05-03 DIAGNOSIS — R482 Apraxia: Secondary | ICD-10-CM

## 2022-05-03 DIAGNOSIS — R4701 Aphasia: Secondary | ICD-10-CM | POA: Diagnosis not present

## 2022-05-03 NOTE — Patient Instructions (Signed)
Fill in information on paper forms to practice biographical information

## 2022-05-03 NOTE — Therapy (Addendum)
[place] MAIN [facility] SERVICES [address] [place], [place], [address] Phone: [phone]   Fax:  [phone]  Speech Language Pathology Treatment  Patient Details  Name: Peter Lucas MRN: [id number] Date of Birth: June 09, 1966 Referring Provider (SLP): Alysia Penna   Encounter Date: 05/03/2022   End of Session - 05/03/22 2052     Visit Number 22    Number of Visits 75    Date for SLP Re-Evaluation 07/10/22    Authorization Type [employer]    Authorization Time Period 04/17/2022 thru 07/10/2022 - 60 visits calendar year    Authorization - Visit Number 2    Authorization - Number of Visits 60    Progress Note Due on Visit 59    SLP Start Time 0900    SLP Stop Time  1000    SLP Time Calculation (min) 60 min    Activity Tolerance Patient tolerated treatment well             Past Medical History:  Diagnosis Date   DM (diabetes mellitus) ([place])    Hyperlipidemia    Hypertension     Past Surgical History:  Procedure Laterality Date   BACK SURGERY     cyst removal   IR CT HEAD LTD  05/07/2021   IR PERCUTANEOUS ART THROMBECTOMY/INFUSION INTRACRANIAL INC DIAG ANGIO  05/07/2021   RADIOLOGY WITH ANESTHESIA N/A 05/07/2021   Procedure: IR WITH ANESTHESIA;  Surgeon: Radiologist, Medication, MD;  Location: [place];  Service: Radiology;  Laterality: N/A;    There were no vitals filed for this visit.   Subjective Assessment - 05/03/22 2048     Subjective pt pleasant, showing pictures of some duck calls that he made over the weekend    Patient is accompained by: Family member                   ADULT SLP TREATMENT - 05/03/22 0001       Treatment Provided   Treatment provided Cognitive-Linquistic      Cognitive-Linquistic Treatment   Treatment focused on Aphasia;Apraxia;Patient/family/caregiver education    Skilled Treatment Skilled treatment session focused on pt's language impairments.  SLP facilitated session  by providing minimal assistance for reading comprehension of basic form requiring biographical information (name, DOB, phone number and address). Over the weekend, t attempted to return an item at [employer]. He was unable to do so as he was not able to understand employee's questions about name and address. SLP also assisted pt in taking pictures of his license to use to provide information.              SLP Education - 05/03/22 2052     Education Details using license to help when returning items and needing to state his address    Person(s) Educated Patient;Other (comment)   pt's sister   Methods Explanation;Demonstration;Verbal cues    Comprehension Need further instruction;Verbal cues required              SLP Short Term Goals - 04/27/22 0714       SLP SHORT TERM GOAL #1   Title Pt will accurately write/type simple biographical and orientation with 80% accuracy given minimal multimodal cues.    Baseline goal met, revised to reflect pt progress - currentyl moderate cues required    Time 10    Period --   sessions   Status Revised      SLP SHORT TERM GOAL #2   Title  Pt will improve speech approximation of functional language at the simple conversation level in 5 of 7 utterances given minimal verbal cues for correction.    Baseline goal met, revised to reflect pt progress - currently requires moderate cues    Time 10    Period --   sessions   Status Revised      SLP SHORT TERM GOAL #3   Title The pt will identify the correct word given 2 choices at 80% accuracy given frequent visual cues in order to comprehend forms for disability, doctor's appts.    Time 10    Period --   sessions   Status On-going      SLP SHORT TERM GOAL #4   Title Pt will improve reading comprehension by entering appropriate information into online forms with 90% accuracy given supervision cues.    Baseline goal met, revised to reflect progress - currently requires moderate to minimal cues     Time 10    Period --   sessions   Status Revised      SLP SHORT TERM GOAL #5   Title Pt will improve reading comprehension by entering appropriate information into paper forms with 90% accuracy given supervision cues.    Baseline revised to reflect progress, currently requires moderate to minimal cues    Time 10    Period --   sessions   Status Revised              SLP Long Term Goals - 04/27/22 0720       SLP LONG TERM GOAL #1   Title Patient will use multi-modal communication to communitcate complex wants and needs necessary for IADLS    Status On-going    Target Date 07/11/22      SLP LONG TERM GOAL #2   Title Pt will write/type biographical information with 80% accuracy to gain independence with completing forms related to appts, disability, financial.    Status On-going    Target Date 07/11/22              Plan - 05/03/22 2053     Clinical Impression Statement Pt continues to make great progress towards his goals. 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    Speech Therapy Frequency 2x / week    Duration 12 weeks    Treatment/Interventions Language facilitation;Cueing hierarchy;Multimodal communcation approach;Compensatory strategies;SLP instruction and feedback;Functional tasks;Patient/family education    Potential to Achieve Goals Good    Potential Considerations Severity of impairments;Family/community support;Ability to learn/carryover information;Cooperation/participation level;Previous level of function    SLP Home Exercise Plan provided, see pt instructions section    Consulted and Agree with Plan of Care Patient;Family member/caregiver    Family Member Consulted pt's sister             Patient will benefit from skilled therapeutic intervention in order to improve the following deficits and impairments:    Aphasia  Apraxia  Cerebrovascular accident (CVA) due to thrombosis of left middle cerebral artery [facility])    Problem List Patient Active Problem List   Diagnosis Date Noted   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 ([place]) 05/12/2021   Stroke ([place]) 05/07/2021   Acute ischemic left MCA stroke ([place]) 05/07/2021   Middle cerebral artery embolism, left 05/07/2021   Happi B. Rutherford Nail, M.S., [name], CBIS Speech-Language Pathologist Certified Brain Injury Specialist [facility]  [place] Office [phone] Ascom [phone] Fax [phone]  Clarisa Fling 05/03/2022, 8:54 PM  [place] MAIN [facility] SERVICES [address] [place], [place], [address] Phone: [phone]   Fax:  [phone]   Name: Peter Lucas MRN: [id number] Date of Birth: 06-Oct-1966

## 2022-05-08 ENCOUNTER — Ambulatory Visit: Payer: BC Managed Care – PPO | Attending: Physical Medicine & Rehabilitation | Admitting: Speech Pathology

## 2022-05-08 DIAGNOSIS — R482 Apraxia: Secondary | ICD-10-CM | POA: Diagnosis present

## 2022-05-08 DIAGNOSIS — I63312 Cerebral infarction due to thrombosis of left middle cerebral artery: Secondary | ICD-10-CM

## 2022-05-08 DIAGNOSIS — R4701 Aphasia: Secondary | ICD-10-CM | POA: Diagnosis present

## 2022-05-08 DIAGNOSIS — I639 Cerebral infarction, unspecified: Secondary | ICD-10-CM

## 2022-05-10 ENCOUNTER — Ambulatory Visit: Payer: BC Managed Care – PPO | Admitting: Speech Pathology

## 2022-05-10 NOTE — Therapy (Signed)
[place] MAIN [facility] SERVICES [address] [place], [place], [address] Phone: [phone]   Fax:  [phone]  Speech Language Pathology Treatment  Patient Details  Name: Peter Lucas MRN: [id number] Date of Birth: [birth year] Referring Provider (SLP): Alysia Penna   Encounter Date: 05/08/2022   End of Session - 05/10/22 1309     Visit Number 18    Number of Visits 77    Date for SLP Re-Evaluation 07/10/22    Authorization Type [employer]    Authorization Time Period 04/17/2022 thru 07/10/2022 - 60 visits calendar year    Authorization - Visit Number 43    Authorization - Number of Visits 60    Progress Note Due on Visit 40    SLP Start Time 0900    SLP Stop Time  1000    SLP Time Calculation (min) 60 min    Activity Tolerance Patient tolerated treatment well             Past Medical History:  Diagnosis Date   DM (diabetes mellitus) ([place])    Hyperlipidemia    Hypertension     Past Surgical History:  Procedure Laterality Date   BACK SURGERY     cyst removal   IR CT HEAD LTD  05/07/2021   IR PERCUTANEOUS ART THROMBECTOMY/INFUSION INTRACRANIAL INC DIAG ANGIO  05/07/2021   RADIOLOGY WITH ANESTHESIA N/A 05/07/2021   Procedure: IR WITH ANESTHESIA;  Surgeon: Radiologist, Medication, MD;  Location: [place];  Service: Radiology;  Laterality: N/A;    There were no vitals filed for this visit.   Subjective Assessment - 05/10/22 1308     Subjective pt showing pictures of his daughter's recent job    Patient is accompained by: Family member    Currently in Pain? No/denies                   ADULT SLP TREATMENT - 05/10/22 0001       Treatment Provided   Treatment provided Cognitive-Linquistic      Cognitive-Linquistic Treatment   Treatment focused on Aphasia;Apraxia;Patient/family/caregiver education    Skilled Treatment Skilled treatment session focused on pt's language impairments, specifically his reading  comprehension and spelling deficits as well as numbers.       SLP facilitated session by providing the following interventions:          Reading Comprehension: 4/5 when completing paper forms targeting biographical information; specifically pt required assistance to put correct information in appropriate section, pt needs to look ahead at blanks to have better understanding of what information is being requested      Written language: Pt was Mod I for use of compensatory strategies to achieve 100% accuracy with spelling; continues to benefit from increased time to change errors   Speech Intelligibility: SLP facilitated pt's speech intelligibility when stating numbers during card game of WAR. Pt very impulsively stated incorrect numbers with moderate assistance required to "think before speaking." With these cues, his speech intelligibility improved from 50% to 95%.              SLP Education - 05/10/22 1309     Education Details impulsivity    Person(s) Educated Patient;Other (comment)   pt's sister   Methods Explanation;Demonstration;Verbal cues    Comprehension Verbalized understanding;Need further instruction              SLP Short Term Goals - 04/27/22 0714       SLP SHORT TERM GOAL #1  Title Pt will accurately write/type simple biographical and orientation with 80% accuracy given minimal multimodal cues.    Baseline goal met, revised to reflect pt progress - currentyl moderate cues required    Time 10    Period --   sessions   Status Revised      SLP SHORT TERM GOAL #2   Title Pt will improve speech approximation of functional language at the simple conversation level in 5 of 7 utterances given minimal verbal cues for correction.    Baseline goal met, revised to reflect pt progress - currently requires moderate cues    Time 10    Period --   sessions   Status Revised      SLP SHORT TERM GOAL #3   Title The pt will identify the correct word given 2 choices at 80%  accuracy given frequent visual cues in order to comprehend forms for disability, doctor's appts.    Time 10    Period --   sessions   Status On-going      SLP SHORT TERM GOAL #4   Title Pt will improve reading comprehension by entering appropriate information into online forms with 90% accuracy given supervision cues.    Baseline goal met, revised to reflect progress - currently requires moderate to minimal cues    Time 10    Period --   sessions   Status Revised      SLP SHORT TERM GOAL #5   Title Pt will improve reading comprehension by entering appropriate information into paper forms with 90% accuracy given supervision cues.    Baseline revised to reflect progress, currently requires moderate to minimal cues    Time 10    Period --   sessions   Status Revised              SLP Long Term Goals - 04/27/22 0720       SLP LONG TERM GOAL #1   Title Patient will use multi-modal communication to communitcate complex wants and needs necessary for IADLS    Status On-going    Target Date 07/11/22      SLP LONG TERM GOAL #2   Title Pt will write/type biographical information with 80% accuracy to gain independence with completing forms related to appts, disability, financial.    Status On-going    Target Date 07/11/22              Plan - 05/10/22 1310     Clinical Impression Statement Pt continues to make great progress towards his goals. 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    Speech Therapy Frequency 2x / week    Duration 12 weeks    Treatment/Interventions Language facilitation;Cueing hierarchy;Multimodal communcation approach;Compensatory strategies;SLP instruction and feedback;Functional tasks;Patient/family education    Potential to Achieve Goals Good    SLP Home Exercise Plan provided, see pt instructions section    Consulted and Agree  with Plan of Care Patient;Family member/caregiver             Patient will benefit from skilled therapeutic intervention in order to improve the following deficits and impairments:   Aphasia  Apraxia  Cerebrovascular accident (CVA) due to thrombosis of left middle cerebral artery (HCC)  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 Rutherford Nail M.S., [name], [place] Pathologist Certified Brain Injury [place]  [facility] [phone] Ascom [phone] Fax [phone]  [name], [place] 05/10/2022, 1:10 PM  [place] MAIN [facility] SERVICES [address] [place], [place], [address] Phone: [phone]   Fax:  [phone]   Name: Peter Lucas MRN: [id number] Date of Birth: [birth year]

## 2022-05-11 ENCOUNTER — Ambulatory Visit: Payer: BC Managed Care – PPO | Admitting: Speech Pathology

## 2022-05-11 DIAGNOSIS — R482 Apraxia: Secondary | ICD-10-CM

## 2022-05-11 DIAGNOSIS — R4701 Aphasia: Secondary | ICD-10-CM

## 2022-05-11 DIAGNOSIS — I63312 Cerebral infarction due to thrombosis of left middle cerebral artery: Secondary | ICD-10-CM

## 2022-05-11 DIAGNOSIS — I639 Cerebral infarction, unspecified: Secondary | ICD-10-CM

## 2022-05-11 NOTE — Therapy (Signed)
[place] MAIN [facility] SERVICES [address] [place], [place], [address] Phone: [phone]   Fax:  [phone]  Speech Language Pathology Treatment  Patient Details  Name: Peter Lucas MRN: [id number] Date of Birth: January 08, 1966 Referring Provider (SLP): Alysia Penna   Encounter Date: 05/11/2022   End of Session - 05/11/22 2034     Visit Number 75    Number of Visits 42    Date for SLP Re-Evaluation 07/10/22    Authorization Type [employer]    Authorization Time Period 04/17/2022 thru 07/10/2022 - 60 visits calendar year    Authorization - Visit Number 26    Authorization - Number of Visits 60    Progress Note Due on Visit 21    SLP Start Time 1300    SLP Stop Time  1400    SLP Time Calculation (min) 60 min    Activity Tolerance Patient tolerated treatment well             Past Medical History:  Diagnosis Date   DM (diabetes mellitus) ([place])    Hyperlipidemia    Hypertension     Past Surgical History:  Procedure Laterality Date   BACK SURGERY     cyst removal   IR CT HEAD LTD  05/07/2021   IR PERCUTANEOUS ART THROMBECTOMY/INFUSION INTRACRANIAL INC DIAG ANGIO  05/07/2021   RADIOLOGY WITH ANESTHESIA N/A 05/07/2021   Procedure: IR WITH ANESTHESIA;  Surgeon: Radiologist, Medication, MD;  Location: [place];  Service: Radiology;  Laterality: N/A;    There were no vitals filed for this visit.   Subjective Assessment - 05/11/22 2026     Subjective pt pleasant, "so is it tomorrow?" referring to therapist's son's graduation    Patient is accompained by: Family member    Currently in Pain? No/denies                   ADULT SLP TREATMENT - 05/11/22 0001       Treatment Provided   Treatment provided Cognitive-Linquistic      Cognitive-Linquistic Treatment   Treatment focused on Aphasia;Apraxia;Patient/family/caregiver education    Skilled Treatment Skilled ST intervention focused on pt's speech intelligibility  and receptive language abilities during context based conversation. Pt with markedly improved speech intelligibility at the sentence level during conversational turn taking. In addition pt with improved receptive language requiring only repetitive verbal cues.              SLP Education - 05/11/22 2033     Education Details speech intelligibility    Person(s) Educated Patient;Other (comment)   pt's sister   Methods Explanation;Demonstration;Verbal cues    Comprehension Verbalized understanding;Need further instruction              SLP Short Term Goals - 04/27/22 0714       SLP SHORT TERM GOAL #1   Title Pt will accurately write/type simple biographical and orientation with 80% accuracy given minimal multimodal cues.    Baseline goal met, revised to reflect pt progress - currentyl moderate cues required    Time 10    Period --   sessions   Status Revised      SLP SHORT TERM GOAL #2   Title Pt will improve speech approximation of functional language at the simple conversation level in 5 of 7 utterances given minimal verbal cues for correction.    Baseline goal met, revised to reflect pt progress - currently requires moderate cues  Time 10    Period --   sessions   Status Revised      SLP SHORT TERM GOAL #3   Title The pt will identify the correct word given 2 choices at 80% accuracy given frequent visual cues in order to comprehend forms for disability, doctor's appts.    Time 10    Period --   sessions   Status On-going      SLP SHORT TERM GOAL #4   Title Pt will improve reading comprehension by entering appropriate information into online forms with 90% accuracy given supervision cues.    Baseline goal met, revised to reflect progress - currently requires moderate to minimal cues    Time 10    Period --   sessions   Status Revised      SLP SHORT TERM GOAL #5   Title Pt will improve reading comprehension by entering appropriate information into paper forms with  90% accuracy given supervision cues.    Baseline revised to reflect progress, currently requires moderate to minimal cues    Time 10    Period --   sessions   Status Revised              SLP Long Term Goals - 04/27/22 0720       SLP LONG TERM GOAL #1   Title Patient will use multi-modal communication to communitcate complex wants and needs necessary for IADLS    Status On-going    Target Date 07/11/22      SLP LONG TERM GOAL #2   Title Pt will write/type biographical information with 80% accuracy to gain independence with completing forms related to appts, disability, financial.    Status On-going    Target Date 07/11/22              Plan - 05/11/22 2035     Clinical Impression Statement Pt continues to make great progress towards his goals. 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    Speech Therapy Frequency 2x / week    Duration 12 weeks    Treatment/Interventions Language facilitation;Cueing hierarchy;Multimodal communcation approach;Compensatory strategies;SLP instruction and feedback;Functional tasks;Patient/family education    Potential to Achieve Goals Good    SLP Home Exercise Plan provided, see pt instructions section    Consulted and Agree with Plan of Care Patient;Family member/caregiver    Family Member Consulted pt's sister             Patient will benefit from skilled therapeutic intervention in order to improve the following deficits and impairments:   Apraxia  Aphasia  Cerebrovascular accident (CVA) due to thrombosis of left middle cerebral artery ([place])  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 Rutherford Nail M.S., [name], [place] Pathologist Certified Brain Injury [place]  [facility] [phone] Ascom [phone] Fax [phone]  [name], Layne Benton 05/11/2022, 8:36 PM  [place] MAIN [facility] SERVICES [address] [place], [place], [address] Phone: [phone]   Fax:  [phone]   Name: Peter Lucas MRN: [id number] Date  of Birth: December 21, 1965

## 2022-05-15 ENCOUNTER — Ambulatory Visit: Payer: BC Managed Care – PPO | Admitting: Speech Pathology

## 2022-05-15 DIAGNOSIS — I639 Cerebral infarction, unspecified: Secondary | ICD-10-CM

## 2022-05-15 DIAGNOSIS — R4701 Aphasia: Secondary | ICD-10-CM | POA: Diagnosis not present

## 2022-05-15 DIAGNOSIS — R482 Apraxia: Secondary | ICD-10-CM

## 2022-05-15 DIAGNOSIS — I63312 Cerebral infarction due to thrombosis of left middle cerebral artery: Secondary | ICD-10-CM

## 2022-05-17 ENCOUNTER — Ambulatory Visit: Payer: BC Managed Care – PPO | Admitting: Speech Pathology

## 2022-05-17 DIAGNOSIS — R4701 Aphasia: Secondary | ICD-10-CM | POA: Diagnosis not present

## 2022-05-17 DIAGNOSIS — I639 Cerebral infarction, unspecified: Secondary | ICD-10-CM

## 2022-05-17 DIAGNOSIS — I63312 Cerebral infarction due to thrombosis of left middle cerebral artery: Secondary | ICD-10-CM

## 2022-05-17 DIAGNOSIS — R482 Apraxia: Secondary | ICD-10-CM

## 2022-05-17 NOTE — Therapy (Signed)
[place] MAIN [facility] SERVICES [address] [place], [place], [address] Phone: [phone]   Fax:  [phone]  Speech Language Pathology Treatment  Patient Details  Name: Peter Lucas MRN: [id number] Date of Birth: June 06, 1966 Referring Provider (SLP): Alysia Penna   Encounter Date: 05/15/2022   End of Session - 05/17/22 0737     Visit Number 64    Number of Visits 9    Date for SLP Re-Evaluation 07/10/22    Authorization Type [employer]    Authorization Time Period 04/17/2022 thru 07/10/2022 - 60 visits calendar year    Authorization - Visit Number 86    Authorization - Number of Visits 60    Progress Note Due on Visit 18    SLP Start Time 0900    SLP Stop Time  1000    SLP Time Calculation (min) 60 min    Activity Tolerance Patient tolerated treatment well             Past Medical History:  Diagnosis Date   DM (diabetes mellitus) ([place])    Hyperlipidemia    Hypertension     Past Surgical History:  Procedure Laterality Date   BACK SURGERY     cyst removal   IR CT HEAD LTD  05/07/2021   IR PERCUTANEOUS ART THROMBECTOMY/INFUSION INTRACRANIAL INC DIAG ANGIO  05/07/2021   RADIOLOGY WITH ANESTHESIA N/A 05/07/2021   Procedure: IR WITH ANESTHESIA;  Surgeon: Radiologist, Medication, MD;  Location: [place];  Service: Radiology;  Laterality: N/A;    There were no vitals filed for this visit.   Subjective Assessment - 05/17/22 0729     Subjective "How was it?' referring to SLP's son's graduation    Patient is accompained by: Family member    Currently in Pain? No/denies                   ADULT SLP TREATMENT - 05/17/22 0001       Treatment Provided   Treatment provided Cognitive-Linquistic      Cognitive-Linquistic Treatment   Treatment focused on Aphasia;Apraxia;Patient/family/caregiver education    Skilled Treatment Skilled treatment session focused on pt's language impairments, specifically auditory  comprehension       SLP facilitated session by providing the following interventions:          Constant Therapy Clinician App utilized:   Pick the letters to match the sounds:   Level 1: 80%   Level 2: 80%   Level 3: 80%   Level 4: 90%    Follow instructions you hear:   Level 1: with maximal multimodal assistance, pt able to achieve 50% accuracy - pt unable to comprehend and identify directional words (right, left, above, below)              SLP Education - 05/17/22 0736     Education Details directional words    Person(s) Educated Patient;Other (comment)   pt's sister   Methods Explanation;Demonstration;Verbal cues    Comprehension Verbalized understanding;Need further instruction              SLP Short Term Goals - 05/17/22 0738       SLP SHORT TERM GOAL #5   Title Pt will identify right vs left in 8 out of 10 opportunities.    Baseline unable    Time 10    Period --   sessions   Status New  SLP Long Term Goals - 04/27/22 0720       SLP LONG TERM GOAL #1   Title Patient will use multi-modal communication to communitcate complex wants and needs necessary for IADLS    Status On-going    Target Date 07/11/22      SLP LONG TERM GOAL #2   Title Pt will write/type biographical information with 80% accuracy to gain independence with completing forms related to appts, disability, financial.    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 Pt states, "I can't hear it. I mean I hear it but makes no sense." More specifically, pt struggled with directional words that would be beneficial in potentially describing pain such as "right knee, left knee." As such, continued skilled ST intervention continues to be indicated to increase functional independence and reduce caregiver burden.    Speech Therapy Frequency 2x / week     Duration 12 weeks    Treatment/Interventions Language facilitation;Cueing hierarchy;Multimodal communcation approach;Compensatory strategies;SLP instruction and feedback;Functional tasks;Patient/family education    Potential to Achieve Goals Good    Consulted and Agree with Plan of Care Patient;Family member/caregiver    Family Member Consulted pt's sister             Patient will benefit from skilled therapeutic intervention in order to improve the following deficits and impairments:   Aphasia  Apraxia  Cerebrovascular accident (CVA) due to thrombosis of left middle cerebral artery ([place])  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 Rutherford Nail M.S., [name], [place] Pathologist Certified Brain Injury [place]  [facility] [phone] Ascom [phone] Fax [phone]  [name], Layne Benton 05/17/2022, 7:39 AM  [place] [address] [place] [address] [place], [place], [address] Phone: [phone]   Fax:  [phone]   Name: Peter Lucas MRN: [id number] Date of Birth: 01-16-1966

## 2022-05-18 NOTE — Therapy (Signed)
[place] [facility] MAIN [facility] SERVICES [address] [place], [place], [address] Phone: [phone]   Fax:  [phone]  Speech Language Pathology Treatment  Patient Details  Name: Peter Lucas MRN: [id number] Date of Birth: 1966-02-06 Referring Provider (SLP): Claudette Laws   Encounter Date: 05/17/2022   End of Session - 05/18/22 1409     Visit Number 75    Number of Visits 91    Date for SLP Re-Evaluation 07/10/22    Authorization Type [employer]    Authorization Time Period 04/17/2022 thru 07/10/2022 - 60 visits calendar year    Authorization - Visit Number 46    Authorization - Number of Visits 60    Progress Note Due on Visit 80    SLP Start Time 0900    SLP Stop Time  1000    SLP Time Calculation (min) 60 min    Activity Tolerance Patient tolerated treatment well             Past Medical History:  Diagnosis Date   DM (diabetes mellitus) ([facility])    Hyperlipidemia    Hypertension     Past Surgical History:  Procedure Laterality Date   BACK SURGERY     cyst removal   IR CT HEAD LTD  05/07/2021   IR PERCUTANEOUS ART THROMBECTOMY/INFUSION INTRACRANIAL INC DIAG ANGIO  05/07/2021   RADIOLOGY WITH ANESTHESIA N/A 05/07/2021   Procedure: IR WITH ANESTHESIA;  Surgeon: Radiologist, Medication, MD;  [facility];  Service: Radiology;  Laterality: N/A;    There were no vitals filed for this visit.   Subjective Assessment - 05/18/22 1408     Subjective pt pleasant, eager    Patient is accompained by: Family member    Currently in Pain? No/denies                   ADULT SLP TREATMENT - 05/18/22 0001       Treatment Provided   Treatment provided Cognitive-Linquistic      Cognitive-Linquistic Treatment   Treatment focused on Aphasia;Apraxia;Patient/family/caregiver education    Skilled Treatment Skilled treatment session focused on pt's language impairments, specifically auditory comprehension     SLP  facilitated session by providing the following interventions:          Receptive Language:   Pt able to identify basic body parts in field of 3 with 63% improving to 100% with extra time and verbal repetition      Speech Intelligibility:  When labeling the body parts, pt demonstrated good speech intelligibility in 13 of 16 opportunities      Right vs Left: concern for visuospatial deficits and pt continued to placed items at a diagonal instead of "to the right" or "to the left" - total multimodal assistance faded to maximal assistance needed.              SLP Education - 05/18/22 1409     Education Details directional words    Person(s) Educated Patient;Other (comment)   pt's sister   Methods Explanation;Demonstration;Verbal cues    Comprehension Verbalized understanding;Need further instruction              SLP Short Term Goals - 05/17/22 0738       SLP SHORT TERM GOAL #5   Title Pt will identify right vs left in 8 out of 10 opportunities.    Baseline unable    Time 10    Period --   sessions   Status New  SLP Long Term Goals - 04/27/22 0720       SLP LONG TERM GOAL #1   Title Patient will use multi-modal communication to communitcate complex wants and needs necessary for IADLS    Status On-going    Target Date 07/11/22      SLP LONG TERM GOAL #2   Title Pt will write/type biographical information with 80% accuracy to gain independence with completing forms related to appts, disability, financial.    Status On-going    Target Date 07/11/22              Plan - 05/18/22 1410     Clinical Impression Statement Pt struggled with directional words that would be beneficial in potentially describing pain such as "right knee, left knee." As such, continued skilled ST intervention continues to be indicated to increase functional independence and reduce caregiver burden.    Speech Therapy Frequency 2x / week    Duration 12 weeks     Treatment/Interventions Language facilitation;Cueing hierarchy;Multimodal communcation approach;Compensatory strategies;SLP instruction and feedback;Functional tasks;Patient/family education    Potential to Achieve Goals Good    Potential Considerations Severity of impairments;Family/community support;Ability to learn/carryover information;Cooperation/participation level;Previous level of function    SLP Home Exercise Plan provided, see pt instructions section    Consulted and Agree with Plan of Care Patient;Family member/caregiver    Family Member Consulted pt's sister             Patient will benefit from skilled therapeutic intervention in order to improve the following deficits and impairments:   Aphasia  Apraxia  Cerebrovascular accident (CVA) due to thrombosis of left middle cerebral artery (H[facility])  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 Dreama Saa M.S., [name], CBIS Speech-Language Pathologist Certified Brain Injury Specialist [facility]  [facility] [phone] Ascom [phone] Fax [phone]  Reuel Derby, Franchot Erichsen 05/18/2022, 2:12 PM  [place] [facility] MAIN [facility] SERVICES [address] [place], [place], [address] Phone: [phone]   Fax:  [phone]   Name: Peter Lucas MRN: [id number] Date of Birth: Nov 04, 1966

## 2022-05-18 NOTE — Patient Instructions (Signed)
Watch SLP video for labeling of body parts and then pairing printed word with body part

## 2022-05-22 ENCOUNTER — Ambulatory Visit: Payer: BC Managed Care – PPO | Admitting: Speech Pathology

## 2022-05-22 DIAGNOSIS — R4701 Aphasia: Secondary | ICD-10-CM | POA: Diagnosis not present

## 2022-05-22 DIAGNOSIS — I639 Cerebral infarction, unspecified: Secondary | ICD-10-CM

## 2022-05-22 DIAGNOSIS — R482 Apraxia: Secondary | ICD-10-CM

## 2022-05-22 DIAGNOSIS — I63312 Cerebral infarction due to thrombosis of left middle cerebral artery: Secondary | ICD-10-CM

## 2022-05-22 NOTE — Therapy (Signed)
[place] [facility] MAIN [facility] SERVICES [address] [place], [place], [address] Phone: [phone]   Fax:  [phone]  Speech Language Pathology Treatment  Patient Details  Name: Peter Lucas MRN: [id number] Date of Birth: [birth year] Referring Provider (SLP): Claudette Laws   Encounter Date: 05/22/2022   End of Session - 05/22/22 1159     Visit Number 76    Number of Visits 91    Date for SLP Re-Evaluation 07/10/22    Authorization Type [employer]    Authorization Time Period 04/17/2022 thru 07/10/2022 - 60 visits calendar year    Authorization - Visit Number 47    Authorization - Number of Visits 60    Progress Note Due on Visit 80    SLP Start Time 0900    SLP Stop Time  0935    SLP Time Calculation (min) 35 min    Activity Tolerance Patient limited by pain             Past Medical History:  Diagnosis Date   DM (diabetes mellitus) (H[facility])    Hyperlipidemia    Hypertension     Past Surgical History:  Procedure Laterality Date   BACK SURGERY     cyst removal   IR CT HEAD LTD  05/07/2021   IR PERCUTANEOUS ART THROMBECTOMY/INFUSION INTRACRANIAL INC DIAG ANGIO  05/07/2021   RADIOLOGY WITH ANESTHESIA N/A 05/07/2021   Procedure: IR WITH ANESTHESIA;  Surgeon: Radiologist, Medication, MD;  [facility];  Service: Radiology;  Laterality: N/A;    There were no vitals filed for this visit.   Subjective Assessment - 05/22/22 1154     Subjective pt didn't feel good, reports having a headache    Patient is accompained by: Family member    Currently in Pain? No/denies                   ADULT SLP TREATMENT - 05/22/22 0001       Treatment Provided   Treatment provided Cognitive-Linquistic      Cognitive-Linquistic Treatment   Treatment focused on Aphasia;Apraxia;Patient/family/caregiver education    Skilled Treatment Skilled treatment session focused on pt's language impairments, specifically auditory  comprehension, speech intelligibility and reading comprehension.      SLP facilitated session by providing the following interventions:        Receptive Language:  Pt able to identify basic body parts in field of 3 with 75% improving to 100% with extra time and verbal repetition    Speech Intelligibility:  Naming body part pictures: 67% improving to 100% with written word   Reading written word: 87%, errors characteristic of phonemic paraphasias; improved to 95% with verbal and visual cues    Reading comprehension:  Placing written word on picture of body part: 80% improving to 100% with deductive problem solving by patient              SLP Education - 05/22/22 1158     Education Details s/s of potential stroke to be aware of given headache, sister to continue to check on pt throughout the day    Person(s) Educated Patient;Other (comment)   pt's sister   Methods Explanation;Demonstration;Verbal cues    Comprehension Verbalized understanding;Returned demonstration              SLP Short Term Goals - 05/17/22 0738       SLP SHORT TERM GOAL #5   Title Pt will identify right vs left in 8 out of 10  opportunities.    Baseline unable    Time 10    Period --   sessions   Status New              SLP Long Term Goals - 04/27/22 0720       SLP LONG TERM GOAL #1   Title Patient will use multi-modal communication to communitcate complex wants and needs necessary for IADLS    Status On-going    Target Date 07/11/22      SLP LONG TERM GOAL #2   Title Pt will write/type biographical information with 80% accuracy to gain independence with completing forms related to appts, disability, financial.    Status On-going    Target Date 07/11/22              Plan - 05/22/22 1159     Clinical Impression Statement PT continues to benefit from skilled ST intervention to improve funcitonal use of language pertaining to body parts to increase his functional independence when describing  medical concerns.    Speech Therapy Frequency 2x / week    Duration 12 weeks    Treatment/Interventions Language facilitation;Cueing hierarchy;Multimodal communcation approach;Compensatory strategies;SLP instruction and feedback;Functional tasks;Patient/family education    Potential to Achieve Goals Good    Potential Considerations Severity of impairments;Family/community support;Ability to learn/carryover information;Cooperation/participation level;Previous level of function    SLP Home Exercise Plan provided, see pt instructions section    Consulted and Agree with Plan of Care Patient;Family member/caregiver    Family Member Consulted pt's sister; pt's daughter via phone             Patient will benefit from skilled therapeutic intervention in order to improve the following deficits and impairments:   Aphasia  Apraxia  Cerebrovascular accident (CVA) due to thrombosis of left middle cerebral artery (HCC)  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 Dreama Saa M.S., [name], CBIS Speech-Language Pathologist Certified Brain Injury Specialist [facility]  [facility] [phone] Ascom [phone] Fax [phone]  Leo Rod 05/22/2022, 12:03 PM  [place] [facility] MAIN [facility] SERVICES [address] [place], [place], [address] Phone: [phone]   Fax:  [phone]   Name: Peter Lucas MRN: [id number] Date of Birth: 1966/06/26

## 2022-05-22 NOTE — Patient Instructions (Signed)
Use video to help with naming body parts

## 2022-05-23 ENCOUNTER — Emergency Department
Admission: EM | Admit: 2022-05-23 | Discharge: 2022-05-23 | Disposition: A | Discharge status: Home / Self Care | Payer: BC Managed Care – PPO | Attending: Emergency Medicine | Admitting: Emergency Medicine

## 2022-05-23 ENCOUNTER — Emergency Department: Payer: BC Managed Care – PPO

## 2022-05-23 ENCOUNTER — Other Ambulatory Visit: Payer: Self-pay

## 2022-05-23 DIAGNOSIS — E860 Dehydration: Secondary | ICD-10-CM

## 2022-05-23 DIAGNOSIS — Z20822 Contact with and (suspected) exposure to covid-19: Secondary | ICD-10-CM | POA: Diagnosis not present

## 2022-05-23 DIAGNOSIS — N179 Acute kidney failure, unspecified: Secondary | ICD-10-CM

## 2022-05-23 DIAGNOSIS — R531 Weakness: Secondary | ICD-10-CM | POA: Diagnosis not present

## 2022-05-23 DIAGNOSIS — R6883 Chills (without fever): Secondary | ICD-10-CM | POA: Insufficient documentation

## 2022-05-23 LAB — BASIC METABOLIC PANEL
Anion gap: 12 (ref 5–15)
Anion gap: 10 (ref 5–15)
Anion gap: 7 (ref 5–15)
BUN: 35 mg/dL — ABNORMAL HIGH (ref 6–20)
BUN: 34 mg/dL — ABNORMAL HIGH (ref 6–20)
BUN: 30 mg/dL — ABNORMAL HIGH (ref 6–20)
CO2: 19 mmol/L — ABNORMAL LOW (ref 22–32)
CO2: 21 mmol/L — ABNORMAL LOW (ref 22–32)
CO2: 23 mmol/L (ref 22–32)
Calcium: 8.8 mg/dL — ABNORMAL LOW (ref 8.9–10.3)
Calcium: 8.7 mg/dL — ABNORMAL LOW (ref 8.9–10.3)
Calcium: 8.3 mg/dL — ABNORMAL LOW (ref 8.9–10.3)
Chloride: 104 mmol/L (ref 98–111)
Chloride: 105 mmol/L (ref 98–111)
Chloride: 106 mmol/L (ref 98–111)
Creatinine, Ser: 2.23 mg/dL — ABNORMAL HIGH (ref 0.61–1.24)
Creatinine, Ser: 2.41 mg/dL — ABNORMAL HIGH (ref 0.61–1.24)
Creatinine, Ser: 2.01 mg/dL — ABNORMAL HIGH (ref 0.61–1.24)
GFR, Estimated: 34 mL/min — ABNORMAL LOW (ref >60)
GFR, Estimated: 31 mL/min — ABNORMAL LOW (ref >60)
GFR, Estimated: 38 mL/min — ABNORMAL LOW (ref >60)
Glucose, Bld: 158 mg/dL — ABNORMAL HIGH (ref 70–99)
Glucose, Bld: 131 mg/dL — ABNORMAL HIGH (ref 70–99)
Glucose, Bld: 172 mg/dL — ABNORMAL HIGH (ref 70–99)
Potassium: 3.7 mmol/L (ref 3.5–5.1)
Potassium: 3.6 mmol/L (ref 3.5–5.1)
Potassium: 3.8 mmol/L (ref 3.5–5.1)
Sodium: 135 mmol/L (ref 135–145)
Sodium: 136 mmol/L (ref 135–145)
Sodium: 136 mmol/L (ref 135–145)

## 2022-05-23 LAB — HEPATIC FUNCTION PANEL
ALT: 54 U/L — ABNORMAL HIGH (ref 0–44)
AST: 43 U/L — ABNORMAL HIGH (ref 15–41)
Albumin: 3.4 g/dL — ABNORMAL LOW (ref 3.5–5.0)
Alkaline Phosphatase: 74 U/L (ref 38–126)
Bilirubin, Direct: 0.2 mg/dL (ref 0.0–0.2)
Indirect Bilirubin: 0.6 mg/dL (ref 0.3–0.9)
Total Bilirubin: 0.8 mg/dL (ref 0.3–1.2)
Total Protein: 7.5 g/dL (ref 6.5–8.1)

## 2022-05-23 LAB — CBC
HCT: 43.2 % (ref 39.0–52.0)
Hemoglobin: 14 g/dL (ref 13.0–17.0)
MCH: 26.7 pg (ref 26.0–34.0)
MCHC: 32.4 g/dL (ref 30.0–36.0)
MCV: 82.3 fL (ref 80.0–100.0)
Platelets: 147 10*3/uL — ABNORMAL LOW (ref 150–400)
RBC: 5.25 MIL/uL (ref 4.22–5.81)
RDW: 15.8 % — ABNORMAL HIGH (ref 11.5–15.5)
WBC: 3.3 10*3/uL — ABNORMAL LOW (ref 4.0–10.5)
nRBC: 0 % (ref 0.0–0.2)

## 2022-05-23 LAB — LACTIC ACID, PLASMA
Lactic Acid, Venous: 2 mmol/L (ref 0.5–1.9)
Lactic Acid, Venous: 1.4 mmol/L (ref 0.5–1.9)

## 2022-05-23 LAB — URINALYSIS, COMPLETE (UACMP) WITH MICROSCOPIC
Bacteria, UA: NONE SEEN
Bilirubin Urine: NEGATIVE
Glucose, UA: NEGATIVE mg/dL
Ketones, ur: NEGATIVE mg/dL
Leukocytes,Ua: NEGATIVE
Nitrite: NEGATIVE
Protein, ur: NEGATIVE mg/dL
Specific Gravity, Urine: 1.008 (ref 1.005–1.030)
Squamous Epithelial / HPF: NONE SEEN (ref 0–5)
pH: 5 (ref 5.0–8.0)

## 2022-05-23 LAB — CBC WITH DIFFERENTIAL/PLATELET
Abs Immature Granulocytes: 0.01 10*3/uL (ref 0.00–0.07)
Basophils Absolute: 0 10*3/uL (ref 0.0–0.1)
Basophils Relative: 0 %
Eosinophils Absolute: 0 10*3/uL (ref 0.0–0.5)
Eosinophils Relative: 0 %
HCT: 42.8 % (ref 39.0–52.0)
Hemoglobin: 13.9 g/dL (ref 13.0–17.0)
Immature Granulocytes: 0 %
Lymphocytes Relative: 12 %
Lymphs Abs: 0.4 10*3/uL — ABNORMAL LOW (ref 0.7–4.0)
MCH: 26.4 pg (ref 26.0–34.0)
MCHC: 32.5 g/dL (ref 30.0–36.0)
MCV: 81.2 fL (ref 80.0–100.0)
Monocytes Absolute: 0.3 10*3/uL (ref 0.1–1.0)
Monocytes Relative: 7 %
Neutro Abs: 2.8 10*3/uL (ref 1.7–7.7)
Neutrophils Relative %: 81 %
Platelets: 129 10*3/uL — ABNORMAL LOW (ref 150–400)
RBC: 5.27 MIL/uL (ref 4.22–5.81)
RDW: 15.8 % — ABNORMAL HIGH (ref 11.5–15.5)
WBC: 3.4 10*3/uL — ABNORMAL LOW (ref 4.0–10.5)
nRBC: 0 % (ref 0.0–0.2)

## 2022-05-23 LAB — CK: Total CK: 115 U/L (ref 49–397)

## 2022-05-23 LAB — CBG MONITORING, ED: Glucose-Capillary: 160 mg/dL — ABNORMAL HIGH (ref 70–99)

## 2022-05-23 LAB — PROCALCITONIN: Procalcitonin: 1.29 ng/mL

## 2022-05-23 LAB — SARS CORONAVIRUS 2 BY RT PCR: SARS Coronavirus 2 by RT PCR: NEGATIVE

## 2022-05-23 MED ORDER — SODIUM CHLORIDE 0.9 % IV BOLUS
1000.0000 mL | Freq: Once | INTRAVENOUS | Status: AC
  Administered 2022-05-23: 1000 mL via INTRAVENOUS

## 2022-05-23 MED ORDER — LACTATED RINGERS IV BOLUS
1000.0000 mL | Freq: Once | INTRAVENOUS | Status: AC
  Administered 2022-05-23: 1000 mL via INTRAVENOUS

## 2022-05-23 NOTE — Discharge Instructions (Addendum)
Please avoid any NSAID medications including ibuprofen Aleve Motrin or Advil.  Please drink plenty of fluids.  Please return immediately to emergency room for any new or worsening of your symptoms.

## 2022-05-23 NOTE — ED Notes (Signed)
Pt in bed, family at bedside, pt states that he is ready to go home, pt and pt's family verbalized understanding d/c instructions and follow up, pt from dpt.

## 2022-05-23 NOTE — ED Triage Notes (Signed)
Pt here from [facility] with chills, diaphoresis, fatigue, and hypotension since Sat. Pt has hx of a stroke last year. Pt took a tylenol this morning but still is diaphoretic today.

## 2022-05-23 NOTE — ED Notes (Signed)
Pt in bed, daughter with pt, daughter states that pt has a hx of a stroke, states that pt is at his baseline, pt doesn't know day of the week or place, pt states he is here because he generally doesn't feel well, daughter states that it is because he was diaphoretic last night and hasn't felt well since doing some lawn work over the weekend.

## 2022-05-23 NOTE — ED Provider Notes (Signed)
I assumed care of this patient approximately 1500.  Please see outgoing providers note for full details on patient's initial evaluation assessment.  In brief patient presents for evaluation of low blood pressure seen in clinic earlier today in the setting of some chills.  He is noted to have an AKI on initial work-up with reassuring chest x-ray and has no historical or exam features to suggest an clear foci of infection.  He reported he has been working outside in the heat over the last couple days.  Plan is to hydrate and recheck his kidney function.  Repeat BMP shows improving creatinine.  UA is unremarkable for evidence infection.  Advised patient that he can continue to hydrate at home with history of his kidney function rechecked in 3 to 5 days by his PCP but must return for any new or acute worsening of symptoms.  Discharged stable condition.  Strict return precautions advised and discussed.  Counseled on avoiding any NSAIDs.   Gilles Chiquito, MD 05/23/22 1750

## 2022-05-23 NOTE — ED Triage Notes (Signed)
First Nurse Note:  Pt via [facility] from home. Pt c/o hypotensive, headache, sweats/chills states that he has been working in the yard on Saturday and he may have gotten dehydrated. Pt has a hx of stroke in June, with baseline aphasia. Daughter took blood pressure this AM and it was 84/49. KC reports a BP of 109/78. Pt is alert and oriented. Daughter with patient at this time.

## 2022-05-23 NOTE — ED Provider Notes (Signed)
[facility] Provider Note    Event Date/Time   First MD Initiated Contact with Patient 05/23/22 1350     (approximate)   History   Chills and Hypotension   HPI  Peter Lucas is a 56 y.o. male was power washing the deck Wednesday Thursday and Friday.  He did not feel good Friday and Saturday.  He is feeling better now but still not back to baseline.  He is feeling hot and cold and somewhat woozy.  Patient's blood pressure at home was quite low.  He felt weak.      Physical Exam   Triage Vital Signs: ED Triage Vitals  Enc Vitals Group     BP 05/23/22 1128 95/66     Pulse Rate 05/23/22 1128 (!) 104     Resp 05/23/22 1128 18     Temp 05/23/22 1128 98.6 F (37 C)     Temp Source 05/23/22 1128 Oral     SpO2 05/23/22 1128 99 %     Weight 05/23/22 1129 284 lb (128.8 kg)     Height 05/23/22 1129 6\' 1"  (1.854 m)     Head Circumference --      Peak Flow --      Pain Score 05/23/22 1129 0     Pain Loc --      Pain Edu? --      Excl. in GC? --     Most recent vital signs: Vitals:   05/23/22 1128 05/23/22 1347  BP: 95/66 116/76  Pulse: (!) 104 77  Resp: 18 18  Temp: 98.6 F (37 C) 97.9 F (36.6 C)  SpO2: 99% 98%     General: Awake, alert oriented now speaking normally. Head normocephalic atraumatic CV:  Good peripheral perfusion.  Lucas regular rate and rhythm no audible murmurs Resp:  Normal effort.  Lungs are clear Abd:  No distention.  Soft nontender no organomegaly Patient denies any focal neurological findings he feels back to baseline now.   ED Results / Procedures / Treatments   Labs (all labs ordered are listed, but only abnormal results are displayed) Labs Reviewed  CBC - Abnormal; Notable for the following components:      Result Value   WBC 3.3 (*)    RDW 15.8 (*)    Platelets 147 (*)    All other components within normal limits  BASIC METABOLIC PANEL - Abnormal; Notable for the following components:   CO2 19 (*)     Glucose, Bld 158 (*)    BUN 35 (*)    Creatinine, Ser 2.23 (*)    Calcium 8.8 (*)    GFR, Estimated 34 (*)    All other components within normal limits  LACTIC ACID, PLASMA - Abnormal; Notable for the following components:   Lactic Acid, Venous 2.0 (*)    All other components within normal limits  HEPATIC FUNCTION PANEL - Abnormal; Notable for the following components:   Albumin 3.4 (*)    AST 43 (*)    ALT 54 (*)    All other components within normal limits  CBC WITH DIFFERENTIAL/PLATELET - Abnormal; Notable for the following components:   WBC 3.4 (*)    RDW 15.8 (*)    Platelets 129 (*)    Lymphs Abs 0.4 (*)    All other components within normal limits  CBG MONITORING, ED - Abnormal; Notable for the following components:   Glucose-Capillary 160 (*)    All other components within  normal limits  SARS CORONAVIRUS 2 BY RT PCR  LACTIC ACID, PLASMA  PROCALCITONIN  BASIC METABOLIC PANEL     EKG EKG read and interpreted by me shows normal sinus rhythm rate of 82 extreme right axis deviation decreased R wave progression no acute ST-T wave changes    RADIOLOGY Chest x-ray read by radiology reviewed and interpreted by me again shows no acute disease.   PROCEDURES:  Critical Care performed:   Procedures   MEDICATIONS ORDERED IN ED: Medications  sodium chloride 0.9 % bolus 1,000 mL (1,000 mLs Intravenous New Bag/Given 05/23/22 1428)  lactated ringers bolus 1,000 mL (1,000 mLs Intravenous New Bag/Given 05/23/22 1429)     IMPRESSION / MDM / ASSESSMENT AND PLAN / ED COURSE  I reviewed the triage vital signs and the nursing notes. Patient's blood pressure is coming up nicely with fluids.  He is feeling well.  We will have to get another BNP and see if his renal function has improved.  His lactic acid is certainly gone down is not running a fever he feels well and wants to go.  His differential does have a shift to the left but its not extreme.  As long as he continues to feel  well and his AKI is improved he can probably go home follow-up with his doctor return if he is worse. Patient likely was severely dehydrated.  Patient's presentation is most consistent with acute complicated illness / injury requiring diagnostic workup.  The patient is on the cardiac monitor to evaluate for evidence of arrhythmia and/or significant Lucas rate changes.  None have been seen.  I will have to sign him out to the oncoming physician.   FINAL CLINICAL IMPRESSION(S) / ED DIAGNOSES   Final diagnoses:  Weak     Rx / DC Orders   ED Discharge Orders     None        Note:  This document was prepared using Dragon voice recognition software and may include unintentional dictation errors.   Arnaldo Natal, MD 05/23/22 [phone]

## 2022-05-24 ENCOUNTER — Ambulatory Visit: Payer: BC Managed Care – PPO | Admitting: Speech Pathology

## 2022-05-29 ENCOUNTER — Ambulatory Visit: Payer: BC Managed Care – PPO | Admitting: Speech Pathology

## 2022-05-29 ENCOUNTER — Encounter: Payer: Self-pay | Admitting: Speech Pathology

## 2022-05-29 DIAGNOSIS — R4701 Aphasia: Secondary | ICD-10-CM

## 2022-05-29 DIAGNOSIS — I63312 Cerebral infarction due to thrombosis of left middle cerebral artery: Secondary | ICD-10-CM

## 2022-05-29 DIAGNOSIS — I639 Cerebral infarction, unspecified: Secondary | ICD-10-CM

## 2022-05-29 DIAGNOSIS — R482 Apraxia: Secondary | ICD-10-CM

## 2022-05-31 ENCOUNTER — Ambulatory Visit: Payer: BC Managed Care – PPO | Admitting: Speech Pathology

## 2022-05-31 DIAGNOSIS — R482 Apraxia: Secondary | ICD-10-CM

## 2022-05-31 DIAGNOSIS — R4701 Aphasia: Secondary | ICD-10-CM

## 2022-05-31 DIAGNOSIS — I63312 Cerebral infarction due to thrombosis of left middle cerebral artery: Secondary | ICD-10-CM

## 2022-06-01 NOTE — Therapy (Addendum)
OUTPATIENT SPEECH LANGUAGE PATHOLOGY TREATMENT NOTE   Patient Name: Peter Lucas MRN: [id number] DOB:December 22, 1965, 56 y.o., male Today's Date: 06/01/2022  PCP: Wayland Denis, PA REFERRING PROVIDER: Alysia Penna, MD   End of Session - 05/31/22 1642     Visit Number 91    Number of Visits 74    Date for SLP Re-Evaluation 07/10/22    Authorization Type [employer]    Authorization Time Period 04/17/2022 thru 07/10/2022 - 60 visits calendar year    Authorization - Visit Number 49    Authorization - Number of Visits 60    Progress Note Due on Visit 54    SLP Start Time 0900    SLP Stop Time  1000    SLP Time Calculation (min) 60 min    Activity Tolerance Patient tolerated treatment well             Past Medical History:  Diagnosis Date   DM (diabetes mellitus) ([place])    Hyperlipidemia    Hypertension    Past Surgical History:  Procedure Laterality Date   BACK SURGERY     cyst removal   IR CT HEAD LTD  05/07/2021   IR PERCUTANEOUS ART THROMBECTOMY/INFUSION INTRACRANIAL INC DIAG ANGIO  05/07/2021   RADIOLOGY WITH ANESTHESIA N/A 05/07/2021   Procedure: IR WITH ANESTHESIA;  Surgeon: Radiologist, Medication, MD;  Location: [place];  Service: Radiology;  Laterality: N/A;   Patient Active Problem List   Diagnosis Date Noted   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 No     OBJECTIVE:    TODAY'S TREATMENT: Skilled treatment session focused on pt's language impairments, specifically auditory comprehension, speech intelligibility and reading comprehension.     SLP facilitated session by providing the following interventions:       Receptive Language: Constant Therapy Clinician App utilized to target receptive language and reading skills Understand Words You Hear: Level 1: 80% Level 2: 100% with rare repetition of word Level 3: 90% with rare repetition of word Understand Written Words: Level 1: 96%   RIGHT vs LEFT Body parts - conditioned pt to say each direction ("right ear") after SLP and then touch appropriate body part - task began with printed words progressing to verbal directions only   PATIENT EDUCATION: Education details: increased difficulty when listening to 2 pieces of information such as "right ear" Person educated: Patient and pt's sister Education method: Explanation, Demonstration, Verbal cues, and Handouts Education comprehension: verbalized understanding, returned demonstration, verbal cues required, and needs further education   SLP Short Term Goals -       SLP SHORT TERM GOAL #1   Title Pt will accurately write/type simple biographical and orientation with 80% accuracy given minimal multimodal cues.    Baseline goal met, revised to reflect pt progress - currentyl moderate cues required    Time 10    Period --   sessions   Status Revised      SLP SHORT TERM GOAL #2  Title Pt will improve speech approximation of functional language at the simple conversation level in 5 of 7 utterances given minimal verbal cues for correction.    Baseline goal met, revised to reflect pt progress - currently requires moderate cues    Time 10    Period --   sessions   Status Revised      SLP SHORT TERM GOAL #3   Title The pt will identify the correct word given 2 choices at 80% accuracy given frequent visual cues in order to comprehend forms for disability,  doctor's appts.    Time 10    Period --   sessions   Status On-going      SLP SHORT TERM GOAL #4   Title Pt will improve reading comprehension by entering appropriate information into online forms with 90% accuracy given supervision cues.    Baseline goal met, revised to reflect progress - currently requires moderate to minimal cues    Time 10    Period --   sessions   Status Revised      SLP SHORT TERM GOAL #5   Title Pt will improve reading comprehension by entering appropriate information into paper forms with 90% accuracy given supervision cues.    Baseline revised to reflect progress, currently requires moderate to minimal cues    Time 10    Period --   sessions   Status Revised              SLP Long Term Goals -       SLP LONG TERM GOAL #1   Title Patient will use multi-modal communication to communitcate complex wants and needs necessary for IADLS    Status On-going      SLP LONG TERM GOAL #2   Title Pt will write/type biographical information with 80% accuracy to gain independence with completing forms related to appts, disability, financial.    Status On-going              Plan -     Clinical Impression Statement Pt continues to present with mild to moderate Wernicke's Aphasia. As such, continued skilled ST intervention continues to be indicated to increase functional independence and reduce caregiver burden.    Speech Therapy Frequency 2x / week    Duration 12 weeks    Treatment/Interventions Language facilitation;Cueing hierarchy;Multimodal communcation approach;Compensatory strategies;SLP instruction and feedback;Functional tasks;Patient/family education    Potential to Achieve Goals Good    Consulted and Agree with Plan of Care Patient;Family member/caregiver    Family Member Consulted pt's sister             Marializ Ferrebee B. Rutherford Nail, M.S., [name], Mining engineer Certified Brain Injury [place]  [place] Office [phone] Ascom [phone] Fax [phone]

## 2022-06-05 ENCOUNTER — Ambulatory Visit: Payer: BC Managed Care – PPO | Attending: Physical Medicine & Rehabilitation | Admitting: Speech Pathology

## 2022-06-05 DIAGNOSIS — I6602 Occlusion and stenosis of left middle cerebral artery: Secondary | ICD-10-CM | POA: Insufficient documentation

## 2022-06-05 DIAGNOSIS — I63312 Cerebral infarction due to thrombosis of left middle cerebral artery: Secondary | ICD-10-CM

## 2022-06-05 DIAGNOSIS — R482 Apraxia: Secondary | ICD-10-CM

## 2022-06-05 DIAGNOSIS — I639 Cerebral infarction, unspecified: Secondary | ICD-10-CM

## 2022-06-05 DIAGNOSIS — R4701 Aphasia: Secondary | ICD-10-CM | POA: Diagnosis not present

## 2022-06-05 NOTE — Therapy (Signed)
OUTPATIENT SPEECH LANGUAGE PATHOLOGY TREATMENT NOTE   Patient Name: Peter Lucas MRN: [id number] DOB:01/01/1966, 56 y.o., male Today's Date: 06/05/2022  PCP: Wayland Denis, PA REFERRING PROVIDER: Alysia Penna, MD   End of Session - 06/05/22 0907     Visit Number 14    Number of Visits 11    Date for SLP Re-Evaluation 07/10/22    Authorization Type [employer]    Authorization Time Period 04/17/2022 thru 07/10/2022 - 60 visits calendar year    Authorization - Visit Number 60    Authorization - Number of Visits 60    Progress Note Due on Visit 77    SLP Start Time 0900    SLP Stop Time  1000    SLP Time Calculation (min) 60 min    Activity Tolerance Patient tolerated treatment well             Past Medical History:  Diagnosis Date   DM (diabetes mellitus) ([place])    Hyperlipidemia    Hypertension    Past Surgical History:  Procedure Laterality Date   BACK SURGERY     cyst removal   IR CT HEAD LTD  05/07/2021   IR PERCUTANEOUS ART THROMBECTOMY/INFUSION INTRACRANIAL INC DIAG ANGIO  05/07/2021   RADIOLOGY WITH ANESTHESIA N/A 05/07/2021   Procedure: IR WITH ANESTHESIA;  Surgeon: Radiologist, Medication, MD;  Location: [facility];  Service: Radiology;  Laterality: N/A;   Patient Active Problem List   Diagnosis Date Noted   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 ([place]) 05/12/2021   Stroke ([place]) 05/07/2021   Acute ischemic left MCA stroke ([place]) 05/07/2021   Middle cerebral artery embolism, left 05/07/2021    ONSET DATE: 05/07/2021 initial dx; 06/20/2021 referral date  REFERRING DIAG: I63.9 (ICD-10-CM) - 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 ([place])  THERAPY DIAG:  Aphasia  Apraxia  Cerebrovascular accident (CVA) due to thrombosis of left middle cerebral artery ([facility])  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 No     OBJECTIVE:   TODAY'S TREATMENT: Skilled treatment session focused on pt's language impairments, specifically auditory comprehension, speech intelligibility and reading comprehension.     SLP facilitated session by providing the following interventions:      Auditory only "show me your right/left (body part)" - with moderate repetition pt was 90% accurate  Reading comprehension: [place] 5 - Memory - pp 65-66 - reading newspaper advertisements and answering [place] questions - 90% accuracy, improving to 100% with rare Min A for yes/no questions    PATIENT EDUCATION: Education details: increased difficulty when listening to 2 pieces of information such as "right ear" Person educated: Patient and pt's sister Education method: Explanation, Demonstration, Verbal cues, and Handouts Education comprehension: verbalized understanding, returned demonstration, verbal cues required, and needs further education   SLP Short Term Goals -       SLP SHORT TERM GOAL #1   Title Pt Peter accurately write/type simple biographical and orientation with 80% accuracy given minimal multimodal cues.    Baseline goal met, revised to reflect pt progress - currentyl moderate cues required    Time 10    Period --   sessions   Status Revised      SLP SHORT TERM GOAL #2   Title Pt Peter improve speech approximation of functional language at the simple conversation level in 5 of 7 utterances given minimal verbal cues  for correction.    Baseline goal met, revised to reflect pt progress - currently requires moderate cues    Time 10    Period --   sessions   Status Revised      SLP SHORT TERM GOAL #3   Title The pt Peter identify the correct word given 2 choices at 80% accuracy given frequent visual cues in order to comprehend forms for disability, doctor's appts.    Time 10    Period --   sessions   Status On-going      SLP SHORT TERM GOAL #4   Title Pt Peter improve reading comprehension by  entering appropriate information into online forms with 90% accuracy given supervision cues.    Baseline goal met, revised to reflect progress - currently requires moderate to minimal cues    Time 10    Period --   sessions   Status Revised      SLP SHORT TERM GOAL #5   Title Pt Peter improve reading comprehension by entering appropriate information into paper forms with 90% accuracy given supervision cues.    Baseline revised to reflect progress, currently requires moderate to minimal cues    Time 10    Period --   sessions   Status Revised              SLP Long Term Goals -       SLP LONG TERM GOAL #1   Title Patient Peter use multi-modal communication to communitcate complex wants and needs necessary for IADLS    Status On-going      SLP LONG TERM GOAL #2   Title Pt Peter write/type biographical information with 80% accuracy to gain independence with completing forms related to appts, disability, financial.    Status On-going              Plan -     Clinical Impression Statement Pt continues to present with mild to moderate Wernicke's Aphasia. "It is the weirdest thing, It worries me" when referring to difficulty understanding directions related to right/left and body part. As such, continued skilled ST intervention continues to be indicated to increase functional independence and reduce caregiver burden.    Speech Therapy Frequency 2x / week    Duration 12 weeks    Treatment/Interventions Language facilitation;Cueing hierarchy;Multimodal communcation approach;Compensatory strategies;SLP instruction and feedback;Functional tasks;Patient/family education    Potential to Achieve Goals Good    Consulted and Agree with Plan of Care Patient;Family member/caregiver    Family Member Consulted pt's sister             Peter Lucas B. Rutherford Nail, M.S., [name], Mining engineer Certified Brain Injury [place]  [place] Office [phone] Ascom [phone] Fax [phone]

## 2022-06-07 ENCOUNTER — Ambulatory Visit: Payer: BC Managed Care – PPO | Admitting: Speech Pathology

## 2022-06-07 DIAGNOSIS — R4701 Aphasia: Secondary | ICD-10-CM | POA: Diagnosis not present

## 2022-06-07 DIAGNOSIS — R482 Apraxia: Secondary | ICD-10-CM

## 2022-06-07 DIAGNOSIS — I639 Cerebral infarction, unspecified: Secondary | ICD-10-CM

## 2022-06-07 DIAGNOSIS — I63312 Cerebral infarction due to thrombosis of left middle cerebral artery: Secondary | ICD-10-CM

## 2022-06-07 NOTE — Therapy (Signed)
OUTPATIENT SPEECH LANGUAGE PATHOLOGY TREATMENT NOTE 10th SESSION PROGRESS NOTE   Patient Name: Peter Lucas MRN: [id number] DOB:[birth year], 56 y.o., male Today's Date: 06/07/2022  PCP: Wayland Denis, PA REFERRING PROVIDER: Alysia Penna, MD    End of Session - 06/07/22 0911     Visit Number 18    Number of Visits 63    Date for SLP Re-Evaluation 07/10/22    Authorization Type [employer]    Authorization Time Period 04/17/2022 thru 07/10/2022 - 60 visits calendar year    Authorization - Visit Number 56    Authorization - Number of Visits 60    Progress Note Due on Visit 41    SLP Start Time 0900    SLP Stop Time  1000    SLP Time Calculation (min) 60 min    Activity Tolerance Patient tolerated treatment well             Past Medical History:  Diagnosis Date   DM (diabetes mellitus) ([place])    Hyperlipidemia    Hypertension    Past Surgical History:  Procedure Laterality Date   BACK SURGERY     cyst removal   IR CT HEAD LTD  05/07/2021   IR PERCUTANEOUS ART THROMBECTOMY/INFUSION INTRACRANIAL INC DIAG ANGIO  05/07/2021   RADIOLOGY WITH ANESTHESIA N/A 05/07/2021   Procedure: IR WITH ANESTHESIA;  Surgeon: Radiologist, Medication, MD;  Location: [facility];  Service: Radiology;  Laterality: N/A;   Patient Active Problem List   Diagnosis Date Noted   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 ([place]) 05/12/2021   Stroke ([place]) 05/07/2021   Acute ischemic left MCA stroke ([place]) 05/07/2021   Middle cerebral artery embolism, left 05/07/2021    ONSET DATE: 05/07/2021 initial dx; 06/20/2021 referral date  REFERRING DIAG: I63.9 (ICD-10-CM) - 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 No     OBJECTIVE:   TODAY'S TREATMENT: Skilled treatment session focused on pt's language impairments, specifically auditory comprehension, speech intelligibility and reading comprehension.     SLP facilitated session by providing the following interventions:      Constant Therapy Clinician utilized to target phonological processing, spelling and reading comprehension.  Pick letters to match sounds: Level 4: 75% accuracy with repetition of sound  Hear word differences (minimal pairs): Level 1: moderate assistance to achieve 70% accuracy d/t difficulty selecting "same vs different" - "I am saying it but I hit the wrong one every time" - improved to 95% with rare Min A  Level 2: 95% accuracy with repetition of words Level 3: 100% with repetition of words  Pick sounds to match letters: Level 1: 70% accuracy: improving to 95% with verbal model of specified sound; second attempt 90% with rare Min A (verbal model)  Spell what you hear (with help): Level 1: 50% despite maximal multimodal assistance    PATIENT EDUCATION: Education details: number of sessions left in POC Person educated: Patient and pt's sister Education method: Explanation, Demonstration, Verbal cues, and Handouts Education comprehension: verbalized understanding, returned demonstration, verbal cues required, and needs further education   SLP Short Term Goals -       SLP SHORT TERM GOAL #1   Title Pt will accurately write/type simple biographical and  orientation with 80% accuracy given minimal multimodal cues.    Baseline goal met, revised to reflect pt progress - currentyl moderate cues required    Time 10    Period --   sessions   Status Achieved     SLP SHORT TERM GOAL #2   Title Pt will improve speech approximation of functional language at the simple conversation level in 5 of 7 utterances given minimal verbal cues for correction.     Baseline goal met, revised to reflect pt progress - currently requires moderate cues    Time 10    Period --   sessions   Status Partially Met     SLP SHORT TERM GOAL #3   Title The pt will identify the correct word given 2 choices at 80% accuracy given frequent visual cues in order to comprehend forms for disability, doctor's appts.    Time 10    Period --   sessions   Status Achieved     SLP SHORT TERM GOAL #4   Title Pt will improve reading comprehension by entering appropriate information into online forms with 90% accuracy given supervision cues.    Baseline goal met, revised to reflect progress - currently requires moderate to minimal cues    Time 10    Period --   sessions   Status Partially met      SLP SHORT TERM GOAL #5   Title Pt will improve reading comprehension by entering appropriate information into paper forms with 90% accuracy given supervision cues.    Baseline revised to reflect progress, currently requires moderate to minimal cues    Time 10    Period --   sessions   Status Partially Met             SLP Long Term Goals -       SLP LONG TERM GOAL #1   Title Patient will use multi-modal communication to communitcate complex wants and needs necessary for IADLS    Status On-going      SLP LONG TERM GOAL #2   Title Pt will write/type biographical information with 80% accuracy to gain independence with completing forms related to appts, disability, financial.    Status On-going              Plan -     Clinical Impression Statement Pt continues to present with mild to moderate Wernicke's Aphasia. As such, continued skilled ST intervention continues to be indicated to increase functional independence and reduce caregiver burden.    Speech Therapy Frequency 2x / week    Duration 12 weeks    Treatment/Interventions Language facilitation;Cueing hierarchy;Multimodal communcation approach;Compensatory strategies;SLP instruction and feedback;Functional  tasks;Patient/family education    Potential to Achieve Goals Good    Consulted and Agree with Plan of Care Patient;Family member/caregiver    Family Member Consulted pt's sister             Domingue Coltrain B. Rutherford Nail, M.S., [name], Mining engineer Certified [facility]  [place] Office [phone] Ascom [phone] Fax [phone]

## 2022-06-12 ENCOUNTER — Ambulatory Visit: Payer: BC Managed Care – PPO | Admitting: Speech Pathology

## 2022-06-12 DIAGNOSIS — I6602 Occlusion and stenosis of left middle cerebral artery: Secondary | ICD-10-CM

## 2022-06-12 DIAGNOSIS — I63312 Cerebral infarction due to thrombosis of left middle cerebral artery: Secondary | ICD-10-CM

## 2022-06-12 DIAGNOSIS — R482 Apraxia: Secondary | ICD-10-CM

## 2022-06-12 DIAGNOSIS — R4701 Aphasia: Secondary | ICD-10-CM

## 2022-06-12 DIAGNOSIS — I639 Cerebral infarction, unspecified: Secondary | ICD-10-CM

## 2022-06-12 NOTE — Therapy (Signed)
OUTPATIENT SPEECH LANGUAGE PATHOLOGY TREATMENT NOTE 10th SESSION PROGRESS NOTE   Patient Name: Peter Lucas MRN: [id number] DOB:09-24-1966, 56 y.o., male Today's Date: 06/12/2022  PCP: [place] Denis, PA REFERRING PROVIDER: Alysia Penna, MD    End of Session - 06/12/22 0905     Visit Number 87    Number of Visits 34    Date for SLP Re-Evaluation 07/10/22    Authorization Type [employer]    Authorization Time Period 04/17/2022 thru 07/10/2022 - 60 visits calendar year    Authorization - Visit Number 52    Authorization - Number of Visits 60    Progress Note Due on Visit 10    SLP Start Time 0900    SLP Stop Time  1000    SLP Time Calculation (min) 60 min    Activity Tolerance Patient tolerated treatment well             Past Medical History:  Diagnosis Date   DM (diabetes mellitus) ([place])    Hyperlipidemia    Hypertension    Past Surgical History:  Procedure Laterality Date   BACK SURGERY     cyst removal   IR CT HEAD LTD  05/07/2021   IR PERCUTANEOUS ART THROMBECTOMY/INFUSION INTRACRANIAL INC DIAG ANGIO  05/07/2021   RADIOLOGY WITH ANESTHESIA N/A 05/07/2021   Procedure: IR WITH ANESTHESIA;  Surgeon: Radiologist, Medication, MD;  Location: [facility];  Service: Radiology;  Laterality: N/A;   Patient Active Problem List   Diagnosis Date Noted   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 No     OBJECTIVE:   TODAY'S TREATMENT: Skilled treatment session focused on pt's language impairments, specifically auditory comprehension, speech intelligibility and reading comprehension.     SLP facilitated session by providing the following interventions:      Constant Therapy Clinician utilized to target phonological processing, spelling and reading comprehension.  Pick letters to match sounds: Level 4: 90% accuracy with rare repetition of sound  Hear word differences (minimal pairs):  Level 3: 100% with rare repetition of words  Pick sounds to match letters: Level 1: 90% accuracy; independent Level 2: 75% improving to 95% with intermittent word level example  Spell what you hear (with help): Level 1: 75% with moderate assistance    PATIENT EDUCATION: Education details: number of sessions left in POC Person educated: Patient and pt's sister Education method: Explanation, Demonstration, Verbal cues, and Handouts Education comprehension: verbalized understanding, returned demonstration, verbal cues required, and needs further education   SLP Short Term Goals -       SLP SHORT TERM GOAL #1   Title Pt will accurately write/type simple biographical and orientation with 80% accuracy given minimal multimodal cues.    Baseline goal met, revised to reflect pt progress - currentyl moderate cues required    Time 10    Period --   sessions   Status Achieved  SLP SHORT TERM GOAL #2   Title Pt will improve speech approximation of functional language at the simple conversation level in 5 of 7 utterances given minimal verbal cues for correction.    Baseline goal met, revised to reflect pt progress - currently requires moderate cues    Time 10    Period --   sessions   Status Partially Met     SLP SHORT TERM GOAL #3   Title The pt will identify the correct word given 2 choices at 80%  accuracy given frequent visual cues in order to comprehend forms for disability, doctor's appts.    Time 10    Period --   sessions   Status Achieved     SLP SHORT TERM GOAL #4   Title Pt will improve reading comprehension by entering appropriate information into online forms with 90% accuracy given supervision cues.    Baseline goal met, revised to reflect progress - currently requires moderate to minimal cues    Time 10    Period --   sessions   Status Partially met      SLP SHORT TERM GOAL #5   Title Pt will improve reading comprehension by entering appropriate information into paper forms with 90% accuracy given supervision cues.    Baseline revised to reflect progress, currently requires moderate to minimal cues    Time 10    Period --   sessions   Status Partially Met             SLP Long Term Goals -       SLP LONG TERM GOAL #1   Title Patient will use multi-modal communication to communitcate complex wants and needs necessary for IADLS    Status On-going      SLP LONG TERM GOAL #2   Title Pt will write/type biographical information with 80% accuracy to gain independence with completing forms related to appts, disability, financial.    Status On-going              Plan -     Clinical Impression Statement Pt continues to present with mild to moderate Wernicke's Aphasia, thus difficulty hearing, reading, writing sounds is severely impacted. As such, continued skilled ST intervention continues to be indicated to increase functional independence and reduce caregiver burden.    Speech Therapy Frequency 2x / week    Duration 12 weeks    Treatment/Interventions Language facilitation;Cueing hierarchy;Multimodal communcation approach;Compensatory strategies;SLP instruction and feedback;Functional tasks;Patient/family education    Potential to Achieve Goals Good    Consulted and Agree with Plan of Care Patient;Family member/caregiver    Family Member Consulted pt's  sister             Manvi Guilliams B. Rutherford Nail, M.S., [name], Mining engineer Certified Brain Injury [place]  [place] Office [phone] Ascom [phone] Fax [phone]

## 2022-06-14 ENCOUNTER — Telehealth: Payer: Self-pay | Admitting: Adult Health

## 2022-06-14 ENCOUNTER — Ambulatory Visit: Payer: BC Managed Care – PPO | Admitting: Speech Pathology

## 2022-06-14 DIAGNOSIS — R4701 Aphasia: Secondary | ICD-10-CM

## 2022-06-14 DIAGNOSIS — I63312 Cerebral infarction due to thrombosis of left middle cerebral artery: Secondary | ICD-10-CM

## 2022-06-14 DIAGNOSIS — I639 Cerebral infarction, unspecified: Secondary | ICD-10-CM

## 2022-06-14 NOTE — Telephone Encounter (Signed)
Medical records printed and attached to form. LVM for patient advising of $50 form fee and asking for call back.

## 2022-06-14 NOTE — Telephone Encounter (Signed)
Received paperwork from [facility], will give to medical records

## 2022-06-14 NOTE — Therapy (Signed)
OUTPATIENT SPEECH LANGUAGE PATHOLOGY TREATMENT NOTE 10th SESSION PROGRESS NOTE   Patient Name: Peter Lucas MRN: [id number] DOB:Mar 02, 1966, 56 y.o., male Today's Date: 06/14/2022  PCP: [place] Denis, PA REFERRING PROVIDER: Alysia Penna, MD    End of Session - 06/14/22 2030     Visit Number 69    Number of Visits 32    Date for SLP Re-Evaluation 07/10/22    Authorization Type [employer]    Authorization Time Period 04/17/2022 thru 07/10/2022 - 60 visits calendar year    Authorization - Visit Number 53    Authorization - Number of Visits 60    Progress Note Due on Visit 9    SLP Start Time 0900    SLP Stop Time  1000    SLP Time Calculation (min) 60 min    Activity Tolerance Patient tolerated treatment well             Past Medical History:  Diagnosis Date   DM (diabetes mellitus) ([place])    Hyperlipidemia    Hypertension    Past Surgical History:  Procedure Laterality Date   BACK SURGERY     cyst removal   IR CT HEAD LTD  05/07/2021   IR PERCUTANEOUS ART THROMBECTOMY/INFUSION INTRACRANIAL INC DIAG ANGIO  05/07/2021   RADIOLOGY WITH ANESTHESIA N/A 05/07/2021   Procedure: IR WITH ANESTHESIA;  Surgeon: Radiologist, Medication, MD;  Location: [facility];  Service: Radiology;  Laterality: N/A;   Patient Active Problem List   Diagnosis Date Noted   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 ([place]) 05/12/2021   Stroke ([place]) 05/07/2021   Acute ischemic left MCA stroke ([place]) 05/07/2021   Middle cerebral artery embolism, left 05/07/2021    ONSET DATE: 05/07/2021 initial dx; 06/20/2021 referral date  REFERRING DIAG: I63.9 (ICD-10-CM) - 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 ([place])  THERAPY DIAG:  Aphasia  Cerebrovascular accident (CVA) due to thrombosis of left middle cerebral artery ([place])  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 ([place])  Rationale for Evaluation and Treatment Rehabilitation  SUBJECTIVE: I'm  good  PAIN:  Are you having pain? No     OBJECTIVE:   TODAY'S TREATMENT: Skilled treatment session focused on pt's language impairments, specifically auditory comprehension, speech intelligibility and reading comprehension.     SLP facilitated session by providing the following interventions:      Constant Therapy Clinician utilized to target phonological processing, spelling and reading comprehension.   Hear Word Descriptions (minimal pairs):    Level 1: 80% accuracy with max-moderate multimodal assistance    Portions of the [place] were administered. Pt's verbal expression is c/b phonemic, verbal, semantic paraphasias and perseveration.   PATIENT EDUCATION: Education details: number of sessions left in POC Person educated: Patient and pt's sister Education method: Explanation, Demonstration, Verbal cues, and Handouts Education comprehension: verbalized understanding, returned demonstration, verbal cues required, and needs further education   SLP Short Term Goals -       SLP SHORT TERM GOAL #1   Title Pt will accurately write/type simple biographical and orientation with 80% accuracy given minimal multimodal cues.    Baseline goal met, revised to reflect pt progress - currentyl moderate cues required    Time 10    Period --   sessions   Status Achieved     SLP SHORT TERM GOAL #2   Title Pt will improve speech approximation of functional language at the simple conversation level in 5 of 7 utterances given minimal verbal cues for  correction.    Baseline goal met, revised to reflect pt progress - currently requires moderate cues    Time 10    Period --   sessions   Status Partially Met     SLP SHORT TERM GOAL #3   Title The pt will identify the correct word given 2 choices at 80% accuracy given frequent visual cues in order to comprehend forms for disability, doctor's appts.    Time 10    Period --   sessions   Status Achieved     SLP SHORT TERM GOAL #4    Title Pt will improve reading comprehension by entering appropriate information into online forms with 90% accuracy given supervision cues.    Baseline goal met, revised to reflect progress - currently requires moderate to minimal cues    Time 10    Period --   sessions   Status Partially met      SLP SHORT TERM GOAL #5   Title Pt will improve reading comprehension by entering appropriate information into paper forms with 90% accuracy given supervision cues.    Baseline revised to reflect progress, currently requires moderate to minimal cues    Time 10    Period --   sessions   Status Partially Met             SLP Long Term Goals -       SLP LONG TERM GOAL #1   Title Patient will use multi-modal communication to communitcate complex wants and needs necessary for IADLS    Status On-going      SLP LONG TERM GOAL #2   Title Pt will write/type biographical information with 80% accuracy to gain independence with completing forms related to appts, disability, financial.    Status On-going              Plan -     Clinical Impression Statement Pt continues to present with mild to moderate Wernicke's Aphasia, thus difficulty hearing, reading, writing sounds is severely impacted as well as his verbal expression. As such, continued skilled ST intervention continues to be indicated to increase functional independence and reduce caregiver burden.    Speech Therapy Frequency 2x / week    Duration 12 weeks    Treatment/Interventions Language facilitation;Cueing hierarchy;Multimodal communcation approach;Compensatory strategies;SLP instruction and feedback;Functional tasks;Patient/family education    Potential to Achieve Goals Good    Consulted and Agree with Plan of Care Patient;Family member/caregiver    Family Member Consulted pt's sister             Kenzly Rogoff B. Rutherford Nail, M.S., [name], Mining engineer Certified Brain Injury [place]  [place] Office [phone] Ascom [phone] Fax [phone]

## 2022-06-15 ENCOUNTER — Telehealth: Payer: Self-pay | Admitting: *Deleted

## 2022-06-15 DIAGNOSIS — I639 Cerebral infarction, unspecified: Secondary | ICD-10-CM

## 2022-06-15 DIAGNOSIS — R4701 Aphasia: Secondary | ICD-10-CM

## 2022-06-15 NOTE — Telephone Encounter (Signed)
Ms Querry called saying her fathers ST ends this month and is asking if his order can be renewed.

## 2022-06-16 NOTE — Telephone Encounter (Signed)
Renewal for ST placed and daughter notified.

## 2022-06-19 ENCOUNTER — Ambulatory Visit: Payer: BC Managed Care – PPO | Admitting: Speech Pathology

## 2022-06-19 DIAGNOSIS — I63312 Cerebral infarction due to thrombosis of left middle cerebral artery: Secondary | ICD-10-CM

## 2022-06-19 DIAGNOSIS — I639 Cerebral infarction, unspecified: Secondary | ICD-10-CM

## 2022-06-19 DIAGNOSIS — R4701 Aphasia: Secondary | ICD-10-CM

## 2022-06-19 NOTE — Therapy (Signed)
OUTPATIENT SPEECH LANGUAGE PATHOLOGY TREATMENT NOTE    Patient Name: Peter Lucas MRN: [id number] DOB:[birth year], 56 y.o., male Today's Date: 06/19/2022  PCP: [place] Denis, PA REFERRING PROVIDER: Alysia Penna, MD    End of Session - 06/19/22 0910     Visit Number 77    Number of Visits 59    Date for SLP Re-Evaluation 07/10/22    Authorization Type [employer]    Authorization Time Period 04/17/2022 thru 07/10/2022 - 60 visits calendar year    Authorization - Visit Number 72    Authorization - Number of Visits 60    Progress Note Due on Visit 84    SLP Start Time 0900    SLP Stop Time  1000    SLP Time Calculation (min) 60 min    Activity Tolerance Patient tolerated treatment well             Past Medical History:  Diagnosis Date   DM (diabetes mellitus) ([place])    Hyperlipidemia    Hypertension    Past Surgical History:  Procedure Laterality Date   BACK SURGERY     cyst removal   IR CT HEAD LTD  05/07/2021   IR PERCUTANEOUS ART THROMBECTOMY/INFUSION INTRACRANIAL INC DIAG ANGIO  05/07/2021   RADIOLOGY WITH ANESTHESIA N/A 05/07/2021   Procedure: IR WITH ANESTHESIA;  Surgeon: Radiologist, Medication, MD;  Location: [facility];  Service: Radiology;  Laterality: N/A;   Patient Active Problem List   Diagnosis Date Noted   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 ([place]) 05/12/2021   Stroke ([place]) 05/07/2021   Acute ischemic left MCA stroke ([place]) 05/07/2021   Middle cerebral artery embolism, left 05/07/2021    ONSET DATE: 05/07/2021 initial dx; 06/20/2021 referral date  REFERRING DIAG: I63.9 (ICD-10-CM) - 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 No     OBJECTIVE:   TODAY'S TREATMENT: Skilled treatment session focused on pt's language impairments, specifically auditory comprehension, speech intelligibility and reading comprehension.     SLP facilitated session by providing the following interventions:      Conversational starters: Pt with much improved conversational speech describing his brother spending the weekend with him, getting his haircut, as well as sharing pictures of produce from his garden - able to convey message with ~90% meaning  Receptive Language: SLP engaged pt in game of Checkers, pt able to understand auditory information related to rule in 12 out of 15 opportunities  PATIENT EDUCATION: Education details: number of sessions left in POC Person educated: Patient and pt's sister Education method: Explanation, Demonstration, Verbal cues, and Handouts Education comprehension: verbalized understanding, returned demonstration, verbal cues required, and needs further education   SLP Short Term Goals -       SLP SHORT TERM GOAL #1   Title Pt will accurately write/type simple biographical and orientation with 80% accuracy given minimal multimodal cues.    Baseline goal met, revised to reflect pt progress - currentyl moderate cues required    Time 10    Period --   sessions   Status Achieved     SLP SHORT TERM GOAL #2   Title Pt will improve speech approximation of functional language at the simple  conversation level in 5 of 7 utterances given minimal verbal cues for correction.    Baseline goal met, revised to reflect pt progress - currently requires moderate cues    Time 10    Period --   sessions   Status Partially Met     SLP SHORT TERM GOAL #3   Title The pt will identify the correct word given 2 choices at 80% accuracy given frequent visual cues in order to comprehend forms for disability, doctor's appts.    Time 10    Period --   sessions   Status  Achieved     SLP SHORT TERM GOAL #4   Title Pt will improve reading comprehension by entering appropriate information into online forms with 90% accuracy given supervision cues.    Baseline goal met, revised to reflect progress - currently requires moderate to minimal cues    Time 10    Period --   sessions   Status Partially met      SLP SHORT TERM GOAL #5   Title Pt will improve reading comprehension by entering appropriate information into paper forms with 90% accuracy given supervision cues.    Baseline revised to reflect progress, currently requires moderate to minimal cues    Time 10    Period --   sessions   Status Partially Met             SLP Long Term Goals -       SLP LONG TERM GOAL #1   Title Patient will use multi-modal communication to communitcate complex wants and needs necessary for IADLS    Status On-going      SLP LONG TERM GOAL #2   Title Pt will write/type biographical information with 80% accuracy to gain independence with completing forms related to appts, disability, financial.    Status On-going              Plan -     Clinical Impression Statement Pt's daughter reached out to rehab MD requesting more ST sessions. Education provided on need to appeal number of visits with pt's insurance company. As such, continued skilled ST intervention continues to be indicated to increase functional independence and reduce caregiver burden.    Speech Therapy Frequency 2x / week    Duration 12 weeks    Treatment/Interventions Language facilitation;Cueing hierarchy;Multimodal communcation approach;Compensatory strategies;SLP instruction and feedback;Functional tasks;Patient/family education    Potential to Achieve Goals Good    Consulted and Agree with Plan of Care Patient;Family member/caregiver    Family Member Consulted pt's sister             Emnet Monk B. Rutherford Nail, M.S., [name], Mining engineer Certified Brain Injury [place]  [place] Office [phone] Ascom [phone] Fax [phone]

## 2022-06-21 ENCOUNTER — Ambulatory Visit: Payer: BC Managed Care – PPO | Admitting: Speech Pathology

## 2022-06-21 DIAGNOSIS — R482 Apraxia: Secondary | ICD-10-CM

## 2022-06-21 DIAGNOSIS — I63312 Cerebral infarction due to thrombosis of left middle cerebral artery: Secondary | ICD-10-CM

## 2022-06-21 DIAGNOSIS — I639 Cerebral infarction, unspecified: Secondary | ICD-10-CM

## 2022-06-21 DIAGNOSIS — R4701 Aphasia: Secondary | ICD-10-CM | POA: Diagnosis not present

## 2022-06-21 NOTE — Therapy (Signed)
OUTPATIENT SPEECH LANGUAGE PATHOLOGY TREATMENT NOTE    Patient Name: Peter Lucas MRN: [id number] DOB:11/27/1966, 56 y.o., male Today's Date: 06/21/2022  PCP: [facility] Denis, PA REFERRING PROVIDER: Alysia Penna, MD    End of Session - 06/21/22 0859     Visit Number 47    Number of Visits 62    Date for SLP Re-Evaluation 07/10/22    Authorization Type [employer]    Authorization Time Period 04/17/2022 thru 07/10/2022 - 60 visits calendar year    Authorization - Visit Number 66    Authorization - Number of Visits 60    Progress Note Due on Visit 74    SLP Start Time 0900    SLP Stop Time  1000    SLP Time Calculation (min) 60 min    Activity Tolerance Patient tolerated treatment well             Past Medical History:  Diagnosis Date   DM (diabetes mellitus) ([place])    Hyperlipidemia    Hypertension    Past Surgical History:  Procedure Laterality Date   BACK SURGERY     cyst removal   IR CT HEAD LTD  05/07/2021   IR PERCUTANEOUS ART THROMBECTOMY/INFUSION INTRACRANIAL INC DIAG ANGIO  05/07/2021   RADIOLOGY WITH ANESTHESIA N/A 05/07/2021   Procedure: IR WITH ANESTHESIA;  Surgeon: Radiologist, Medication, MD;  Location: [facility];  Service: Radiology;  Laterality: N/A;   Patient Active Problem List   Diagnosis Date Noted   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 ([place]) 05/12/2021   Stroke ([place]) 05/07/2021   Acute ischemic left MCA stroke ([place]) 05/07/2021   Middle cerebral artery embolism, left 05/07/2021    ONSET DATE: 05/07/2021 initial dx; 06/20/2021 referral date  REFERRING DIAG: I63.9 (ICD-10-CM) - 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 No     OBJECTIVE:   TODAY'S TREATMENT: Skilled treatment session focused on pt's language impairments, specifically auditory comprehension, speech intelligibility and reading comprehension.     SLP facilitated session by providing the following interventions:        Constant Therapy Clinician app utilized to target phonological processing and receptive language abilities  Pick sounds to match letters:   Level 2: 90% - much improved from baseline of 60%  Level 3: 80% improving to 95% with rare min A  Read a word you hear:  Level 1: 90% with minimal A faded to rare min A  Receptive Language: SLP engaged pt in game of UNO to promote increased participation in family activities, Pt required moderate assistance for ocmprehension of verbal directions/information related to dealing the cards as well as understanding cards such as Draw 2, reverse, WILD  PATIENT EDUCATION: Education details: number of sessions left in POC Person educated: Patient and pt's sister Education method: Explanation, Demonstration, Verbal cues, and Handouts Education comprehension: verbalized understanding, returned demonstration, verbal cues required, and needs further education   SLP Short Term Goals -       SLP SHORT TERM GOAL #1   Title Pt will accurately write/type simple biographical and orientation with 80% accuracy given minimal multimodal cues.    Baseline goal met, revised to reflect pt progress -  currentyl moderate cues required    Time 10    Period --   sessions   Status Achieved     SLP SHORT TERM GOAL #2   Title Pt will improve speech approximation of functional language at the simple conversation level in 5 of 7 utterances given minimal verbal cues for correction.    Baseline goal met, revised to reflect pt progress - currently requires moderate cues    Time 10    Period --   sessions   Status Partially Met     SLP SHORT TERM GOAL  #3   Title The pt will identify the correct word given 2 choices at 80% accuracy given frequent visual cues in order to comprehend forms for disability, doctor's appts.    Time 10    Period --   sessions   Status Achieved     SLP SHORT TERM GOAL #4   Title Pt will improve reading comprehension by entering appropriate information into online forms with 90% accuracy given supervision cues.    Baseline goal met, revised to reflect progress - currently requires moderate to minimal cues    Time 10    Period --   sessions   Status Partially met      SLP SHORT TERM GOAL #5   Title Pt will improve reading comprehension by entering appropriate information into paper forms with 90% accuracy given supervision cues.    Baseline revised to reflect progress, currently requires moderate to minimal cues    Time 10    Period --   sessions   Status Partially Met             SLP Long Term Goals -       SLP LONG TERM GOAL #1   Title Patient will use multi-modal communication to communitcate complex wants and needs necessary for IADLS    Status On-going      SLP LONG TERM GOAL #2   Title Pt will write/type biographical information with 80% accuracy to gain independence with completing forms related to appts, disability, financial.    Status On-going              Plan -     Clinical Impression Statement To increase participation in family activities, pt continues to required moderate assistance d/t Wernicke's Aphasia. Therefore skilled ST intervention continues to recommended.    Speech Therapy Frequency 2x / week    Duration 12 weeks    Treatment/Interventions Language facilitation;Cueing hierarchy;Multimodal communcation approach;Compensatory strategies;SLP instruction and feedback;Functional tasks;Patient/family education    Potential to Achieve Goals Good    Consulted and Agree with Plan of Care Patient;Family member/caregiver    Family Member Consulted pt's sister              [name] B. Rutherford Nail, M.S., [name], Mining engineer Certified Brain Injury [place]  [place] Office [phone] Ascom [phone] Fax [phone]

## 2022-06-26 ENCOUNTER — Ambulatory Visit: Payer: BC Managed Care – PPO | Admitting: Speech Pathology

## 2022-06-26 DIAGNOSIS — R4701 Aphasia: Secondary | ICD-10-CM

## 2022-06-26 DIAGNOSIS — I63312 Cerebral infarction due to thrombosis of left middle cerebral artery: Secondary | ICD-10-CM

## 2022-06-26 DIAGNOSIS — R482 Apraxia: Secondary | ICD-10-CM

## 2022-06-26 DIAGNOSIS — I639 Cerebral infarction, unspecified: Secondary | ICD-10-CM

## 2022-06-26 NOTE — Therapy (Signed)
OUTPATIENT SPEECH LANGUAGE PATHOLOGY TREATMENT NOTE    Patient Name: Peter Lucas MRN: [id number] DOB:1966/10/09, 56 y.o., male Today's Date: 06/26/2022  PCP: Wayland Denis, PA REFERRING PROVIDER: Alysia Penna, MD    End of Session - 06/26/22 1316     Visit Number 73    Number of Visits 38    Date for SLP Re-Evaluation 07/10/22    Authorization Type [employer]    Authorization Time Period 04/17/2022 thru 07/10/2022 - 60 visits calendar year    Authorization - Visit Number 56    Authorization - Number of Visits 60    Progress Note Due on Visit 68    SLP Start Time 0900    SLP Stop Time  1000    SLP Time Calculation (min) 60 min    Activity Tolerance Patient tolerated treatment well             Past Medical History:  Diagnosis Date   DM (diabetes mellitus) ([place])    Hyperlipidemia    Hypertension    Past Surgical History:  Procedure Laterality Date   BACK SURGERY     cyst removal   IR CT HEAD LTD  05/07/2021   IR PERCUTANEOUS ART THROMBECTOMY/INFUSION INTRACRANIAL INC DIAG ANGIO  05/07/2021   RADIOLOGY WITH ANESTHESIA N/A 05/07/2021   Procedure: IR WITH ANESTHESIA;  Surgeon: Radiologist, Medication, MD;  Location: [facility];  Service: Radiology;  Laterality: N/A;   Patient Active Problem List   Diagnosis Date Noted   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 ([place]) 05/12/2021   Stroke ([place]) 05/07/2021   Acute ischemic left MCA stroke ([place]) 05/07/2021   Middle cerebral artery embolism, left 05/07/2021    ONSET DATE: 05/07/2021 initial dx; 06/20/2021 referral date  REFERRING DIAG: I63.9 (ICD-10-CM) - 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 No     OBJECTIVE:   TODAY'S TREATMENT: Skilled treatment session focused on pt's language impairments, specifically auditory comprehension, speech intelligibility and reading comprehension.     SLP facilitated session by providing the following interventions:        Constant Therapy Clinician app utilized to target phonological processing and receptive language abilities  Pick sounds to match letters:   Level 4: 80% - much improved from baseline of 60%  Level 5: 80% improving to 95% with rare min A  Read a word you hear:  Level 1: 80% with minimal A faded to rare min A  PATIENT EDUCATION: Education details: number of sessions left in POC Person educated: Patient and pt's sister Education method: Explanation, Demonstration, Verbal cues, and Handouts Education comprehension: verbalized understanding, returned demonstration, verbal cues required, and needs further education   SLP Short Term Goals -       SLP SHORT TERM GOAL #1   Title Pt will accurately write/type simple biographical and orientation with 80% accuracy given minimal multimodal cues.    Baseline goal met, revised to reflect pt progress - currentyl moderate cues required    Time 10    Period --   sessions   Status Achieved     SLP SHORT TERM GOAL #2   Title Pt will improve speech approximation of functional language at the simple conversation level in 5  of 7 utterances given minimal verbal cues for correction.    Baseline goal met, revised to reflect pt progress - currently requires moderate cues    Time 10    Period --   sessions   Status Partially Met     SLP SHORT TERM GOAL #3   Title The pt will identify the correct word given 2 choices at 80% accuracy given frequent visual cues in order to comprehend forms for disability, doctor's appts.    Time 10    Period --   sessions   Status Achieved     SLP SHORT TERM GOAL #4   Title Pt will improve reading comprehension by  entering appropriate information into online forms with 90% accuracy given supervision cues.    Baseline goal met, revised to reflect progress - currently requires moderate to minimal cues    Time 10    Period --   sessions   Status Partially met      SLP SHORT TERM GOAL #5   Title Pt will improve reading comprehension by entering appropriate information into paper forms with 90% accuracy given supervision cues.    Baseline revised to reflect progress, currently requires moderate to minimal cues    Time 10    Period --   sessions   Status Partially Met             SLP Long Term Goals -       SLP LONG TERM GOAL #1   Title Patient will use multi-modal communication to communitcate complex wants and needs necessary for IADLS    Status On-going      SLP LONG TERM GOAL #2   Title Pt will write/type biographical information with 80% accuracy to gain independence with completing forms related to appts, disability, financial.    Status On-going              Plan -     Clinical Impression Statement To increase participation in family activities, pt continues to require moderate assistance d/t Wernicke's Aphasia. Therefore skilled ST intervention continues to recommended.    Speech Therapy Frequency 2x / week    Duration 12 weeks    Treatment/Interventions Language facilitation;Cueing hierarchy;Multimodal communcation approach;Compensatory strategies;SLP instruction and feedback;Functional tasks;Patient/family education    Potential to Achieve Goals Good    Consulted and Agree with Plan of Care Patient;Family member/caregiver    Family Member Consulted pt's sister             Channon Ambrosini B. Rutherford Nail, M.S., [name], Mining engineer Certified Brain Injury [place]  [place] Office [phone] Ascom [phone] Fax [phone]

## 2022-06-27 NOTE — Telephone Encounter (Signed)
Form payment made, giving to POD 3 for completion.

## 2022-06-28 ENCOUNTER — Ambulatory Visit: Payer: BC Managed Care – PPO | Admitting: Speech Pathology

## 2022-06-28 NOTE — Telephone Encounter (Signed)
Dr Wynn Banker, sport med, has been completing and did mention during pts last visit. They will need to complete. Forms returned to patent referrals.

## 2022-06-30 ENCOUNTER — Ambulatory Visit: Payer: BC Managed Care – PPO | Admitting: Speech Pathology

## 2022-06-30 DIAGNOSIS — I639 Cerebral infarction, unspecified: Secondary | ICD-10-CM

## 2022-06-30 DIAGNOSIS — R4701 Aphasia: Secondary | ICD-10-CM

## 2022-06-30 DIAGNOSIS — I63312 Cerebral infarction due to thrombosis of left middle cerebral artery: Secondary | ICD-10-CM

## 2022-06-30 DIAGNOSIS — R482 Apraxia: Secondary | ICD-10-CM

## 2022-06-30 NOTE — Therapy (Signed)
OUTPATIENT SPEECH LANGUAGE PATHOLOGY TREATMENT NOTE    Patient Name: Peter Lucas MRN: [id number] DOB:07-02-1966, 56 y.o., male Today's Date: 06/30/2022  PCP: Wayland Denis, PA REFERRING PROVIDER: Alysia Penna, MD    End of Session - 06/30/22 0910     Visit Number 75    Number of Visits 39    Date for SLP Re-Evaluation 07/10/22    Authorization Type [employer]    Authorization Time Period 04/17/2022 thru 07/10/2022 - 60 visits calendar year    Authorization - Visit Number 57    Authorization - Number of Visits 60    Progress Note Due on Visit 65    SLP Start Time 0900    SLP Stop Time  1000    SLP Time Calculation (min) 60 min    Activity Tolerance Patient tolerated treatment well             Past Medical History:  Diagnosis Date   DM (diabetes mellitus) ([place])    Hyperlipidemia    Hypertension    Past Surgical History:  Procedure Laterality Date   BACK SURGERY     cyst removal   IR CT HEAD LTD  05/07/2021   IR PERCUTANEOUS ART THROMBECTOMY/INFUSION INTRACRANIAL INC DIAG ANGIO  05/07/2021   RADIOLOGY WITH ANESTHESIA N/A 05/07/2021   Procedure: IR WITH ANESTHESIA;  Surgeon: Radiologist, Medication, MD;  Location: [facility];  Service: Radiology;  Laterality: N/A;   Patient Active Problem List   Diagnosis Date Noted   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 ([place]) 05/12/2021   Stroke ([place]) 05/07/2021   Acute ischemic left MCA stroke ([place]) 05/07/2021   Middle cerebral artery embolism, left 05/07/2021    ONSET DATE: 05/07/2021 initial dx; 06/20/2021 referral date  REFERRING DIAG: I63.9 (ICD-10-CM) - 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 ([place])  THERAPY DIAG:  Aphasia  Apraxia  Cerebrovascular accident (CVA) due to thrombosis of left middle cerebral artery (HCC)  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 ([place])  Rationale for Evaluation and Treatment Rehabilitation  SUBJECTIVE: "ball this  weekend?" PAIN:  Are you having pain? No     OBJECTIVE:   TODAY'S TREATMENT: Skilled treatment session focused on pt's language impairments, specifically auditory comprehension, speech intelligibility and reading comprehension.     SLP facilitated session by providing the following interventions:        Constant Therapy Clinician app utilized to target phonological processing and receptive language abilities  Pick sounds to match letters:   Level 4: 80% - much improved from baseline of 60%  Level 5: 80% improving to 95% with rare min A  Read a word you hear:  Level 1: 80% with minimal A faded to rare min A  PATIENT EDUCATION: Education details: number of sessions left in POC Person educated: Patient and pt's sister Education method: Explanation, Demonstration, Verbal cues, and Handouts Education comprehension: verbalized understanding, returned demonstration, verbal cues required, and needs further education   SLP Short Term Goals -       SLP SHORT TERM GOAL #1   Title Pt will accurately write/type simple biographical and orientation with 80% accuracy given minimal multimodal cues.    Baseline goal met, revised to reflect pt progress - currentyl moderate cues required    Time 10    Period --   sessions   Status Achieved     SLP SHORT TERM GOAL #2   Title Pt will improve speech approximation of functional language at the simple conversation level in 5  of 7 utterances given minimal verbal cues for correction.    Baseline goal met, revised to reflect pt progress - currently requires moderate cues    Time 10    Period --   sessions   Status Partially Met     SLP SHORT TERM GOAL #3   Title The pt will identify the correct word given 2 choices at 80% accuracy given frequent visual cues in order to comprehend forms for disability, doctor's appts.    Time 10    Period --   sessions   Status Achieved     SLP SHORT TERM GOAL #4   Title Pt will improve reading comprehension by  entering appropriate information into online forms with 90% accuracy given supervision cues.    Baseline goal met, revised to reflect progress - currently requires moderate to minimal cues    Time 10    Period --   sessions   Status Partially met      SLP SHORT TERM GOAL #5   Title Pt will improve reading comprehension by entering appropriate information into paper forms with 90% accuracy given supervision cues.    Baseline revised to reflect progress, currently requires moderate to minimal cues    Time 10    Period --   sessions   Status Partially Met             SLP Long Term Goals -       SLP LONG TERM GOAL #1   Title Patient will use multi-modal communication to communitcate complex wants and needs necessary for IADLS    Status On-going      SLP LONG TERM GOAL #2   Title Pt will write/type biographical information with 80% accuracy to gain independence with completing forms related to appts, disability, financial.    Status On-going              Plan -     Clinical Impression Statement To increase participation in family activities, pt continues to require moderate assistance d/t Wernicke's Aphasia. Therefore skilled ST intervention continues to recommended.    Speech Therapy Frequency 2x / week    Duration 12 weeks    Treatment/Interventions Language facilitation;Cueing hierarchy;Multimodal communcation approach;Compensatory strategies;SLP instruction and feedback;Functional tasks;Patient/family education    Potential to Achieve Goals Good    Consulted and Agree with Plan of Care Patient;Family member/caregiver    Family Member Consulted pt's sister             Irini Leet B. Rutherford Nail, M.S., [name], Mining engineer Certified Brain Injury [place]  [place] Office [phone] Ascom [phone] Fax [phone]

## 2022-07-03 ENCOUNTER — Ambulatory Visit: Payer: BC Managed Care – PPO | Admitting: Speech Pathology

## 2022-07-03 DIAGNOSIS — I639 Cerebral infarction, unspecified: Secondary | ICD-10-CM

## 2022-07-03 DIAGNOSIS — I63312 Cerebral infarction due to thrombosis of left middle cerebral artery: Secondary | ICD-10-CM

## 2022-07-03 DIAGNOSIS — R4701 Aphasia: Secondary | ICD-10-CM | POA: Diagnosis not present

## 2022-07-03 NOTE — Therapy (Signed)
OUTPATIENT SPEECH LANGUAGE PATHOLOGY TREATMENT NOTE    Patient Name: Peter Lucas MRN: [id number] DOB:February 23, 1966, 56 y.o., male Today's Date: 07/03/2022  PCP: Wayland Denis, PA REFERRING PROVIDER: Alysia Penna, MD    End of Session - 07/03/22 0858     Visit Number 71    Number of Visits 5    Date for SLP Re-Evaluation 07/10/22    Authorization Type [employer]    Authorization Time Period 04/17/2022 thru 07/10/2022 - 60 visits calendar year    Authorization - Visit Number 58    Authorization - Number of Visits 60    Progress Note Due on Visit 53    SLP Start Time 0900    SLP Stop Time  1000    SLP Time Calculation (min) 60 min    Activity Tolerance Patient tolerated treatment well             Past Medical History:  Diagnosis Date   DM (diabetes mellitus) ([place])    Hyperlipidemia    Hypertension    Past Surgical History:  Procedure Laterality Date   BACK SURGERY     cyst removal   IR CT HEAD LTD  05/07/2021   IR PERCUTANEOUS ART THROMBECTOMY/INFUSION INTRACRANIAL INC DIAG ANGIO  05/07/2021   RADIOLOGY WITH ANESTHESIA N/A 05/07/2021   Procedure: IR WITH ANESTHESIA;  Surgeon: Radiologist, Medication, MD;  Location: [facility];  Service: Radiology;  Laterality: N/A;   Patient Active Problem List   Diagnosis Date Noted   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 ([name]) 05/12/2021   Stroke ([name]) 05/07/2021   Acute ischemic left MCA stroke ([name]) 05/07/2021   Middle cerebral artery embolism, left 05/07/2021    ONSET DATE: 05/07/2021 initial dx; 06/20/2021 referral date  REFERRING DIAG: I63.9 (ICD-10-CM) - 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 ([name])  THERAPY DIAG:  Aphasia  Cerebrovascular accident (CVA) due to thrombosis of left middle cerebral artery ([name])  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 ([name])  Rationale for Evaluation and Treatment Rehabilitation  SUBJECTIVE: "Did he leave, how are you holding  up?" PAIN:  Are you having pain? No     OBJECTIVE:   TODAY'S TREATMENT: Skilled treatment session focused on pt's language impairments, specifically auditory comprehension, speech intelligibility and reading comprehension.     SLP facilitated session by providing the following interventions:        Complex conversation starters: pt engaged in 52 minute conversation with ~ 75% communicative effectiveness, rare Min A; improvement noted  Receptive abilities during game of checkers: moderate repetition required to improve understanding of directions  PATIENT EDUCATION: Education details: number of sessions left in POC Person educated: Patient and pt's sister Education method: Consulting civil engineer, Demonstration, Verbal cues, and Handouts Education comprehension: verbalized understanding, returned demonstration, verbal cues required, and needs further education   SLP Short Term Goals -       SLP SHORT TERM GOAL #1   Title Pt will accurately write/type simple biographical and orientation with 80% accuracy given minimal multimodal cues.    Baseline goal met, revised to reflect pt progress - currentyl moderate cues required    Time 10    Period --   sessions   Status Achieved     SLP SHORT TERM GOAL #2   Title Pt will improve speech approximation of functional language at the simple conversation level in 5 of 7 utterances given minimal verbal cues for correction.    Baseline goal met, revised to reflect pt progress - currently requires  moderate cues    Time 10    Period --   sessions   Status Partially Met     SLP SHORT TERM GOAL #3   Title The pt will identify the correct word given 2 choices at 80% accuracy given frequent visual cues in order to comprehend forms for disability, doctor's appts.    Time 10    Period --   sessions   Status Achieved     SLP SHORT TERM GOAL #4   Title Pt will improve reading comprehension by entering appropriate information into online forms with 90%  accuracy given supervision cues.    Baseline goal met, revised to reflect progress - currently requires moderate to minimal cues    Time 10    Period --   sessions   Status Partially met      SLP SHORT TERM GOAL #5   Title Pt will improve reading comprehension by entering appropriate information into paper forms with 90% accuracy given supervision cues.    Baseline revised to reflect progress, currently requires moderate to minimal cues    Time 10    Period --   sessions   Status Partially Met             SLP Long Term Goals -       SLP LONG TERM GOAL #1   Title Patient will use multi-modal communication to communitcate complex wants and needs necessary for IADLS    Status On-going      SLP LONG TERM GOAL #2   Title Pt will write/type biographical information with 80% accuracy to gain independence with completing forms related to appts, disability, financial.    Status On-going              Plan -     Clinical Impression Statement Pt continues to make progress in skilled ST intervention which is evident in his conversational speech. He continues to benefit from repetition to increase receptive language abilities.    Speech Therapy Frequency 2x / week    Duration 12 weeks    Treatment/Interventions Language facilitation;Cueing hierarchy;Multimodal communcation approach;Compensatory strategies;SLP instruction and feedback;Functional tasks;Patient/family education    Potential to Achieve Goals Good    Consulted and Agree with Plan of Care Patient;Family member/caregiver    Family Member Consulted pt's sister             Peter Lucas B. Rutherford Nail, M.S., [name], Mining engineer Certified Brain Injury [place]  [place] Office [phone] Ascom [phone] Fax [phone]

## 2022-07-05 ENCOUNTER — Ambulatory Visit: Payer: BC Managed Care – PPO | Attending: Physical Medicine & Rehabilitation | Admitting: Speech Pathology

## 2022-07-05 DIAGNOSIS — R4701 Aphasia: Secondary | ICD-10-CM

## 2022-07-05 DIAGNOSIS — I639 Cerebral infarction, unspecified: Secondary | ICD-10-CM

## 2022-07-05 DIAGNOSIS — R482 Apraxia: Secondary | ICD-10-CM | POA: Insufficient documentation

## 2022-07-05 DIAGNOSIS — I63312 Cerebral infarction due to thrombosis of left middle cerebral artery: Secondary | ICD-10-CM | POA: Diagnosis present

## 2022-07-05 NOTE — Therapy (Unsigned)
OUTPATIENT SPEECH LANGUAGE PATHOLOGY TREATMENT NOTE    Patient Name: Peter Lucas MRN: [id number] DOB:1966-06-04, 56 y.o., male Today's Date: 07/05/2022  PCP: Wayland Denis, PA REFERRING PROVIDER: Alysia Penna, MD    End of Session - 07/05/22 1639     Visit Number 74    Number of Visits 56    Date for SLP Re-Evaluation 07/10/22    Authorization Type [employer]    Authorization Time Period 04/17/2022 thru 07/10/2022 - 60 visits calendar year    Authorization - Visit Number 59    Authorization - Number of Visits 60    Progress Note Due on Visit 35    SLP Start Time 0900    SLP Stop Time  1000    SLP Time Calculation (min) 60 min    Activity Tolerance Patient tolerated treatment well             Past Medical History:  Diagnosis Date   DM (diabetes mellitus) ([place])    Hyperlipidemia    Hypertension    Past Surgical History:  Procedure Laterality Date   BACK SURGERY     cyst removal   IR CT HEAD LTD  05/07/2021   IR PERCUTANEOUS ART THROMBECTOMY/INFUSION INTRACRANIAL INC DIAG ANGIO  05/07/2021   RADIOLOGY WITH ANESTHESIA N/A 05/07/2021   Procedure: IR WITH ANESTHESIA;  Surgeon: Radiologist, Medication, MD;  Location: [facility];  Service: Radiology;  Laterality: N/A;   Patient Active Problem List   Diagnosis Date Noted   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 ([place]) 05/12/2021   Stroke ([place]) 05/07/2021   Acute ischemic left MCA stroke ([place]) 05/07/2021   Middle cerebral artery embolism, left 05/07/2021    ONSET DATE: 05/07/2021 initial dx; 06/20/2021 referral date  REFERRING DIAG: I63.9 (ICD-10-CM) - 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 No     OBJECTIVE:   TODAY'S TREATMENT: Skilled treatment session focused on pt's language impairments, specifically auditory comprehension, speech intelligibility and reading comprehension.     SLP facilitated session by providing the following interventions:        Complex conversation starters: pt engaged in 45 minute conversation with ~ 65% communicative effectiveness, rare Min A; improvement noted  Receptive abilities during conversation continue to require 2-3 repetitions of conversational questions  PATIENT EDUCATION: Education details: number of sessions left in POC Person educated: Patient and pt's sister Education method: Explanation, Demonstration, Verbal cues, and Handouts Education comprehension: verbalized understanding, returned demonstration, verbal cues required, and needs further education   SLP Short Term Goals -       SLP SHORT TERM GOAL #1   Title Pt will accurately write/type simple biographical and orientation with 80% accuracy given minimal multimodal cues.    Baseline goal met, revised to reflect pt progress - currentyl moderate cues required    Time 10    Period --   sessions   Status Achieved     SLP SHORT TERM GOAL #2   Title Pt will improve speech approximation of functional language at the simple conversation level in 5 of 7 utterances given minimal verbal cues for correction.    Baseline goal met, revised to reflect pt progress - currently requires  moderate cues    Time 10    Period --   sessions   Status Partially Met     SLP SHORT TERM GOAL #3   Title The pt will identify the correct word given 2 choices at 80% accuracy given frequent visual cues in order to comprehend forms for disability, doctor's appts.    Time 10    Period --   sessions   Status Achieved     SLP SHORT TERM GOAL #4   Title Pt will improve reading comprehension by entering appropriate information into online forms  with 90% accuracy given supervision cues.    Baseline goal met, revised to reflect progress - currently requires moderate to minimal cues    Time 10    Period --   sessions   Status Partially met      SLP SHORT TERM GOAL #5   Title Pt will improve reading comprehension by entering appropriate information into paper forms with 90% accuracy given supervision cues.    Baseline revised to reflect progress, currently requires moderate to minimal cues    Time 10    Period --   sessions   Status Partially Met             SLP Long Term Goals -       SLP LONG TERM GOAL #1   Title Patient will use multi-modal communication to communitcate complex wants and needs necessary for IADLS    Status On-going      SLP LONG TERM GOAL #2   Title Pt will write/type biographical information with 80% accuracy to gain independence with completing forms related to appts, disability, financial.    Status On-going              Plan -     Clinical Impression Statement Pt continues to make progress in skilled ST intervention which is evident in his conversational speech. He continues to benefit from repetition to increase receptive language abilities.    Speech Therapy Frequency 2x / week    Duration 12 weeks    Treatment/Interventions Language facilitation;Cueing hierarchy;Multimodal communcation approach;Compensatory strategies;SLP instruction and feedback;Functional tasks;Patient/family education    Potential to Achieve Goals Good    Consulted and Agree with Plan of Care Patient;Family member/caregiver    Family Member Consulted pt's sister             Ryane Canavan B. Rutherford Nail, M.S., [name], Mining engineer Certified Brain Injury [place]  [place] Office [phone] Ascom [phone] Fax [phone]

## 2022-07-10 ENCOUNTER — Ambulatory Visit: Payer: BC Managed Care – PPO | Admitting: Speech Pathology

## 2022-07-10 DIAGNOSIS — I63312 Cerebral infarction due to thrombosis of left middle cerebral artery: Secondary | ICD-10-CM

## 2022-07-10 DIAGNOSIS — R482 Apraxia: Secondary | ICD-10-CM

## 2022-07-10 DIAGNOSIS — R4701 Aphasia: Secondary | ICD-10-CM | POA: Diagnosis not present

## 2022-07-10 DIAGNOSIS — I639 Cerebral infarction, unspecified: Secondary | ICD-10-CM

## 2022-07-10 NOTE — Therapy (Signed)
OUTPATIENT SPEECH LANGUAGE PATHOLOGY TREATMENT NOTE DISCHARGE SUMMARY    Patient Name: Peter Lucas MRN: [id number] DOB:Nov 04, 1966, 56 y.o., male Today's Date: 07/10/2022  PCP: Wayland Denis, PA REFERRING PROVIDER: Alysia Penna, MD    End of Session - 07/10/22 1204     Visit Number 25    Number of Visits 49    Date for SLP Re-Evaluation 07/10/22    Authorization Type [employer]    Authorization Time Period 04/17/2022 thru 07/10/2022 - 60 visits calendar year    Authorization - Visit Number 66    Authorization - Number of Visits 14    SLP Start Time 0900    SLP Stop Time  1000    SLP Time Calculation (min) 60 min    Activity Tolerance Patient tolerated treatment well             Past Medical History:  Diagnosis Date   DM (diabetes mellitus) ([place])    Hyperlipidemia    Hypertension    Past Surgical History:  Procedure Laterality Date   BACK SURGERY     cyst removal   IR CT HEAD LTD  05/07/2021   IR PERCUTANEOUS ART THROMBECTOMY/INFUSION INTRACRANIAL INC DIAG ANGIO  05/07/2021   RADIOLOGY WITH ANESTHESIA N/A 05/07/2021   Procedure: IR WITH ANESTHESIA;  Surgeon: Radiologist, Medication, MD;  Location: [facility];  Service: Radiology;  Laterality: N/A;   Patient Active Problem List   Diagnosis Date Noted   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 ([place]) 05/12/2021   Stroke ([place]) 05/07/2021   Acute ischemic left MCA stroke ([place]) 05/07/2021   Middle cerebral artery embolism, left 05/07/2021    ONSET DATE: 05/07/2021 initial dx; 06/20/2021 referral date  REFERRING DIAG: I63.9 (ICD-10-CM) - 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 No     OBJECTIVE:   TODAY'S TREATMENT: Skilled treatment session focused on pt's language impairments, specifically auditory comprehension, speech intelligibility and reading comprehension.     SLP facilitated session by providing the following interventions:        Complex conversation starters: pt engaged in 66 minute conversation with ~ 65% communicative effectiveness, rare Min A; improvement noted  Receptive abilities during conversation continue to require 2-3 repetitions of conversational questions  PATIENT EDUCATION: Education details: HEP Person educated: Patient and pt's sister Education method: Consulting civil engineer, Media planner, Verbal cues, and Handouts Education comprehension: verbalized understanding, returned demonstration, verbal cues required, and needs further education   SLP Short Term Goals -       SLP SHORT TERM GOAL #1   Title Pt will accurately write/type simple biographical and orientation with 80% accuracy given minimal multimodal cues.    Baseline goal met, revised to reflect pt progress - currentyl moderate cues required    Time 10    Period --   sessions   Status Achieved     SLP SHORT TERM GOAL #2   Title Pt will improve speech approximation of functional language at the simple conversation level in 5 of 7 utterances given minimal verbal cues for correction.    Baseline goal met, revised to reflect pt progress - currently requires moderate cues    Time 10  Period --   sessions   Status Achieved     SLP SHORT TERM GOAL #3   Title The pt will identify the correct word given 2 choices at 80% accuracy given frequent visual cues in order to comprehend forms for disability, doctor's appts.    Time 10    Period --   sessions   Status Achieved     SLP SHORT TERM GOAL #4   Title Pt will improve reading comprehension by entering appropriate information into online forms with 90% accuracy given  supervision cues.    Baseline goal met, revised to reflect progress - currently requires moderate to minimal cues    Time 10    Period --   sessions   Status Achieved     SLP SHORT TERM GOAL #5   Title Pt will improve reading comprehension by entering appropriate information into paper forms with 90% accuracy given supervision cues.    Baseline revised to reflect progress, currently requires moderate to minimal cues    Time 10    Period --   sessions   Status Achieved             SLP Long Term Goals -       SLP LONG TERM GOAL #1   Title Patient will use multi-modal communication to communitcate complex wants and needs necessary for IADLS    Status Achieved     SLP LONG TERM GOAL #2   Title Pt will write/type biographical information with 80% accuracy to gain independence with completing forms related to appts, disability, financial.    Status Achieved             Plan -     Clinical Impression Statement Pt has made great progress since his CVA. At this time, pt is being discharged from services d/t meeting insurance's allotted visits.                    Consulted and Agree with Plan of Care Patient;Family member/caregiver    Family Member Consulted pt's sister             Jazleen Robeck B. Rutherford Nail, M.S., [name], Mining engineer Certified Brain Injury [facility]  [place] Office [phone] Ascom [phone] Fax [phone]

## 2022-07-12 ENCOUNTER — Ambulatory Visit: Payer: BC Managed Care – PPO | Admitting: Speech Pathology

## 2022-07-17 ENCOUNTER — Ambulatory Visit: Payer: BC Managed Care – PPO | Admitting: Speech Pathology

## 2022-07-19 ENCOUNTER — Encounter: Payer: BC Managed Care – PPO | Admitting: Speech Pathology

## 2022-07-24 ENCOUNTER — Encounter: Payer: BC Managed Care – PPO | Admitting: Speech Pathology

## 2022-07-26 ENCOUNTER — Encounter: Payer: BC Managed Care – PPO | Admitting: Speech Pathology

## 2022-07-31 ENCOUNTER — Encounter: Payer: BC Managed Care – PPO | Admitting: Speech Pathology

## 2022-08-02 ENCOUNTER — Encounter: Payer: BC Managed Care – PPO | Admitting: Speech Pathology

## 2022-08-09 ENCOUNTER — Encounter: Payer: BC Managed Care – PPO | Admitting: Speech Pathology

## 2022-08-14 ENCOUNTER — Encounter: Payer: BC Managed Care – PPO | Admitting: Speech Pathology

## 2022-08-16 ENCOUNTER — Encounter: Payer: BC Managed Care – PPO | Admitting: Speech Pathology

## 2022-08-21 ENCOUNTER — Encounter: Payer: BC Managed Care – PPO | Admitting: Speech Pathology

## 2022-08-23 ENCOUNTER — Encounter: Payer: BC Managed Care – PPO | Admitting: Speech Pathology

## 2022-08-28 ENCOUNTER — Encounter: Payer: BC Managed Care – PPO | Admitting: Speech Pathology

## 2022-08-29 ENCOUNTER — Encounter: Payer: BC Managed Care – PPO | Admitting: Speech Pathology

## 2022-08-30 ENCOUNTER — Encounter: Payer: BC Managed Care – PPO | Admitting: Speech Pathology

## 2022-09-05 ENCOUNTER — Encounter: Payer: BC Managed Care – PPO | Admitting: Speech Pathology

## 2022-09-07 ENCOUNTER — Encounter: Payer: BC Managed Care – PPO | Admitting: Speech Pathology

## 2022-09-12 ENCOUNTER — Encounter: Payer: BC Managed Care – PPO | Admitting: Speech Pathology

## 2022-09-14 ENCOUNTER — Encounter: Payer: BC Managed Care – PPO | Admitting: Speech Pathology

## 2022-09-15 ENCOUNTER — Encounter: Payer: Self-pay | Admitting: Physical Medicine & Rehabilitation

## 2022-09-15 DIAGNOSIS — I69320 Aphasia following cerebral infarction: Secondary | ICD-10-CM | POA: Insufficient documentation

## 2022-09-19 ENCOUNTER — Encounter: Payer: BC Managed Care – PPO | Admitting: Speech Pathology

## 2022-09-21 ENCOUNTER — Encounter: Payer: BC Managed Care – PPO | Admitting: Speech Pathology

## 2022-09-26 ENCOUNTER — Encounter: Payer: BC Managed Care – PPO | Admitting: Speech Pathology

## 2022-09-28 ENCOUNTER — Encounter: Payer: BC Managed Care – PPO | Admitting: Speech Pathology

## 2022-10-03 ENCOUNTER — Encounter: Payer: BC Managed Care – PPO | Admitting: Speech Pathology

## 2022-10-05 ENCOUNTER — Encounter: Payer: BC Managed Care – PPO | Admitting: Speech Pathology

## 2022-10-10 ENCOUNTER — Encounter: Payer: BC Managed Care – PPO | Admitting: Speech Pathology

## 2022-10-12 ENCOUNTER — Encounter: Payer: BC Managed Care – PPO | Admitting: Speech Pathology

## 2022-10-12 ENCOUNTER — Ambulatory Visit: Payer: BC Managed Care – PPO | Admitting: Physical Medicine & Rehabilitation

## 2022-10-17 ENCOUNTER — Encounter: Payer: BC Managed Care – PPO | Admitting: Speech Pathology

## 2022-10-19 ENCOUNTER — Encounter: Payer: BC Managed Care – PPO | Admitting: Speech Pathology

## 2022-10-24 ENCOUNTER — Encounter: Payer: BC Managed Care – PPO | Admitting: Speech Pathology

## 2022-11-03 ENCOUNTER — Encounter
Payer: BC Managed Care – PPO | Attending: Physical Medicine & Rehabilitation | Admitting: Physical Medicine & Rehabilitation

## 2022-12-18 IMAGING — CT CT HEAD CODE STROKE
4 series · 16 of 47 positions shown, 18 images · non-contrast
Comparison: None.

CLINICAL DATA: Code stroke.  55-year-old male neurologic deficit.

EXAM:
CT HEAD WITHOUT CONTRAST
TECHNIQUE: Contiguous axial images were obtained from the base of the skull
through the vertex without intravenous contrast.

[Series 2: head wo · axial · 0.45mm/px · z∈[-68,+52]mm · 7 of 33 slices shown, 9 images]
[im 5/33  brain]
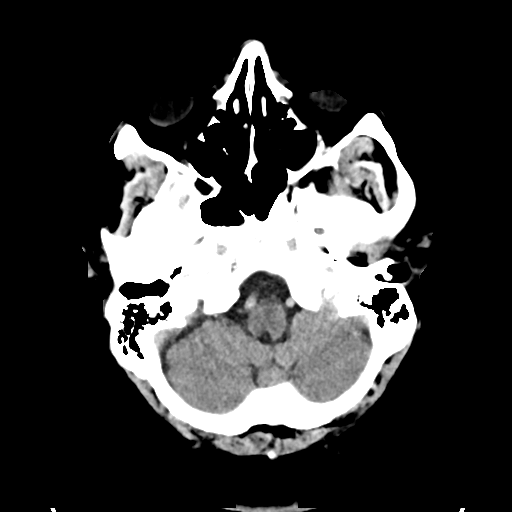
[im 5/33  bone]
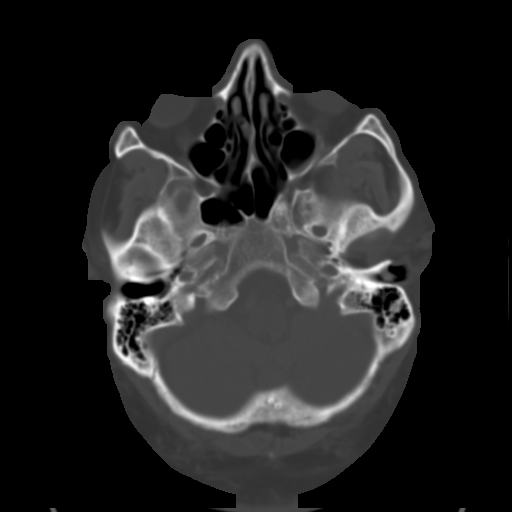
[im 9/33  brain]
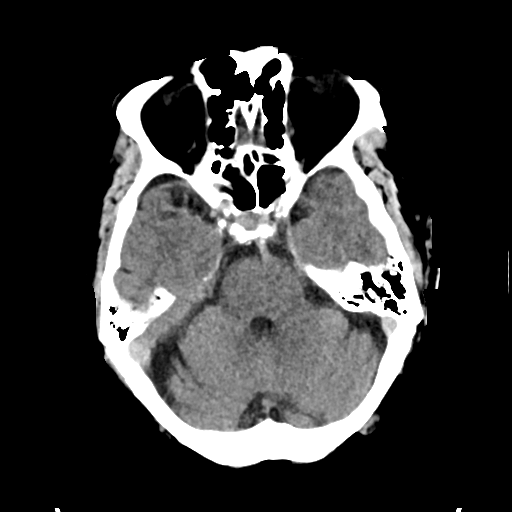
[im 13/33  brain]
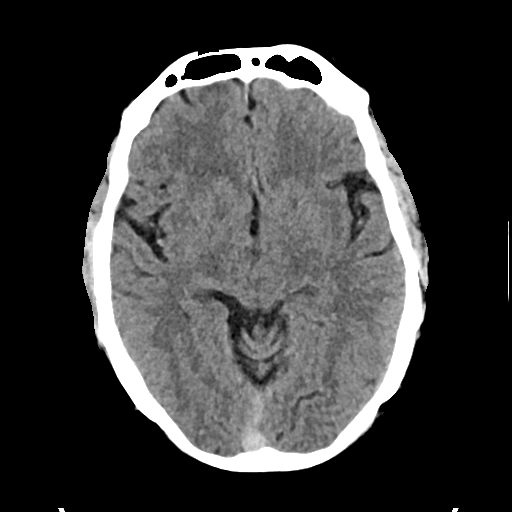
[im 17/33  brain]
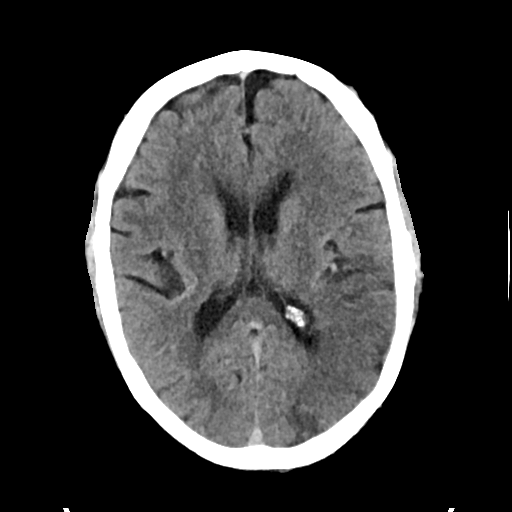
[im 21/33  brain]
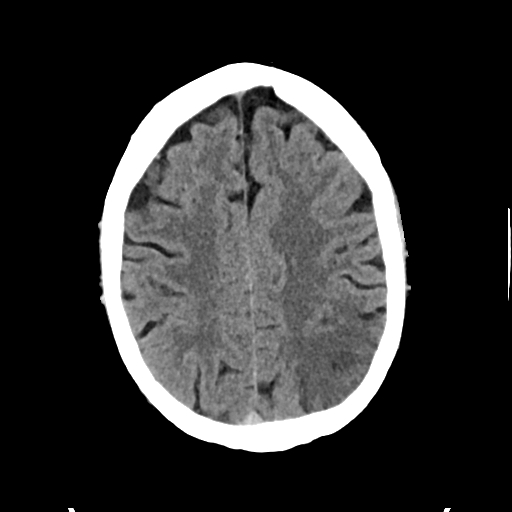
[im 21/33  bone]
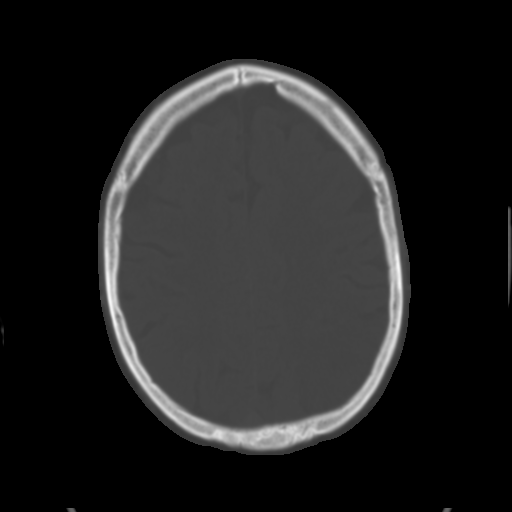
[im 25/33  brain]
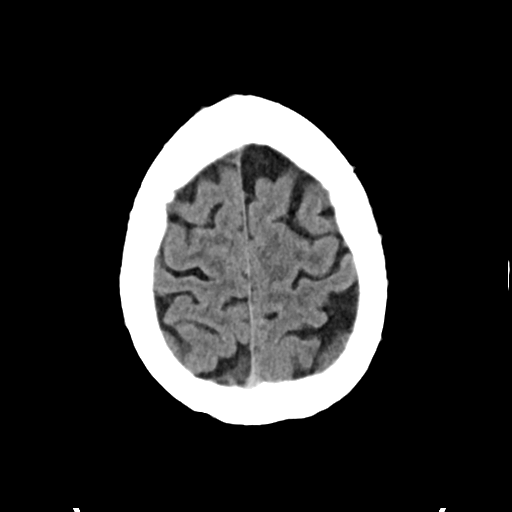
[im 29/33  brain]
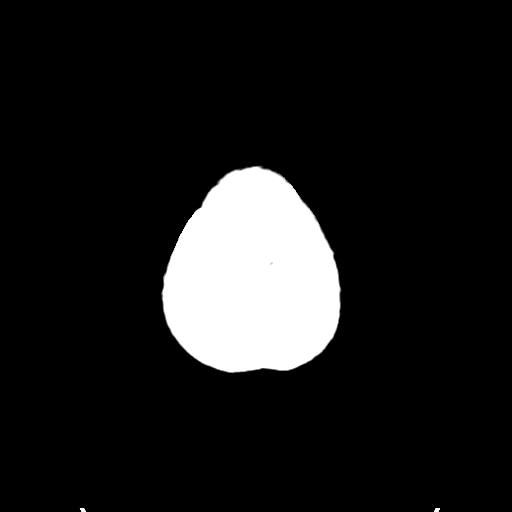

[Series 4: head bone · axial · 0.45mm/px · z∈[-72,-40]mm · 3 of 81 slices shown]
[im 9/81  bone]
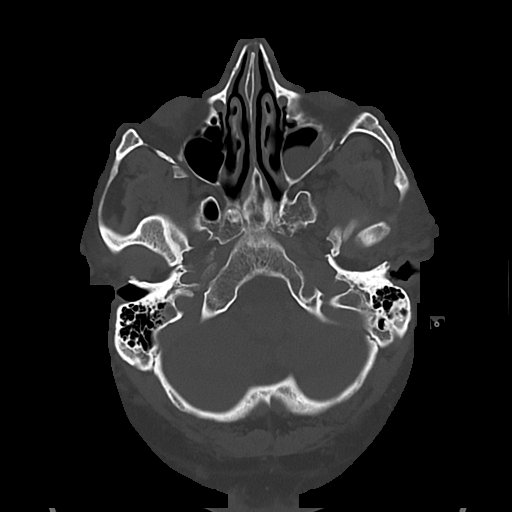
[im 17/81  bone]
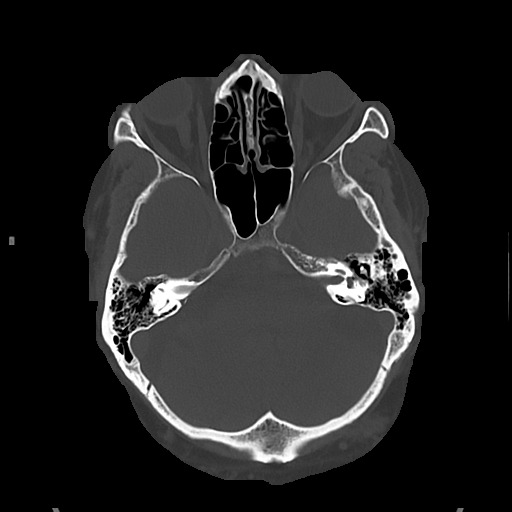
[im 25/81  bone]
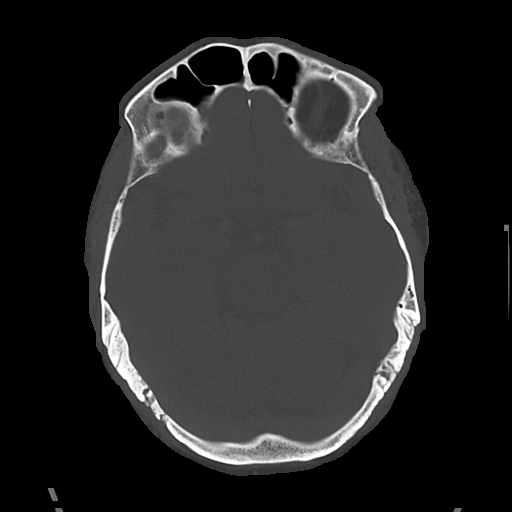

[Series 5: cor soft · coronal · 0.35mm/px · 3 of 75 slices shown]
[im 25/75  brain]
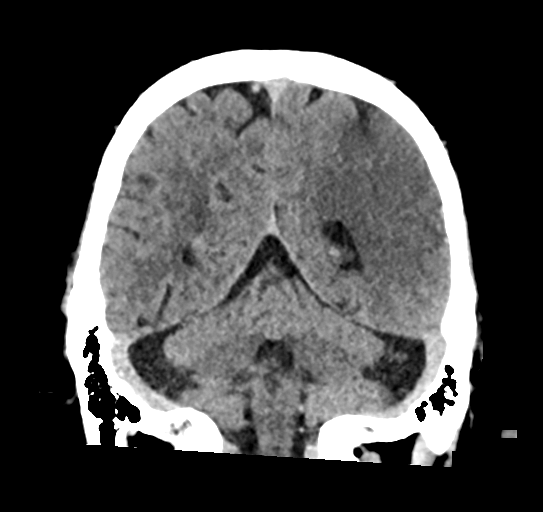
[im 33/75  brain]
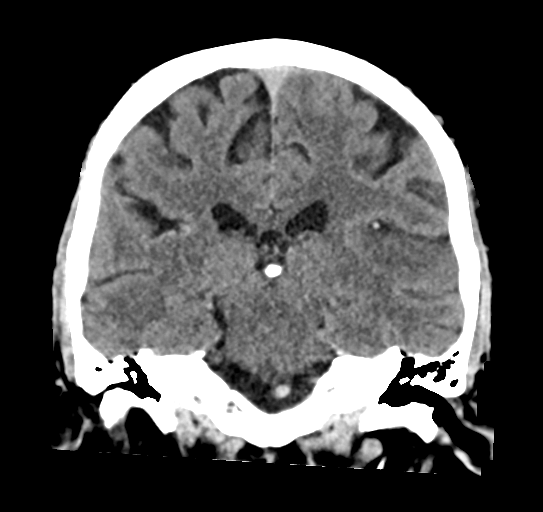
[im 42/75  brain]
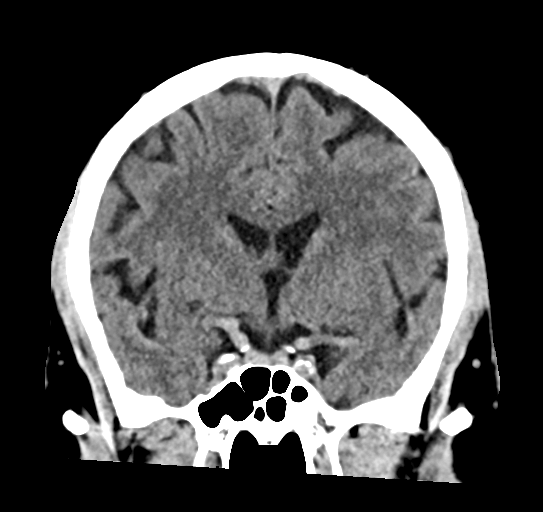

[Series 6: sag soft · sagittal · 0.35mm/px · 3 of 64 slices shown]
[im 22/64  brain]
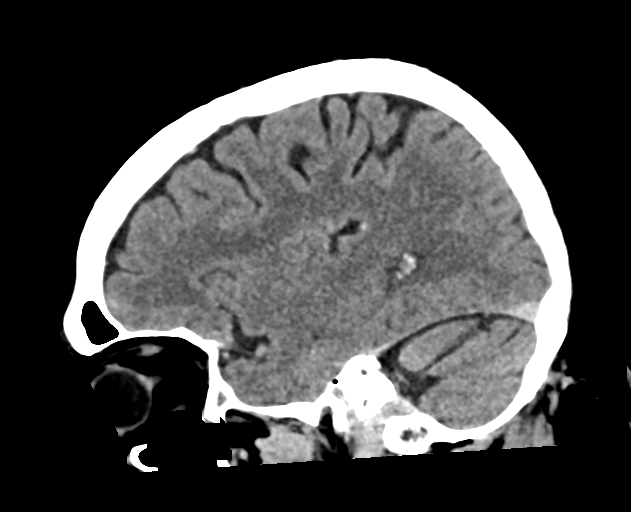
[im 32/64  brain]
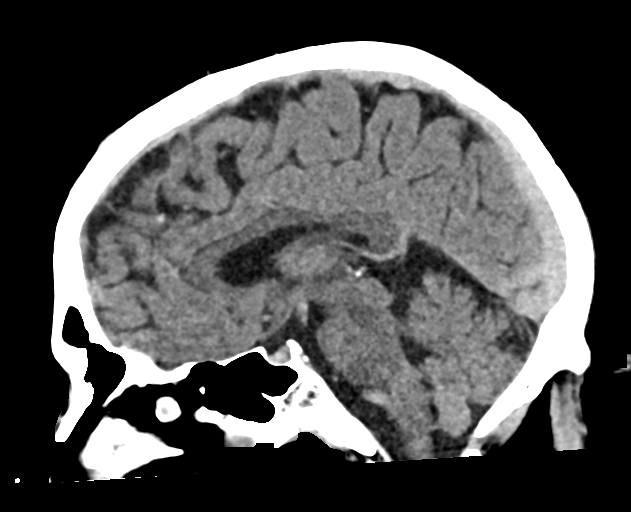
[im 43/64  brain]
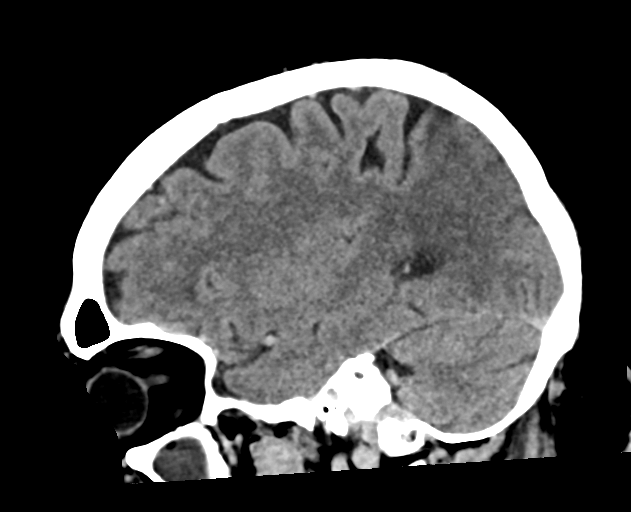

[16 of 47 positions shown; findings below may reference images not displayed]

FINDINGS: Brain: Confluent cytotoxic edema in the posterior left MCA territory
affecting the posterosuperior temporal gyrus through the left
parietal lobe. No hemorrhagic transformation. No significant mass
effect. The insula, internal capsule and deep gray matter nuclei
appear spared. Gray-white matter differentiation elsewhere within
normal limits. No ventriculomegaly. Normal basilar cisterns.

Vascular: Calcified atherosclerosis at the skull base. Suspicious
hyperdensity in a posterior left MCA branch along the anterior
margin of the cytotoxic edema series 2, image 17 and series 6, image
45. But no more proximal suspicious vessel hyperdensity is
identified.

Skull: Negative.

Sinuses/Orbits: Subtotal opacification of the left maxillary sinus
due to a mucosal thickening and/or retention cyst. Other Visualized
paranasal sinuses and mastoids are clear.

Other: Visualized orbits and scalp soft tissues are within normal
limits.

ASPECTS (Alberta Stroke Program Early CT Score)

- Ganglionic level infarction (caudate, lentiform nuclei, internal
capsule, insula, M1-M3 cortex): 6 (abnormal M3)

- Supraganglionic infarction (M4-M6 cortex): 1

Total score (0-10 with 10 being normal): 7
IMPRESSION: 1. Posterior left MCA infarct with fairly well-developed cytotoxic
edema. ASPECTS 7.
2. No hemorrhagic transformation or mass effect. Suspicion of a
posterior left MCA branch occlusion at the posterior sylvian
fissure.

Study discussed by telephone with Dr. RTOYOTA JOSHJAX on 05/07/2021

## 2022-12-18 IMAGING — XA IR PERCUTANEOUS ART THORMBECTOMY/INFUSION INTRACRANIAL INCLUDE D
1 series · 10 of 24 positions shown · IV contrast (IODINE)
Comparison: CT angiogram of the head and neck May 07, 2021.

INDICATION: New onset of dense aphasia and moderate right upper extremity
weakness.

Prominent M2 branch of the inferior division of the left MCA on the
CT angiogram of the head and neck.
EXAM:
1. EMERGENT LARGE VESSEL OCCLUSION THROMBOLYSIS (anterior
CIRCULATION)
TECHNIQUE: Following a full explanation of the procedure along with the
potential associated complications, an informed witnessed consent
was obtained. The risks of intracranial hemorrhage of 10%, worsening
neurological deficit, ventilator dependency, death and inability to
revascularize were all reviewed in detail with the patient's
daughter.

[Series 300: dr. (person_name) · 10 of 216 slices shown]
[im 10/216]
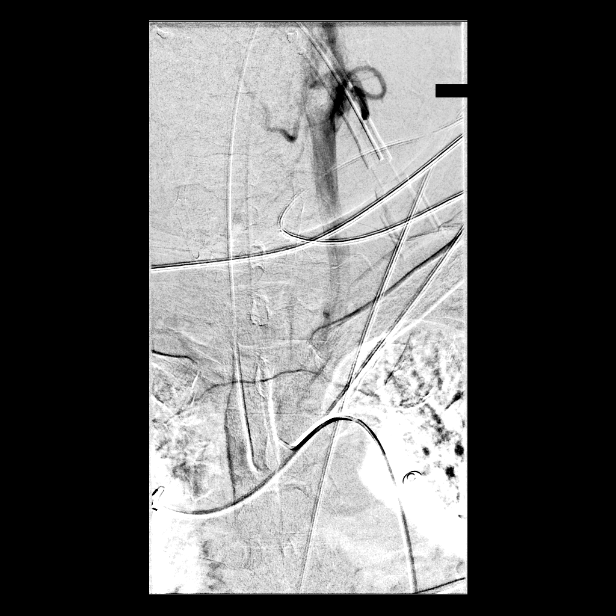
[im 29/216]
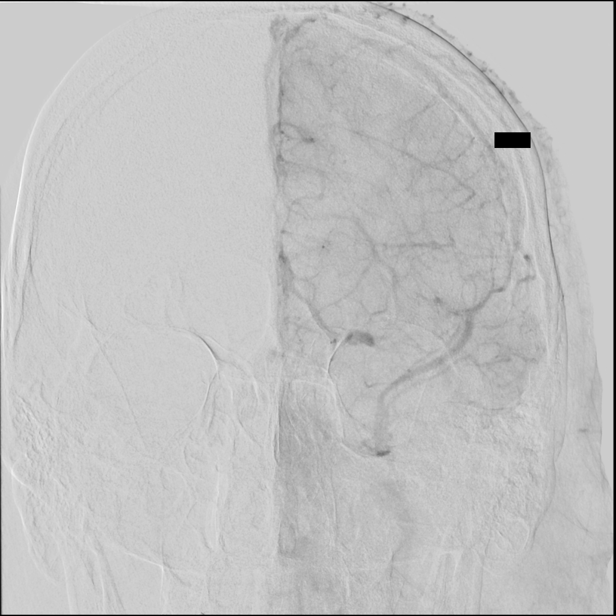
[im 57/216]
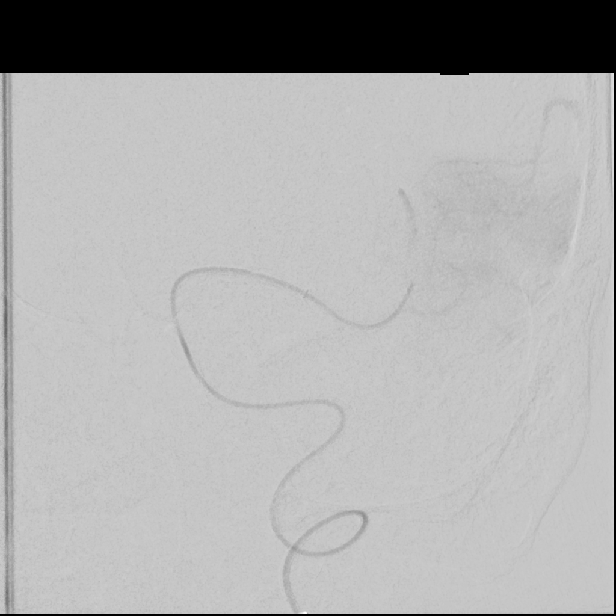
[im 75/216]
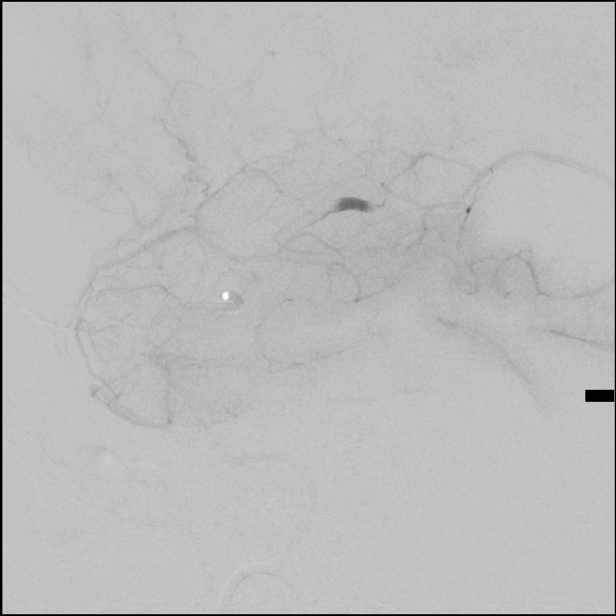
[im 94/216]
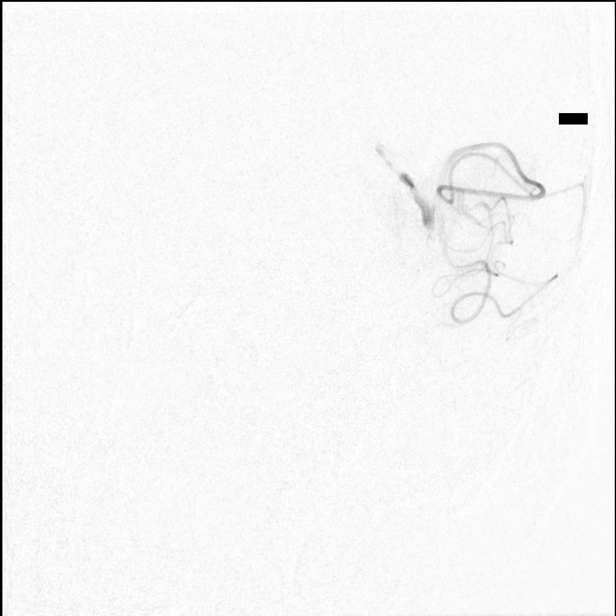
[im 122/216]
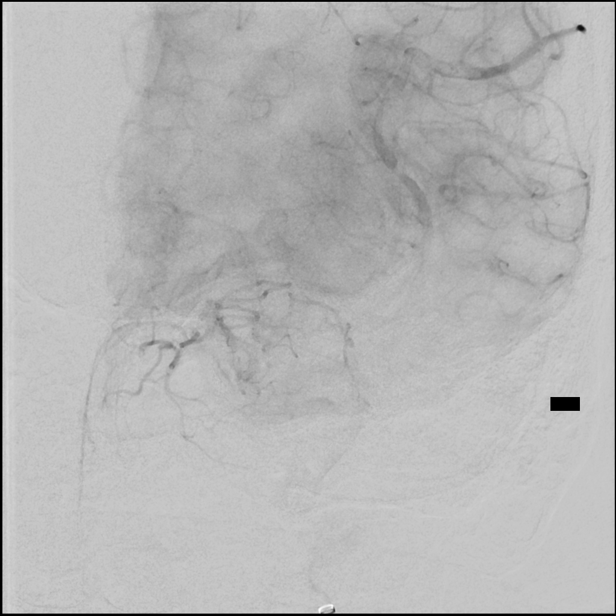
[im 141/216]
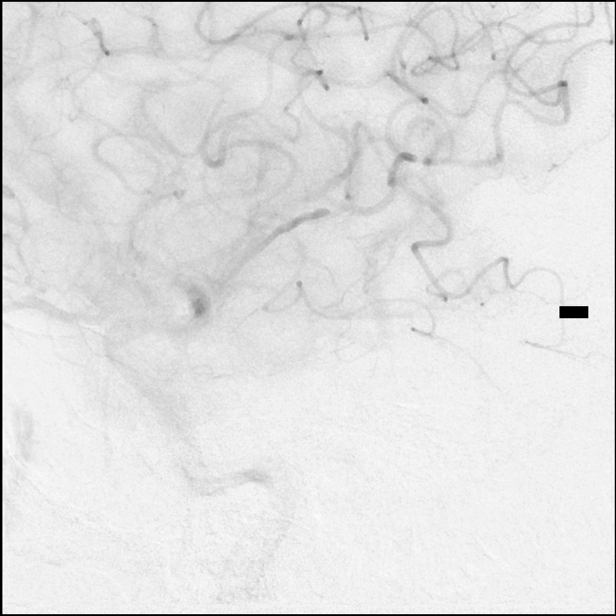
[im 169/216]
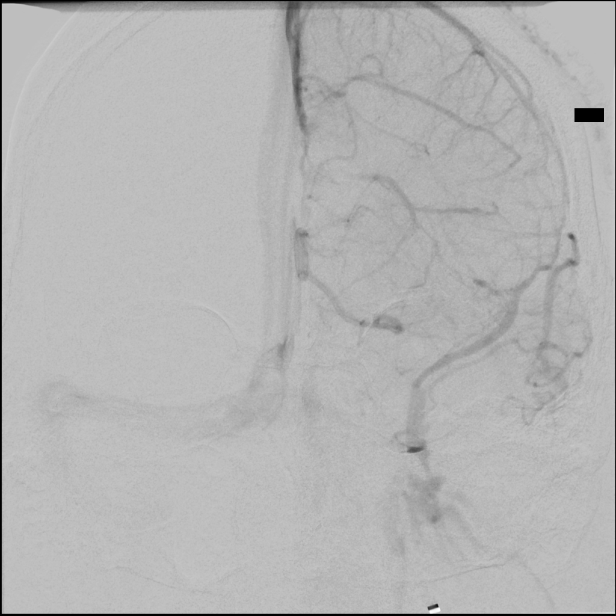
[im 187/216]
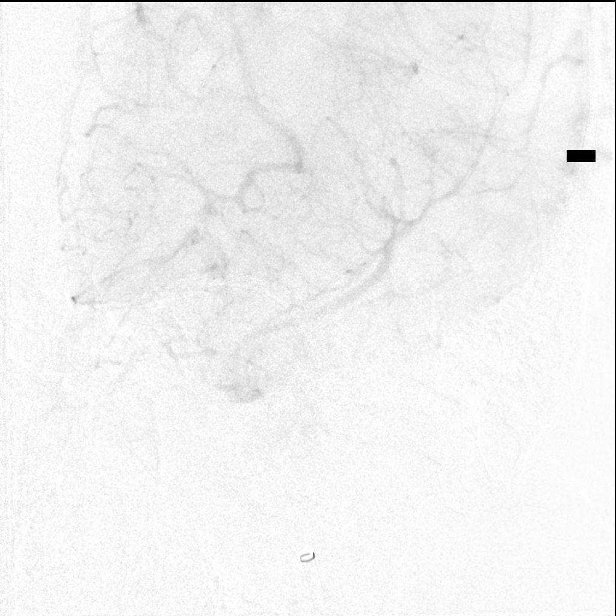
[im 206/216]
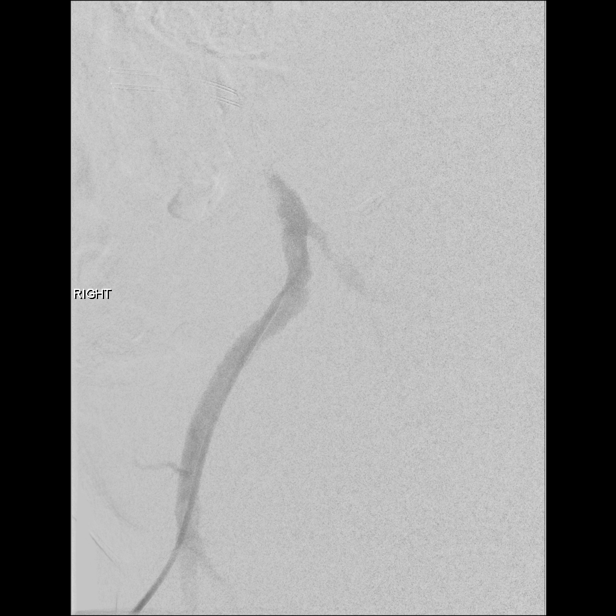

[10 of 24 positions shown; findings below may reference images not displayed]

MEDICATIONS:
Ancef 2 g IV antibiotic was administered within 1 hour of the
procedure.

ANESTHESIA/SEDATION:
General anesthesia

CONTRAST:  Isovue 300 approximately 100 mL.

FLUOROSCOPY TIME:  Fluoroscopy Time: 43 minutes 6 seconds (2862
mGy).

COMPLICATIONS:
None immediate.
The patient was then put under general anesthesia by the [REDACTED] at [HOSPITAL].

The right groin was prepped and draped in the usual sterile fashion.
Thereafter using modified Seldinger technique, transfemoral access
into the right common femoral artery was obtained without
difficulty. Over a 0.035 inch guidewire an 8 French Pinnacle 25 cm
sheath was inserted. Through this, and also over a 0.035 inch
guidewire a 5.5 Bansal [place] support catheter inside of an 087 balloon
guide catheter was advanced to the aortic arch and selectively
positioned in the left common carotid artery.

The guidewire and support catheter were removed. Good aspiration was
obtained from the hub of the balloon guide catheter.

An angiogram was then perfor[REDACTED]ed extra cranially and
intracranially.
FINDINGS: The left common carotid arteriogram demonstrates the left external
carotid artery and its major branches to be widely patent.

The left internal carotid artery at the bulb to the cranial skull
base is widely patent with mild to moderate tortuosity at the distal
cervical segment.

More distally the petrous, the cavernous and the supraclinoid
segments are widely patent.

The left middle and its branches opacify except for a distal
inferior division branch in the distal M2 M3 region.

A moderate sized area of hypoperfusion is seen involving the
parietal cortical subcortical region.

The left anterior cerebral artery opacifies into the capillary and
venous phases.

PROCEDURE:
Over a 0.014 inch standard Synchro micro guidewire a combination of
a 35 136 cm Zoom catheter inside of a 55 132 cm Zoom aspiration
catheter was advanced to the supraclinoid left ICA.

The micro guidewire was then gently advanced using a torque device
without difficulty to the occluded distal M2 M3 region of the
inferior division of the left middle cerebral artery followed by the
microcatheter. The micro guidewire was then gently manipulated with
a torque device and teased gently.

However, significant resistance was encountered to the advancement
of the micro guidewire.

The 35 Zoom catheter was then gently advanced to the occluded site.
The micro guidewire was manipulated without success through the
occluded segment. The 35 Zoom aspiration catheter was embedded in
the occlusion. Constant aspiration was applied at the hub of the 35
Zoom aspiration catheter, and also in the Penumbra aspiration device
at the hub of the 55 Zoom aspiration catheter which was advanced
more distally.

After 2-1/2 minutes, the combination of the 55 and the 35 Zoom
aspiration catheter was retrieved until there was free flow of blood
at the hub of 55 aspiration Zoom catheter.

A control arteriogram performed through the 55 Zoom aspiration
catheter demonstrated no significant change in the occluded inferior
divisions distal segment.

A second pass was made again using the above combination.

An 027 Marksman 160 cm microcatheter was advanced inside of the 55
Zoom aspiration catheter over a 0.14 inch Synchro micro guidewire as
described above without difficulty.

At this time, an 014 Aristotle guidewire was utilized. Significant
resistance was again encountered to the advancement of the micro
guidewire, and also the microcatheter which was imbedded in the
occlusion.

The micro guidewire was retrieved and removed. Again constant
aspiration was applied at the hub of the 027 microcatheter, and also
the Zoom aspiration catheter as described earlier with Penumbra
aspiration device for approximately 2 minutes. The combination was
then retrieved and removed. Control arteriogram performed through
the 55 Zoom aspiration catheter demonstrated no change in the
occlusion.

A third pass was then made at this time using the 014 inch Aristotle
micro guidewire with an 021 Trevo ProVue microcatheter. The micro
guidewire was advanced to the occluded middle cerebral artery branch
followed by the microcatheter.

Gentle rotational and to and fro slow movements were performed with
the microcatheter and micro guidewire with the micro guidewire now
penetrating more distally into the distal M3 region followed by the
microcatheter.

The guidewire was removed. The microcatheter was retrieved more
proximally until free aspiration of blood was noted just proximal to
the occlusion.

A gentle control arteriogram performed through the microcatheter now
demonstrated a focal area of irregularity with severe stenosis.
Distal to this the vessel remained occluded.

A fourth pass was then made this time again using a 35 Zoom
aspiration catheter with a 55 Zoom aspiration catheter advanced in
combination again over a micro guidewire just proximal to the pre
occlusive stenosis.

The micro guidewire was gently advanced past this followed by the 35
Zoom aspiration catheter into the mid M3 region.

The guidewire was removed. Slow aspiration obtained from the hub of
the 35 Zoom aspiration catheter.

The 55 Zoom aspiration catheter was now advanced to just proximal to
the occluded branch.

Thereafter, constant aspiration was applied at the hub of both the
catheters for approximately 2 minutes.

Following removal, a control arteriogram performed through the
balloon guide in the left internal carotid artery now demonstrated
flow through the occluded portion of the vessel with a tight focal
pre occlusive stenosis noted at this site.

A TICI 2C revascularization was noted.

Given the underlying severe stenosis probably related to
intracranial arteriosclerosis, the option of rescue a stent was
considered.

However, CT of the brain at that time demonstrated a focal area of
contrast in the subarachnoid space and possibly in the adjacent
cortical region. In view of this, and also the CT perfusion scan
demonstrating a core at this site, the potential risks of hemorrhage
with the use of dual antiplatelets was felt to outweigh the
potential benefits. The procedure was, therefore, stopped.

A control arteriogram performed through the balloon guide in the
left internal carotid artery demonstrated continued TICI 2C
revascularization.

No mass-effect or midline shift was noted. An 8 French Angio-Seal
closure device was used for hemostasis at the right groin puncture
site. Distal pulses remained present bilaterally unchanged.

The patient was then extubated. Upon recovery, the patient was very
combative and not being able to communicate due to the global
aphasia. It was decided to reintubate the patient for better control
of the patient's blood pressure for at least 24 hours.

The patient was then transferred to the PACU and then neuro ICU.
IMPRESSION: Status post endovascular revascularization of occluded branch of the
inferior division of the left middle cerebral artery, with contact
aspiration x4 with a TICI 2C revascularization with reocclusion of
the branch.

PLAN:
As per referring MEDIN SALKO.

## 2023-01-08 DIAGNOSIS — R809 Proteinuria, unspecified: Secondary | ICD-10-CM | POA: Diagnosis not present

## 2023-01-08 DIAGNOSIS — E1122 Type 2 diabetes mellitus with diabetic chronic kidney disease: Secondary | ICD-10-CM | POA: Diagnosis not present

## 2023-01-08 DIAGNOSIS — Z125 Encounter for screening for malignant neoplasm of prostate: Secondary | ICD-10-CM | POA: Diagnosis not present

## 2023-01-08 DIAGNOSIS — I10 Essential (primary) hypertension: Secondary | ICD-10-CM | POA: Diagnosis not present

## 2023-01-08 DIAGNOSIS — E782 Mixed hyperlipidemia: Secondary | ICD-10-CM | POA: Diagnosis not present

## 2023-01-08 DIAGNOSIS — E1129 Type 2 diabetes mellitus with other diabetic kidney complication: Secondary | ICD-10-CM | POA: Diagnosis not present

## 2023-01-08 DIAGNOSIS — N1831 Chronic kidney disease, stage 3a: Secondary | ICD-10-CM | POA: Diagnosis not present

## 2023-01-15 DIAGNOSIS — G4733 Obstructive sleep apnea (adult) (pediatric): Secondary | ICD-10-CM | POA: Diagnosis not present

## 2023-01-15 DIAGNOSIS — E1129 Type 2 diabetes mellitus with other diabetic kidney complication: Secondary | ICD-10-CM | POA: Diagnosis not present

## 2023-01-15 DIAGNOSIS — I63312 Cerebral infarction due to thrombosis of left middle cerebral artery: Secondary | ICD-10-CM | POA: Diagnosis not present

## 2023-01-15 DIAGNOSIS — I10 Essential (primary) hypertension: Secondary | ICD-10-CM | POA: Diagnosis not present

## 2023-01-15 DIAGNOSIS — J439 Emphysema, unspecified: Secondary | ICD-10-CM | POA: Diagnosis not present

## 2023-01-15 DIAGNOSIS — I69320 Aphasia following cerebral infarction: Secondary | ICD-10-CM | POA: Diagnosis not present

## 2023-01-15 DIAGNOSIS — E1122 Type 2 diabetes mellitus with diabetic chronic kidney disease: Secondary | ICD-10-CM | POA: Diagnosis not present

## 2023-01-15 DIAGNOSIS — I63512 Cerebral infarction due to unspecified occlusion or stenosis of left middle cerebral artery: Secondary | ICD-10-CM | POA: Diagnosis not present

## 2023-01-15 DIAGNOSIS — Z1331 Encounter for screening for depression: Secondary | ICD-10-CM | POA: Diagnosis not present

## 2023-01-15 DIAGNOSIS — Z0000 Encounter for general adult medical examination without abnormal findings: Secondary | ICD-10-CM | POA: Diagnosis not present

## 2023-01-15 DIAGNOSIS — Z87891 Personal history of nicotine dependence: Secondary | ICD-10-CM | POA: Diagnosis not present

## 2023-01-15 DIAGNOSIS — E782 Mixed hyperlipidemia: Secondary | ICD-10-CM | POA: Diagnosis not present

## 2023-02-07 ENCOUNTER — Encounter: Payer: Self-pay | Admitting: Speech Pathology

## 2023-02-07 ENCOUNTER — Ambulatory Visit: Payer: 59 | Attending: Student | Admitting: Speech Pathology

## 2023-02-07 DIAGNOSIS — R4701 Aphasia: Secondary | ICD-10-CM

## 2023-02-07 DIAGNOSIS — I639 Cerebral infarction, unspecified: Secondary | ICD-10-CM

## 2023-02-07 DIAGNOSIS — I69320 Aphasia following cerebral infarction: Secondary | ICD-10-CM | POA: Diagnosis not present

## 2023-02-07 DIAGNOSIS — I63312 Cerebral infarction due to thrombosis of left middle cerebral artery: Secondary | ICD-10-CM | POA: Diagnosis not present

## 2023-02-07 NOTE — Therapy (Signed)
OUTPATIENT SPEECH LANGUAGE PATHOLOGY APHASIA EVALUATION   Patient Name: Peter Lucas MRN: [id number] DOB:17-Dec-1965, 57 y.o., male Today's Date: 02/07/2023  PCP: Wayland Denis, PA REFERRING PROVIDER: Wayland Denis, PA   End of Session - 02/07/23 1019     Visit Number 1    Number of Visits 10    Date for SLP Re-Evaluation 05/02/23    Authorization Type Aetna/A[employer]na [facility] QHP    Authorization - Visit Number 1    Authorization - Number of Visits 30    Progress Note Due on Visit 10    SLP Start Time 0900    SLP Stop Time  1000    SLP Time Calculation (min) 60 min    Activity Tolerance Patient tolerated treatment well             Past Medical History:  Diagnosis Date   DM (diabetes mellitus) ([place])    Hyperlipidemia    Hypertension    Past Surgical History:  Procedure Laterality Date   BACK SURGERY     cyst removal   IR CT HEAD LTD  05/07/2021   IR PERCUTANEOUS ART THROMBECTOMY/INFUSION INTRACRANIAL INC DIAG ANGIO  05/07/2021   RADIOLOGY WITH ANESTHESIA N/A 05/07/2021   Procedure: IR WITH ANESTHESIA;  Surgeon: Radiologist, Medication, MD;  Location: [facility];  Service: Radiology;  Laterality: N/A;   Patient Active Problem List   Diagnosis Date Noted   Aphasia as late effect of stroke 09/15/2022   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 ([place]) 05/12/2021   Stroke ([place]) 05/07/2021   Acute ischemic left MCA stroke ([place]) 05/07/2021   Middle cerebral artery embolism, left 05/07/2021    ONSET DATE: 05/07/2022   REFERRING DIAG:  R47.01 (ICD-10-CM) - Aphasia  I69.320 (ICD-10-CM) - Aphasia following cerebral infarction  I63.312 (ICD-10-CM) - Cerebral infarction due to thrombosis of left middle cerebral artery    THERAPY DIAG:  Aphasia  Cerebrovascular accident (CVA) due to thrombosis of left middle cerebral artery ([place])  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 ([facility])  Aphasia as late effect of stroke  Rationale for Evaluation and  Treatment Rehabilitation  SUBJECTIVE:  SUBJECTIVE STATEMENT: Pt and his daughter known to this Probation officer from previous course of ST Pt accompanied by: family member  PERTINENT HISTORY: Jayln Madeira is a 57 year old right-handed male with unremarkable past medical history. Presented 05/07/2021 with acute onset of aphasia and right side weakness. Large, confluent acute posterior left MCA territory infarct with smaller acute infarcts scattered throughout the left frontotemporal lobes and left insular region. Associated edema and regional mass effect without midline shift. Areas of petechial hemorrhage along the posteromedial aspect of the confluent hemorrhage. Patient underwent thrombectomy per interventional radiology. Latest cranial CT scan showed more pronounced low density within the left MCA branch vessel infarction. CIR 05/13/2021 thru 05/26/2021.   Pt received Outpatient ST services from 08/10/2021 thru 07/10/2022.    DIAGNOSTIC FINDINGS:   01/31/2022 MRI Prior multifocal left MCA infarct. Confluent encephalomalacia in the posterior left MCA territory affecting most of the left parietal lobe and some portions of the left occipital lobe in the MCA/PCA watershed area. Some associated hemosiderin. And associated laminar necrosis, which is felt to explain the presence of some residual trace diffusion abnormality within the encephalomalacia.   PAIN:  Are you having pain? No  FALLS: Has patient fallen in last 6 months?  No  LIVING ENVIRONMENT: Lives with: lives alone Lives in: House/apartment  PLOF:  Level of assistance: Independent with ADLs, Independent with  IADLs Employment: On disability   PATIENT GOALS to be able to communicate better  OBJECTIVE:   COGNITION: Overall cognitive status: Difficulty to assess due to: Communication impairment  AUDITORY COMPREHENSION: Overall auditory comprehension: Impaired: moderately complex YES/NO questions: Impaired: moderately  complex Following directions: Impaired: moderately complex Conversation: Simple Interfering components: motor planning Effective technique: repetition/stressing words, written cues, stressing words, and visual/gestural cues   READING COMPREHENSION: Impaired: phrase  EXPRESSION: verbal  VERBAL EXPRESSION: Overall verbal expression: Impaired: moderately complex Level of generative/spontaneous verbalization: phrase Automatic speech: name: intact and social response: intact  Repetition: Impaired: Word Naming: Responsive: 26-50%, Confrontation: 26-50%, Convergent: 26-50%, and Divergent: 26-50% Pragmatics: Appears intact Interfering components:  N/A Effective technique: open ended questions, semantic cues, sentence completion, and written cues Non-verbal means of communication: N/A  WRITTEN EXPRESSION: Dominant hand: right  Written expression: Impaired: word  MOTOR SPEECH: Overall motor speech: Appears intact Level of impairment: N/A Respiration: thoracic breathing Phonation: normal Resonance: WFL Articulation: Appears intact Intelligibility: Intelligibility reduced Motor planning: Impaired: aware, unaware, groping for words, and inconsistent Motor speech errors: aware, unaware, groping for words, and inconsistent Interfering components:  N/A Effective technique:  N/A   ORAL MOTOR EXAMINATION Facial : WFL Lingual: WFL Velum: WFL Mandible: WFL Cough: WFL Voice: WFL   STANDARDIZED ASSESSMENTS:   Western Aphasia Battery- Revised  Spontaneous Speech                           Information content               3/10                                            Fluency                                 4/10                                          Comprehension     Yes/No questions                 48/60                                           Auditory Word Recognition  31/60                                     PATIENT REPORTED OUTCOME MEASURES (PROM):   AIQ-21 Aphasia  Impact Questionnaire  Summary Score Sheet  The AIQ is a tool that enables people with aphasia to express their views and experience of aphasia. It enables the administrator and person with aphasia to explore and rate the impact of acquiring and living with aphasia. The AIQ-21 enables the person with aphasia to do this with or without the use of spoken output.     The AIQ-21 consists of 21 items: 6 items in the Communication domain, 7 items in the Participation domain, and 11 items in the Emotional state/Well-being  domain. Total scores range from 0 to 84, with higher scores indicating higher impact of aphasia (0 = no problem to 4 = impossible).      COMMUNICATION   Patient's Response   How easy was it to talk to someone close to you?   3    2. How easy was it for you to talk to a stranger?   2   3. How easy was it for you to understand someone close to you?   3   4. How easy was it for you to understand a stranger?   3   5. How easy was it for you to write a letter to a friend?   4   62. How easy was it for you to read a whole story in the newspaper?   4    PARTICIPATION     7. How easy was it for you to do the things you have to do?   4   8. Did you have enough positive things to do -  things you wanted to do?   3   9. How were things with friends?   0   10. How were  things with your family?  0   EMOTIONAL STATE/WELLBEING    11. Have you felt frustrated?   4   12. Have you felt worried?   4   13. Have you felt unhappy?   1   14. Have you felt helpless?   2   15. Have you felt bored?   4   16. Have you felt embarrassed?   3   17. Have you felt angry?   1   18. Have you felt isolated?   1   19. Some people  tell us they  feel stupid.  I know  you are not stupid.  But this week..... have you felt stupid?    4   20. Have you felt confident? * = descriptors reversed within the AIQ scale    2   21. How do you feel about  the future? * = descriptors reversed within the AIQ scale    2   Total AIQ Score    54     PATIENT EDUCATION: Education details: results of this assessment; ST POC Person educated: Patient and Child(ren) Education method: Explanation Education comprehension: needs further education   GOALS: Goals reviewed with patient? Yes  SHORT TERM GOALS: Target date: 10 sessions  Pt will respond to general questions using AAC/nonverbal communication effectively with _50_% accuracy and max cues. Baseline: Goal status: INITIAL  2.  Pt will point to the given picture in a choice of 3 with _50_% accuracy given minimal verbal, visual and tactile cues.   Baseline:  Goal status: INITIAL  3.  Pt will use AAC to communicate within group situations with _50_% accuracy given moderate cues.   Baseline:  Goal status: INITIAL  4.  Pt will use AAC to communicate in a structured conversation setting with _50__% accuracy and mod cues.    Baseline:  Goal status: INITIAL   LONG TERM GOALS: Target date: 05/02/2023  Pt will use multi-modal communication to communicate semi-complex wants/needs.  Baseline:  Goal status: INITIAL   ASSESSMENT:  CLINICAL IMPRESSION: Patient is a 57 y.o. male who was seen today for an aphasia evaluation. Portions of the Western Aphasia Battery were completed. Will complete assessment during new appointment.   OBJECTIVE IMPAIRMENTS include expressive language, receptive language, aphasia, and apraxia. These impairments  are limiting patient from return to work, managing appointments, household responsibilities, ADLs/IADLs, and effectively communicating at home and in community. Factors affecting potential to achieve goals and functional outcome are severity of impairments and time post onset . Patient will benefit from skilled SLP services to address above impairments and improve overall function.  REHAB POTENTIAL: Good  PLAN: SLP FREQUENCY: 1-2x/week  SLP  DURATION: 12 weeks  PLANNED INTERVENTIONS: Language facilitation, Environmental controls, Multimodal communication approach, SLP instruction and feedback, and Patient/family education    Zurii Hewes B. Rutherford Nail, M.S., [name], Mining engineer Certified Brain Injury [place]  [place] Office [phone] Ascom [phone] Fax [phone]

## 2023-02-12 DIAGNOSIS — N1831 Chronic kidney disease, stage 3a: Secondary | ICD-10-CM | POA: Diagnosis not present

## 2023-02-12 DIAGNOSIS — Z6841 Body Mass Index (BMI) 40.0 and over, adult: Secondary | ICD-10-CM | POA: Diagnosis not present

## 2023-02-12 DIAGNOSIS — I10 Essential (primary) hypertension: Secondary | ICD-10-CM | POA: Diagnosis not present

## 2023-02-12 DIAGNOSIS — E1122 Type 2 diabetes mellitus with diabetic chronic kidney disease: Secondary | ICD-10-CM | POA: Diagnosis not present

## 2023-02-12 DIAGNOSIS — E782 Mixed hyperlipidemia: Secondary | ICD-10-CM | POA: Diagnosis not present

## 2023-02-12 DIAGNOSIS — R809 Proteinuria, unspecified: Secondary | ICD-10-CM | POA: Diagnosis not present

## 2023-02-12 DIAGNOSIS — E1129 Type 2 diabetes mellitus with other diabetic kidney complication: Secondary | ICD-10-CM | POA: Diagnosis not present

## 2023-02-15 ENCOUNTER — Ambulatory Visit: Payer: 59 | Admitting: Speech Pathology

## 2023-02-15 DIAGNOSIS — I69320 Aphasia following cerebral infarction: Secondary | ICD-10-CM | POA: Diagnosis not present

## 2023-02-15 DIAGNOSIS — R4701 Aphasia: Secondary | ICD-10-CM

## 2023-02-15 DIAGNOSIS — I639 Cerebral infarction, unspecified: Secondary | ICD-10-CM | POA: Diagnosis not present

## 2023-02-15 DIAGNOSIS — I63312 Cerebral infarction due to thrombosis of left middle cerebral artery: Secondary | ICD-10-CM

## 2023-02-15 NOTE — Therapy (Signed)
OUTPATIENT SPEECH LANGUAGE PATHOLOGY TREATMENT NOTE   Patient Name: Peter Lucas [place] MRN: [id number] DOB:1966-01-04, 57 y.o., male Today's Date: 02/15/2023  PCP: Wayland Denis, PA REFERRING PROVIDER: Wayland Denis, PA  END OF SESSION:   End of Session - 02/15/23 0909     Visit Number 2    Number of Visits 10    Date for SLP Re-Evaluation 05/02/23    Authorization Type Aetna/A[employer]na [employer] QHP    Authorization - Visit Number 2    Authorization - Number of Visits 30    Progress Note Due on Visit 10    SLP Start Time 0905    SLP Stop Time  1000    SLP Time Calculation (min) 55 min    Activity Tolerance Patient tolerated treatment well             Past Medical History:  Diagnosis Date   DM (diabetes mellitus) ([place])    Hyperlipidemia    Hypertension    Past Surgical History:  Procedure Laterality Date   BACK SURGERY     cyst removal   IR CT HEAD LTD  05/07/2021   IR PERCUTANEOUS ART THROMBECTOMY/INFUSION INTRACRANIAL INC DIAG ANGIO  05/07/2021   RADIOLOGY WITH ANESTHESIA N/A 05/07/2021   Procedure: IR WITH ANESTHESIA;  Surgeon: Radiologist, Medication, MD;  Location: [facility];  Service: Radiology;  Laterality: N/A;   Patient Active Problem List   Diagnosis Date Noted   Aphasia as late effect of stroke 09/15/2022   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 ([place]) 05/12/2021   Stroke ([place]) 05/07/2021   Acute ischemic left MCA stroke ([place]) 05/07/2021   Middle cerebral artery embolism, left 05/07/2021    ONSET DATE: 05/07/2022    REFERRING DIAG:  R47.01 (ICD-10-CM) - Aphasia  I69.320 (ICD-10-CM) - Aphasia following cerebral infarction  I63.312 (ICD-10-CM) - Cerebral infarction due to thrombosis of left middle cerebral artery      PERTINENT HISTORY: Kree Prinz is a 57 year old right-handed male with unremarkable past medical history. Presented 05/07/2021 with acute onset of aphasia and right side weakness. Large, confluent acute posterior left MCA  territory infarct with smaller acute infarcts scattered throughout the left frontotemporal lobes and left insular region. Associated edema and regional mass effect without midline shift. Areas of petechial hemorrhage along the posteromedial aspect of the confluent hemorrhage. Patient underwent thrombectomy per interventional radiology. Latest cranial CT scan showed more pronounced low density within the left MCA branch vessel infarction. CIR 05/13/2021 thru 05/26/2021.    Pt received Outpatient ST services from 08/10/2021 thru 07/10/2022.    DIAGNOSTIC FINDINGS:    01/31/2022 MRI Prior multifocal left MCA infarct. Confluent encephalomalacia in the posterior left MCA territory affecting most of the left parietal lobe and some portions of the left occipital lobe in the MCA/PCA watershed area. Some associated hemosiderin. And associated laminar necrosis, which is felt to explain the presence of some residual trace diffusion abnormality within the encephalomalacia.  THERAPY DIAG:  Aphasia  Cerebrovascular accident (CVA) due to thrombosis of left middle cerebral artery ([facility])  Aphasia as late effect of stroke  Rationale for Evaluation and Treatment Rehabilitation  SUBJECTIVE: pt pleasant, eager; accompanied by his sister  Pt accompanied by: family member  PAIN:  Are you having pain? No  PATIENT GOALS: to be able to communicate better  OBJECTIVE:   TODAY'S TREATMENT: Skilled treatment session focused on completed pt's formal language assessment.   Remaining portions of the Western Aphasia Battery were administered. Sequential Commands 4/80 Repetition 10/100  Naming Object Naming 29/60 Word Fluency 0/20 Sentence Completion 0/10 Responsive Speech 0/10  Aphasia Quotient 30.1/100    Pt's severity rating was severe as indicated by an Aphasia Quotient of 30.1 (0-25=very severe, 26-50=severe, 51-75=moderate, 76 and above is mild). Pt's presentation is most consistent with Wernicke's  subtype, characterized by moderate verbal expression, moderate auditory comprehension, and moderate repetition. Pt's verbal expression is characterized by 1-2 word length utterances, semantic paraphasias.    PATIENT EDUCATION: Education details: see above Person educated: Patient and pt's sister (Peter Lucas) Education method: Explanation Education comprehension: needs further education  HOME EXERCISE PROGRAM:    GOALS: Goals reviewed with patient? Yes   SHORT TERM GOALS: Target date: 10 sessions   Pt will respond to general questions using AAC/nonverbal communication effectively with _50_% accuracy and max cues. Baseline: Goal status: INITIAL   2.  Pt will point to the given picture in a choice of 3 with _50_% accuracy given minimal verbal, visual and tactile cues.   Baseline:  Goal status: INITIAL   3.  Pt will use AAC to communicate within group situations with _50_% accuracy given moderate cues.   Baseline:  Goal status: INITIAL   4.  Pt will use AAC to communicate in a structured conversation setting with _50__% accuracy and mod cues.     Baseline:  Goal status: INITIAL     LONG TERM GOALS: Target date: 05/02/2023   Pt will use multi-modal communication to communicate semi-complex wants/needs.  Baseline:  Goal status: INITIAL  ASSESSMENT:  CLINICAL IMPRESSION: Pt is 57 year old male presenting with aphasia characterized by expressive and receptive language deficits. Pt scored below normal limits on the standardized assessment given today, falling within characterization of Wernicke's aphasia. Pt continues to struggle with poor comprehension, leading to a guarded prognosis for AAC as pt is not likely to understand the transactional process behind the form of communication.   OBJECTIVE IMPAIRMENTS include expressive language, receptive language, aphasia, and apraxia. These impairments are limiting patient from return to work, managing appointments, household  responsibilities, ADLs/IADLs, and effectively communicating at home and in community. Factors affecting potential to achieve goals and functional outcome are severity of impairments and time post onset . Patient will benefit from skilled SLP services to address above impairments and improve overall function.  REHAB POTENTIAL: Good  PLAN: SLP FREQUENCY: 1-2x/week  SLP DURATION: 12 weeks  PLANNED INTERVENTIONS: Language facilitation, Environmental controls, Multimodal communication approach, SLP instruction and feedback, and Patient/family education   Antrell Tipler B. Rutherford Nail, M.S., [name], Mining engineer Certified Brain Injury [place]  [place] Office [phone] Ascom [phone] Fax [phone]

## 2023-02-21 ENCOUNTER — Ambulatory Visit: Payer: 59 | Admitting: Speech Pathology

## 2023-02-21 DIAGNOSIS — I69320 Aphasia following cerebral infarction: Secondary | ICD-10-CM | POA: Diagnosis not present

## 2023-02-21 DIAGNOSIS — R4701 Aphasia: Secondary | ICD-10-CM | POA: Diagnosis not present

## 2023-02-21 DIAGNOSIS — I639 Cerebral infarction, unspecified: Secondary | ICD-10-CM | POA: Diagnosis not present

## 2023-02-21 DIAGNOSIS — I63312 Cerebral infarction due to thrombosis of left middle cerebral artery: Secondary | ICD-10-CM | POA: Diagnosis not present

## 2023-02-21 NOTE — Therapy (Signed)
OUTPATIENT SPEECH LANGUAGE PATHOLOGY TREATMENT NOTE   Patient Name: Peter Lucas MRN: [id number] DOB:07/26/1966, 57 y.o., male Today's Date: 02/21/2023  PCP: Wayland Denis, PA REFERRING PROVIDER: Wayland Denis, PA  END OF SESSION:   End of Session - 02/21/23 0933     Visit Number 3    Number of Visits 10    Date for SLP Re-Evaluation 05/02/23    Authorization Type Aetna/A[employer]na [employer] QHP    Authorization - Visit Number 3    Authorization - Number of Visits 30    Progress Note Due on Visit 10    SLP Start Time 0900    SLP Stop Time  0930    SLP Time Calculation (min) 30 min    Activity Tolerance Patient tolerated treatment well             Past Medical History:  Diagnosis Date   DM (diabetes mellitus) ([place])    Hyperlipidemia    Hypertension    Past Surgical History:  Procedure Laterality Date   BACK SURGERY     cyst removal   IR CT HEAD LTD  05/07/2021   IR PERCUTANEOUS ART THROMBECTOMY/INFUSION INTRACRANIAL INC DIAG ANGIO  05/07/2021   RADIOLOGY WITH ANESTHESIA N/A 05/07/2021   Procedure: IR WITH ANESTHESIA;  Surgeon: Radiologist, Medication, MD;  Location: [facility];  Service: Radiology;  Laterality: N/A;   Patient Active Problem List   Diagnosis Date Noted   Aphasia as late effect of stroke 09/15/2022   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 ([place]) 05/12/2021   Stroke ([place]) 05/07/2021   Acute ischemic left MCA stroke ([place]) 05/07/2021   Middle cerebral artery embolism, left 05/07/2021    ONSET DATE: 05/07/2022    REFERRING DIAG:  R47.01 (ICD-10-CM) - Aphasia  I69.320 (ICD-10-CM) - Aphasia following cerebral infarction  I63.312 (ICD-10-CM) - Cerebral infarction due to thrombosis of left middle cerebral artery      PERTINENT HISTORY: Peter Lucas is a 57 year old right-handed male with unremarkable past medical history. Presented 05/07/2021 with acute onset of aphasia and right side weakness. Large, confluent acute posterior left MCA  territory infarct with smaller acute infarcts scattered throughout the left frontotemporal lobes and left insular region. Associated edema and regional mass effect without midline shift. Areas of petechial hemorrhage along the posteromedial aspect of the confluent hemorrhage. Patient underwent thrombectomy per interventional radiology. Latest cranial CT scan showed more pronounced low density within the left MCA branch vessel infarction. CIR 05/13/2021 thru 05/26/2021.    Pt received Outpatient ST services from 08/10/2021 thru 07/10/2022.    DIAGNOSTIC FINDINGS:    01/31/2022 MRI Prior multifocal left MCA infarct. Confluent encephalomalacia in the posterior left MCA territory affecting most of the left parietal lobe and some portions of the left occipital lobe in the MCA/PCA watershed area. Some associated hemosiderin. And associated laminar necrosis, which is felt to explain the presence of some residual trace diffusion abnormality within the encephalomalacia.  THERAPY DIAG:  Aphasia  Rationale for Evaluation and Treatment Rehabilitation  SUBJECTIVE: pt pleasant, demonstrated frustration at lack of understanding during today's session.   Pt accompanied by: family member  PAIN:  Are you having pain? No  PATIENT GOALS: to be able to communicate better  OBJECTIVE:   TODAY'S TREATMENT: Skilled treatment session focused on pt's expressive and receptive language deficits:   SLP facilitated conversation regarding pt currently at chronic baseline c/b moderate predominate Wernicke's aphasia. Further skilled ST intervention is not likely to change pt's functional language abilities. Pt  voiced understanding.   In an effort to promote language use, SLP provided verbal and written information about the [employer] (TAP). Flyer provided with website shown to pt. Pt appeared to recognize the website and it appears that his HHST at initial discharge from hospital might have mentioned  this to him. Despite several attempts at introducing the program and all that it offers, pt remained confused and mildly resistant to further exploring d/t level of cognitive communication impairment.   To aid in this confusion, written information was given to pt to review with his daughter. SLP further made plan to use future ST session for registering and attending virtual classes to teach patient about the program.    SLP will also reach out to pt's daughter to ensure understanding of the above plan.   PATIENT EDUCATION: Education details: see above Person educated: Patient and pt's sister ([name]) Education method: Explanation Education comprehension: needs further education  HOME EXERCISE PROGRAM:  Give information to his daughter on TAP   GOALS: Goals reviewed with patient? Yes   SHORT TERM GOALS: Target date: 10 sessions   Pt will respond to general questions using AAC/nonverbal communication effectively with _50_% accuracy and max cues. Baseline: Goal status: INITIAL   2.  Pt will point to the given picture in a choice of 3 with _50_% accuracy given minimal verbal, visual and tactile cues.   Baseline:  Goal status: INITIAL   3.  Pt will use AAC to communicate within group situations with _50_% accuracy given moderate cues.   Baseline:  Goal status: INITIAL   4.  Pt will use AAC to communicate in a structured conversation setting with _50__% accuracy and mod cues.     Baseline:  Goal status: INITIAL     LONG TERM GOALS: Target date: 05/02/2023   Pt will use multi-modal communication to communicate semi-complex wants/needs.  Baseline:  Goal status: INITIAL  ASSESSMENT:  CLINICAL IMPRESSION: Pt presents with what is now considered chronic aphasia and structured instruction in receptive/expressive tasks is not likely to have a functional change in language abilities. Pt's language impairment is such that using a low-tech or high-tech device is more frustrating  than helpful. Will continue recommending additional services to promote services such as TAP. See above.   OBJECTIVE IMPAIRMENTS include expressive language, receptive language, aphasia, and apraxia. These impairments are limiting patient from return to work, managing appointments, household responsibilities, ADLs/IADLs, and effectively communicating at home and in community. Factors affecting potential to achieve goals and functional outcome are severity of impairments and time post onset . Patient will benefit from skilled SLP services to address above impairments and improve overall function.  REHAB POTENTIAL: Good  PLAN: SLP FREQUENCY: 1-2x/week  SLP DURATION: 12 weeks  PLANNED INTERVENTIONS: Language facilitation, Environmental controls, Multimodal communication approach, SLP instruction and feedback, and Patient/family education    Alphonzo Grieve, SLP Graduate Clinician    Happi B. Rutherford Nail, M.S., [name], Mining engineer Certified Brain Injury [place]  [place] Office [phone] Ascom [phone] Fax [phone]

## 2023-03-08 ENCOUNTER — Ambulatory Visit: Payer: 59 | Attending: Student | Admitting: Speech Pathology

## 2023-03-08 DIAGNOSIS — R4701 Aphasia: Secondary | ICD-10-CM | POA: Diagnosis not present

## 2023-03-08 NOTE — Therapy (Signed)
OUTPATIENT SPEECH LANGUAGE PATHOLOGY TREATMENT NOTE   Patient Name: Peter Lucas MRN: [id number] DOB:November 10, 1966, 57 y.o., male Today's Date: 03/08/2023  PCP: Wayland Denis, PA REFERRING PROVIDER: Wayland Denis, PA  END OF SESSION:   End of Session - 03/08/23 1123     Visit Number 4    Number of Visits 10    Date for SLP Re-Evaluation 05/02/23    Authorization Type [employer]    Authorization - Visit Number 4    Authorization - Number of Visits 30    Progress Note Due on Visit 10    SLP Start Time 1100    SLP Stop Time  1120    SLP Time Calculation (min) 20 min    Activity Tolerance Patient tolerated treatment well             Past Medical History:  Diagnosis Date   DM (diabetes mellitus) ([place])    Hyperlipidemia    Hypertension    Past Surgical History:  Procedure Laterality Date   BACK SURGERY     cyst removal   IR CT HEAD LTD  05/07/2021   IR PERCUTANEOUS ART THROMBECTOMY/INFUSION INTRACRANIAL INC DIAG ANGIO  05/07/2021   RADIOLOGY WITH ANESTHESIA N/A 05/07/2021   Procedure: IR WITH ANESTHESIA;  Surgeon: Radiologist, Medication, MD;  Location: [place];  Service: Radiology;  Laterality: N/A;   Patient Active Problem List   Diagnosis Date Noted   Aphasia as late effect of stroke 09/15/2022   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 05/12/2021   Stroke 05/07/2021   Acute ischemic left MCA stroke 05/07/2021   Middle cerebral artery embolism, left 05/07/2021    ONSET DATE: 05/07/2022    REFERRING DIAG:  R47.01 (ICD-10-CM) - Aphasia  I69.320 (ICD-10-CM) - Aphasia following cerebral infarction  I63.312 (ICD-10-CM) - Cerebral infarction due to thrombosis of left middle cerebral artery      PERTINENT HISTORY: Peter Lucas is a 56 year old right-handed male with unremarkable past medical history. Presented 05/07/2021 with acute onset of aphasia and right side weakness. Large, confluent acute posterior left MCA territory infarct with  smaller acute infarcts scattered throughout the left frontotemporal lobes and left insular region. Associated edema and regional mass effect without midline shift. Areas of petechial hemorrhage along the posteromedial aspect of the confluent hemorrhage. Patient underwent thrombectomy per interventional radiology. Latest cranial CT scan showed more pronounced low density within the left MCA branch vessel infarction. CIR 05/13/2021 thru 05/26/2021.    Pt received Outpatient ST services from 08/10/2021 thru 07/10/2022.    DIAGNOSTIC FINDINGS:    01/31/2022 MRI Prior multifocal left MCA infarct. Confluent encephalomalacia in the posterior left MCA territory affecting most of the left parietal lobe and some portions of the left occipital lobe in the MCA/PCA watershed area. Some associated hemosiderin. And associated laminar necrosis, which is felt to explain the presence of some residual trace diffusion abnormality within the encephalomalacia.  THERAPY DIAG:  Aphasia  Rationale for Evaluation and Treatment Rehabilitation  SUBJECTIVE: pt pleasant, accompanied by sister.   Pt accompanied by: family member  PAIN:  Are you having pain? No  PATIENT GOALS: to be able to communicate better  OBJECTIVE:   TODAY'S TREATMENT: Skilled treatment session focused on pt's expressive and receptive language deficits:  Pt reported frustration at lack of ability to express himself as he knows what he wants to say during conversational but "can't get it out."  SLP facilitated conversation regarding pt currently at chronic baseline c/b moderate predominate  Wernicke's aphasia. Further skilled ST intervention is not likely to change pt's functional language abilities. Pt voiced understanding.   In an effort to promote language use, SLP continued providing verbal information about the [employer] (TAP). After further discussion in which SLP benefit of continuing language use to maintain language  abilities, pt willing to try TAP as therapy approach.  SLP further made plan to use future ST session for registering and attending virtual classes to teach patient about the program.    SLP will also reach out to pt's daughter to ensure understanding of the above plan.   PATIENT EDUCATION: Education details: see above Person educated: Patient and pt's sister ([name]) Education method: Explanation Education comprehension: needs further education  HOME EXERCISE PROGRAM:  Give information to his daughter on TAP   GOALS: Goals reviewed with patient? Yes   SHORT TERM GOALS: Target date: 10 sessions   Pt will respond to general questions using AAC/nonverbal communication effectively with _50_% accuracy and max cues. Baseline: Goal status: INITIAL   2.  Pt will point to the given picture in a choice of 3 with _50_% accuracy given minimal verbal, visual and tactile cues.   Baseline:  Goal status: INITIAL   3.  Pt will use AAC to communicate within group situations with _50_% accuracy given moderate cues.   Baseline:  Goal status: INITIAL   4.  Pt will use AAC to communicate in a structured conversation setting with _50__% accuracy and mod cues.     Baseline:  Goal status: INITIAL     LONG TERM GOALS: Target date: 05/02/2023   Pt will use multi-modal communication to communicate semi-complex wants/needs.  Baseline:  Goal status: INITIAL  ASSESSMENT:  CLINICAL IMPRESSION: Pt presents with what is now considered chronic aphasia and structured instruction in receptive/expressive tasks is not likely to have a functional change in language abilities. Pt's language impairment is such that using a low-tech or high-tech device is more frustrating than helpful. Will continue recommending additional services to promote services such as TAP. See above.   OBJECTIVE IMPAIRMENTS include expressive language, receptive language, aphasia, and apraxia. These impairments are limiting patient  from return to work, managing appointments, household responsibilities, ADLs/IADLs, and effectively communicating at home and in community. Factors affecting potential to achieve goals and functional outcome are severity of impairments and time post onset . Patient will benefit from skilled SLP services to address above impairments and improve overall function.  REHAB POTENTIAL: Good  PLAN: SLP FREQUENCY: 1-2x/week  SLP DURATION: 12 weeks  PLANNED INTERVENTIONS: Language facilitation, Environmental controls, Multimodal communication approach, SLP instruction and feedback, and Patient/family education    Alphonzo Grieve, SLP Graduate Clinician    Happi B. Rutherford Nail, M.S., [name], Mining engineer Certified Brain Injury [place]  [place] Office [phone] Ascom [phone] Fax [phone]

## 2023-03-13 ENCOUNTER — Ambulatory Visit: Payer: 59 | Admitting: Speech Pathology

## 2023-03-22 ENCOUNTER — Ambulatory Visit: Payer: 59 | Admitting: Speech Pathology

## 2023-03-27 ENCOUNTER — Ambulatory Visit: Payer: 59 | Admitting: Speech Pathology

## 2023-04-04 ENCOUNTER — Ambulatory Visit: Payer: 59 | Admitting: Speech Pathology

## 2023-04-10 ENCOUNTER — Ambulatory Visit: Payer: 59 | Admitting: Speech Pathology

## 2023-04-10 DIAGNOSIS — E782 Mixed hyperlipidemia: Secondary | ICD-10-CM | POA: Diagnosis not present

## 2023-04-10 DIAGNOSIS — N1831 Chronic kidney disease, stage 3a: Secondary | ICD-10-CM | POA: Diagnosis not present

## 2023-04-10 DIAGNOSIS — R809 Proteinuria, unspecified: Secondary | ICD-10-CM | POA: Diagnosis not present

## 2023-04-10 DIAGNOSIS — E1122 Type 2 diabetes mellitus with diabetic chronic kidney disease: Secondary | ICD-10-CM | POA: Diagnosis not present

## 2023-04-10 DIAGNOSIS — E1129 Type 2 diabetes mellitus with other diabetic kidney complication: Secondary | ICD-10-CM | POA: Diagnosis not present

## 2023-04-11 ENCOUNTER — Encounter: Payer: 59 | Admitting: Speech Pathology

## 2023-04-16 DIAGNOSIS — E782 Mixed hyperlipidemia: Secondary | ICD-10-CM | POA: Diagnosis not present

## 2023-04-16 DIAGNOSIS — E1122 Type 2 diabetes mellitus with diabetic chronic kidney disease: Secondary | ICD-10-CM | POA: Diagnosis not present

## 2023-04-16 DIAGNOSIS — G4733 Obstructive sleep apnea (adult) (pediatric): Secondary | ICD-10-CM | POA: Diagnosis not present

## 2023-04-16 DIAGNOSIS — I63512 Cerebral infarction due to unspecified occlusion or stenosis of left middle cerebral artery: Secondary | ICD-10-CM | POA: Diagnosis not present

## 2023-04-16 DIAGNOSIS — E1129 Type 2 diabetes mellitus with other diabetic kidney complication: Secondary | ICD-10-CM | POA: Diagnosis not present

## 2023-04-16 DIAGNOSIS — I69320 Aphasia following cerebral infarction: Secondary | ICD-10-CM | POA: Diagnosis not present

## 2023-04-16 DIAGNOSIS — J439 Emphysema, unspecified: Secondary | ICD-10-CM | POA: Diagnosis not present

## 2023-04-16 DIAGNOSIS — Z87891 Personal history of nicotine dependence: Secondary | ICD-10-CM | POA: Diagnosis not present

## 2023-04-16 DIAGNOSIS — N179 Acute kidney failure, unspecified: Secondary | ICD-10-CM | POA: Diagnosis not present

## 2023-04-16 DIAGNOSIS — I10 Essential (primary) hypertension: Secondary | ICD-10-CM | POA: Diagnosis not present

## 2023-04-16 DIAGNOSIS — I63312 Cerebral infarction due to thrombosis of left middle cerebral artery: Secondary | ICD-10-CM | POA: Diagnosis not present

## 2023-04-19 ENCOUNTER — Encounter: Payer: 59 | Admitting: Speech Pathology

## 2023-04-19 ENCOUNTER — Ambulatory Visit: Payer: 59 | Admitting: Speech Pathology

## 2023-04-24 ENCOUNTER — Encounter: Payer: 59 | Admitting: Speech Pathology

## 2023-04-26 ENCOUNTER — Ambulatory Visit: Payer: 59 | Admitting: Speech Pathology

## 2023-05-02 ENCOUNTER — Encounter: Payer: 59 | Admitting: Speech Pathology

## 2023-05-03 ENCOUNTER — Ambulatory Visit: Payer: 59 | Admitting: Speech Pathology

## 2023-05-08 ENCOUNTER — Encounter: Payer: 59 | Admitting: Speech Pathology

## 2023-05-10 ENCOUNTER — Ambulatory Visit: Payer: 59 | Admitting: Speech Pathology

## 2023-05-15 ENCOUNTER — Encounter: Payer: 59 | Admitting: Speech Pathology

## 2023-05-15 DIAGNOSIS — Z0271 Encounter for disability determination: Secondary | ICD-10-CM

## 2023-05-22 ENCOUNTER — Encounter: Payer: 59 | Admitting: Speech Pathology

## 2023-05-29 ENCOUNTER — Encounter: Payer: 59 | Admitting: Speech Pathology

## 2023-06-05 ENCOUNTER — Encounter: Payer: 59 | Admitting: Speech Pathology

## 2023-06-12 ENCOUNTER — Encounter: Payer: 59 | Admitting: Speech Pathology

## 2023-06-19 ENCOUNTER — Encounter: Payer: 59 | Admitting: Speech Pathology

## 2023-06-26 ENCOUNTER — Encounter: Payer: 59 | Admitting: Speech Pathology

## 2023-07-09 DIAGNOSIS — E782 Mixed hyperlipidemia: Secondary | ICD-10-CM | POA: Diagnosis not present

## 2023-07-09 DIAGNOSIS — N1831 Chronic kidney disease, stage 3a: Secondary | ICD-10-CM | POA: Diagnosis not present

## 2023-07-09 DIAGNOSIS — E1122 Type 2 diabetes mellitus with diabetic chronic kidney disease: Secondary | ICD-10-CM | POA: Diagnosis not present

## 2023-07-09 DIAGNOSIS — I10 Essential (primary) hypertension: Secondary | ICD-10-CM | POA: Diagnosis not present

## 2023-07-09 DIAGNOSIS — E1129 Type 2 diabetes mellitus with other diabetic kidney complication: Secondary | ICD-10-CM | POA: Diagnosis not present

## 2023-07-09 DIAGNOSIS — R809 Proteinuria, unspecified: Secondary | ICD-10-CM | POA: Diagnosis not present

## 2023-07-18 DIAGNOSIS — E1129 Type 2 diabetes mellitus with other diabetic kidney complication: Secondary | ICD-10-CM | POA: Diagnosis not present

## 2023-07-18 DIAGNOSIS — I10 Essential (primary) hypertension: Secondary | ICD-10-CM | POA: Diagnosis not present

## 2023-07-18 DIAGNOSIS — I63312 Cerebral infarction due to thrombosis of left middle cerebral artery: Secondary | ICD-10-CM | POA: Diagnosis not present

## 2023-07-18 DIAGNOSIS — I69320 Aphasia following cerebral infarction: Secondary | ICD-10-CM | POA: Diagnosis not present

## 2023-07-18 DIAGNOSIS — J439 Emphysema, unspecified: Secondary | ICD-10-CM | POA: Diagnosis not present

## 2023-07-18 DIAGNOSIS — N1831 Chronic kidney disease, stage 3a: Secondary | ICD-10-CM | POA: Diagnosis not present

## 2023-07-18 DIAGNOSIS — E1122 Type 2 diabetes mellitus with diabetic chronic kidney disease: Secondary | ICD-10-CM | POA: Diagnosis not present

## 2023-07-18 DIAGNOSIS — E782 Mixed hyperlipidemia: Secondary | ICD-10-CM | POA: Diagnosis not present

## 2023-07-18 DIAGNOSIS — Z87891 Personal history of nicotine dependence: Secondary | ICD-10-CM | POA: Diagnosis not present

## 2023-07-18 DIAGNOSIS — I63512 Cerebral infarction due to unspecified occlusion or stenosis of left middle cerebral artery: Secondary | ICD-10-CM | POA: Diagnosis not present

## 2023-07-18 DIAGNOSIS — G4733 Obstructive sleep apnea (adult) (pediatric): Secondary | ICD-10-CM | POA: Diagnosis not present

## 2023-08-30 DIAGNOSIS — I4891 Unspecified atrial fibrillation: Secondary | ICD-10-CM | POA: Diagnosis not present

## 2023-08-30 DIAGNOSIS — E782 Mixed hyperlipidemia: Secondary | ICD-10-CM | POA: Diagnosis not present

## 2023-08-30 DIAGNOSIS — I63512 Cerebral infarction due to unspecified occlusion or stenosis of left middle cerebral artery: Secondary | ICD-10-CM | POA: Diagnosis not present

## 2023-08-30 DIAGNOSIS — E119 Type 2 diabetes mellitus without complications: Secondary | ICD-10-CM | POA: Diagnosis not present

## 2023-08-30 DIAGNOSIS — I69320 Aphasia following cerebral infarction: Secondary | ICD-10-CM | POA: Diagnosis not present

## 2023-08-30 DIAGNOSIS — G4733 Obstructive sleep apnea (adult) (pediatric): Secondary | ICD-10-CM | POA: Diagnosis not present

## 2023-08-30 DIAGNOSIS — I10 Essential (primary) hypertension: Secondary | ICD-10-CM | POA: Diagnosis not present

## 2023-08-30 DIAGNOSIS — Z87891 Personal history of nicotine dependence: Secondary | ICD-10-CM | POA: Diagnosis not present

## 2023-08-30 DIAGNOSIS — I63312 Cerebral infarction due to thrombosis of left middle cerebral artery: Secondary | ICD-10-CM | POA: Diagnosis not present

## 2023-10-19 DIAGNOSIS — E1122 Type 2 diabetes mellitus with diabetic chronic kidney disease: Secondary | ICD-10-CM | POA: Diagnosis not present

## 2023-10-19 DIAGNOSIS — I69320 Aphasia following cerebral infarction: Secondary | ICD-10-CM | POA: Diagnosis not present

## 2023-10-19 DIAGNOSIS — G4733 Obstructive sleep apnea (adult) (pediatric): Secondary | ICD-10-CM | POA: Diagnosis not present

## 2023-10-19 DIAGNOSIS — J439 Emphysema, unspecified: Secondary | ICD-10-CM | POA: Diagnosis not present

## 2023-10-19 DIAGNOSIS — I63312 Cerebral infarction due to thrombosis of left middle cerebral artery: Secondary | ICD-10-CM | POA: Diagnosis not present

## 2023-10-19 DIAGNOSIS — Z87891 Personal history of nicotine dependence: Secondary | ICD-10-CM | POA: Diagnosis not present

## 2023-10-19 DIAGNOSIS — E1129 Type 2 diabetes mellitus with other diabetic kidney complication: Secondary | ICD-10-CM | POA: Diagnosis not present

## 2023-10-19 DIAGNOSIS — N1831 Chronic kidney disease, stage 3a: Secondary | ICD-10-CM | POA: Diagnosis not present

## 2023-10-19 DIAGNOSIS — Z23 Encounter for immunization: Secondary | ICD-10-CM | POA: Diagnosis not present

## 2023-10-19 DIAGNOSIS — I63512 Cerebral infarction due to unspecified occlusion or stenosis of left middle cerebral artery: Secondary | ICD-10-CM | POA: Diagnosis not present

## 2023-10-19 DIAGNOSIS — I10 Essential (primary) hypertension: Secondary | ICD-10-CM | POA: Diagnosis not present

## 2023-10-19 DIAGNOSIS — E782 Mixed hyperlipidemia: Secondary | ICD-10-CM | POA: Diagnosis not present

## 2024-03-10 DIAGNOSIS — E119 Type 2 diabetes mellitus without complications: Secondary | ICD-10-CM | POA: Diagnosis not present

## 2024-04-03 DIAGNOSIS — I10 Essential (primary) hypertension: Secondary | ICD-10-CM | POA: Diagnosis not present

## 2024-04-03 DIAGNOSIS — E1122 Type 2 diabetes mellitus with diabetic chronic kidney disease: Secondary | ICD-10-CM | POA: Diagnosis not present

## 2024-04-03 DIAGNOSIS — N1831 Chronic kidney disease, stage 3a: Secondary | ICD-10-CM | POA: Diagnosis not present

## 2024-04-03 DIAGNOSIS — E1129 Type 2 diabetes mellitus with other diabetic kidney complication: Secondary | ICD-10-CM | POA: Diagnosis not present

## 2024-04-03 DIAGNOSIS — R809 Proteinuria, unspecified: Secondary | ICD-10-CM | POA: Diagnosis not present

## 2024-04-03 DIAGNOSIS — E782 Mixed hyperlipidemia: Secondary | ICD-10-CM | POA: Diagnosis not present

## 2024-04-10 DIAGNOSIS — I10 Essential (primary) hypertension: Secondary | ICD-10-CM | POA: Diagnosis not present

## 2024-04-10 DIAGNOSIS — Z0000 Encounter for general adult medical examination without abnormal findings: Secondary | ICD-10-CM | POA: Diagnosis not present

## 2024-04-10 DIAGNOSIS — I69320 Aphasia following cerebral infarction: Secondary | ICD-10-CM | POA: Diagnosis not present

## 2024-04-10 DIAGNOSIS — J439 Emphysema, unspecified: Secondary | ICD-10-CM | POA: Diagnosis not present

## 2024-04-10 DIAGNOSIS — Z87891 Personal history of nicotine dependence: Secondary | ICD-10-CM | POA: Diagnosis not present

## 2024-04-10 DIAGNOSIS — Z1211 Encounter for screening for malignant neoplasm of colon: Secondary | ICD-10-CM | POA: Diagnosis not present

## 2024-04-10 DIAGNOSIS — E1122 Type 2 diabetes mellitus with diabetic chronic kidney disease: Secondary | ICD-10-CM | POA: Diagnosis not present

## 2024-04-10 DIAGNOSIS — E1129 Type 2 diabetes mellitus with other diabetic kidney complication: Secondary | ICD-10-CM | POA: Diagnosis not present

## 2024-04-10 DIAGNOSIS — E782 Mixed hyperlipidemia: Secondary | ICD-10-CM | POA: Diagnosis not present

## 2024-04-10 DIAGNOSIS — I63312 Cerebral infarction due to thrombosis of left middle cerebral artery: Secondary | ICD-10-CM | POA: Diagnosis not present

## 2024-04-10 DIAGNOSIS — G4733 Obstructive sleep apnea (adult) (pediatric): Secondary | ICD-10-CM | POA: Diagnosis not present

## 2024-04-10 DIAGNOSIS — Z1331 Encounter for screening for depression: Secondary | ICD-10-CM | POA: Diagnosis not present

## 2024-07-10 DIAGNOSIS — G4733 Obstructive sleep apnea (adult) (pediatric): Secondary | ICD-10-CM | POA: Diagnosis not present

## 2024-07-10 DIAGNOSIS — Z87891 Personal history of nicotine dependence: Secondary | ICD-10-CM | POA: Diagnosis not present

## 2024-07-10 DIAGNOSIS — R809 Proteinuria, unspecified: Secondary | ICD-10-CM | POA: Diagnosis not present

## 2024-07-10 DIAGNOSIS — Z125 Encounter for screening for malignant neoplasm of prostate: Secondary | ICD-10-CM | POA: Diagnosis not present

## 2024-07-10 DIAGNOSIS — E1129 Type 2 diabetes mellitus with other diabetic kidney complication: Secondary | ICD-10-CM | POA: Diagnosis not present

## 2024-07-10 DIAGNOSIS — I10 Essential (primary) hypertension: Secondary | ICD-10-CM | POA: Diagnosis not present

## 2024-07-10 DIAGNOSIS — N1831 Chronic kidney disease, stage 3a: Secondary | ICD-10-CM | POA: Diagnosis not present

## 2024-07-10 DIAGNOSIS — E782 Mixed hyperlipidemia: Secondary | ICD-10-CM | POA: Diagnosis not present

## 2024-07-10 DIAGNOSIS — I69320 Aphasia following cerebral infarction: Secondary | ICD-10-CM | POA: Diagnosis not present

## 2024-07-10 DIAGNOSIS — I63312 Cerebral infarction due to thrombosis of left middle cerebral artery: Secondary | ICD-10-CM | POA: Diagnosis not present

## 2024-07-10 DIAGNOSIS — J439 Emphysema, unspecified: Secondary | ICD-10-CM | POA: Diagnosis not present

## 2024-07-10 DIAGNOSIS — E1122 Type 2 diabetes mellitus with diabetic chronic kidney disease: Secondary | ICD-10-CM | POA: Diagnosis not present

## 2024-08-13 DIAGNOSIS — E782 Mixed hyperlipidemia: Secondary | ICD-10-CM | POA: Diagnosis not present

## 2024-08-13 DIAGNOSIS — I10 Essential (primary) hypertension: Secondary | ICD-10-CM | POA: Diagnosis not present

## 2024-08-13 DIAGNOSIS — E119 Type 2 diabetes mellitus without complications: Secondary | ICD-10-CM | POA: Diagnosis not present

## 2024-08-13 DIAGNOSIS — Z87891 Personal history of nicotine dependence: Secondary | ICD-10-CM | POA: Diagnosis not present

## 2024-08-13 DIAGNOSIS — G4733 Obstructive sleep apnea (adult) (pediatric): Secondary | ICD-10-CM | POA: Diagnosis not present

## 2024-08-13 DIAGNOSIS — I63312 Cerebral infarction due to thrombosis of left middle cerebral artery: Secondary | ICD-10-CM | POA: Diagnosis not present

## 2024-10-13 DIAGNOSIS — E782 Mixed hyperlipidemia: Secondary | ICD-10-CM | POA: Diagnosis not present

## 2024-10-13 DIAGNOSIS — E1122 Type 2 diabetes mellitus with diabetic chronic kidney disease: Secondary | ICD-10-CM | POA: Diagnosis not present

## 2024-10-13 DIAGNOSIS — Z87891 Personal history of nicotine dependence: Secondary | ICD-10-CM | POA: Diagnosis not present

## 2024-10-13 DIAGNOSIS — I63312 Cerebral infarction due to thrombosis of left middle cerebral artery: Secondary | ICD-10-CM | POA: Diagnosis not present

## 2024-10-13 DIAGNOSIS — Z23 Encounter for immunization: Secondary | ICD-10-CM | POA: Diagnosis not present

## 2024-10-13 DIAGNOSIS — I10 Essential (primary) hypertension: Secondary | ICD-10-CM | POA: Diagnosis not present

## 2024-10-13 DIAGNOSIS — E1129 Type 2 diabetes mellitus with other diabetic kidney complication: Secondary | ICD-10-CM | POA: Diagnosis not present

## 2024-10-13 DIAGNOSIS — R809 Proteinuria, unspecified: Secondary | ICD-10-CM | POA: Diagnosis not present

## 2024-10-13 DIAGNOSIS — I69320 Aphasia following cerebral infarction: Secondary | ICD-10-CM | POA: Diagnosis not present

## 2024-10-13 DIAGNOSIS — N1831 Chronic kidney disease, stage 3a: Secondary | ICD-10-CM | POA: Diagnosis not present

## 2024-10-13 DIAGNOSIS — G4733 Obstructive sleep apnea (adult) (pediatric): Secondary | ICD-10-CM | POA: Diagnosis not present

## 2024-10-17 DIAGNOSIS — Z01818 Encounter for other preprocedural examination: Secondary | ICD-10-CM | POA: Diagnosis not present

## 2024-10-17 DIAGNOSIS — Z1211 Encounter for screening for malignant neoplasm of colon: Secondary | ICD-10-CM | POA: Diagnosis not present

## 2024-10-29 ENCOUNTER — Encounter: Payer: Self-pay | Admitting: *Deleted

## 2024-11-14 ENCOUNTER — Ambulatory Visit: Admitting: Anesthesiology

## 2024-11-14 ENCOUNTER — Ambulatory Visit
Admission: RE | Admit: 2024-11-14 | Discharge: 2024-11-14 | Disposition: A | Attending: Gastroenterology | Admitting: Gastroenterology

## 2024-11-14 ENCOUNTER — Encounter: Admission: RE | Disposition: A | Payer: Self-pay | Source: Home / Self Care | Attending: Gastroenterology

## 2024-11-14 DIAGNOSIS — Z7984 Long term (current) use of oral hypoglycemic drugs: Secondary | ICD-10-CM | POA: Diagnosis not present

## 2024-11-14 DIAGNOSIS — I10 Essential (primary) hypertension: Secondary | ICD-10-CM | POA: Diagnosis not present

## 2024-11-14 DIAGNOSIS — K5730 Diverticulosis of large intestine without perforation or abscess without bleeding: Secondary | ICD-10-CM | POA: Diagnosis not present

## 2024-11-14 DIAGNOSIS — D128 Benign neoplasm of rectum: Secondary | ICD-10-CM | POA: Diagnosis not present

## 2024-11-14 DIAGNOSIS — D123 Benign neoplasm of transverse colon: Secondary | ICD-10-CM | POA: Diagnosis not present

## 2024-11-14 DIAGNOSIS — K635 Polyp of colon: Secondary | ICD-10-CM | POA: Diagnosis not present

## 2024-11-14 DIAGNOSIS — R197 Diarrhea, unspecified: Secondary | ICD-10-CM | POA: Diagnosis not present

## 2024-11-14 DIAGNOSIS — G4733 Obstructive sleep apnea (adult) (pediatric): Secondary | ICD-10-CM | POA: Diagnosis not present

## 2024-11-14 DIAGNOSIS — Z87891 Personal history of nicotine dependence: Secondary | ICD-10-CM | POA: Diagnosis not present

## 2024-11-14 DIAGNOSIS — K640 First degree hemorrhoids: Secondary | ICD-10-CM | POA: Diagnosis not present

## 2024-11-14 DIAGNOSIS — D124 Benign neoplasm of descending colon: Secondary | ICD-10-CM | POA: Diagnosis not present

## 2024-11-14 DIAGNOSIS — Z1211 Encounter for screening for malignant neoplasm of colon: Secondary | ICD-10-CM | POA: Diagnosis not present

## 2024-11-14 DIAGNOSIS — E785 Hyperlipidemia, unspecified: Secondary | ICD-10-CM | POA: Diagnosis not present

## 2024-11-14 DIAGNOSIS — K6389 Other specified diseases of intestine: Secondary | ICD-10-CM | POA: Diagnosis not present

## 2024-11-14 DIAGNOSIS — I69320 Aphasia following cerebral infarction: Secondary | ICD-10-CM | POA: Diagnosis not present

## 2024-11-14 DIAGNOSIS — E119 Type 2 diabetes mellitus without complications: Secondary | ICD-10-CM | POA: Diagnosis not present

## 2024-11-14 HISTORY — DX: Aphasia following cerebral infarction: I69.320

## 2024-11-14 HISTORY — PX: COLONOSCOPY: SHX5424

## 2024-11-14 HISTORY — DX: Emphysema, unspecified: J43.9

## 2024-11-14 HISTORY — PX: HEMOSTASIS CLIP PLACEMENT: SHX6857

## 2024-11-14 HISTORY — DX: Cerebral infarction due to unspecified occlusion or stenosis of left middle cerebral artery: I63.512

## 2024-11-14 HISTORY — PX: POLYPECTOMY: SHX149

## 2024-11-14 HISTORY — DX: Occlusion and stenosis of unspecified middle cerebral artery: I66.09

## 2024-11-14 HISTORY — DX: Sleep apnea, unspecified: G47.30

## 2024-11-14 LAB — GLUCOSE, CAPILLARY: Glucose-Capillary: 162 mg/dL — ABNORMAL HIGH (ref 70–99)

## 2024-11-14 SURGERY — COLONOSCOPY
Anesthesia: General

## 2024-11-14 MED ORDER — LIDOCAINE HCL (CARDIAC) PF 100 MG/5ML IV SOSY
PREFILLED_SYRINGE | INTRAVENOUS | Status: DC | PRN
  Administered 2024-11-14: 30 mg via INTRAVENOUS

## 2024-11-14 MED ORDER — PROPOFOL 10 MG/ML IV BOLUS
INTRAVENOUS | Status: DC | PRN
  Administered 2024-11-14: 100 mg via INTRAVENOUS
  Administered 2024-11-14: 30 mg via INTRAVENOUS
  Administered 2024-11-14 (×4): 20 mg via INTRAVENOUS
  Administered 2024-11-14: 40 mg via INTRAVENOUS
  Administered 2024-11-14: 20 mg via INTRAVENOUS
  Administered 2024-11-14: 30 mg via INTRAVENOUS
  Administered 2024-11-14: 20 mg via INTRAVENOUS
  Administered 2024-11-14 (×2): 30 mg via INTRAVENOUS
  Administered 2024-11-14 (×2): 20 mg via INTRAVENOUS

## 2024-11-14 MED ORDER — SODIUM CHLORIDE 0.9 % IV SOLN: INTRAVENOUS | Status: DC

## 2024-11-14 NOTE — Op Note (Signed)
 [facility] Patient Name: Peter Lucas Procedure Date: 11/14/2024 8:45 AM MRN: [id number] Account #: [id number] Date of Birth: [birth year] Admit Type: Outpatient Age: 58 Room: [facility] ENDO ROOM 3 Gender: Male Note Status: Finalized Instrument Name: Colon Scope [id number] Procedure:             Colonoscopy Indications:           Screening for colorectal malignant neoplasm Providers:             Ole Schick MD, MD Referring MD:          Edsel Pepper (Referring MD) Medicines:             Monitored Anesthesia Care Complications:         No immediate complications. Estimated blood loss:                         Minimal. Procedure:             Pre-Anesthesia Assessment:                        - Prior to the procedure, a History and Physical was                         performed, and patient medications and allergies were                         reviewed. The patient is competent. The risks and                         benefits of the procedure and the sedation options and                         risks were discussed with the patient. All questions                         were answered and informed consent was obtained.                         Patient identification and proposed procedure were                         verified by the physician, the nurse, the                         anesthesiologist, the anesthetist and the technician                         in the endoscopy suite. Mental Status Examination:                         alert and oriented. Airway Examination: normal                         oropharyngeal airway and neck mobility. Respiratory                         Examination: clear to auscultation. CV Examination:  normal. Prophylactic Antibiotics: The patient does not                         require prophylactic antibiotics. Prior                         Anticoagulants: The patient has taken no anticoagulant                          or antiplatelet agents. ASA Grade Assessment: III - A                         patient with severe systemic disease. After reviewing                         the risks and benefits, the patient was deemed in                         satisfactory condition to undergo the procedure. The                         anesthesia plan was to use monitored anesthesia care                         (MAC). Immediately prior to administration of                         medications, the patient was re-assessed for adequacy                         to receive sedatives. The heart rate, respiratory                         rate, oxygen saturations, blood pressure, adequacy of                         pulmonary ventilation, and response to care were                         monitored throughout the procedure. The physical                         status of the patient was re-assessed after the                         procedure.                        After obtaining informed consent, the colonoscope was                         passed under direct vision. Throughout the procedure,                         the patient's blood pressure, pulse, and oxygen                         saturations were monitored continuously. The  Colonoscope was introduced through the anus and                         advanced to the the terminal ileum, with                         identification of the appendiceal orifice and IC                         valve. The colonoscopy was performed without                         difficulty. The patient tolerated the procedure well.                         The quality of the bowel preparation was good. The                         terminal ileum, ileocecal valve, appendiceal orifice,                         and rectum were photographed. Findings:      The perianal and digital rectal examinations were normal.      The terminal ileum appeared normal.      The ileocecal  valve was moderately lipomatous. Biopsies were taken with       a cold forceps for histology. Estimated blood loss was minimal.      Four sessile polyps were found in the transverse colon. The polyps were       2 to 4 mm in size. These polyps were removed with a cold snare.       Resection and retrieval were complete. Estimated blood loss was minimal.      Two sessile polyps were found in the descending colon. The polyps were 3       to 4 mm in size. These polyps were removed with a cold snare. Resection       and retrieval were complete. Estimated blood loss was minimal.      A few small-mouthed diverticula were found in the sigmoid colon.      A 15 mm polyp was found in the mid rectum. The polyp was       semi-pedunculated. The polyp was removed with a hot snare. Resection and       retrieval were complete. To prevent bleeding after the polypectomy, one       hemostatic clip was successfully placed. There was no bleeding during,       or at the end, of the procedure.      A 4 mm polyp was found in the rectum. The polyp was sessile. The polyp       was removed with a cold snare. Resection and retrieval were complete.       Estimated blood loss was minimal.      Internal hemorrhoids were found during retroflexion. The hemorrhoids       were Grade I (internal hemorrhoids that do not prolapse).      The exam was otherwise without abnormality on direct and retroflexion       views. Impression:            - The examined portion of the ileum was normal.                        -  Lipomatous ileocecal valve. Biopsied.                        - Four 2 to 4 mm polyps in the transverse colon,                         removed with a cold snare. Resected and retrieved.                        - Two 3 to 4 mm polyps in the descending colon,                         removed with a cold snare. Resected and retrieved.                        - Diverticulosis in the sigmoid colon.                        - One  15 mm polyp in the mid rectum, removed with a                         hot snare. Resected and retrieved. Clip was placed.                        - One 4 mm polyp in the rectum, removed with a cold                         snare. Resected and retrieved.                        - Internal hemorrhoids.                        - The examination was otherwise normal on direct and                         retroflexion views. Recommendation:        - Discharge patient to home.                        - Resume previous diet.                        - Continue present medications.                        - Await pathology results.                        - Repeat colonoscopy in 3 years for surveillance.                        - Return to referring physician as previously                         scheduled. Procedure Code(s):     --- Professional ---                        [phone], Colonoscopy, flexible; with removal of  tumor(s), polyp(s), or other lesion(s) by snare                         technique                        [id number], 59, Colonoscopy, flexible; with biopsy, single                         or multiple Diagnosis Code(s):     --- Professional ---                        Z12.11, Encounter for screening for malignant neoplasm                         of colon                        K64.0, First degree hemorrhoids                        K63.89, Other specified diseases of intestine                        D12.3, Benign neoplasm of transverse colon (hepatic                         flexure or splenic flexure)                        D12.4, Benign neoplasm of descending colon                        D12.8, Benign neoplasm of rectum                        K57.30, Diverticulosis of large intestine without                         perforation or abscess without bleeding CPT copyright 2022 American Medical Association. All rights reserved. The codes documented in this report are  preliminary and upon coder review may  be revised to meet current compliance requirements. Ole Schick MD, MD 11/14/2024 9:40:30 AM Number of Addenda: 0 Note Initiated On: 11/14/2024 8:45 AM Scope Withdrawal Time: 0 hours 25 minutes 25 seconds  Total Procedure Duration: 0 hours 28 minutes 51 seconds  Estimated Blood Loss:  Estimated blood loss was minimal.      [facility]

## 2024-11-14 NOTE — Transfer of Care (Signed)
 Immediate Anesthesia Transfer of Care Note  Patient: Peter Lucas  Procedure(s) Performed: COLONOSCOPY POLYPECTOMY, INTESTINE CONTROL OF HEMORRHAGE, GI TRACT, ENDOSCOPIC, BY CLIPPING OR OVERSEWING  Patient Location: Endoscopy Unit  Anesthesia Type:General  Level of Consciousness: drowsy  Airway & Oxygen Therapy: Patient Spontanous Breathing  Post-op Assessment: Report given to RN and Post -op Vital signs reviewed and stable  Post vital signs: Reviewed and stable  Last Vitals:  Vitals Value Taken Time  BP 113/71 11/14/24 09:35  Temp 35.6 C 11/14/24 09:35  Pulse 77 11/14/24 09:35  Resp 11 11/14/24 09:35  SpO2 96 % 11/14/24 09:35    Last Pain:  Vitals:   11/14/24 0935  TempSrc: Temporal  PainSc: Asleep         Complications: No notable events documented.

## 2024-11-14 NOTE — H&P (Signed)
 Outpatient short stay form Pre-procedure 11/14/2024  Peter ONEIDA Schick, MD  Primary Physician: Franchot Houston, PA-C  Reason for visit:  Screening  History of present illness:    58 y/o gentleman with history of hypertension, HLD, OSA, and DM II here for screening colonoscopy. Never had a colonoscopy before. No blood thinners. No family history of GI malignancies. No abdominal surgeries.   Current Medications[1]  Medications Prior to Admission  Medication Sig Dispense Refill Last Dose/Taking   amLODipine  (NORVASC ) 10 MG tablet Take 1 tablet (10 mg total) by mouth daily. 30 tablet 0 11/13/2024 Morning   aspirin  EC 81 MG EC tablet Take 1 tablet (81 mg total) by mouth daily. Swallow whole.   11/13/2024 Morning   atorvastatin  (LIPITOR) 40 MG tablet Take 1 tablet (40 mg total) by mouth daily. 30 tablet 0 11/13/2024 Morning   cloNIDine  (CATAPRES ) 0.3 MG tablet Take 1 tablet (0.3 mg total) by mouth every 8 (eight) hours. 60 tablet 11 Past Month   glipiZIDE  (GLUCOTROL ) 5 MG tablet Take 1 tablet (5 mg total) by mouth daily before breakfast. 30 tablet 0 11/13/2024 Morning   metFORMIN (GLUCOPHAGE) 500 MG tablet Take 500 mg by mouth 2 (two) times daily with a meal.   11/13/2024 Morning   acetaminophen  (TYLENOL ) 325 MG tablet Take 2 tablets (650 mg total) by mouth every 4 (four) hours as needed for mild pain (or temp > 37.5 C (99.5 F)). (Patient taking differently: Take 650 mg by mouth as needed for mild pain (or temp > 37.5 C (99.5 F)).)        Allergies[2]   Past Medical History:  Diagnosis Date   Acute ischemic left MCA stroke ([facility])    Aphasia, post-stroke    DM (diabetes mellitus) ([facility])    Hyperlipidemia    Hypertension    Middle cerebral artery embolism    Pulmonary emphysema ([facility])    Sleep apnea     Review of systems:  Otherwise negative.    Physical Exam  Gen: Alert, oriented. Appears stated age.  HEENT: PERRLA. Lungs: No respiratory distress CV: RRR Abd: soft, benign,  no masses Ext: No edema    Planned procedures: Proceed with colonoscopy. The patient understands the nature of the planned procedure, indications, risks, alternatives and potential complications including but not limited to bleeding, infection, perforation, damage to internal organs and possible oversedation/side effects from anesthesia. The patient agrees and gives consent to proceed.  Please refer to procedure notes for findings, recommendations and patient disposition/instructions.     Peter ONEIDA Schick, MD [facility]         [1]  Current Facility-Administered Medications:    0.9 %  sodium chloride  infusion, , Intravenous, Continuous, Tyia Binford, Peter ONEIDA, MD [2] No Known Allergies

## 2024-11-14 NOTE — Interval H&P Note (Signed)
 History and Physical Interval Note:  11/14/2024 8:55 AM  Peter Lucas  has presented today for surgery, with the diagnosis of Colon cancer screening (Z12.11).  The various methods of treatment have been discussed with the patient and family. After consideration of risks, benefits and other options for treatment, the patient has consented to  Procedures: COLONOSCOPY (N/A) as a surgical intervention.  The patient's history has been reviewed, patient examined, no change in status, stable for surgery.  I have reviewed the patient's chart and labs.  Questions were answered to the patient's satisfaction.     Peter Lucas  Ok to proceed with colonoscopy

## 2024-11-14 NOTE — Anesthesia Preprocedure Evaluation (Signed)
 Anesthesia Evaluation  Patient identified by MRN, date of birth, ID band Patient awake and Patient confused    Reviewed: Allergy & Precautions, H&P , NPO status , Patient's Chart, lab work & pertinent test results, reviewed documented beta blocker date and time , Unable to perform ROS - Chart review onlyPreop documentation limited or incomplete due to emergent nature of procedure.  History of Anesthesia Complications Negative for: history of anesthetic complications  Airway Mallampati: III  TM Distance: >3 FB Neck ROM: Full    Dental  (+) Teeth Intact, Dental Advisory Given   Pulmonary neg shortness of breath, sleep apnea , COPD, neg recent URI, former smoker   Pulmonary exam normal breath sounds clear to auscultation       Cardiovascular Exercise Tolerance: Good hypertension, (-) angina (-) Past MI and (-) Cardiac Stents negative cardio ROS Normal cardiovascular exam(-) dysrhythmias (-) Valvular Problems/Murmurs Rhythm:regular Rate:Normal     Neuro/Psych neg Seizures CVA (aphasia), Residual Symptoms  negative psych ROS   GI/Hepatic negative GI ROS, Neg liver ROS,,,  Endo/Other  diabetes  Class 3 obesity  Renal/GU negative Renal ROS  negative genitourinary   Musculoskeletal   Abdominal  (+) + obese  Peds  Hematology negative hematology ROS (+)   Anesthesia Other Findings Past Medical History: No date: Acute ischemic left MCA stroke ([facility]CC) No date: Aphasia, post-stroke No date: DM (diabetes mellitus) ([facility]) No date: Hyperlipidemia No date: Hypertension No date: Middle cerebral artery embolism No date: Pulmonary emphysema (HCC) No date: Sleep apnea   Reproductive/Obstetrics negative OB ROS                              Anesthesia Physical Anesthesia Plan  ASA: 3  Anesthesia Plan: General   Post-op Pain Management:    Induction: Intravenous  PONV Risk Score and Plan: 2 and  Treatment may vary due to age or medical condition, Propofol  infusion and TIVA  Airway Management Planned: Natural Airway and Nasal Cannula  Additional Equipment:   Intra-op Plan:   Post-operative Plan:   Informed Consent: I have reviewed the patients History and Physical, chart, labs and discussed the procedure including the risks, benefits and alternatives for the proposed anesthesia with the patient or authorized representative who has indicated his/her understanding and acceptance.     History available from chart only and Only emergency history available  Plan Discussed with: CRNA  Anesthesia Plan Comments:          Anesthesia Quick Evaluation

## 2024-11-17 LAB — SURGICAL PATHOLOGY

## 2024-11-22 NOTE — Anesthesia Postprocedure Evaluation (Signed)
"   Anesthesia Post Note  Patient: Peter Lucas  Procedure(s) Performed: COLONOSCOPY POLYPECTOMY, INTESTINE CONTROL OF HEMORRHAGE, GI TRACT, ENDOSCOPIC, BY CLIPPING OR OVERSEWING  Patient location during evaluation: Endoscopy Anesthesia Type: General Level of consciousness: awake and alert Pain management: pain level controlled Vital Signs Assessment: post-procedure vital signs reviewed and stable Respiratory status: spontaneous breathing, nonlabored ventilation, respiratory function stable and patient connected to nasal cannula oxygen Cardiovascular status: blood pressure returned to baseline and stable Postop Assessment: no apparent nausea or vomiting Anesthetic complications: no   No notable events documented.   Last Vitals:  Vitals:   11/14/24 0945 11/14/24 1015  BP: 117/81 128/78  Pulse: 73   Resp: 14   Temp:    SpO2: 96% 98%    Last Pain:  Vitals:   11/14/24 0945  TempSrc:   PainSc: 0-No pain                 Prentice Murphy      "

## 2025-01-09 ENCOUNTER — Encounter: Payer: Self-pay | Admitting: *Deleted

## 2025-01-09 NOTE — Progress Notes (Signed)
 Peter Lucas                                          MRN: [id number]   01/09/2025   The [facility] Quality Team Specialist reviewed this patient medical record for the purposes of chart review for care gap closure. The following were reviewed: chart review for care gap closure-controlling blood pressure.    [facility] Quality Team
